# Patient Record
Sex: Female | Born: 1945 | Race: Black or African American | Hispanic: Refuse to answer | Smoking: Never smoker
Health system: Southern US, Community
[De-identification: ages and names within clinical notes are randomized; demographics above are authoritative.]

## PROBLEM LIST (undated history)

## (undated) DIAGNOSIS — R7303 Prediabetes: Secondary | ICD-10-CM

## (undated) DIAGNOSIS — I10 Essential (primary) hypertension: Secondary | ICD-10-CM

## (undated) DIAGNOSIS — M1990 Unspecified osteoarthritis, unspecified site: Secondary | ICD-10-CM

## (undated) DIAGNOSIS — E669 Obesity, unspecified: Secondary | ICD-10-CM

## (undated) DIAGNOSIS — E063 Autoimmune thyroiditis: Secondary | ICD-10-CM

## (undated) DIAGNOSIS — E785 Hyperlipidemia, unspecified: Secondary | ICD-10-CM

## (undated) DIAGNOSIS — E079 Disorder of thyroid, unspecified: Secondary | ICD-10-CM

## (undated) DIAGNOSIS — E039 Hypothyroidism, unspecified: Secondary | ICD-10-CM

## (undated) HISTORY — DX: Essential (primary) hypertension: I10

## (undated) HISTORY — DX: Unspecified osteoarthritis, unspecified site: M19.90

## (undated) HISTORY — DX: Hyperlipidemia, unspecified: E78.5

## (undated) HISTORY — DX: Obesity, unspecified: E66.9

## (undated) HISTORY — DX: Prediabetes: R73.03

## (undated) HISTORY — DX: Disorder of thyroid, unspecified: E07.9

## (undated) HISTORY — DX: Autoimmune thyroiditis: E06.3

---

## 1997-03-20 HISTORY — PX: OTHER SURGICAL HISTORY: SHX169

## 1998-03-20 HISTORY — PX: OTHER SURGICAL HISTORY: SHX169

## 2000-07-11 ENCOUNTER — Ambulatory Visit (HOSPITAL_COMMUNITY): Admission: RE | Admit: 2000-07-11 | Discharge: 2000-07-11 | Payer: Self-pay | Admitting: General Surgery

## 2001-02-19 ENCOUNTER — Other Ambulatory Visit: Admission: RE | Admit: 2001-02-19 | Discharge: 2001-02-19 | Payer: Self-pay | Admitting: Family Medicine

## 2001-02-26 ENCOUNTER — Encounter: Payer: Self-pay | Admitting: Family Medicine

## 2001-02-26 ENCOUNTER — Ambulatory Visit (HOSPITAL_COMMUNITY): Admission: RE | Admit: 2001-02-26 | Discharge: 2001-02-26 | Payer: Self-pay | Admitting: Family Medicine

## 2001-10-01 ENCOUNTER — Encounter: Payer: Self-pay | Admitting: Family Medicine

## 2001-10-01 ENCOUNTER — Ambulatory Visit (HOSPITAL_COMMUNITY): Admission: RE | Admit: 2001-10-01 | Discharge: 2001-10-01 | Payer: Self-pay | Admitting: Family Medicine

## 2002-03-04 ENCOUNTER — Encounter: Payer: Self-pay | Admitting: Family Medicine

## 2002-03-04 ENCOUNTER — Ambulatory Visit (HOSPITAL_COMMUNITY): Admission: RE | Admit: 2002-03-04 | Discharge: 2002-03-04 | Payer: Self-pay | Admitting: Family Medicine

## 2003-04-21 ENCOUNTER — Ambulatory Visit (HOSPITAL_COMMUNITY): Admission: RE | Admit: 2003-04-21 | Discharge: 2003-04-21 | Payer: Self-pay | Admitting: Family Medicine

## 2003-12-08 ENCOUNTER — Ambulatory Visit (HOSPITAL_COMMUNITY): Admission: RE | Admit: 2003-12-08 | Discharge: 2003-12-08 | Payer: Self-pay | Admitting: Family Medicine

## 2004-03-31 ENCOUNTER — Ambulatory Visit: Payer: Self-pay | Admitting: Family Medicine

## 2004-04-01 ENCOUNTER — Ambulatory Visit (HOSPITAL_COMMUNITY): Admission: RE | Admit: 2004-04-01 | Discharge: 2004-04-01 | Payer: Self-pay | Admitting: Family Medicine

## 2004-04-04 ENCOUNTER — Ambulatory Visit: Payer: Self-pay | Admitting: Family Medicine

## 2004-04-22 ENCOUNTER — Ambulatory Visit (HOSPITAL_COMMUNITY): Admission: RE | Admit: 2004-04-22 | Discharge: 2004-04-22 | Payer: Self-pay | Admitting: Family Medicine

## 2004-10-04 ENCOUNTER — Ambulatory Visit: Payer: Self-pay | Admitting: Family Medicine

## 2004-10-06 ENCOUNTER — Ambulatory Visit (HOSPITAL_COMMUNITY): Admission: RE | Admit: 2004-10-06 | Discharge: 2004-10-06 | Payer: Self-pay | Admitting: Family Medicine

## 2004-10-10 ENCOUNTER — Ambulatory Visit (HOSPITAL_COMMUNITY): Admission: RE | Admit: 2004-10-10 | Discharge: 2004-10-10 | Payer: Self-pay | Admitting: Family Medicine

## 2004-11-07 ENCOUNTER — Ambulatory Visit: Payer: Self-pay | Admitting: Family Medicine

## 2005-04-10 ENCOUNTER — Ambulatory Visit: Payer: Self-pay | Admitting: Family Medicine

## 2005-04-25 ENCOUNTER — Ambulatory Visit (HOSPITAL_COMMUNITY): Admission: RE | Admit: 2005-04-25 | Discharge: 2005-04-25 | Payer: Self-pay | Admitting: Family Medicine

## 2005-05-24 ENCOUNTER — Ambulatory Visit (HOSPITAL_COMMUNITY): Admission: RE | Admit: 2005-05-24 | Discharge: 2005-05-24 | Payer: Self-pay | Admitting: Family Medicine

## 2005-07-17 ENCOUNTER — Ambulatory Visit: Payer: Self-pay | Admitting: Family Medicine

## 2005-07-17 ENCOUNTER — Ambulatory Visit (HOSPITAL_COMMUNITY): Admission: RE | Admit: 2005-07-17 | Discharge: 2005-07-17 | Payer: Self-pay | Admitting: Family Medicine

## 2005-08-29 ENCOUNTER — Encounter (HOSPITAL_COMMUNITY): Admission: RE | Admit: 2005-08-29 | Discharge: 2005-09-28 | Payer: Self-pay | Admitting: Endocrinology

## 2005-10-06 ENCOUNTER — Ambulatory Visit: Payer: Self-pay | Admitting: Family Medicine

## 2005-11-10 ENCOUNTER — Ambulatory Visit: Payer: Self-pay | Admitting: Family Medicine

## 2006-01-24 ENCOUNTER — Ambulatory Visit: Payer: Self-pay | Admitting: Family Medicine

## 2006-05-23 ENCOUNTER — Ambulatory Visit (HOSPITAL_COMMUNITY): Admission: RE | Admit: 2006-05-23 | Discharge: 2006-05-23 | Payer: Self-pay | Admitting: Family Medicine

## 2006-05-30 ENCOUNTER — Encounter: Payer: Self-pay | Admitting: Family Medicine

## 2006-05-30 LAB — CONVERTED CEMR LAB
AST: 15 units/L (ref 0–37)
Alkaline Phosphatase: 185 units/L — ABNORMAL HIGH (ref 39–117)
BUN: 18 mg/dL (ref 6–23)
Bilirubin, Direct: 0.2 mg/dL (ref 0.0–0.3)
CO2: 25 meq/L (ref 19–32)
Calcium: 9.5 mg/dL (ref 8.4–10.5)
Chloride: 106 meq/L (ref 96–112)
Cholesterol: 183 mg/dL (ref 0–200)
Creatinine, Ser: 0.89 mg/dL (ref 0.40–1.50)
Eosinophils Absolute: 0.2 10*3/uL (ref 0.0–0.7)
Glucose, Bld: 87 mg/dL (ref 70–99)
Hemoglobin: 13.4 g/dL (ref 13.0–17.0)
Indirect Bilirubin: 0.7 mg/dL (ref 0.0–0.9)
Lymphocytes Relative: 48 % — ABNORMAL HIGH (ref 12–46)
Lymphs Abs: 2.5 10*3/uL (ref 0.7–3.3)
Monocytes Relative: 12 % — ABNORMAL HIGH (ref 3–11)
Neutro Abs: 2 10*3/uL (ref 1.7–7.7)
Platelets: 293 10*3/uL (ref 150–400)
RDW: 15.4 % — ABNORMAL HIGH (ref 11.5–14.0)
Total Bilirubin: 0.9 mg/dL (ref 0.3–1.2)
Total CHOL/HDL Ratio: 2.7
Total Protein: 6.7 g/dL (ref 6.0–8.3)
WBC: 5.3 10*3/uL (ref 4.0–10.5)

## 2006-06-06 ENCOUNTER — Other Ambulatory Visit: Admission: RE | Admit: 2006-06-06 | Discharge: 2006-06-06 | Payer: Self-pay | Admitting: Family Medicine

## 2006-06-06 ENCOUNTER — Ambulatory Visit: Payer: Self-pay | Admitting: Family Medicine

## 2006-06-13 ENCOUNTER — Ambulatory Visit (HOSPITAL_COMMUNITY): Admission: RE | Admit: 2006-06-13 | Discharge: 2006-06-13 | Payer: Self-pay | Admitting: Family Medicine

## 2006-11-21 ENCOUNTER — Encounter: Payer: Self-pay | Admitting: Family Medicine

## 2006-11-21 LAB — CONVERTED CEMR LAB
AST: 15 units/L (ref 0–37)
Albumin: 4 g/dL (ref 3.5–5.2)
Alkaline Phosphatase: 151 units/L — ABNORMAL HIGH (ref 39–117)
BUN: 17 mg/dL (ref 6–23)
CO2: 24 meq/L (ref 19–32)
Cholesterol: 183 mg/dL (ref 0–200)
Glucose, Bld: 78 mg/dL (ref 70–99)
HDL: 62 mg/dL (ref >39)
Indirect Bilirubin: 0.7 mg/dL (ref 0.0–0.9)
Total Protein: 6.9 g/dL (ref 6.0–8.3)
Triglycerides: 47 mg/dL (ref <150)
VLDL: 9 mg/dL (ref 0–40)

## 2006-11-28 ENCOUNTER — Ambulatory Visit: Payer: Self-pay | Admitting: Family Medicine

## 2007-01-23 ENCOUNTER — Ambulatory Visit: Payer: Self-pay | Admitting: Family Medicine

## 2007-03-21 ENCOUNTER — Encounter: Payer: Self-pay | Admitting: Family Medicine

## 2007-05-24 ENCOUNTER — Ambulatory Visit (HOSPITAL_COMMUNITY): Admission: RE | Admit: 2007-05-24 | Discharge: 2007-05-24 | Payer: Self-pay | Admitting: Family Medicine

## 2007-06-12 ENCOUNTER — Encounter: Payer: Self-pay | Admitting: Family Medicine

## 2007-06-12 LAB — CONVERTED CEMR LAB
Basophils Relative: 0 % (ref 0–1)
Bilirubin, Direct: 0.2 mg/dL (ref 0.0–0.3)
Calcium: 9.4 mg/dL (ref 8.4–10.5)
Creatinine, Ser: 1.01 mg/dL (ref 0.40–1.50)
Eosinophils Relative: 3 % (ref 0–5)
LDL Cholesterol: 107 mg/dL — ABNORMAL HIGH (ref 0–99)
Monocytes Absolute: 0.7 10*3/uL (ref 0.1–1.0)
Monocytes Relative: 14 % — ABNORMAL HIGH (ref 3–12)
Neutro Abs: 2.1 10*3/uL (ref 1.7–7.7)
Neutrophils Relative %: 40 % — ABNORMAL LOW (ref 43–77)
RBC: 4.95 M/uL (ref 4.22–5.81)
Total Bilirubin: 0.9 mg/dL (ref 0.3–1.2)
Total CHOL/HDL Ratio: 2.7
Triglycerides: 58 mg/dL (ref <150)
VLDL: 12 mg/dL (ref 0–40)
WBC: 5.2 10*3/uL (ref 4.0–10.5)

## 2007-06-19 ENCOUNTER — Ambulatory Visit: Payer: Self-pay | Admitting: Family Medicine

## 2007-08-26 ENCOUNTER — Ambulatory Visit: Payer: Self-pay | Admitting: Family Medicine

## 2007-08-30 ENCOUNTER — Encounter: Payer: Self-pay | Admitting: Family Medicine

## 2007-08-30 DIAGNOSIS — E079 Disorder of thyroid, unspecified: Secondary | ICD-10-CM | POA: Insufficient documentation

## 2007-08-30 DIAGNOSIS — I10 Essential (primary) hypertension: Secondary | ICD-10-CM | POA: Insufficient documentation

## 2007-08-30 DIAGNOSIS — E785 Hyperlipidemia, unspecified: Secondary | ICD-10-CM | POA: Insufficient documentation

## 2007-08-30 DIAGNOSIS — E663 Overweight: Secondary | ICD-10-CM | POA: Insufficient documentation

## 2007-10-29 ENCOUNTER — Ambulatory Visit: Payer: Self-pay | Admitting: Family Medicine

## 2007-10-29 LAB — CONVERTED CEMR LAB
ALT: 23 units/L (ref 0–53)
Albumin: 4.4 g/dL (ref 3.5–5.2)
BUN: 14 mg/dL (ref 6–23)
CO2: 26 meq/L (ref 19–32)
Chloride: 103 meq/L (ref 96–112)
Glucose, Bld: 89 mg/dL (ref 70–99)
Indirect Bilirubin: 0.6 mg/dL (ref 0.0–0.9)
Potassium: 4.5 meq/L (ref 3.5–5.3)
Total CHOL/HDL Ratio: 2.2
Total Protein: 7.3 g/dL (ref 6.0–8.3)
Triglycerides: 49 mg/dL (ref <150)
VLDL: 10 mg/dL (ref 0–40)

## 2007-11-05 ENCOUNTER — Encounter: Payer: Self-pay | Admitting: Family Medicine

## 2007-12-24 ENCOUNTER — Ambulatory Visit: Payer: Self-pay | Admitting: Family Medicine

## 2007-12-24 DIAGNOSIS — J209 Acute bronchitis, unspecified: Secondary | ICD-10-CM

## 2008-03-02 ENCOUNTER — Other Ambulatory Visit: Admission: RE | Admit: 2008-03-02 | Discharge: 2008-03-02 | Payer: Self-pay | Admitting: Family Medicine

## 2008-03-02 ENCOUNTER — Encounter: Payer: Self-pay | Admitting: Family Medicine

## 2008-03-02 ENCOUNTER — Ambulatory Visit: Payer: Self-pay | Admitting: Family Medicine

## 2008-03-02 LAB — CONVERTED CEMR LAB: OCCULT 1: NEGATIVE

## 2008-06-16 ENCOUNTER — Ambulatory Visit (HOSPITAL_COMMUNITY): Admission: RE | Admit: 2008-06-16 | Discharge: 2008-06-16 | Payer: Self-pay | Admitting: Family Medicine

## 2008-07-20 ENCOUNTER — Ambulatory Visit: Payer: Self-pay | Admitting: Family Medicine

## 2008-07-20 DIAGNOSIS — R609 Edema, unspecified: Secondary | ICD-10-CM | POA: Insufficient documentation

## 2008-07-22 ENCOUNTER — Encounter: Payer: Self-pay | Admitting: Family Medicine

## 2008-07-22 ENCOUNTER — Telehealth: Payer: Self-pay | Admitting: Family Medicine

## 2008-07-23 LAB — CONVERTED CEMR LAB
BUN: 21 mg/dL (ref 6–23)
Bilirubin, Direct: 0.2 mg/dL (ref 0.0–0.3)
Calcium: 9.4 mg/dL (ref 8.4–10.5)
Chloride: 108 meq/L (ref 96–112)
Cholesterol: 179 mg/dL (ref 0–200)
Eosinophils Absolute: 0.1 10*3/uL (ref 0.0–0.7)
MCV: 80.9 fL (ref 78.0–100.0)
Monocytes Relative: 14 % — ABNORMAL HIGH (ref 3–12)
Platelets: 322 10*3/uL (ref 150–400)
RBC: 4.93 M/uL (ref 3.87–5.11)
Total Bilirubin: 1 mg/dL (ref 0.3–1.2)
Total CHOL/HDL Ratio: 2.6
Triglycerides: 70 mg/dL (ref <150)
VLDL: 14 mg/dL (ref 0–40)
WBC: 5.2 10*3/uL (ref 4.0–10.5)

## 2008-12-21 ENCOUNTER — Ambulatory Visit: Payer: Self-pay | Admitting: Family Medicine

## 2008-12-21 DIAGNOSIS — G4700 Insomnia, unspecified: Secondary | ICD-10-CM | POA: Insufficient documentation

## 2008-12-29 LAB — CONVERTED CEMR LAB
ALT: 18 units/L (ref 0–35)
BUN: 20 mg/dL (ref 6–23)
Calcium: 9.5 mg/dL (ref 8.4–10.5)
Chloride: 106 meq/L (ref 96–112)
Cholesterol: 175 mg/dL (ref 0–200)
Glucose, Bld: 85 mg/dL (ref 70–99)
LDL Cholesterol: 90 mg/dL (ref 0–99)
Total Bilirubin: 1.1 mg/dL (ref 0.3–1.2)
Total Protein: 6.6 g/dL (ref 6.0–8.3)
Triglycerides: 55 mg/dL (ref <150)

## 2009-03-01 ENCOUNTER — Encounter: Payer: Self-pay | Admitting: Family Medicine

## 2009-04-14 ENCOUNTER — Telehealth: Payer: Self-pay | Admitting: Family Medicine

## 2009-04-16 LAB — CONVERTED CEMR LAB
Chloride: 104 meq/L (ref 96–112)
HDL: 76 mg/dL (ref >39)
LDL Cholesterol: 132 mg/dL — ABNORMAL HIGH (ref 0–99)
Potassium: 4.7 meq/L (ref 3.5–5.3)
Sodium: 140 meq/L (ref 135–145)
Triglycerides: 61 mg/dL (ref <150)
VLDL: 12 mg/dL (ref 0–40)

## 2009-04-19 ENCOUNTER — Ambulatory Visit: Payer: Self-pay | Admitting: Family Medicine

## 2009-04-19 ENCOUNTER — Other Ambulatory Visit: Admission: RE | Admit: 2009-04-19 | Discharge: 2009-04-19 | Payer: Self-pay | Admitting: Family Medicine

## 2009-04-19 LAB — CONVERTED CEMR LAB: Pap Smear: NEGATIVE

## 2009-04-20 ENCOUNTER — Telehealth: Payer: Self-pay | Admitting: Physician Assistant

## 2009-05-03 ENCOUNTER — Ambulatory Visit: Payer: Self-pay | Admitting: Family Medicine

## 2009-05-03 DIAGNOSIS — H6120 Impacted cerumen, unspecified ear: Secondary | ICD-10-CM | POA: Insufficient documentation

## 2009-06-22 ENCOUNTER — Ambulatory Visit (HOSPITAL_COMMUNITY): Admission: RE | Admit: 2009-06-22 | Discharge: 2009-06-22 | Payer: Self-pay | Admitting: Family Medicine

## 2009-09-23 ENCOUNTER — Encounter: Payer: Self-pay | Admitting: Family Medicine

## 2009-09-23 LAB — CONVERTED CEMR LAB
Albumin: 4.4 g/dL (ref 3.5–5.2)
Alkaline Phosphatase: 98 units/L (ref 39–117)
BUN: 18 mg/dL (ref 6–23)
CO2: 20 meq/L (ref 19–32)
Calcium: 9.6 mg/dL (ref 8.4–10.5)
Cholesterol: 182 mg/dL (ref 0–200)
Creatinine, Ser: 0.87 mg/dL (ref 0.40–1.20)
HDL: 74 mg/dL (ref >39)
Indirect Bilirubin: 0.8 mg/dL (ref 0.0–0.9)
Total Bilirubin: 1 mg/dL (ref 0.3–1.2)
VLDL: 11 mg/dL (ref 0–40)

## 2009-09-24 ENCOUNTER — Encounter: Payer: Self-pay | Admitting: Family Medicine

## 2009-09-28 ENCOUNTER — Ambulatory Visit: Payer: Self-pay | Admitting: Family Medicine

## 2009-09-28 DIAGNOSIS — R5381 Other malaise: Secondary | ICD-10-CM | POA: Insufficient documentation

## 2009-09-28 DIAGNOSIS — L0390 Cellulitis, unspecified: Secondary | ICD-10-CM

## 2009-09-28 DIAGNOSIS — R5383 Other fatigue: Secondary | ICD-10-CM

## 2009-09-28 DIAGNOSIS — L0291 Cutaneous abscess, unspecified: Secondary | ICD-10-CM | POA: Insufficient documentation

## 2010-02-02 ENCOUNTER — Ambulatory Visit: Payer: Self-pay | Admitting: Family Medicine

## 2010-02-02 ENCOUNTER — Ambulatory Visit (HOSPITAL_COMMUNITY): Admission: RE | Admit: 2010-02-02 | Discharge: 2010-02-02 | Payer: Self-pay | Admitting: Family Medicine

## 2010-02-02 DIAGNOSIS — M549 Dorsalgia, unspecified: Secondary | ICD-10-CM | POA: Insufficient documentation

## 2010-02-02 DIAGNOSIS — M779 Enthesopathy, unspecified: Secondary | ICD-10-CM | POA: Insufficient documentation

## 2010-02-02 HISTORY — DX: Enthesopathy, unspecified: M77.9

## 2010-02-02 HISTORY — DX: Dorsalgia, unspecified: M54.9

## 2010-02-04 ENCOUNTER — Telehealth: Payer: Self-pay | Admitting: Family Medicine

## 2010-03-23 ENCOUNTER — Encounter: Payer: Self-pay | Admitting: Family Medicine

## 2010-03-29 ENCOUNTER — Encounter: Payer: Self-pay | Admitting: Family Medicine

## 2010-04-10 ENCOUNTER — Encounter: Payer: Self-pay | Admitting: Family Medicine

## 2010-04-18 ENCOUNTER — Ambulatory Visit: Admit: 2010-04-18 | Payer: Self-pay | Admitting: Family Medicine

## 2010-04-19 NOTE — Letter (Signed)
Summary: demographic  demographic   Imported By: Curtis Sites 09/08/2009 11:52:11  _____________________________________________________________________  External Attachment:    Type:   Image     Comment:   External Document

## 2010-04-19 NOTE — Letter (Signed)
Summary: xray  xray   Imported By: Curtis Sites 09/08/2009 11:54:40  _____________________________________________________________________  External Attachment:    Type:   Image     Comment:   External Document

## 2010-04-19 NOTE — Assessment & Plan Note (Signed)
Summary: office visit   Vital Signs:  Patient profile:   65 year old female Menstrual status:  postmenopausal Height:      62.5 inches Weight:      187.75 pounds BMI:     33.91 O2 Sat:      98 % Pulse rate:   77 / minute Pulse rhythm:   regular Resp:     16 per minute BP sitting:   148 / 88  (left arm) Cuff size:   large  Vitals Entered By: Everitt Amber LPN (September 28, 2009 10:29 AM)  Nutrition Counseling: Patient's BMI is greater than 25 and therefore counseled on weight management options. CC: noticed a bump that was itching on her left arm Saturday, then the area turned dark and spread and she doesn't know what it is from   Primary Care Provider:  Syliva Overman MD  CC:  noticed a bump that was itching on her left arm Saturday and then the area turned dark and spread and she doesn't know what it is from.  History of Present Illness: Reports  that sh ahs been doing fairly well. Denies recent fever or chills. Denies sinus pressure, nasal congestion , ear pain or sore throat. Denies chest congestion, or cough productive of sputum. Denies chest pain, palpitations, PND, orthopnea or leg swelling. Denies abdominal pain, nausea, vomitting, diarrhea or constipation. Denies change in bowel movements or bloody stool. Denies dysuria , frequency, incontinence or hesitancy. Denies  joint pain, swelling, or reduced mobility. Denies headaches, vertigo, seizures. Denies depression, anxiety or insomnia. c/o dark lesion on left forearm inc in size over the last 3 to4 days. Uncertain if due to insect bite or trauma, no recollection of either. she is also here to review recent lab data which is excellent     Current Medications (verified): 1)  Aspirin 81 Mg  Tabs (Aspirin) .... Take 1 Tablet By Mouth Once A Day 2)  Oyster Shell Calcium + D 500-400 Mg-Unit  Tabs (Calcium-Vitamin D) .... Take 1 Tablet By Mouth Three Times A Day 3)  Nicardipine Hcl 30 Mg  Caps (Nicardipine Hcl) .... Take 1  Tablet By Mouth Two Times A Day 4)  Moexipril-Hydrochlorothiazide 15-25 Mg  Tabs (Moexipril-Hydrochlorothiazide) .... Take 1 Tablet By Mouth Once A Day 5)  Simvastatin 40 Mg  Tabs (Simvastatin) .... Take 1 Tab By Mouth At Bedtime 6)  Klor-Con M20 20 Meq Cr-Tabs (Potassium Chloride Crys Cr) .... Take 1 Tablet By Mouth Two Times A Day For 3 Days Then 1 Daily As Needed 7)  Restoril 15 Mg Caps (Temazepam) .... Take 1 Capsule By Mouth At Bedtime  Allergies (verified): No Known Drug Allergies  Review of Systems      See HPI General:  Complains of fatigue. Eyes:  Denies blurring, discharge, eye pain, and red eye. Derm:  Complains of lesion(s); tender dark swelling on left forearm x 4 days, thinks she had insect bite, no fever or chills.No trauma otherwise. Endo:  Denies cold intolerance, excessive thirst, excessive urination, heat intolerance, and weight change. Heme:  Denies abnormal bruising and bleeding. Allergy:  Complains of seasonal allergies; denies hives or rash and itching eyes.  Physical Exam  General:  Well-developed,well-nourished,in no acute distress; alert,appropriate and cooperative throughout examination HEENT: No facial asymmetry,  EOMI, No sinus tenderness, TM's Clear, oropharynx  pink and moist.   Chest: Clear to auscultation bilaterally.  CVS: S1, S2, No murmurs, No S3.   Abd: Soft, Nontender.  MS: Adequate ROM spine, hips,  shoulders and knees.  Ext: No edema.   CNS: CN 2-12 intact, power tone and sensation normal throughout.   Skin: Intact, bruise to left forearm, max dia approcx 4 cm , no punctum noted.no purulent drainage Psych: Good eye contact, normal affect.  Memory intact, not anxious or depressed appearing.    Impression & Recommendations:  Problem # 1:  CELLULITIS (ICD-682.9) Assessment Comment Only  The following medications were removed from the medication list:    Penicillin V Potassium 500 Mg Tabs (Penicillin v potassium) .Marland Kitchen... Take 1 tablet by mouth  three times a day Her updated medication list for this problem includes:    Doxycycline Hyclate 100 Mg Caps (Doxycycline hyclate) .Marland Kitchen... Take 1 capsule by mouth two times a day  Problem # 2:  FATIGUE (ICD-780.79) Assessment: Deteriorated  Orders: T-CBC w/Diff ([id number])  Problem # 3:  OBESITY (ICD-278.00) Assessment: Unchanged  Ht: 62.5 (09/28/2009)   Wt: 187.75 (09/28/2009)   BMI: 33.91 (09/28/2009)  Problem # 4:  HYPERTENSION (ICD-401.9) Assessment: Deteriorated  Her updated medication list for this problem includes:    Nicardipine Hcl 30 Mg Caps (Nicardipine hcl) .Marland Kitchen... Take 1 tablet by mouth two times a day    Moexipril-hydrochlorothiazide 15-25 Mg Tabs (Moexipril-hydrochlorothiazide) .Marland Kitchen... Take 1 tablet by mouth once a day  Orders: T-Basic Metabolic Panel [id number])  BP today: 148/88 Prior BP: 120/80 (04/19/2009)  Labs Reviewed: K+: 4.6 (09/23/2009) Creat: : 0.87 (09/23/2009)   Chol: 182 (09/22/2009)   HDL: 74 (09/22/2009)   LDL: 97 (09/22/2009)   TG: 57 (09/22/2009)  Problem # 5:  HYPERLIPIDEMIA (ICD-272.4) Assessment: Improved  Her updated medication list for this problem includes:    Simvastatin 40 Mg Tabs (Simvastatin) .Marland Kitchen... Take 1 tab by mouth at bedtime  Orders: T-Hepatic Function [id number]) T-Lipid Profile [id number])  Labs Reviewed: SGOT: 19 (09/23/2009)   SGPT: 13 (09/23/2009)   HDL:74 (09/22/2009), 76 (04/16/2009)  LDL:97 (09/22/2009), 132 (44/03/270)  Chol:182 (09/22/2009), 220 (04/16/2009)  Trig:57 (09/22/2009), 61 (04/16/2009)  Complete Medication List: 1)  Aspirin 81 Mg Tabs (Aspirin) .... Take 1 tablet by mouth once a day 2)  Oyster Shell Calcium + D 500-400 Mg-unit Tabs (Calcium-vitamin d) .... Take 1 tablet by mouth three times a day 3)  Nicardipine Hcl 30 Mg Caps (Nicardipine hcl) .... Take 1 tablet by mouth two times a day 4)  Moexipril-hydrochlorothiazide 15-25 Mg Tabs (Moexipril-hydrochlorothiazide) .... Take 1 tablet by mouth once  a day 5)  Simvastatin 40 Mg Tabs (Simvastatin) .... Take 1 tab by mouth at bedtime 6)  Klor-con M20 20 Meq Cr-tabs (Potassium chloride crys cr) .... Take 1 tablet by mouth two times a day for 3 days then 1 daily as needed 7)  Restoril 15 Mg Caps (Temazepam) .... Take 1 capsule by mouth at bedtime 8)  Doxycycline Hyclate 100 Mg Caps (Doxycycline hyclate) .... Take 1 capsule by mouth two times a day  Patient Instructions: 1)  CPE due Apr 20, 2010 or after. 2)  It is important that you exercise regularly at least 20 minutes 5 times a week. If you develop chest pain, have severe difficulty breathing, or feel very tired , stop exercising immediately and seek medical attention. 3)  You need to lose weight. Consider a lower calorie diet and regular exercise.  4)  BMP prior to visit, ICD-9: 5)  Hepatic Panel prior to visit, ICD-9: fasting end Jan  6)  Lipid Panel prior to visit, ICD-9: 7)  CBC w/ Diff prior to visit, ICD-9: 8)  Your  labs are great , no med change  9)  Med is sent in for the rash on your arm if no better or worsening in 2 days pls call for a referral Prescriptions: DOXYCYCLINE HYCLATE 100 MG CAPS (DOXYCYCLINE HYCLATE) Take 1 capsule by mouth two times a day  #14 x 0   Entered and Authorized by:   Syliva Overman MD   Signed by:   Syliva Overman MD on 09/28/2009   Method used:   Electronically to        [facility] (retail)       [address] [place], [place]  [address]       Ph: [phone]       Fax: [phone]   RxID:   [id number]

## 2010-04-19 NOTE — Letter (Signed)
Summary: misc  misc   Imported By: Curtis Sites 09/08/2009 11:53:17  _____________________________________________________________________  External Attachment:    Type:   Image     Comment:   External Document

## 2010-04-19 NOTE — Letter (Signed)
Summary: progress notes  progress notes   Imported By: Curtis Sites 09/08/2009 11:54:20  _____________________________________________________________________  External Attachment:    Type:   Image     Comment:   External Document

## 2010-04-19 NOTE — Progress Notes (Signed)
  Phone Note Call from Patient   Caller: Patient Summary of Call: states area where she had zostavax is itching and a lil knot. advised sounds like a minor local reaction. advised to call office if because warm to touch or severe itching.  patient agrees Initial call taken by: Worthy Keeler LPN,  April 20, 2009 4:17 PM  Follow-up for Phone Call        Noted. Follow-up by: Esperanza Sheets PA,  April 20, 2009 4:21 PM

## 2010-04-19 NOTE — Progress Notes (Signed)
Summary: explian medicine  Phone Note Call from Patient   Summary of Call: needs someone to call her and explain her medicine to her call at [phone] Initial call taken by: Lind Guest,  February 04, 2010 12:34 PM  Follow-up for Phone Call        Phone Call Completed, patient had question about prednisone Follow-up by: Adella Hare LPN,  February 04, 2010 1:09 PM

## 2010-04-19 NOTE — Letter (Signed)
Summary: history and physical  history and physical   Imported By: Curtis Sites 09/08/2009 11:52:38  _____________________________________________________________________  External Attachment:    Type:   Image     Comment:   External Document

## 2010-04-19 NOTE — Letter (Signed)
Summary: consult  consult   Imported By: Curtis Sites 09/08/2009 11:51:38  _____________________________________________________________________  External Attachment:    Type:   Image     Comment:   External Document

## 2010-04-19 NOTE — Letter (Signed)
Summary: phone  phone   Imported By: Curtis Sites 09/08/2009 11:53:33  _____________________________________________________________________  External Attachment:    Type:   Image     Comment:   External Document

## 2010-04-19 NOTE — Progress Notes (Signed)
Summary: LAB ORDER  Phone Note Call from Patient   Summary of Call: NEEDS HER LAB ORDER SENT  OVER TO THE LAB Initial call taken by: Lind Guest,  April 14, 2009 4:22 PM  Follow-up for Phone Call        lab order sent Follow-up by: Worthy Keeler LPN,  April 14, 2009 5:01 PM

## 2010-04-19 NOTE — Assessment & Plan Note (Signed)
Summary: office visit   Vital Signs:  Patient profile:   65 year old female Menstrual status:  postmenopausal Height:      62.5 inches Weight:      185.75 pounds BMI:     33.55 O2 Sat:      100 % on Room air Pulse rate:   74 / minute Pulse rhythm:   regular Resp:     16 per minute BP sitting:   120 / 80  (left arm) Cuff size:   large  Vitals Entered By: Everitt Amber (April 19, 2009 9:08 AM)  Nutrition Counseling: Patient's BMI is greater than 25 and therefore counseled on weight management options.  O2 Flow:  Room air CC: Follow up chronic problems Is Patient Diabetic? No  Vision Screening:      Vision Comments: Patient refused the vision testing because she forgot her glasses and couldn't see the board good  Vision Entered By: Everitt Amber (April 19, 2009 10:22 AM)   Primary Care Provider:  Syliva Overman MD  CC:  Follow up chronic problems.  History of Present Illness: Reports  that they are doing well. Denies recent fever or chills. Denies sinus pressure, nasal congestion , ear pain or sore throat. Denies chest congestion, or cough productive of sputum. Denies chest pain, palpitations, PND, orthopnea or leg swelling. Denies abdominal pain, nausea, vomitting, diarrhea or constipation. Denies change in bowel movements or bloody stool. Denies dysuria , frequency, incontinence or hesitancy. Denies  joint pain, swelling, or reduced mobility. Denies headaches, vertigo, seizures. Denies depression or  anxiety , but is suffering from insomnia and sleep deprivation. Denies  rash, lesions, or itch.     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 Take 1 Tablet By Mouth Once A Day 2)  Oyster Shell Calcium + D 500-400 Mg-Unit  Tabs (Calcium-Vitamin D) .... Take 1 Tablet By Mouth Three Times A Day 3)  Nicardipine Hcl 30 Mg  Caps (Nicardipine Hcl) .... Take 1 Tablet By Mouth Two Times A Day 4)  Moexipril-Hydrochlorothiazide 15-25 Mg  Tabs (Moexipril-Hydrochlorothiazide) .... Take 1 Tablet By Mouth Once A Day 5)  Simvastatin 40 Mg  Tabs (Simvastatin) .... Take 1 Tab By Mouth At Bedtime 6)  Klor-Con M20 20 Meq Cr-Tabs (Potassium Chloride Crys Cr) .... Take 1 Tablet By Mouth Two Times A Day For 3 Days Then 1 Daily As Needed 7)  Restoril 30 Mg Caps (Temazepam) .... Take 1 Capsule By Mouth At Bedtime  Allergies (verified): No Known Drug Allergies  Review of Systems      See HPI Eyes:  Denies blurring, discharge, double vision, and red eye. Neuro:  Denies headaches, seizures, and sensation of room spinning. Endo:  Denies cold intolerance, excessive hunger, excessive thirst, excessive urination, heat intolerance, polyuria, and weight change. Heme:  Denies abnormal bruising and bleeding. Allergy:  Denies hives or rash and sneezing.  Physical Exam  General:  Well-developed,obese,in no acute distress; alert,appropriate and cooperative throughout examination Head:  Normocephalic and atraumatic without obvious abnormalities. No apparent alopecia or balding. Eyes:  No corneal or conjunctival inflammation noted. EOMI. Perrla. Funduscopic  exam benign, without hemorrhages, exudates or papilledema. Vision grossly normal. Ears:  R ear normal, L ear normal, and no external deformities.  right TM OCCLUDED , LEFT PARTIALLY OCCLUDED Nose:  External nasal examination shows no deformity or inflammation. Nasal mucosa are pink and moist without lesions or exudates. Mouth:  Oral mucosa and oropharynx without lesions or exudates.  Teeth in good repair. Neck:  No deformities,  masses, or tenderness noted. Chest Wall:  No deformities, masses, or tenderness noted. Breasts:  No mass, nodules, thickening, tenderness, bulging, retraction, inflamation, nipple discharge or skin changes noted.   Lungs:  Normal respiratory effort, chest expands symmetrically. Lungs are clear to auscultation, no crackles or wheezes. Heart:  Normal rate and regular rhythm. S1 and S2 normal without gallop, murmur, click, rub or other extra sounds. Abdomen:  Bowel sounds positive,abdomen soft and non-tender without masses, organomegaly or hernias noted. Rectal:  No external abnormalities noted. Normal sphincter tone. No rectal masses or tenderness.guaic negative stool Genitalia:  Normal introitus for age, no external lesions, no vaginal discharge, mucosa pink and moist, no vaginal or cervical lesions, no vaginal atrophy, no friaility or hemorrhage, normal uterus size and position, no adnexal masses or tenderness Msk:  No deformity or scoliosis noted of thoracic or lumbar spine.   Pulses:  R and L carotid,radial,femoral,dorsalis pedis and posterior tibial pulses are full and equal bilaterally Extremities:  No clubbing, cyanosis, edema, or deformity noted with normal full range of motion of all joints.   Neurologic:  No cranial nerve deficits noted. Station and gait are normal. Plantar reflexes are down-going bilaterally. DTRs are symmetrical throughout. Sensory, motor and coordinative functions appear intact. Skin:  Intact without suspicious lesions or rashes Cervical Nodes:  No lymphadenopathy noted Axillary Nodes:  No palpable lymphadenopathy Inguinal Nodes:  No significant adenopathy Psych:  Cognition and judgment appear intact. Alert and cooperative with normal attention span and concentration. No apparent delusions, illusions, hallucinations   Impression & Recommendations:  Problem # 1:  INSOMNIA (ICD-780.52) Assessment Deteriorated  The following medications were removed from the  medication list:    Restoril 30 Mg Caps (Temazepam) .Marland Kitchen... Take 1 capsule by mouth at bedtime Her updated medication list for this problem includes:    Restoril 15 Mg Caps (Temazepam) .Marland Kitchen... Take 1 capsule by mouth at bedtime  Discussed sleep hygiene.   Problem # 2:  OBESITY (ICD-278.00) Assessment: Unchanged  Ht: 62.5 (04/19/2009)   Wt: 185.75 (04/19/2009)   BMI: 33.55 (04/19/2009)  Problem # 3:  HYPERTENSION (ICD-401.9) Assessment: Improved  Her updated medication list for this problem includes:    Nicardipine Hcl 30 Mg Caps (Nicardipine hcl) .Marland Kitchen... Take 1 tablet by mouth two times a day    Moexipril-hydrochlorothiazide 15-25 Mg Tabs (Moexipril-hydrochlorothiazide) .Marland Kitchen... Take 1 tablet by mouth once a day  BP today: 120/80 Prior BP: 140/90 (12/21/2008)  Labs Reviewed: K+: 4.7 (04/16/2009) Creat: : 1.01 (04/16/2009)   Chol: 220 (04/16/2009)   HDL: 76 (04/16/2009)   LDL: 132 (04/16/2009)   TG: 61 (04/16/2009)  Problem # 4:  HYPERLIPIDEMIA (ICD-272.4) Assessment: Deteriorated  Her updated medication list for this problem includes:    Simvastatin 40 Mg Tabs (Simvastatin) .Marland Kitchen... Take 1 tab by mouth at bedtime  Orders: T-Lipid Profile [id number])  Labs Reviewed: SGOT: 17 (04/16/2009)   SGPT: 17 (04/16/2009)   HDL:76 (04/16/2009), 74 (12/28/2008)  LDL:132 (04/16/2009), 90 (14/78/2956)  Chol:220 (04/16/2009), 175 (12/28/2008)  Trig:61 (04/16/2009), 55 (12/28/2008)  Complete Medication List: 1)  Aspirin 81 Mg Tabs (Aspirin) .Marland KitchenMarland KitchenMarland Kitchen  Take 1 tablet by mouth once a day 2)  Oyster Shell Calcium + D 500-400 Mg-unit Tabs (Calcium-vitamin d) .... Take 1 tablet by mouth three times a day 3)  Nicardipine Hcl 30 Mg Caps (Nicardipine hcl) .... Take 1 tablet by mouth two times a day 4)  Moexipril-hydrochlorothiazide 15-25 Mg Tabs (Moexipril-hydrochlorothiazide) .... Take 1 tablet by mouth once a day 5)  Simvastatin 40 Mg Tabs (Simvastatin) .... Take 1 tab by mouth at bedtime 6)  Klor-con M20 20  Meq Cr-tabs (Potassium chloride crys cr) .... Take 1 tablet by mouth two times a day for 3 days then 1 daily as needed 7)  Restoril 15 Mg Caps (Temazepam) .... Take 1 capsule by mouth at bedtime  Other Orders: Radiology Referral (Radiology) Pap Smear (44010) Hemoccult Guaiac-1 spec.(in office) (82270) Zoster (Shingles) Vaccine Live ([phone]) Admin 1st Vaccine ([phone]) Admin 1st Vaccine [facility]) ([phone])  Patient Instructions: 1)  F/u in 5.5 months 2)  It is important that you exercise regularly at least 20 minutes 5 times a week. If you develop chest pain, have severe difficulty breathing, or feel very tired , stop exercising immediately and seek medical attention. 3)  You need to lose weight. Consider a lower calorie diet and regular exercise.  4)  pLs ensure you get at least 6.5 hrs of continuous sleep daily. 5)  Pls take the med every day, and practice good sleep hygiene. 6)  Pls cut back on fatty foods and snack on fresh/fruit or veg. 7)  Zostavax today Prescriptions: SIMVASTATIN 40 MG  TABS (SIMVASTATIN) Take 1 tab by mouth at bedtime  #90 x 3   Entered by:   Everitt Amber   Authorized by:   Syliva Overman MD   Signed by:   Everitt Amber on 04/19/2009   Method used:   Printed then faxed to ...       [facility] (retail)       [address]       [place], [place]  [address]       Ph: [phone]       Fax: [phone]   RxID:   [id number] NICARDIPINE HCL 30 MG  CAPS (NICARDIPINE HCL) Take 1 tablet by mouth two times a day  #180 x 3   Entered by:   Everitt Amber   Authorized by:   Syliva Overman MD   Signed by:   Everitt Amber on 04/19/2009   Method used:   Printed then faxed to ...       [facility] (retail)       [address]       [place], [place]  [address]       Ph: [phone]       Fax: [phone]   RxID:   [id number] OYSTER SHELL CALCIUM + D 500-400 MG-UNIT  TABS (CALCIUM-VITAMIN D) Take  1 tablet by mouth three times a day  #90 x 5   Entered by:   Everitt Amber   Authorized by:   Syliva Overman MD   Signed by:   Everitt Amber on 04/19/2009   Method used:   Electronically to        [facility] (retail)       [address]       [place], [place]  [address]  Ph: [phone]       Fax: [phone]   RxID:   [id number] RESTORIL 15 MG CAPS (TEMAZEPAM) Take 1 capsule by mouth at bedtime  #30 x 5   Entered and Authorized by:   Syliva Overman MD   Signed by:   Syliva Overman MD on 04/19/2009   Method used:   Printed then faxed to ...       [facility] (retail)       [address]       [place], [place]  [address]       Ph: [phone]       Fax: [phone]   RxID:   [id number]      Laboratory Results  Date/Time Received: April 19, 2009  Date/Time Reported: April 19, 2009   Stool - Occult Blood Hemmoccult #1: negative Date: 04/19/2009 Comments: [id number] 9R 8/11 118 10/12     Zostavax # 1    Vaccine Type: Zostavax    Site: left deltoid    Mfr: Merck    Dose: 0.5 ml    Route: [place]    Given by: Everitt Amber    Exp. Date: 1/12    Lot #: [id number]

## 2010-04-19 NOTE — Assessment & Plan Note (Signed)
Summary: BACK   Vital Signs:  Patient profile:   65 year old female Menstrual status:  postmenopausal Height:      62.5 inches Weight:      187.75 pounds BMI:     33.91 O2 Sat:      99 % on Room air Pulse rate:   62 / minute Pulse rhythm:   irregular Resp:     16 per minute BP sitting:   160 / 90  (left arm)  Vitals Entered By: Mauricia Area CMA (February 02, 2010 3:04 PM)  Nutrition Counseling: Patient's BMI is greater than 25 and therefore counseled on weight management options.  O2 Flow:  Room air CC: Back pain, radiate down right leg. Arthritis pain knee and left hand   Primary Care Provider:  Syliva Overman MD  CC:  Back pain and radiate down right leg. Arthritis pain knee and left hand.  History of Present Illness: chronic low back pain x 1 year, std when she std sleeping in her recliner,2 weeks  ago she experienced severe acute lBP radiating down rLE to mid thigh, intermittent , aggravated by certain movements, she denies lower extremity weakness or numbness, and also denies urinary incontinence. she also c/o pain and reduced ROM of thumb. Reports  that she has otherwise been doing well. Denies recent fever or chills. Denies sinus pressure, nasal congestion , ear pain or sore throat. Denies chest congestion, or cough productive of sputum. Denies chest pain, palpitations, PND, orthopnea or leg swelling. Denies abdominal pain, nausea, vomitting, diarrhea or constipation. Denies change in bowel movements or bloody stool. Denies dysuria , frequency, incontinence or hesitancy.  Denies headaches, vertigo, seizures. Denies depression, anxiety or insomnia. Denies  rash, lesions, or itch.     Current Medications (verified): 1)  Aspirin 81 Mg  Tabs (Aspirin) .... Take 1 Tablet By Mouth Once A Day 2)  Oyster Shell Calcium + D 500-400 Mg-Unit  Tabs (Calcium-Vitamin D) .... Take 1 Tablet By Mouth Three Times A Day 3)  Nicardipine Hcl 30 Mg  Caps (Nicardipine Hcl) ....  Take 1 Tablet By Mouth Two Times A Day 4)  Simvastatin 40 Mg  Tabs (Simvastatin) .... Take 1 Tab By Mouth At Bedtime 5)  Bayer Arthritis Pain Reliever 650 Mg .... 2 Tab By Mouth As Needed For Pain  Allergies (verified): No Known Drug Allergies  Review of Systems      See HPI General:  Complains of sleep disorder; due to back pain. Eyes:  Denies eye pain and red eye. MS:  Complains of low back pain and mid back pain. Psych:  Denies anxiety and depression. Endo:  Denies cold intolerance, excessive thirst, excessive urination, and heat intolerance. Heme:  Denies abnormal bruising and bleeding. Allergy:  Denies hives or rash and itching eyes.  Physical Exam  General:  Well-developed,well-nourished,in no acute distress; alert,appropriate and cooperative throughout examination.In pain. HEENT: No facial asymmetry,  EOMI, No sinus tenderness, TM's Clear, oropharynx  pink and moist.   Chest: Clear to auscultation bilaterally.  CVS: S1, S2, No murmurs, No S3.   Abd: Soft, Nontender.  MS: decreased  ROM spine,adequate in  hips, shoulders and knees.pain , swelling and tenderness of the left thenar eminence with reduced ROM of the thumb Ext: No edema.   CNS: CN 2-12 intact, power tone and sensation normal throughout.   Skin: Intact, no visible lesions or rashes.  Psych: Good eye contact, normal affect.  Memory intact, not anxious or depressed appearing.  Impression & Recommendations:  Problem # 1:  TENDINITIS (ICD-726.90) Assessment Comment Only antin-inflammatory meds prescribed  Problem # 2:  BACK PAIN (ICD-724.5) Assessment: Comment Only  Her updated medication list for this problem includes:    Aspirin 81 Mg Tabs (Aspirin) .Marland Kitchen... Take 1 tablet by mouth once a day    Ibuprofen 800 Mg Tabs (Ibuprofen) .Marland Kitchen... Take 1 tablet by mouth three times a day for 10 days, then one daily as needed    Cyclobenzaprine Hcl 10 Mg Tabs (Cyclobenzaprine hcl) .Marland Kitchen... Take 1 tab by mouth at bedtime , for  10 days,then as needed  Orders: Depo- Medrol 80mg  (J1040) Ketorolac-Toradol 15mg  (M0102) Admin of Therapeutic Inj  intramuscular or subcutaneous (72536) Radiology Referral (Radiology)  Problem # 3:  OBESITY (ICD-278.00) Assessment: Comment Only  Ht: 62.5 (02/02/2010)   Wt: 187.75 (02/02/2010)   BMI: 33.91 (02/02/2010) therapeutic lifestyle change discussed and encouraged  Problem # 4:  HYPERLIPIDEMIA (ICD-272.4) Assessment: Comment Only  Her updated medication list for this problem includes:    Simvastatin 40 Mg Tabs (Simvastatin) .Marland Kitchen... Take 1 tab by mouth at bedtime Low fat dietdiscussed and encouraged  Labs Reviewed: SGOT: 19 (09/23/2009)   SGPT: 13 (09/23/2009)   HDL:74 (09/22/2009), 76 (04/16/2009)  LDL:97 (09/22/2009), 132 (64/40/3474)  Chol:182 (09/22/2009), 220 (04/16/2009)  Trig:57 (09/22/2009), 61 (04/16/2009)  Complete Medication List: 1)  Aspirin 81 Mg Tabs (Aspirin) .... Take 1 tablet by mouth once a day 2)  Oyster Shell Calcium + D 500-400 Mg-unit Tabs (Calcium-vitamin d) .... Take 1 tablet by mouth three times a day 3)  Nicardipine Hcl 30 Mg Caps (Nicardipine hcl) .... Take 1 tablet by mouth two times a day 4)  Simvastatin 40 Mg Tabs (Simvastatin) .... Take 1 tab by mouth at bedtime 5)  Bayer Arthritis Pain Reliever 650 Mg  .... 2 tab by mouth as needed for pain 6)  Ibuprofen 800 Mg Tabs (Ibuprofen) .... Take 1 tablet by mouth three times a day for 10 days, then one daily as needed 7)  Cyclobenzaprine Hcl 10 Mg Tabs (Cyclobenzaprine hcl) .... Take 1 tab by mouth at bedtime , for 10 days,then as needed 8)  Prevacid 24hr 15 Mg Cpdr (Lansoprazole) .... Take 1 capsule by mouth once a day  Other Orders: Influenza Vaccine NON MCR (25956)  Patient Instructions: 1)  f/u as before. 2)    you are being treated for acute on chronic back pain which i believe is primarily due to osteoarthritis, recently aggravated by muscle strain in your low back. You have inflammation of  the tendon of your left thumb. 3)  the same med should treat both. 4)  if your symtooms persist pls call for further mx. 5)  injections in the office today and meds are also sent to your pharmacy 6)  pls check your bP weekly and return if still high earlier Prescriptions: OYSTER SHELL CALCIUM + D 500-400 MG-UNIT  TABS (CALCIUM-VITAMIN D) Take 1 tablet by mouth three times a day  #90 x 5   Entered by:   Adella Hare LPN   Authorized by:   Syliva Overman MD   Signed by:   Adella Hare LPN on 38/75/6433   Method used:   Electronically to        [facility]* (retail)       [address] [place], [place]  [address]       Ph: [phone]  Fax: [phone]   RxID:   [id number] PREVACID 24HR 15 MG CPDR (LANSOPRAZOLE) Take 1 capsule by mouth once a day  #6 x 0   Entered and Authorized by:   Syliva Overman MD   Signed by:   Syliva Overman MD on 02/02/2010   Method used:   Samples Given   RxID:   [id number] CYCLOBENZAPRINE HCL 10 MG TABS (CYCLOBENZAPRINE HCL) Take 1 tab by mouth at bedtime , for 10 days,then as needed  #30 x 0   Entered and Authorized by:   Syliva Overman MD   Signed by:   Syliva Overman MD on 02/02/2010   Method used:   Electronically to        [facility]* (retail)       [address] [place], [place]  [address]       Ph: [phone]       Fax: [phone]   RxID:   [id number] IBUPROFEN 800 MG TABS (IBUPROFEN) Take 1 tablet by mouth three times a day for 10 days, then one daily as needed  #50 x 0   Entered and Authorized by:   Syliva Overman MD   Signed by:   Syliva Overman MD on 02/02/2010   Method used:   Electronically to        [facility]* (retail)       [address] [place], [place]  [address]       Ph: [phone]       Fax: [phone]   RxID:   [id number] PREDNISONE (PAK) 5 MG TABS (PREDNISONE)  Use as directed  #21 x 0   Entered and Authorized by:   Syliva Overman MD   Signed by:   Syliva Overman MD on 02/02/2010   Method used:   Electronically to        [facility]* (retail)       [address] [place], [place]  [address]       Ph: [phone]       Fax: [phone]   RxID:   [id number]    Medication Administration  Injection # 1:    Medication: Depo- Medrol 80mg     Diagnosis: BACK PAIN (ICD-724.5)    Route: IM    Site: RUOQ gluteus    Exp Date: 06/12    Lot #: [id number]    Mfr: Pharmacia    Patient tolerated injection without complications    Given by: Adella Hare LPN (February 02, 2010 4:05 PM)  Injection # 2:    Medication: Ketorolac-Toradol 15mg     Diagnosis: BACK PAIN (ICD-724.5)    Route: IM    Site: LUOQ gluteus    Exp Date: 01/19/2011    Lot #: [id number]    Mfr: [email or web address]    Comments: toradol 60mg  given    Patient tolerated injection without complications    Given by: Adella Hare LPN (February 02, 2010 4:05 PM)  Orders Added: 1)  Est. Patient Level IV [17510] 2)  Depo- Medrol 80mg  [J1040] 3)  Ketorolac-Toradol 15mg  [J1885] 4)  Admin of Therapeutic Inj  intramuscular or subcutaneous [96372] 5)  Influenza Vaccine NON MCR [00028] 6)  Radiology Referral [Radiology]   Immunizations Administered:  Influenza Vaccine # 1:  Vaccine Type: Fluvax Non-MCR    Site: left deltoid    Mfr: novartis    Dose: 0.5 ml    Route: IM    Given by: Adella Hare LPN    Exp. Date: 07/2010    Lot #: [id number]    VIS given: 10/12/09 version given February 02, 2010.   Immunizations Administered:  Influenza Vaccine # 1:    Vaccine Type: Fluvax Non-MCR    Site: left deltoid    Mfr: novartis    Dose: 0.5 ml    Route: IM    Given by: Adella Hare LPN    Exp. Date: 07/2010    Lot #: [id number]    VIS given: 10/12/09 version given February 02, 2010.   Medication Administration  Injection # 1:    Medication: Depo-  Medrol 80mg     Diagnosis: BACK PAIN (ICD-724.5)    Route: IM    Site: RUOQ gluteus    Exp Date: 06/12    Lot #: [id number]    Mfr: Pharmacia    Patient tolerated injection without complications    Given by: Adella Hare LPN (February 02, 2010 4:05 PM)  Injection # 2:    Medication: Ketorolac-Toradol 15mg     Diagnosis: BACK PAIN (ICD-724.5)    Route: IM    Site: LUOQ gluteus    Exp Date: 01/19/2011    Lot #: [id number]    Mfr: novaplus    Comments: toradol 60mg  given    Patient tolerated injection without complications    Given by: Adella Hare LPN (February 02, 2010 4:05 PM)  Orders Added: 1)  Est. Patient Level IV [11914] 2)  Depo- Medrol 80mg  [J1040] 3)  Ketorolac-Toradol 15mg  [J1885] 4)  Admin of Therapeutic Inj  intramuscular or subcutaneous [96372] 5)  Influenza Vaccine NON MCR [00028] 6)  Radiology Referral [Radiology]

## 2010-04-19 NOTE — Assessment & Plan Note (Signed)
Summary: office visit  Nurse Visit   Vitals Entered By: Everitt Amber (May 04, 2009 7:50 AM) Comments Patient in office today for a bilateral ear flushing. Patient tolerated procedure well and ears successfully irrigated with no complications. Was checked by Dr. Lodema Hong    Allergies: No Known Drug Allergies  Orders Added: 1)  Cerumen Impaction Removal [69210] Prescriptions: PENICILLIN V POTASSIUM 500 MG TABS (PENICILLIN V POTASSIUM) Take 1 tablet by mouth three times a day  #30 x 0   Entered and Authorized by:   Syliva Overman MD   Signed by:   Syliva Overman MD on 05/03/2009   Method used:   Electronically to        [facility]* (retail)       [address]       [place], [place]  [address]       Ph: [phone]       Fax: [phone]   RxID:   [id number]  right ear canal erythematous , wax removed without obvious trauma,penicillin 500mg  #30 prescribed

## 2010-04-19 NOTE — Letter (Signed)
Summary: lab  lab   Imported By: Curtis Sites 09/08/2009 11:52:56  _____________________________________________________________________  External Attachment:    Type:   Image     Comment:   External Document

## 2010-05-06 ENCOUNTER — Telehealth: Payer: Self-pay | Admitting: Family Medicine

## 2010-05-11 ENCOUNTER — Telehealth (INDEPENDENT_AMBULATORY_CARE_PROVIDER_SITE_OTHER): Payer: Self-pay | Admitting: *Deleted

## 2010-05-11 NOTE — Progress Notes (Signed)
Summary: sick  Phone Note Call from Patient   Summary of Call: been coughing for a couple of days and chest is hurting. no appts. what can she do [phone] Initial call taken by: Rudene Anda,  May 06, 2010 8:15 AM  Follow-up for Phone Call        has been using otc coricidin, constant coughing for a few days and now chest is sore. Able to bring up light yellowish phlegm. Checked with front staff to see if there were anywhere we could put her and there was not. Advised mucinex and robitussin and urgent care if she started feeling worse. There wasn't even an opening monday. Does she need a chest x ray or anything? Follow-up by: Everitt Amber LPN,  May 06, 2010 9:27 AM  Additional Follow-up for Phone Call Additional follow up Details #1::        dry cough since Sunday, in 2 days yellow, , no fever  or chills, no nasal drainage, tessalon perles sent in pt will call back on mon if feels she needs to be  eval in the office advised urgent care if she worsens Additional Follow-up by: Abiageal Blowe MD,  May 06, 2010 12:02 PM    New/Updated Medications: TESSALON PERLES 100 MG CAPS (BENZONATATE) Take 1 capsule by mouth three times a day Prescriptions: TESSALON PERLES 100 MG CAPS (BENZONATATE) Take 1 capsule by mouth three times a day  #42 x 1   Entered and Authorized by:   Janvi Ammar MD   Signed by:   Sherleen Pangborn MD on 05/06/2010   Method used:   Electronically to        [facility]* (retail)       [address]       [place], [place]  [address]       Ph: [phone]       Fax: [phone]   RxID:   [id number]

## 2010-05-11 NOTE — Letter (Signed)
Summary: DR. Patrecia Pace  DR. MORAYATI   Imported By: Lind Guest 05/04/2010 10:24:16  _____________________________________________________________________  External Attachment:    Type:   Image     Comment:   External Document

## 2010-05-12 ENCOUNTER — Ambulatory Visit (INDEPENDENT_AMBULATORY_CARE_PROVIDER_SITE_OTHER): Payer: 59 | Admitting: Family Medicine

## 2010-05-12 ENCOUNTER — Encounter: Payer: Self-pay | Admitting: Family Medicine

## 2010-05-12 DIAGNOSIS — I10 Essential (primary) hypertension: Secondary | ICD-10-CM

## 2010-05-12 DIAGNOSIS — J209 Acute bronchitis, unspecified: Secondary | ICD-10-CM

## 2010-05-12 DIAGNOSIS — D239 Other benign neoplasm of skin, unspecified: Secondary | ICD-10-CM | POA: Insufficient documentation

## 2010-05-12 DIAGNOSIS — E785 Hyperlipidemia, unspecified: Secondary | ICD-10-CM

## 2010-05-12 HISTORY — DX: Other benign neoplasm of skin, unspecified: D23.9

## 2010-05-17 NOTE — Progress Notes (Signed)
Summary: BRONCHITIS  Phone Note Call from Patient   Summary of Call: patient HAS BRONCHITIS AND WANTS TO COME THE DR. ASAP SHE WAS TOLD TO CALL BACK IF THIS WAS NO BETTER. THE MEDICINES SHE RECIEVED IS DOING NO GOOD AT ALL. Initial call taken by: Lind Guest,  May 11, 2010 2:56 PM  Follow-up for Phone Call        I know you have class tomorrow but can she be worked in? Follow-up by: Everitt Amber LPN,  May 11, 2010 4:45 PM  Additional Follow-up for Phone Call Additional follow up Details #1::        pls add in tomorrow Additional Follow-up by: Syliva Overman MD,  May 11, 2010 4:49 PM    Additional Follow-up for Phone Call Additional follow up Details #2::    Luann to schedule  Follow-up by: Everitt Amber LPN,  May 11, 2010 4:54 PM  Additional Follow-up for Phone Call Additional follow up Details #3:: Details for Additional Follow-up Action Taken: coming in tomorrow @ 8:00 Additional Follow-up by: Lind Guest,  May 11, 2010 4:56 PM

## 2010-05-26 NOTE — Assessment & Plan Note (Signed)
Summary: sick   Vital Signs:  Patient profile:   65 year old female Menstrual status:  postmenopausal Height:      62.5 inches Weight:      184.25 pounds BMI:     33.28 O2 Sat:      97 % Temp:     98.6 degrees F oral Pulse rate:   83 / minute Pulse rhythm:   regular Resp:     16 per minute BP sitting:   124 / 80  (left arm) Cuff size:   large  Vitals Entered By: Everitt Amber LPN (May 12, 2010 8:05 AM)  Nutrition Counseling: Patient's BMI is greater than 25 and therefore counseled on weight management options. CC: c/o chest and nasal congestion, yellow phlegm, hoarsness x 1 week    Primary Care Provider:  Syliva Overman MD  CC:  c/o chest and nasal congestion, yellow phlegm, and hoarsness x 1 week .  History of Present Illness: 1 week h/o head and chest  congestion wiith yellow nasal drainage, cough productive of yellow sputum , hoarseness , chills and intermittent fever. oTC meds habve provided little relief, but no cure. She has fatigueand poor sleep as well as malise and por apetitie . Prior to this she had been well.   Reports hest painwith deep breaths and cough, denies  palpitations, PND, orthopnea or leg swelling. Denies abdominal pain, nausea, vomitting, diarrhea or constipation. Denies change in bowel movements or bloody stool. Denies dysuria , frequency, incontinence or hesitancy. Denies  joint pain, swelling, or reduced mobility. Denies headaches, vertigo, seizures. Denies depression, anxiety or insomnia.    Current Medications (verified): 1)  Aspirin 81 Mg  Tabs (Aspirin) .... Take 1 Tablet By Mouth Once A Day 2)  Oyster Shell Calcium + D 500-400 Mg-Unit  Tabs (Calcium-Vitamin D) .... Take 1 Tablet By Mouth Three Times A Day 3)  Nicardipine Hcl 30 Mg  Caps (Nicardipine Hcl) .... Take 1 Tablet By Mouth Two Times A Day 4)  Simvastatin 40 Mg  Tabs (Simvastatin) .... Take 1 Tab By Mouth At Bedtime 5)  Bayer Arthritis Pain Reliever 650 Mg .... 2 Tab By  Mouth As Needed For Pain 6)  Ibuprofen 800 Mg Tabs (Ibuprofen) .... Take 1 Tablet By Mouth Three Times A Day For 10 Days, Then One Daily As Needed 7)  Cyclobenzaprine Hcl 10 Mg Tabs (Cyclobenzaprine Hcl) .... Take 1 Tab By Mouth At Bedtime , For 10 Days,then As Needed 8)  Tessalon Perles 100 Mg Caps (Benzonatate) .... Take 1 Capsule By Mouth Three Times A Day  Allergies (verified): No Known Drug Allergies  Review of Systems Eyes:  Denies discharge, eye pain, and red eye. ENT:  Complains of nasal congestion, postnasal drainage, sinus pressure, and sore throat; 1 week history. Resp:  Complains of cough, shortness of breath, and sputum productive; 1 week h/o cough productive brown sputum. Derm:  Complains of lesion(s); 1 month h/o lesion which is fast growing and changing color rapidly on left breast, also there is another on the left inner thigh she is cioncerned about which has been present for years. Psych:  Denies anxiety and depression. Endo:  Denies cold intolerance, excessive hunger, excessive thirst, excessive urination, and heat intolerance. Heme:  Denies abnormal bruising, bleeding, and enlarge lymph nodes. Allergy:  Denies hives or rash, itching eyes, and persistent infections.  Physical Exam  General:  Well-developed,well-nourished,in no acute distress; alert,appropriate and cooperative throughout examinationIll appearing. HEENT: No facial asymmetry,  EOMI, maxillary siinus tenderness,  TM's Clear, oropharynx  pink and moist.   Chest: decreased air entry , scattered crackles and wheezes   CVS: S1, S2, No murmurs, No S3.   Abd: Soft, Nontender.  MS: Adequate ROM spine, hips, shoulders and knees.  Ext: No edema.   CNS: CN 2-12 intact, power tone and sensation normal throughout.   Skin: Intact, pigmented nevi on left breast and left inner thigh Psych: Good eye contact, normal affect.  Memory intact, not anxious or depressed appearing.    Impression & Recommendations:  Problem  # 1:  ACUTE BRONCHITIS (ICD-466.0) Assessment Comment Only  Her updated medication list for this problem includes:    Tessalon Perles 100 Mg Caps (Benzonatate) .Marland Kitchen... Take 1 capsule by mouth three times a day    Septra Ds 800-160 Mg Tabs (Sulfamethoxazole-trimethoprim) .Marland Kitchen... Take 1 tablet by mouth two times a day  Orders: Rocephin  250mg  (Z6109) Depo- Medrol 80mg  (J1040) Admin of Therapeutic Inj  intramuscular or subcutaneous (60454)  Problem # 2:  NEVUS (ICD-216.9) Assessment: Comment Only  Future Orders: Dermatology Referral (Derma) ... 05/18/2010  Problem # 3:  INSOMNIA (ICD-780.52) Assessment: Deteriorated  Discussed sleep hygiene.   Problem # 4:  HYPERTENSION (ICD-401.9) Assessment: Improved  Her updated medication list for this problem includes:    Nicardipine Hcl 30 Mg Caps (Nicardipine hcl) .Marland Kitchen... Take 1 tablet by mouth two times a day    Moexipril-hydrochlorothiazide 15-25 Mg Tabs (Moexipril-hydrochlorothiazide) .Marland Kitchen... Take 1 tablet by mouth once a day  Orders: T-Basic Metabolic Panel [id number])  BP today: 124/80 Prior BP: 160/90 (02/02/2010)  Labs Reviewed: K+: 4.6 (09/23/2009) Creat: : 0.87 (09/23/2009)   Chol: 182 (09/22/2009)   HDL: 74 (09/22/2009)   LDL: 97 (09/22/2009)   TG: 57 (09/22/2009)  Problem # 5:  UNSPECIFIED DISORDER OF THYROID (ICD-246.9) Assessment: Comment Only trrated by endocrine, reports stability   Complete Medication List: 1)  Aspirin 81 Mg Tabs (Aspirin) .... Take 1 tablet by mouth once a day 2)  Oyster Shell Calcium + D 500-400 Mg-unit Tabs (Calcium-vitamin d) .... Take 1 tablet by mouth three times a day 3)  Nicardipine Hcl 30 Mg Caps (Nicardipine hcl) .... Take 1 tablet by mouth two times a day 4)  Simvastatin 40 Mg Tabs (Simvastatin) .... Take 1 tab by mouth at bedtime 5)  Bayer Arthritis Pain Reliever 650 Mg  .... 2 tab by mouth as needed for pain 6)  Cyclobenzaprine Hcl 10 Mg Tabs (Cyclobenzaprine hcl) .... Take 1 tab by mouth  at bedtime , for 10 days,then as needed 7)  Tessalon Perles 100 Mg Caps (Benzonatate) .... Take 1 capsule by mouth three times a day 8)  Prednisone (pak) 5 Mg Tabs (Prednisone) .... Use as directed 9)  Septra Ds 800-160 Mg Tabs (Sulfamethoxazole-trimethoprim) .... Take 1 tablet by mouth two times a day 10)  Ibuprofen 800 Mg Tabs (Ibuprofen) .... Take 1 tablet by mouth two times a day as needed for back pain, maximum is 5 per week 11)  Tylenol Arthritis Pain 650 Mg Cr-tabs (Acetaminophen) .... One top two tablets at bedtime as needed for back pain, maximum use 3 days per week 12)  Moexipril-hydrochlorothiazide 15-25 Mg Tabs (Moexipril-hydrochlorothiazide) .... Take 1 tablet by mouth once a day  Other Orders: T-Lipid Profile [id number]) T-Hepatic Function [phone]) T-CBC w/Diff [phone])  Patient Instructions: 1)  CPE in 3 months. 2)  You are being treated for acute bronchitis, you will get injections in the office today, Rocephin 500mg  and depomedrol 80mg  IM, also meds  are sent in 3)  BMP prior to visit, ICD-9: 4)  Hepatic Panel prior to visit, ICD-9: 5)  Lipid Panel prior to visit, ICD-9:  fasting end March 6)  CBC w/ Diff prior to visit, ICD-9: 7)  meds as discussed Prescriptions: SIMVASTATIN 40 MG  TABS (SIMVASTATIN) Take 1 tab by mouth at bedtime  #90 x 3   Entered by:   Adella Hare LPN   Authorized by:   Syliva Overman MD   Signed by:   Adella Hare LPN on 04/54/0981   Method used:   Faxed to ...       [facility] (mail-order)             , [place]         Ph: [phone]       Fax: [phone]   RxID:   [id number] NICARDIPINE HCL 30 MG  CAPS (NICARDIPINE HCL) Take 1 tablet by mouth two times a day  #180 x 3   Entered by:   Adella Hare LPN   Authorized by:   Syliva Overman MD   Signed by:   Adella Hare LPN on 02/72/5366   Method used:   Faxed to ...       [facility] (mail-order)             , [place]         Ph: [phone]       Fax: [phone]   RxID:    [id number] MOEXIPRIL-HYDROCHLOROTHIAZIDE 15-25 MG TABS (MOEXIPRIL-HYDROCHLOROTHIAZIDE) Take 1 tablet by mouth once a day  #90 x 3   Entered and Authorized by:   Syliva Overman MD   Signed by:   Syliva Overman MD on 05/12/2010   Method used:   Print then Give to Patient   RxID:   [id number] TYLENOL ARTHRITIS PAIN 650 MG CR-TABS (ACETAMINOPHEN) one top two tablets at bedtime as needed for back pain, maximum use 3 days per week  #60 x 1   Entered and Authorized by:   Syliva Overman MD   Signed by:   Syliva Overman MD on 05/12/2010   Method used:   Historical   RxID:   [id number] IBUPROFEN 800 MG TABS (IBUPROFEN) Take 1 tablet by mouth two times a day as needed for back pain, maximum is 5 per week  #60 x 1   Entered and Authorized by:   Syliva Overman MD   Signed by:   Syliva Overman MD on 05/12/2010   Method used:   Electronically to        [facility] (retail)       [address]       [place], [place]  [address]       Ph: [phone]       Fax: [phone]   RxID:   [id number] SEPTRA DS 800-160 MG TABS (SULFAMETHOXAZOLE-TRIMETHOPRIM) Take 1 tablet by mouth two times a day  #20 x 0   Entered and Authorized by:   Syliva Overman MD   Signed by:   Syliva Overman MD on 05/12/2010   Method used:   Electronically to        [facility] (retail)       [address]       [place], [place]  [address]       Ph: [phone]       Fax: [phone]   RxID:  [id number] PREDNISONE (PAK) 5 MG TABS (PREDNISONE) Use as directed  #21 x 0   Entered and Authorized by:   Syliva Overman MD   Signed by:   Syliva Overman MD on 05/12/2010   Method used:   Electronically to        [facility] (retail)       [address]       [place], [place]  [address]       Ph: [phone]       Fax: [phone]   RxID:   [id number]    Medication  Administration  Injection # 1:    Medication: Rocephin  250mg     Diagnosis: ACUTE BRONCHITIS (ICD-466.0)    Route: IM    Site: RUOQ gluteus    Exp Date: 01/14    Lot #: [id number]    Mfr: [email or web address]    Comments: rocephin 500mg  given from 1gram vial    Patient tolerated injection without complications    Given by: Adella Hare LPN (May 12, 2010 9:20 AM)  Injection # 2:    Medication: Depo- Medrol 80mg     Diagnosis: ACUTE BRONCHITIS (ICD-466.0)    Route: IM    Site: LUOQ gluteus    Exp Date: 11/12    Lot #: [id number]    Mfr: Pharmacia    Patient tolerated injection without complications    Given by: Adella Hare LPN (May 12, 2010 9:21 AM)  Orders Added: 1)  Est. Patient Level IV [99214] 2)  T-Basic Metabolic Panel [[id number]] 3)  T-Lipid Profile [80061-22930] 4)  T-Hepatic Function [80076-22960] 5)  T-CBC w/Diff [[id number]] 6)  Rocephin  250mg  [J0696] 7)  Depo- Medrol 80mg  [J1040] 8)  Admin of Therapeutic Inj  intramuscular or subcutaneous [96372] 9)  Dermatology Referral [Derma]     Medication Administration  Injection # 1:    Medication: Rocephin  250mg     Diagnosis: ACUTE BRONCHITIS (ICD-466.0)    Route: IM    Site: RUOQ gluteus    Exp Date: 01/14    Lot #: [id number]    Mfr: [email or web address]    Comments: rocephin 500mg  given from 1gram vial    Patient tolerated injection without complications    Given by: Adella Hare LPN (May 12, 2010 9:20 AM)  Injection # 2:    Medication: Depo- Medrol 80mg     Diagnosis: ACUTE BRONCHITIS (ICD-466.0)    Route: IM    Site: LUOQ gluteus    Exp Date: 11/12    Lot #: [id number]    Mfr: Pharmacia    Patient tolerated injection without complications    Given by: Adella Hare LPN (May 12, 2010 9:21 AM)  Orders Added: 1)  Est. Patient Level IV [63016] 2)  T-Basic Metabolic Panel [80048-22910] 3)  T-Lipid Profile [80061-22930] 4)  T-Hepatic Function [80076-22960] 5)  T-CBC w/Diff [01093-23557] 6)  Rocephin  250mg   [J0696] 7)  Depo- Medrol 80mg  [J1040] 8)  Admin of Therapeutic Inj  intramuscular or subcutaneous [96372] 9)  Dermatology Referral [Derma]

## 2010-07-20 ENCOUNTER — Telehealth: Payer: Self-pay | Admitting: Family Medicine

## 2010-07-20 MED ORDER — MOEXIPRIL-HYDROCHLOROTHIAZIDE 15-25 MG PO TABS: 1.0000 | ORAL_TABLET | Freq: Every day | ORAL | Status: DC

## 2010-07-20 NOTE — Telephone Encounter (Signed)
Med sent as requested 

## 2010-08-16 ENCOUNTER — Other Ambulatory Visit: Payer: Self-pay | Admitting: Family Medicine

## 2010-08-17 LAB — CBC WITH DIFFERENTIAL/PLATELET
Eosinophils Relative: 2 % (ref 0–5)
HCT: 38.1 % (ref 36.0–46.0)
Hemoglobin: 12.6 g/dL (ref 12.0–15.0)
Lymphocytes Relative: 42 % (ref 12–46)
Lymphs Abs: 2.2 10*3/uL (ref 0.7–4.0)
MCV: 81.2 fL (ref 78.0–100.0)
Monocytes Absolute: 0.5 10*3/uL (ref 0.1–1.0)
Monocytes Relative: 10 % (ref 3–12)
RBC: 4.69 MIL/uL (ref 3.87–5.11)
WBC: 5.3 10*3/uL (ref 4.0–10.5)

## 2010-08-17 LAB — HEPATIC FUNCTION PANEL
Albumin: 4.1 g/dL (ref 3.5–5.2)
Alkaline Phosphatase: 80 U/L (ref 39–117)
Indirect Bilirubin: 0.9 mg/dL (ref 0.0–0.9)
Total Bilirubin: 1.1 mg/dL (ref 0.3–1.2)

## 2010-08-17 LAB — LIPID PANEL
LDL Cholesterol: 103 mg/dL — ABNORMAL HIGH (ref 0–99)
VLDL: 11 mg/dL (ref 0–40)

## 2010-08-17 LAB — BASIC METABOLIC PANEL
BUN: 19 mg/dL (ref 6–23)
CO2: 34 mEq/L — ABNORMAL HIGH (ref 19–32)
Chloride: 106 mEq/L (ref 96–112)
Creat: 0.95 mg/dL (ref 0.40–1.20)
Potassium: 4.7 mEq/L (ref 3.5–5.3)

## 2010-08-19 ENCOUNTER — Encounter: Payer: Self-pay | Admitting: Family Medicine

## 2010-08-22 ENCOUNTER — Encounter: Payer: Self-pay | Admitting: Family Medicine

## 2010-08-22 ENCOUNTER — Other Ambulatory Visit: Payer: Self-pay | Admitting: Family Medicine

## 2010-08-22 ENCOUNTER — Ambulatory Visit (INDEPENDENT_AMBULATORY_CARE_PROVIDER_SITE_OTHER): Payer: 59 | Admitting: Family Medicine

## 2010-08-22 ENCOUNTER — Other Ambulatory Visit (HOSPITAL_COMMUNITY)
Admission: RE | Admit: 2010-08-22 | Discharge: 2010-08-22 | Disposition: A | Discharge status: Home / Self Care | Payer: 59 | Source: Ambulatory Visit | Attending: Family Medicine | Admitting: Family Medicine

## 2010-08-22 VITALS — BP 130/80 | HR 83 | Resp 16 | Ht 64.0 in | Wt 190.0 lb

## 2010-08-22 DIAGNOSIS — E669 Obesity, unspecified: Secondary | ICD-10-CM

## 2010-08-22 DIAGNOSIS — I10 Essential (primary) hypertension: Secondary | ICD-10-CM

## 2010-08-22 DIAGNOSIS — M549 Dorsalgia, unspecified: Secondary | ICD-10-CM

## 2010-08-22 DIAGNOSIS — E785 Hyperlipidemia, unspecified: Secondary | ICD-10-CM

## 2010-08-22 DIAGNOSIS — Z1211 Encounter for screening for malignant neoplasm of colon: Secondary | ICD-10-CM

## 2010-08-22 DIAGNOSIS — Z139 Encounter for screening, unspecified: Secondary | ICD-10-CM

## 2010-08-22 DIAGNOSIS — E079 Disorder of thyroid, unspecified: Secondary | ICD-10-CM

## 2010-08-22 DIAGNOSIS — Z01419 Encounter for gynecological examination (general) (routine) without abnormal findings: Secondary | ICD-10-CM | POA: Insufficient documentation

## 2010-08-22 DIAGNOSIS — Z124 Encounter for screening for malignant neoplasm of cervix: Secondary | ICD-10-CM

## 2010-08-22 DIAGNOSIS — Z0000 Encounter for general adult medical examination without abnormal findings: Secondary | ICD-10-CM

## 2010-08-22 LAB — POC HEMOCCULT BLD/STL (OFFICE/1-CARD/DIAGNOSTIC): Fecal Occult Blood, POC: NEGATIVE

## 2010-08-22 NOTE — Progress Notes (Signed)
  Subjective:    Patient ID: Christina Carroll, female    DOB: 1945/06/19, 65 y.o.   MRN: [id number]  HPI The PT is here for annual exam and re-evaluation of chronic medical conditions, medication management and review of recent lab and radiology data.  Preventive health is updated, specifically  Cancer screening, Osteoporosis screening and Immunization.   Questions or concerns regarding consultations or procedures which the PT has had in the interim are  addressed. The PT denies any adverse reactions to current medications since the last visit.  There are no new concerns.  She states that her body is telling her that she needs to cut back or stop her current job. She suffers from backache , her job is physical at times as a Paramedic, she has always had serious probs with sleep deprivation, and states that on more than one occasion she has found herself dozing on the road home after working the night shift.    Review of Systems Denies recent fever or chills. Denies sinus pressure, nasal congestion, ear pain or sore throat. Denies chest congestion, productive cough or wheezing. Denies chest pains, palpitations, paroxysmal nocturnal dyspnea, orthopnea and leg swelling Denies abdominal pain, nausea, vomiting,diarrhea or constipation.  Denies rectal bleeding or change in bowel movement. Denies dysuria, frequency, hesitancy or incontinence. Denies headaches, seizure, numbness, or tingling. Denies depression,or  anxiety . Denies skin break down or rash.        Objective:   Physical Exam Pleasant well nourished female, alert and oriented x 3, in no cardio-pulmonary distress. Afebrile. HEENT No facial trauma or asymetry.   EOMI, PERTL, fundoscopic exam is normal, no hemorhage or exudate. No papiledema External ears normal, tympanic membranes clear. Oropharynx moist, no exudate, good dentition. Neck: supple, no adenopathy,JVD or thyromegaly.No bruits.  Chest: Clear to  ascultation bilaterally.No crackles or wheezes. Non tender to palpation  Breast: No asymetry,no masses. No nipple discharge or inversion. No axillary or supraclavicular adenopathy  Cardiovascular system; Heart sounds normal,  S1 and  S2 ,no S3.  No murmur, or thrill. Apical beat not displaced Peripheral pulses normal.  Abdomen: Soft, non tender, no organomegaly or masses. No bruits. Bowel sounds normal. No guarding, tenderness or rebound.  Rectal:  No mass. guaic negative stool.  GU: External genitalia normal. No lesions. Vaginal canal normal.No discharge. Uterus normal size, no adnexal masses, no cervical motion or adnexal tenderness.  Musculoskeletal exam: Decreased  ROM of spine,adequate in  hips , shoulders and knees. No deformity ,swelling or crepitus noted. No muscle wasting or atrophy.   Neurologic: Cranial nerves 2 to 12 intact. Power, tone ,sensation and reflexes normal throughout. No disturbance in gait. No tremor.  Skin: Intact, no ulceration, erythema , scaling or rash noted. Pigmentation normal throughout  Psych; Normal mood and affect. Judgement and concentration normal        Assessment & Plan:

## 2010-08-22 NOTE — Patient Instructions (Addendum)
F/u in 6  Months  It is important that you exercise regularly at least 30 minutes 5 times a week. If you develop chest pain, have severe difficulty breathing, or feel very tired, stop exercising immediately and seek medical attention    A healthy diet is rich in fruit, vegetables and whole grains. Poultry fish, nuts and beans are a healthy choice for protein rather then red meat. A low sodium diet and drinking 64 ounces of water daily is generally recommended. Oils and sweet should be limited. Carbohydrates especially for those who are diabetic or overweight, should be limited to 34-45 gram per meal. It is important to eat on a regular schedule, at least 3 times daily. Snacks should be primarily fruits, vegetables or nuts. Weight loss goal is 5 to 10 pounds  Labs are excellent, except that your bad cholesterol is slightly high We will sched  your mammogram around 9am , pls sched an eye exam, also a podiatry eval  Fasting chem 7, lipid and hepatic ion 6 months

## 2010-08-29 ENCOUNTER — Telehealth: Payer: Self-pay | Admitting: Family Medicine

## 2010-08-29 ENCOUNTER — Ambulatory Visit (HOSPITAL_COMMUNITY)
Admission: RE | Admit: 2010-08-29 | Discharge: 2010-08-29 | Disposition: A | Discharge status: Home / Self Care | Payer: 59 | Source: Ambulatory Visit | Attending: Family Medicine | Admitting: Family Medicine

## 2010-08-29 DIAGNOSIS — Z1231 Encounter for screening mammogram for malignant neoplasm of breast: Secondary | ICD-10-CM | POA: Insufficient documentation

## 2010-08-29 DIAGNOSIS — Z139 Encounter for screening, unspecified: Secondary | ICD-10-CM

## 2010-08-29 NOTE — Assessment & Plan Note (Signed)
Deteriorated. Patient re-educated about  the importance of commitment to a  minimum of 150 minutes of exercise per week. The importance of healthy food choices with portion control discussed. Encouraged to start a food diary, count calories and to consider  joining a support group. Sample diet sheets offered. Goals set by the patient for the next several months.    

## 2010-08-29 NOTE — Assessment & Plan Note (Signed)
Followed by endo, controlled 

## 2010-08-29 NOTE — Assessment & Plan Note (Addendum)
Improved,no med change, low fat diet discussed

## 2010-08-29 NOTE — Assessment & Plan Note (Signed)
Controlled, no change in medication  

## 2010-08-29 NOTE — Assessment & Plan Note (Signed)
Deteriorated, pt encouraged to ensure she uses a correct technique when lifting on the job, and to do back strengthening exercises

## 2010-08-31 NOTE — Telephone Encounter (Signed)
Copy faxed to Dr. Pricilla Holm at her request

## 2010-12-27 ENCOUNTER — Other Ambulatory Visit: Payer: Self-pay | Admitting: Family Medicine

## 2010-12-27 ENCOUNTER — Telehealth: Payer: Self-pay | Admitting: Family Medicine

## 2010-12-27 DIAGNOSIS — M25512 Pain in left shoulder: Secondary | ICD-10-CM

## 2010-12-27 NOTE — Telephone Encounter (Signed)
Referral entered, thanks.

## 2010-12-27 NOTE — Telephone Encounter (Signed)
She will go to Dr. Diamantina Providence please but in the referell

## 2010-12-27 NOTE — Telephone Encounter (Signed)
Advise x ray alone will not be useful , I recimmend eval by orthio, if she agrees let me know I will refer her to rotho of her choice ask if Dr Romeo Apple is fine with her pls

## 2011-02-07 ENCOUNTER — Other Ambulatory Visit: Payer: Self-pay | Admitting: Family Medicine

## 2011-02-15 ENCOUNTER — Encounter: Payer: Self-pay | Admitting: Family Medicine

## 2011-02-20 ENCOUNTER — Encounter: Payer: Self-pay | Admitting: Family Medicine

## 2011-02-21 ENCOUNTER — Encounter: Payer: Self-pay | Admitting: Family Medicine

## 2011-02-21 ENCOUNTER — Ambulatory Visit (INDEPENDENT_AMBULATORY_CARE_PROVIDER_SITE_OTHER): Payer: 59 | Admitting: Family Medicine

## 2011-02-21 VITALS — BP 122/84 | HR 75 | Resp 16 | Ht 64.0 in | Wt 184.1 lb

## 2011-02-21 DIAGNOSIS — D239 Other benign neoplasm of skin, unspecified: Secondary | ICD-10-CM

## 2011-02-21 DIAGNOSIS — E079 Disorder of thyroid, unspecified: Secondary | ICD-10-CM

## 2011-02-21 DIAGNOSIS — I10 Essential (primary) hypertension: Secondary | ICD-10-CM

## 2011-02-21 DIAGNOSIS — Z23 Encounter for immunization: Secondary | ICD-10-CM

## 2011-02-21 DIAGNOSIS — E785 Hyperlipidemia, unspecified: Secondary | ICD-10-CM

## 2011-02-21 DIAGNOSIS — E669 Obesity, unspecified: Secondary | ICD-10-CM

## 2011-02-21 NOTE — Assessment & Plan Note (Signed)
Nevus on left breast, wants this removed, will refer to local derm

## 2011-02-21 NOTE — Assessment & Plan Note (Signed)
Hyperlipidemia:Low fat diet discussed and encouraged.  Labs due  

## 2011-02-21 NOTE — Assessment & Plan Note (Signed)
Improved. Pt applauded on succesful weight loss through lifestyle change, and encouraged to continue same. Weight loss goal set for the next several months.  

## 2011-02-21 NOTE — Assessment & Plan Note (Signed)
Controlled, no change in medication  

## 2011-02-21 NOTE — Patient Instructions (Signed)
CPE June 5 or after.  Congrats on blood pressure and weight loss.  A healthy diet is rich in fruit, vegetables and whole grains. Poultry fish, nuts and beans are a healthy choice for protein rather then red meat. A low sodium diet and drinking 64 ounces of water daily is generally recommended. Oils and sweet should be limited. Carbohydrates especially for those who are diabetic or overweight, should be limited to 30-45 gram per meal. It is important to eat on a regular schedule, at least 3 times daily. Snacks should be primarily fruits, vegetables or nuts.   It is important that you exercise regularly at least 30 minutes 5 times a week. If you develop chest pain, have severe difficulty breathing, or feel very tired, stop exercising immediately and seek medical attention   Flu vaccine today.  You are referred to local dermatologist.  Fasting lipid , hepatic and chem 7 this week please

## 2011-02-21 NOTE — Assessment & Plan Note (Signed)
Followed by endo, currently on no med

## 2011-02-21 NOTE — Progress Notes (Signed)
  Subjective:    Patient ID: Christina Carroll, female    DOB: [birth year], 65 y.o.   MRN: [id number]  HPI The PT is here for follow up and re-evaluation of chronic medical conditions, medication management and review of any available recent lab and radiology data.  Preventive health is updated, specifically  Cancer screening and Immunization.   Questions or concerns regarding consultations or procedures which the PT has had in the interim are  addressed. The PT denies any adverse reactions to current medications since the last visit.  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 Denies sinus pressure, nasal congestion, ear pain or sore throat. Denies chest congestion, productive cough or wheezing. Denies chest pains, palpitations and leg swelling Denies abdominal pain, nausea, vomiting,diarrhea or constipation.   Denies dysuria, frequency, hesitancy or incontinence.  Denies headaches, seizures, numbness, or tingling. Denies depression, anxiety or insomnia. Denies skin break down or rash.     Objective:   Physical Exam Patient alert and oriented and in no cardiopulmonary distress.  HEENT: No facial asymmetry, EOMI, no sinus tenderness,  oropharynx pink and moist.  Neck supple no adenopathy.  Chest: Clear to auscultation bilaterally.  CVS: S1, S2 no murmurs, no S3.  ABD: Soft non tender. Bowel sounds normal.  Ext: No edema  MS: Adequate ROM spine, shoulders, hips and knees.  Skin: Intact, no ulcerations or rash noted.  Psych: Good eye contact, normal affect. Memory intact not anxious or depressed appearing.  CNS: CN 2-12 intact, power, tone and sensation normal throughout.        Assessment & Plan:

## 2011-02-27 ENCOUNTER — Other Ambulatory Visit: Payer: Self-pay | Admitting: Family Medicine

## 2011-02-27 LAB — HEPATIC FUNCTION PANEL
Albumin: 4.1 g/dL (ref 3.5–5.2)
Bilirubin, Direct: 0.2 mg/dL (ref 0.0–0.3)
Total Bilirubin: 1 mg/dL (ref 0.3–1.2)

## 2011-02-27 LAB — LIPID PANEL
HDL: 66 mg/dL (ref >39)
LDL Cholesterol: 120 mg/dL — ABNORMAL HIGH (ref 0–99)
Total CHOL/HDL Ratio: 3 Ratio
VLDL: 10 mg/dL (ref 0–40)

## 2011-02-27 LAB — BASIC METABOLIC PANEL
CO2: 29 mEq/L (ref 19–32)
Calcium: 9.7 mg/dL (ref 8.4–10.5)
Chloride: 105 mEq/L (ref 96–112)
Glucose, Bld: 78 mg/dL (ref 70–99)
Potassium: 5.1 mEq/L (ref 3.5–5.3)
Sodium: 141 mEq/L (ref 135–145)

## 2011-03-01 NOTE — Progress Notes (Signed)
Addended by: Abner Greenspan on: 03/01/2011 02:24 PM   Modules accepted: Orders

## 2011-03-03 LAB — HEPATITIS PANEL, ACUTE: Hep B C IgM: NEGATIVE

## 2011-03-24 ENCOUNTER — Other Ambulatory Visit: Payer: Self-pay

## 2011-03-24 ENCOUNTER — Telehealth: Payer: Self-pay | Admitting: Family Medicine

## 2011-03-24 MED ORDER — OYSTER SHELL CALCIUM/D 500-200 MG-UNIT PO TABS: 1.0000 | ORAL_TABLET | Freq: Three times a day (TID) | ORAL | Status: DC

## 2011-03-24 NOTE — Telephone Encounter (Signed)
Med sent in.

## 2011-03-28 ENCOUNTER — Ambulatory Visit (INDEPENDENT_AMBULATORY_CARE_PROVIDER_SITE_OTHER): Payer: 59 | Admitting: Orthopedic Surgery

## 2011-03-28 ENCOUNTER — Encounter: Payer: Self-pay | Admitting: Orthopedic Surgery

## 2011-03-28 VITALS — BP 136/74 | Ht 64.0 in | Wt 174.0 lb

## 2011-03-28 DIAGNOSIS — M7580 Other shoulder lesions, unspecified shoulder: Secondary | ICD-10-CM

## 2011-03-28 DIAGNOSIS — M7540 Impingement syndrome of unspecified shoulder: Secondary | ICD-10-CM

## 2011-03-28 DIAGNOSIS — M67919 Unspecified disorder of synovium and tendon, unspecified shoulder: Secondary | ICD-10-CM

## 2011-03-28 NOTE — Patient Instructions (Addendum)
Start PT / OT  For the next 2 weeks take ibuprofen 800mg  twice daily

## 2011-03-29 ENCOUNTER — Encounter: Payer: Self-pay | Admitting: Orthopedic Surgery

## 2011-03-29 ENCOUNTER — Telehealth: Payer: Self-pay | Admitting: Orthopedic Surgery

## 2011-03-29 NOTE — Telephone Encounter (Signed)
WE CANT TAKE HER OOW JUST TO HAVE THERAPY   SHE CAN HAVE AN EXCUSE TO GO TO THERAPY FOR 1 HR

## 2011-03-29 NOTE — Progress Notes (Signed)
Patient ID: Christina Carroll, female   DOB: 01-08-1946, 66 y.o.   MRN: [id number]   Separate identifiable x-ray reports  Bilateral shoulder films: reason for x-ray bilateral shoulder pain  Images AP and lateral Y. Views with outlet technique  The glenohumeral joint is normal.  The acromion is slightly curved.  The remaining bony anatomy is normal.  Impression normal bilateral shoulder x-rays Subjective:    Christina Carroll is a 66 y.o. female who presents with bilateral shoulder pain. The symptoms began several months ago. Aggravating factors: repetitive activity, at work overhead activity lifting packages. Pain is located diffusely throughout the shoulder and but primarily over the deltoid. Discomfort is described as aching. Symptoms are exacerbated by repetitive movements, overhead movements and lying on the shoulder. Evaluation to date: none. Therapy to date includes: nothing specific.  The following portions of the patient's history were reviewed and updated as appropriate: allergies, current medications, past family history, past medical history, past social history, past surgical history and problem list.  Review of Systems A comprehensive review of systems was negative except for: see scanned docs   Objective:    BP 136/74  Ht 5\' 4"  (1.626 m)  Wt 174 lb (78.926 kg)  BMI 29.87 kg/m2 Right shoulder: non-specific diffuse tenderness about the shoulder, distal neuromuscular exam negative, full ROM, positive for impingement sign, crepitus with ROM, sensory exam normal, motor exam normal and radial pulse intact  Left shoulder: non-specific diffuse tenderness about the shoulder, distal neuromuscular exam negative, full ROM, positive for impingement sign, crepitus with ROM, sensory exam normal, motor exam normal and radial pulse intact     Assessment:    Bilateral rotator cuff tendinitis    Plan:    Educational material distributed. Reduction in offending activity. Gentle  ROM exercises. Plain film x-rays. Shoulder injection. See procedure note.

## 2011-03-29 NOTE — Telephone Encounter (Signed)
Christina Carroll has decided to start her therapy  [facility] and wants to know if you will let her have an out of work note to start 04/03/11 thru 04/20/11.  She will be retired after  04/20/11.

## 2011-03-29 NOTE — Telephone Encounter (Signed)
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 Her shoulder was hurting worse and she told her supervisor and she suggested she get a note to be OOW until 04/20/11 since she will be retiring on that date.

## 2011-03-30 ENCOUNTER — Telehealth: Payer: Self-pay | Admitting: Family Medicine

## 2011-03-30 NOTE — Telephone Encounter (Signed)
Unfortunately that is not a medical reason

## 2011-03-30 NOTE — Telephone Encounter (Signed)
Advised the patient of doctor's reply °

## 2011-03-31 ENCOUNTER — Other Ambulatory Visit: Payer: Self-pay | Admitting: Family Medicine

## 2011-03-31 DIAGNOSIS — R748 Abnormal levels of other serum enzymes: Secondary | ICD-10-CM

## 2011-03-31 LAB — HEPATIC FUNCTION PANEL
ALT: 56 U/L — ABNORMAL HIGH (ref 0–35)
AST: 46 U/L — ABNORMAL HIGH (ref 0–37)
Alkaline Phosphatase: 90 U/L (ref 39–117)
Bilirubin, Direct: 0.1 mg/dL (ref 0.0–0.3)
Total Bilirubin: 0.6 mg/dL (ref 0.3–1.2)

## 2011-04-03 ENCOUNTER — Telehealth: Payer: Self-pay

## 2011-04-03 ENCOUNTER — Other Ambulatory Visit: Payer: Self-pay | Admitting: Family Medicine

## 2011-04-03 DIAGNOSIS — M25512 Pain in left shoulder: Secondary | ICD-10-CM

## 2011-04-03 NOTE — Telephone Encounter (Signed)
noted 

## 2011-04-03 NOTE — Telephone Encounter (Signed)
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 States her current ortho will not take her out of work, there is no reduced work schedule , and she will not go in to work tonight , has worked up to 03/31/2011, states it will worsen her condition. She is due to retire end January. Will attempt to identify a 2nd ortho to help her maneuver through this problem per her request  pls refer to ortho today in [facility] . Dx Bilateral shoulder pain/impingement, requesting to be out of work , currently in [place] therapy. This is a 2nd opinion request Hopefully she can be seen today, not going to work today, contact her on her cell/home with the info pls

## 2011-04-03 NOTE — Telephone Encounter (Signed)
Left message for her to leave a message as to a good time to call me back, unable to speak with her

## 2011-04-03 NOTE — Telephone Encounter (Signed)
Spoke with pt and she stated she wasn't going to see dr/ byrum today. She said tell doc thank you anyway. But she was just going to stay out of work.

## 2011-04-03 NOTE — Progress Notes (Signed)
Pt has appt at [facility] for 04/06/2011 10:30. Pt is aware °

## 2011-04-03 NOTE — Progress Notes (Signed)
Pt has appt at [facility] for 04/06/2011 10:30. Pt is aware

## 2011-04-03 NOTE — Telephone Encounter (Signed)
Pt came in office and was told she had appt  At [facility] for 04/06/2011 10:30. Pt aware

## 2011-04-04 ENCOUNTER — Ambulatory Visit (HOSPITAL_COMMUNITY)
Admission: RE | Admit: 2011-04-04 | Discharge: 2011-04-04 | Disposition: A | Discharge status: Home / Self Care | Payer: 59 | Source: Ambulatory Visit | Attending: Orthopedic Surgery | Admitting: Orthopedic Surgery

## 2011-04-04 DIAGNOSIS — E785 Hyperlipidemia, unspecified: Secondary | ICD-10-CM | POA: Insufficient documentation

## 2011-04-04 DIAGNOSIS — IMO0001 Reserved for inherently not codable concepts without codable children: Secondary | ICD-10-CM | POA: Insufficient documentation

## 2011-04-04 DIAGNOSIS — M25519 Pain in unspecified shoulder: Secondary | ICD-10-CM | POA: Insufficient documentation

## 2011-04-04 DIAGNOSIS — M6281 Muscle weakness (generalized): Secondary | ICD-10-CM | POA: Insufficient documentation

## 2011-04-04 DIAGNOSIS — I10 Essential (primary) hypertension: Secondary | ICD-10-CM | POA: Insufficient documentation

## 2011-04-04 DIAGNOSIS — M25619 Stiffness of unspecified shoulder, not elsewhere classified: Secondary | ICD-10-CM | POA: Insufficient documentation

## 2011-04-04 NOTE — Progress Notes (Signed)
Occupational Therapy Evaluation  Patient Details  Name: Christina Carroll MRN: [id number] Date of Birth: [birth year]  Today's Date: 04/04/2011 Time: 1355-1440 OT Evaluation 1355-1430 35 ' Patient Education 1430-1440 10' Time Calculation (min): 45 min  Visit#: 1  of 12   Re-eval: 05/05/11  Assessment Diagnosis:  (Bilateral Rotator Cuff Syndrome) Surgical Date:  (N/A) Next MD Visit:  (No Known Date) Prior Therapy:  (None)  Past Medical History:  Past Medical History  Diagnosis Date  . Obesity   . Thyroid disorder   . Hyperlipidemia   . Hypertension   . Arthritis    Past Surgical History:  Past Surgical History  Procedure Date  . Lumpectomy removed from thoracic spine area 1999  . Bilateral foot surgery bone removed 2000    Subjective Symptoms/Limitations Symptoms: S: "The shoulders hurt a little and I don't want them to get any worse so I went to see my doctor, for preventative care" Limitations: Christina Carroll has been working at the [employer] for 26 yrs. 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 She has been experienceing pain in both shoulders intermittently over the past  few months. She arrives today for an occupational therapy evaluation to decrease pain and increase functional use of both shoulders. Pain Assessment Currently in Pain?: Yes Pain Score:   3 Pain Location: Shoulder Pain Orientation: Right;Left;Upper;Proximal Pain Type: Acute pain Pain Onset:  (2 months) Pain Frequency: Intermittent Effect of Pain on Daily Activities: Most of the deletrious affects happened with work. 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  Multiple Pain Sites: No  Precautions/Restrictions None    Prior Functioning  Home Living Lives With: Alone Receives Help From:  (Does not need help) Type of Home: House Home Layout: One level;Full bath on main level;Able to live on main level with  bedroom/bathroom Home Access: Stairs to enter Entrance Stairs-Rails: None Entrance Stairs-Number of Steps:  (2) Bathroom Shower/Tub: Engineer, manufacturing systems: Standard Bathroom Accessibility: Yes Home Adaptive Equipment: Bedside commode/3-in-1;Hand-held shower hose;Grab bars in shower Prior Function Level of Independence: Independent with basic ADLs;Independent with homemaking with ambulation Able to Take Stairs?: Yes Driving: Yes 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[employer] ; lift and carry)  Assessment ADL/Vision/Perception ADL Eating/Feeding: Independent Grooming: Independent Upper Body Bathing: Modified independent;Use of adaptive equipment Lower Body Bathing: Independent Upper Body Dressing: Independent Lower Body Dressing: Independent Toilet Transfer: Independent Toileting - Clothing Manipulation: Independent Toileting - Hygiene: Independent Tub/Shower Transfer: Independent Vision - History Baseline Vision: Wears glasses for distance only Vision - Assessment Eye Alignment: Within Functional Limits Perception Perception: Within Functional Limits Praxis Praxis: Intact  Cognition/Observation Cognition Overall Cognitive Status: Appears within functional limits for tasks assessed Arousal/Alertness: Awake/alert Orientation Level: Oriented X4  Sensation/Coordination/Edema Sensation Light Touch: Appears Intact Stereognosis: Appears Intact Hot/Cold: Appears Intact Proprioception: Appears Intact Coordination Gross Motor Movements are Fluid and Coordinated: Yes Fine Motor Movements are Fluid and Coordinated: Yes Edema Edema:  (None noted)  Additional Assessments RUE Assessment RUE Assessment: Exceptions to [facility] RUE AROM (degrees) Right Shoulder Flexion  0-170: 140 Degrees Right Shoulder ABduction 0-140: 140 Degrees Right Shoulder Internal Rotation  0-70: 60 Degrees Right Shoulder External Rotation  0-90: 20 Degrees LUE  Assessment LUE Assessment: Exceptions to [facility] LUE AROM (degrees) Left Shoulder Flexion  0-170: 165 Degrees Left Shoulder ABduction 0-40: 130 Degrees Left Shoulder Internal Rotation  0-70: 60 Degrees Left Shoulder External Rotation  0-90: 55 Degrees  Palpation Palpation:  (Tightness around the traps, and rotator cuff muscles R>L  )             Occupational Therapy Assessment and Plan OT Assessment and Plan Clinical Impression Statement: A: Christina Carroll demonstrates Decreased mobility, strength and functional use of BUE with increased pain on elevation above 120 degrees.   Rehab Potential: Excellent Clinical Impairments Affecting Rehab Potential: Pain OT Frequency: Min 3X/week OT Duration: 4 weeks OT Treatment/Interventions: Self-care/ADL training;Therapeutic exercise;Manual therapy;Modalities;Therapeutic activities;Patient/family education OT Plan: P: Skilled OT required to increase AROM, and strengthand decrease pain and dysfunction. OT will begin with Manual techniques MFR and AROM in Supine, move to  strengthening in  pain free range, increase scapular stability  and control pain for return to full functional use of BUE.   Goals Home Exercise Program Pt will Perform Home Exercise Program: Independently Short Term Goals Time to Complete Short Term Goals: 2 weeks Short Term Goal 1: Pain free AROM Of BUE down from 5/10 with movement. Short Term Goal 2: Increase Flexion and Abduction by 20 degrees Short Term Goal 3: Independence with HEP for light strengthening of rotator cuff and scapular stability. Short Term Goal 4: Patient will be able to use arms at chest height for ADL's without pain. Short Term Goal 5: Patient will be able to wash her hair without pain. Long Term Goals Time to Complete Long Term Goals: 4 weeks Long Term Goal 1: AROM to WNL for ability to reach for items above her head without pain. Long Term Goal 2: Pateint will state 1/10  to no pain on movement and use  down from 5/10 with use. Long Term Goal 3: 5/5 BUE strength up from4+/5 for ability to lift boxes of cards without pain. 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 Long Term Goal 5: Patient will reduce facial restrictions to min from Moderate.  Problem List Patient Active Problem List  Diagnoses  . UNSPECIFIED DISORDER OF THYROID  . HYPERLIPIDEMIA  . OBESITY  . CERUMEN IMPACTION, RIGHT  . HYPERTENSION  . ACUTE BRONCHITIS  . CELLULITIS  . BACK PAIN  . TENDINITIS  . INSOMNIA  . FATIGUE  . LEG EDEMA, BILATERAL  . NEVUS    End of Session Activity Tolerance: Patient tolerated treatment well General Behavior During Session: [facility] for tasks performed OT Plan of Care OT Home Exercise Plan: Patient educated on role of OT and course of treatment and is agreeable. Consulted and Agree with Plan of Care: Patient   Lisa Roca OTR/L 04/04/2011, 5:06 PM  Physician Documentation Your signature is required to indicate approval of the treatment plan as stated above.  Please sign and either send electronically or make a copy of this report for your files and return this physician signed original.  Please mark one 1.__approve of plan  2. ___approve of plan with the following conditions.   ______________________________                                                          _____________________ Physician Signature  Date  

## 2011-04-05 ENCOUNTER — Ambulatory Visit (HOSPITAL_COMMUNITY)
Admission: RE | Admit: 2011-04-05 | Payer: 59 | Source: Ambulatory Visit | Attending: Orthopedic Surgery | Admitting: Orthopedic Surgery

## 2011-04-06 ENCOUNTER — Ambulatory Visit (HOSPITAL_COMMUNITY)
Admission: RE | Admit: 2011-04-06 | Discharge: 2011-04-06 | Disposition: A | Discharge status: Home / Self Care | Payer: 59 | Source: Ambulatory Visit | Attending: Orthopedic Surgery | Admitting: Orthopedic Surgery

## 2011-04-06 ENCOUNTER — Ambulatory Visit (HOSPITAL_COMMUNITY)
Admission: RE | Admit: 2011-04-06 | Discharge: 2011-04-06 | Disposition: A | Discharge status: Home / Self Care | Payer: 59 | Source: Ambulatory Visit | Attending: Family Medicine | Admitting: Family Medicine

## 2011-04-06 DIAGNOSIS — K7689 Other specified diseases of liver: Secondary | ICD-10-CM | POA: Insufficient documentation

## 2011-04-06 DIAGNOSIS — R748 Abnormal levels of other serum enzymes: Secondary | ICD-10-CM | POA: Insufficient documentation

## 2011-04-06 NOTE — Progress Notes (Signed)
Occupational Therapy Treatment  Patient Details  Name: Christina Carroll MRN: [id number] Date of Birth: 1945/09/20  Today's Date: 04/06/2011 Time: 9147-8295 Manual Therapy 940-950 10' Therapeutic Exercise [phone] 25' Time Calculation (min): 35 min  Visit#: 2  of 12   Re-eval: 05/05/11    Subjective Symptoms/Limitations Symptoms: S: "It is not hurting as much because I stapped doing so much with my arms." Repetition: Decreases Symptoms Pain Assessment Currently in Pain?: Yes Pain Score:   1 Pain Orientation: Right;Left Pain Type: Acute pain Pain Radiating Towards: Right > Left Multiple Pain Sites: No  Precautions/Restrictions     Exercise/Treatments Supine Protraction: PROM Horizontal ABduction: PROM External Rotation: PROM Internal Rotation: PROM Flexion: PROM ABduction: PROM Seated Elevation: AROM;15 reps Extension: AROM;15 reps Retraction: AROM;15 reps Row: AROM;15 reps Therapy Ball Flexion: 25 reps ABduction: 25 reps Right/Left: 5 reps ROM / Strengthening / Isometric Strengthening UBE (Upper Arm Bike): 2' and 2' and 1 level   Manual Therapy Manual Therapy: Joint mobilization Joint Mobilization: Joint mobilization of shoulders gently  Occupational Therapy Assessment and Plan OT Assessment and Plan Clinical Impression Statement: A: Patient has decreased pain but has difficulty relaxing during the PROM and during the exercises for her HEP. Given table slides and scapular retraction and sacpular stability exercises. Rehab Potential: Excellent Clinical Impairments Affecting Rehab Potential: Patient has difficult time relaxing and has difficult time learning exercises OT Frequency: Min 3X/week OT Duration: 4 weeks OT Treatment/Interventions: Self-care/ADL training;Therapeutic exercise;DME and/or AE instruction;Manual therapy;Therapeutic activities;Patient/family education OT Plan: OT began MFR and PROM in supine and movement in pain free range. Increased  scapular stabiliity. Control pain for full functional return. Slowly increase strength.   Goals    Problem List Patient Active Problem List  Diagnoses  . UNSPECIFIED DISORDER OF THYROID  . HYPERLIPIDEMIA  . OBESITY  . CERUMEN IMPACTION, RIGHT  . HYPERTENSION  . ACUTE BRONCHITIS  . CELLULITIS  . BACK PAIN  . TENDINITIS  . INSOMNIA  . FATIGUE  . LEG EDEMA, BILATERAL  . NEVUS    End of Session Equipment Utilized During Treatment: Therapy ball Activity Tolerance: Patient tolerated treatment well General Behavior During Session: [facility] for tasks performed Cognition: [facility] for tasks performed OT Plan of Care OT Home Exercise Plan: Pateint educated on relaxation during PROM and AAROM and HEP and given ball exercises for PROM and scapular strengthening. Consulted and Agree with Plan of Care: Patient   Lisa Roca OTR/L 04/06/2011, 12:10 PM

## 2011-04-10 ENCOUNTER — Telehealth: Payer: Self-pay | Admitting: Family Medicine

## 2011-04-10 DIAGNOSIS — E785 Hyperlipidemia, unspecified: Secondary | ICD-10-CM

## 2011-04-10 NOTE — Telephone Encounter (Signed)
Addended by: Abner Greenspan on: 04/10/2011 02:15 PM   Modules accepted: Orders

## 2011-04-10 NOTE — Telephone Encounter (Signed)
Didn't see where i called pt. But i did order her ultra sound. But it has been completed

## 2011-04-10 NOTE — Progress Notes (Signed)
Pt aware.

## 2011-04-10 NOTE — Telephone Encounter (Signed)
Printed labs and she is aware of results

## 2011-04-10 NOTE — Telephone Encounter (Signed)
Called pt regarding labs and left message again to call me back

## 2011-04-11 ENCOUNTER — Ambulatory Visit (HOSPITAL_COMMUNITY)
Admission: RE | Admit: 2011-04-11 | Discharge: 2011-04-11 | Disposition: A | Discharge status: Home / Self Care | Payer: 59 | Source: Ambulatory Visit | Attending: Orthopedic Surgery | Admitting: Orthopedic Surgery

## 2011-04-11 NOTE — Progress Notes (Signed)
Occupational Therapy Treatment  Patient Details  Name: Christina Carroll MRN: [id number] Date of Birth: 09-27-1945  Today's Date: 04/11/2011 Time: 4098-1191 Manual Therapy 4782-9562 10' Therapeutic Exercise [phone] 30' Time Calculation (min): 40 min  Visit#: 3  of 12   Re-eval: 05/05/11    Subjective Symptoms/Limitations Symptoms: S: " Pain Assessment Currently in Pain?: Yes Pain Score:   2 Pain Location: Shoulder Pain Orientation: Right;Left Pain Type: Acute pain Pain Radiating Towards: Right greater than left right 2/10 and left 1/10 Pain Onset: More than a month ago Pain Frequency: Intermittent Multiple Pain Sites: No  Precautions/Restrictions  Respect pain and back off with c/o pain    Exercise/Treatments Supine Protraction: PROM;10 reps Horizontal ABduction: PROM;10 reps External Rotation: PROM;10 reps Internal Rotation: PROM;10 reps Flexion: PROM;10 reps ABduction: PROM;10 reps Seated Elevation: AROM;15 reps Extension: AROM;15 reps Retraction: AROM;15 reps Row: AROM;15 reps Protraction: Strengthening;15 reps;Theraband Theraband Level (Shoulder Protraction): Level 2 (Red) Flexion: Strengthening;Theraband;15 reps Theraband Level (Shoulder Flexion): Level 2 (Red) Respect pain and back off if patient has iRTherapy Ball Flexion: 20 reps ABduction: 20 reps ROM / Strengthening / Isometric Strengthening UBE (Upper Arm Bike): 3' and 3' 1.5    Occupational Therapy Assessment and Plan OT Assessment and Plan Clinical Impression Statement: A: Pateint doing better relaxing with shoulder mobilization and during HEP. Given t-band exercises and beginning wall exercises. Rehab Potential: Excellent Clinical Impairments Affecting Rehab Potential: Patient has difficult time relaxing and has difficult time learning exercises OT Frequency: Min 3X/week OT Treatment/Interventions: Therapeutic exercise;Self-care/ADL training;DME and/or AE instruction;Manual  therapy;Therapeutic activities;Patient/family education OT Plan: OT began MFR and AAROM in supine and movement in pain free range. Increased scapular stabiliity. Control pain for full functional return. Slowly increase strength.   Goals Short Term Goals Time to Complete Short Term Goals: 2 weeks Short Term Goal 1 Progress: Met Short Term Goal 2 Progress: Progressing toward goal Short Term Goal 3 Progress: Progressing toward goal Short Term Goal 4 Progress: Progressing toward goal Short Term Goal 5 Progress: Progressing toward goal  Problem List Patient Active Problem List  Diagnoses  . UNSPECIFIED DISORDER OF THYROID  . HYPERLIPIDEMIA  . OBESITY  . CERUMEN IMPACTION, RIGHT  . HYPERTENSION  . ACUTE BRONCHITIS  . CELLULITIS  . BACK PAIN  . TENDINITIS  . INSOMNIA  . FATIGUE  . LEG EDEMA, BILATERAL  . NEVUS    End of Session Equipment Utilized During Treatment: Therapy ball, red tband Activity Tolerance: Patient tolerated treatment well General Behavior During Session: [facility] for tasks performed Cognition: [facility] for tasks performed OT Plan of Care OT Home Exercise Plan: Pateint educated on relaxation during PROM and AAROM and HEP and given ball exercises for PROM and scapular strengthening. Consulted and Agree with Plan of Care: Patient   Lisa Roca OTR/L 04/11/2011, 2:49 PM

## 2011-04-12 ENCOUNTER — Ambulatory Visit (HOSPITAL_COMMUNITY)
Admission: RE | Admit: 2011-04-12 | Discharge: 2011-04-12 | Disposition: A | Discharge status: Home / Self Care | Payer: 59 | Source: Ambulatory Visit | Attending: Orthopedic Surgery | Admitting: Orthopedic Surgery

## 2011-04-12 NOTE — Progress Notes (Signed)
Occupational Therapy Treatment  Patient Details  Name: Christina Carroll MRN: [id number] Date of Birth: April 14, 1945  Today's Date: 04/12/2011 Time: 1520-1600 Time Calculation (min): 40 min  Visit#: 4  of 12   Re-eval: 05/05/11    Subjective Symptoms/Limitations Symptoms: S: It feels better since I've been taking my ibuprophin. Repetition: Decreases Symptoms Pain Assessment Currently in Pain?: Yes Pain Score:   2 Pain Location: Shoulder Pain Orientation: Right;Left Pain Type: Acute pain Pain Onset: More than a month ago Pain Frequency: Intermittent  Precautions/Restrictions     Exercise/Treatments Supine Protraction: PROM;10 reps;AAROM External Rotation: PROM;10 reps Internal Rotation: PROM;10 reps Flexion: PROM;10 reps ABduction: PROM;10 reps Seated Elevation: AROM;Strengthening;10 reps;Theraband Theraband Level (Shoulder Elevation): Level 2 (Red) Extension: Strengthening;10 reps;Theraband Theraband Level (Shoulder Extension): Level 2 (Red) Retraction: Strengthening;10 reps;Theraband Theraband Level (Shoulder Retraction): Level 2 (Red) Row: Strengthening;10 reps;Theraband Theraband Level (Shoulder Row): Level 2 (Red) Pulleys Flexion: 1 minute Therapy Ball Flexion: 20 reps ABduction: 20 reps ROM / Strengthening / Isometric Strengthening UBE (Upper Arm Bike): 3 and 3 on1 Wall Wash: 1'         Occupational Therapy Assessment and Plan OT Assessment and Plan Clinical Impression Statement: A; Given t-band exercises for home. Not as much pain with movement. Needs to learn supine can exercises next. Rehab Potential: Excellent OT Frequency: Min 3X/week OT Duration: 4 weeks OT Treatment/Interventions: Self-care/ADL training;Therapeutic exercise;Therapeutic activities;Patient/family education;Modalities;Manual therapy OT Plan: Cont POC      Problem List Patient Active Problem List  Diagnoses  . UNSPECIFIED DISORDER OF THYROID  . HYPERLIPIDEMIA  .  OBESITY  . CERUMEN IMPACTION, RIGHT  . HYPERTENSION  . ACUTE BRONCHITIS  . CELLULITIS  . BACK PAIN  . TENDINITIS  . INSOMNIA  . FATIGUE  . LEG EDEMA, BILATERAL  . NEVUS    End of Session Activity Tolerance: Patient tolerated treatment well General Behavior During Session: [facility] for tasks performed Cognition: [facility] for tasks performed   Lisa Roca OTR/L 04/12/2011, 7:03 PM

## 2011-04-13 ENCOUNTER — Ambulatory Visit (HOSPITAL_COMMUNITY)
Admission: RE | Admit: 2011-04-13 | Discharge: 2011-04-13 | Disposition: A | Discharge status: Home / Self Care | Payer: 59 | Source: Ambulatory Visit | Attending: Family Medicine | Admitting: Family Medicine

## 2011-04-13 NOTE — Progress Notes (Signed)
Occupational Therapy Treatment  Patient Details  Name: Christina Carroll MRN: [id number] Date of Birth: 1945/08/30  Today's Date: 04/13/2011 Time: 1103-1203 Time Calculation (min): 60 min Manual Therapy [id number] 30 ' Therapeutic Exercise 1133-1203 30' Visit#: 5  of 12   Re-eval: 05/05/11   Subjective Symptoms/Limitations Symptoms: S: "I feel OK no pain, I took my pill this morning." Pain Assessment Currently in Pain?: No/denies  Precautions/Restrictions     Exercise/Treatments Supine Protraction: PROM;5 reps;AROM;10 reps Horizontal ABduction: PROM;5 reps;AROM;10 reps External Rotation: PROM;5 reps;AROM;10 reps Internal Rotation: PROM;5 reps;AROM;10 reps Flexion: PROM;5 reps;AAROM;10 reps ABduction: PROM;5 reps Seated Elevation: AROM;Strengthening;10 reps;Theraband Theraband Level (Shoulder Elevation): Level 2 (Red) Extension: Strengthening;10 reps;Theraband Theraband Level (Shoulder Extension): Level 2 (Red) Retraction: Strengthening;10 reps;Theraband Theraband Level (Shoulder Retraction): Level 2 (Red) Row: Strengthening;10 reps;Theraband Theraband Level (Shoulder Row): Level 2 (Red) Protraction: Strengthening;15 reps;Theraband Theraband Level (Shoulder Protraction): Level 2 (Red) Therapy Ball Flexion: 10 reps ABduction: 10 reps Right/Left: 10 reps ROM / Strengthening / Isometric Strengthening UBE (Upper Arm Bike): 3 and 3   Manual Therapy Manual Therapy: Joint mobilization Joint Mobilization: Joint mobs gentle to shoulders. both  Occupational Therapy Assessment and Plan OT Assessment and Plan Clinical Impression Statement: A: went over tband exercises again in seated position. pateint unable to progress to cane exercises due to illiciting pain.  trained in self AAROM from supine. Rehab Potential: Excellent OT Frequency: Min 3X/week OT Duration: 4 weeks OT Treatment/Interventions: Therapeutic exercise;Self-care/ADL training;DME and/or AE  instruction;Manual therapy;Therapeutic activities;Patient/family education OT Plan: Cont. POC and attempt use of cane in supine and try to progress to light 1# wt. in supine.   Goals    Problem List Patient Active Problem List  Diagnoses  . UNSPECIFIED DISORDER OF THYROID  . HYPERLIPIDEMIA  . OBESITY  . CERUMEN IMPACTION, RIGHT  . HYPERTENSION  . ACUTE BRONCHITIS  . CELLULITIS  . BACK PAIN  . TENDINITIS  . INSOMNIA  . FATIGUE  . LEG EDEMA, BILATERAL  . NEVUS    End of Session Activity Tolerance: Patient tolerated treatment well General Behavior During Session: [facility] for tasks performed Cognition: [facility] for tasks performed   Lisa Roca OTR/L 04/13/2011, 12:06 PM

## 2011-04-18 ENCOUNTER — Ambulatory Visit (HOSPITAL_COMMUNITY)
Admission: RE | Admit: 2011-04-18 | Discharge: 2011-04-18 | Disposition: A | Discharge status: Home / Self Care | Payer: 59 | Source: Ambulatory Visit | Attending: Family Medicine | Admitting: Family Medicine

## 2011-04-18 NOTE — Progress Notes (Signed)
Occupational Therapy Treatment  Patient Details  Name: Christina Carroll MRN: [id number] Date of Birth: 05/06/1945  Today's Date: 04/18/2011 Time: 4098-1191 Time Calculation (min): 57 min  Visit#: 6  of 12   Re-eval: 05/05/11 Manual Therapy 4782-9562 32' Therapeutic Exercise [id number] 24'    Subjective Symptoms/Limitations Symptoms: S:  I was backing in a parking space and it hit me that it did not hurt and I did not hurt. Pain Assessment Currently in Pain?: No/denies  Precautions/Restrictions     Exercise/Treatments   04/18/11 1136 Shoulder Exercises: Supine Protraction PROM;10 reps;AAROM;12 reps Horizontal ABduction PROM;10 reps;AAROM;12 reps External Rotation PROM;10 reps;AAROM;12 reps Internal Rotation PROM;10 reps;AAROM;12 reps Flexion PROM;10 reps;AAROM;12 reps ABduction PROM;10 reps;AAROM;12 reps Shoulder Exercises: Seated Extension Theraband;12 reps Theraband Level (Shoulder Extension) Level 3 (Green) Retraction Theraband;12 reps Theraband Level (Shoulder Retraction) Level 3 (Green) Row [employer] reps Theraband Level (Shoulder Row) Level 3 (Green) External Rotation Theraband;12 reps Theraband Level (Shoulder External Rotation) Level 3 (Green) Internal Rotation Theraband;12 reps Theraband Level (Shoulder Internal Rotation) Level 3 (Green) Shoulder Exercises: Therapy Ball Flexion 15 reps ABduction 15 reps Right/Left 5 reps Shoulder Exercises: ROM/Strengthening UBE (Upper Arm Bike) 3 and 3 2.0 Wall Wash 2'     Manual Therapy Manual Therapy: Myofascial release Myofascial Release: MFR and manual stretching to bilateral shoulders to decrease restrictions and pain to increase functional AROM.  Occupational Therapy Assessment and Plan OT Assessment and Plan Clinical Impression Statement: A: Increased wall wash time. Rehab Potential: Excellent OT Plan: P: Add thumbtacks.   Goals Home Exercise Program Pt will Perform Home Exercise Program:  Independently Short Term Goals Time to Complete Short Term Goals: 2 weeks Short Term Goal 1: Pain free AROM Of BUE down from 5/10 with movement. Short Term Goal 2: Increase Flexion and Abduction by 20 degrees Short Term Goal 3: Independence with HEP for light strengthening of rotator cuff and scapular stability. Short Term Goal 4: Patient will be able to use arms at chest height for ADL's without pain. Short Term Goal 5: Patient will be able to wash her hair without pain. Long Term Goals Time to Complete Long Term Goals: 4 weeks Long Term Goal 1: AROM to WNL for ability to reach for items above her head without pain. Long Term Goal 2: Pateint will state 1/10  to no pain on movement and use down from 5/10 with use. Long Term Goal 3: 5/5 BUE strength up from4+/5 for ability to lift boxes of cards without pain. 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 Long Term Goal 5: Patient will reduce facial restrictions to min from Moderate.  Problem List Patient Active Problem List  Diagnoses  . UNSPECIFIED DISORDER OF THYROID  . HYPERLIPIDEMIA  . OBESITY  . CERUMEN IMPACTION, RIGHT  . HYPERTENSION  . ACUTE BRONCHITIS  . CELLULITIS  . BACK PAIN  . TENDINITIS  . INSOMNIA  . FATIGUE  . LEG EDEMA, BILATERAL  . NEVUS    End of Session Activity Tolerance: Patient tolerated treatment well General Behavior During Session: [facility] for tasks performed Cognition: [facility] for tasks performed    Raylene Carmickle L. Noralee Stain, COTA/L  04/18/2011, 4:25 PM

## 2011-04-19 ENCOUNTER — Ambulatory Visit (HOSPITAL_COMMUNITY)
Admission: RE | Admit: 2011-04-19 | Discharge: 2011-04-19 | Disposition: A | Discharge status: Home / Self Care | Payer: 59 | Source: Ambulatory Visit | Attending: Family Medicine | Admitting: Family Medicine

## 2011-04-19 NOTE — Progress Notes (Signed)
Occupational Therapy Treatment  Patient Details  Name: Christina Carroll MRN: [id number] Date of Birth: 1945/08/12  Today's Date: 04/19/2011 Time: 0865-7846 Manual Therapy 1345-1355 10' Therapeutic Exercises [phone] 78' Time Calculation (min): 50 min  Visit#: 7  of 12   Re-eval: 05/05/11   Subjective Symptoms/Limitations Symptoms: S: I took a pain pill because I was really sore yesterday. I was able to put my arm on the back of my car seat for the first time in a long while. Repetition: Decreases Symptoms Pain Assessment Currently in Pain?: Yes Pain Score:   1 Pain Location: Shoulder Pain Orientation: Right;Left Pain Type: Acute pain Pain Onset: More than a month ago Pain Frequency: Intermittent  Precautions/Restrictions     Exercise/Treatments Supine Protraction: PROM;10 reps;AAROM;12 reps Horizontal ABduction: PROM;10 reps;AAROM;12 reps External Rotation: PROM;10 reps;AAROM;12 reps Internal Rotation: PROM;10 reps;AAROM;12 reps Flexion: PROM;10 reps;AAROM;12 reps ABduction: PROM;10 reps;AAROM;12 reps Seated Elevation: AROM;Strengthening;10 reps;Theraband Theraband Level (Shoulder Elevation): Level 3 (Green) Extension: Theraband;12 reps Theraband Level (Shoulder Extension): Level 3 (Green) Retraction: Theraband;12 reps Theraband Level (Shoulder Retraction): Level 3 (Green) Row: Theraband;10 reps Theraband Level (Shoulder Row): Level 3 (Green) Protraction: Strengthening;15 reps;Theraband Theraband Level (Shoulder Protraction): Level 3 (Green) Horizontal ABduction: Strengthening;12 reps;Theraband Theraband Level (Shoulder Horizontal ABduction): Level 3 (Green) External Rotation: Theraband;12 reps Theraband Level (Shoulder External Rotation): Level 3 (Green) Internal Rotation: Theraband;12 reps Theraband Level (Shoulder Internal Rotation): Level 3 (Green)   Therapy Ball Flexion: 25 reps ABduction: 25 reps Right/Left: 5 reps ROM / Strengthening / Isometric  Strengthening UBE (Upper Arm Bike): 3 and 3 2.0  Manual Therapy Manual Therapy: Joint mobilization Joint Mobilization: Gentle joint mobilization to shoulder to end ranges  Occupational Therapy Assessment and Plan OT Assessment and Plan Clinical Impression Statement: A: Pateint able to do much more movement without pain and increased PROM of shoulders bilaterally. Rehab Potential: Excellent Clinical Impairments Affecting Rehab Potential: Much better ability to relax and learning exercises nicely now. OT Frequency: Min 3X/week OT Duration: 4 weeks OT Treatment/Interventions: Therapeutic exercise;Self-care/ADL training;Therapeutic activities;Manual therapy;Patient/family education;Modalities OT Plan: P: Did not have time for thumb tacks today. Continue with addition of cane exercises and addition of more tband exercises.   Goals Home Exercise Program Pt will Perform Home Exercise Program: Independently Short Term Goals Time to Complete Short Term Goals: 2 weeks Short Term Goal 1 Progress: Met Short Term Goal 2 Progress: Met Short Term Goal 3 Progress: Progressing toward goal Short Term Goal 4 Progress: Progressing toward goal Short Term Goal 5 Progress: Progressing toward goal  Problem List Patient Active Problem List  Diagnoses  . UNSPECIFIED DISORDER OF THYROID  . HYPERLIPIDEMIA  . OBESITY  . CERUMEN IMPACTION, RIGHT  . HYPERTENSION  . ACUTE BRONCHITIS  . CELLULITIS  . BACK PAIN  . TENDINITIS  . INSOMNIA  . FATIGUE  . LEG EDEMA, BILATERAL  . NEVUS    End of Session Equipment Utilized During Treatment: Therapy ball, green tband, UBE Activity Tolerance: Patient tolerated treatment well General Behavior During Session: [facility] for tasks performed Cognition: [facility] for tasks performed   Lisa Roca OTR/L 04/19/2011, 2:59 PM

## 2011-04-20 ENCOUNTER — Ambulatory Visit (HOSPITAL_COMMUNITY)
Admission: RE | Admit: 2011-04-20 | Discharge: 2011-04-20 | Disposition: A | Discharge status: Home / Self Care | Payer: 59 | Source: Ambulatory Visit | Attending: Family Medicine | Admitting: Family Medicine

## 2011-04-20 NOTE — Progress Notes (Signed)
Occupational Therapy Treatment  Patient Details  Name: ALELI NAVEDO MRN: [id number] Date of Birth: 07-09-1945  Today's Date: 04/20/2011 Time: 1015-1100 Manual therapy 4098-1191 20 ' Therapeutic Exercise 1035-1100 25' Time Calculation (min): 45 min  Visit#: 8  of 12   Re-eval: 05/05/11    Subjective Symptoms/Limitations Symptoms: S: I feel a little sore today. (Patient had therapy just yesterday with no break between sessions) Pain Assessment Currently in Pain?: Yes Pain Location: Shoulder Pain Orientation: Left;Right Pain Frequency: Intermittent  Precautions/Restrictions     Exercise/Treatments Supine Protraction: PROM;10 reps;AAROM;12 reps Horizontal ABduction: PROM;10 reps;AAROM;12 reps External Rotation: PROM;10 reps;AAROM;12 reps Internal Rotation: PROM;10 reps;AAROM;12 reps Flexion: PROM;10 reps;AAROM;12 reps ABduction: PROM;10 reps;AAROM;12 reps Seated Elevation: AROM;15 reps Extension: Theraband;12 reps Theraband Level (Shoulder Extension): Level 4 (Blue) Retraction: Theraband;12 reps Theraband Level (Shoulder Retraction): Level 4 (Blue) Row: Theraband;10 reps Theraband Level (Shoulder Row): Level 4 (Blue) Therapy Ball Flexion: 25 reps ABduction: 25 reps ROM / Strengthening / Isometric Strengthening UBE (Upper Arm Bike): 3 and 3 1.0 Wall Wash: 2'   Manual Therapy Manual Therapy: Joint mobilization Joint Mobilization: gentle joint mob to end range in supine positions.  Occupational Therapy Assessment and Plan OT Assessment and Plan Clinical Impression Statement: A: Patient doing much better with increased mobility free of pain by end of session. Rehab Potential: Excellent OT Frequency: Min 3X/week OT Duration: 4 weeks OT Treatment/Interventions: Therapeutic exercise;Manual therapy;Modalities;Therapeutic activities;Patient/family education OT Plan: P: Continue to work on scapular strengthening and Tband for strengthening of RC muscles.    Goals Short Term Goals Time to Complete Short Term Goals: 2 weeks Short Term Goal 1 Progress: Met Short Term Goal 2 Progress: Met Short Term Goal 3 Progress: Met Short Term Goal 4 Progress: Progressing toward goal Short Term Goal 5 Progress: Met  Problem List Patient Active Problem List  Diagnoses  . UNSPECIFIED DISORDER OF THYROID  . HYPERLIPIDEMIA  . OBESITY  . CERUMEN IMPACTION, RIGHT  . HYPERTENSION  . ACUTE BRONCHITIS  . CELLULITIS  . BACK PAIN  . TENDINITIS  . INSOMNIA  . FATIGUE  . LEG EDEMA, BILATERAL  . NEVUS    End of Session Equipment Utilized During Treatment: Therapy ball, blue tband, UBE Activity Tolerance: Patient tolerated treatment well General Behavior During Session: [facility] for tasks performed Cognition: [facility] for tasks performed   Lisa Roca OTR/L 04/20/2011, 11:02 AM

## 2011-04-21 ENCOUNTER — Ambulatory Visit (HOSPITAL_COMMUNITY): Payer: 59 | Admitting: Occupational Therapy

## 2011-04-25 ENCOUNTER — Ambulatory Visit (HOSPITAL_COMMUNITY): Payer: 59 | Admitting: Occupational Therapy

## 2011-04-26 ENCOUNTER — Ambulatory Visit (HOSPITAL_COMMUNITY)
Admission: RE | Admit: 2011-04-26 | Discharge: 2011-04-26 | Disposition: A | Discharge status: Home / Self Care | Payer: 59 | Source: Ambulatory Visit | Attending: Family Medicine | Admitting: Family Medicine

## 2011-04-26 DIAGNOSIS — IMO0001 Reserved for inherently not codable concepts without codable children: Secondary | ICD-10-CM | POA: Insufficient documentation

## 2011-04-26 DIAGNOSIS — E785 Hyperlipidemia, unspecified: Secondary | ICD-10-CM | POA: Insufficient documentation

## 2011-04-26 DIAGNOSIS — M25619 Stiffness of unspecified shoulder, not elsewhere classified: Secondary | ICD-10-CM | POA: Insufficient documentation

## 2011-04-26 DIAGNOSIS — M6281 Muscle weakness (generalized): Secondary | ICD-10-CM | POA: Insufficient documentation

## 2011-04-26 DIAGNOSIS — I10 Essential (primary) hypertension: Secondary | ICD-10-CM | POA: Insufficient documentation

## 2011-04-26 DIAGNOSIS — M25519 Pain in unspecified shoulder: Secondary | ICD-10-CM | POA: Insufficient documentation

## 2011-04-26 NOTE — Progress Notes (Signed)
Occupational Therapy Treatment  Patient Details  Name: VAIDEHI BRADDY MRN: [id number] Date of Birth: 24-Dec-1945  Today's Date: 04/26/2011 Time: 4098-1191 Time Calculation (min): 48 min  Visit#: 9  of 12   Re-eval: 05/05/11 Manual Therapy [phone] Therapeutic Exercise [id number]    Subjective Symptoms/Limitations Symptoms: S: I feel 65 today, sat I felt 55 and now my arm hurts.  I cooked a lot for super bowl. Pain Assessment Currently in Pain?: Yes Pain Score:   3 Pain Location: Shoulder Pain Orientation: Right Pain Type: Acute pain  Precautions/Restrictions     Exercise/Treatments   04/26/11 0913  Shoulder Exercises: Supine  Protraction PROM;10 reps;AAROM;12 reps  Horizontal ABduction PROM;10 reps;AAROM;12 reps  External Rotation PROM;10 reps;AAROM;12 reps  Internal Rotation PROM;10 reps;AAROM;12 reps  Flexion PROM;10 reps;AAROM;12 reps  ABduction PROM;10 reps;AAROM;12 reps  Shoulder Exercises: Seated  Extension Theraband;12 reps  Theraband Level (Shoulder Extension) Level 4 (Blue)  Retraction Theraband;12 reps  Theraband Level (Shoulder Retraction) Level 4 (Blue)  Row Theraband;10 reps  Theraband Level (Shoulder Row) Level 4 (Blue)  Shoulder Exercises: Therapy Ball  Flexion 25 reps  ABduction 25 reps  Right/Left 5 reps  Shoulder Exercises: ROM/Strengthening  UBE (Upper Arm Bike) (unavailable)  Wall Wash 3'      Manual Therapy Manual Therapy: Myofascial release Myofascial Release: MFR and manual stretching to bilateral shoulders to decrese restrictions and pain to increase functional AROM.  Occupational Therapy Assessment and Plan OT Assessment and Plan Clinical Impression Statement: A:  Increased wall wash time. Rehab Potential: Excellent OT Plan: P:  Attempt AROM supine.   Goals Home Exercise Program Pt will Perform Home Exercise Program: Independently Short Term Goals Time to Complete Short Term Goals: 2 weeks Short Term Goal 1: Pain free AROM  Of BUE down from 5/10 with movement. Short Term Goal 2: Increase Flexion and Abduction by 20 degrees Short Term Goal 3: Independence with HEP for light strengthening of rotator cuff and scapular stability. Short Term Goal 4: Patient will be able to use arms at chest height for ADL's without pain. Short Term Goal 5: Patient will be able to wash her hair without pain. Long Term Goals Time to Complete Long Term Goals: 4 weeks Long Term Goal 1: AROM to WNL for ability to reach for items above her head without pain. Long Term Goal 2: Pateint will state 1/10  to no pain on movement and use down from 5/10 with use. Long Term Goal 3: 5/5 BUE strength up from4+/5 for ability to lift boxes of cards without pain. 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 Long Term Goal 5: Patient will reduce facial restrictions to min from Moderate.  Problem List Patient Active Problem List  Diagnoses  . UNSPECIFIED DISORDER OF THYROID  . HYPERLIPIDEMIA  . OBESITY  . CERUMEN IMPACTION, RIGHT  . HYPERTENSION  . ACUTE BRONCHITIS  . CELLULITIS  . BACK PAIN  . TENDINITIS  . INSOMNIA  . FATIGUE  . LEG EDEMA, BILATERAL  . NEVUS    End of Session Activity Tolerance: Patient tolerated treatment well General Behavior During Session: [facility] for tasks performed Cognition: [facility] for tasks performed   Detric Scalisi L. Raechell Singleton, COTA/L  04/26/2011, 4:08 PM

## 2011-04-27 ENCOUNTER — Ambulatory Visit (HOSPITAL_COMMUNITY)
Admission: RE | Admit: 2011-04-27 | Discharge: 2011-04-27 | Disposition: A | Discharge status: Home / Self Care | Payer: 59 | Source: Ambulatory Visit | Attending: Family Medicine | Admitting: Family Medicine

## 2011-04-27 NOTE — Progress Notes (Signed)
Occupational Therapy Treatment  Patient Details  Name: Christina Carroll MRN: [id number] Date of Birth: Nov 16, 1945  Today's Date: 04/27/2011 Time: 1105-1150 Time Calculation (min): 45 min Moist heat 10 min R shoulder Manual Therapy  5621-3086  10' Therapeutic Exercise 1115- 1150  35' Visit#: 10  of 12   Re-eval: 05/05/11    Subjective Symptoms/Limitations Symptoms: S: I woke up with it at a 7/10 and now it is a 4/10. I don't know if I slept on it wrong or something. Repetition: Increases Symptoms Pain Assessment Currently in Pain?: Yes Pain Score:   4 Pain Location: Shoulder Pain Orientation: Right Pain Type: Acute pain Pain Onset: More than a month ago Pain Frequency: Intermittent Pain Relieving Factors: Rest Effect of Pain on Daily Activities: Reaching and use of RUE shoulder Multiple Pain Sites: No  Precautions/Restrictions     Exercise/Treatments Supine Protraction: PROM;10 reps;AAROM;12 reps Horizontal ABduction: PROM;10 reps;AAROM;12 reps External Rotation: PROM;10 reps;AAROM;12 reps Internal Rotation: PROM;10 reps;AAROM;12 reps Flexion: PROM;10 reps;AAROM;12 reps ABduction: PROM;10 reps;AAROM;12 reps Therapy Ball Flexion: 25 reps ABduction: 25 reps ROM / Strengthening / Isometric Strengthening UBE (Upper Arm Bike): 3 and 3 1.0 Wall Wash: 2  Modalities Modalities: Moist Heat Manual Therapy Joint Mobilization: Gentle joint mobilizations for light stretch. Moist Heat Therapy Number Minutes Moist Heat: 10 Minutes Moist Heat Location: Shoulder;Other (comment) (right only)  Occupational Therapy Assessment and Plan OT Assessment and Plan Clinical Impression Statement: A: Went easy today due to patient having pain in Right shoulder. Patient started with 5/10 pain and ended with no pain. Rehab Potential: Excellent OT Frequency: Min 3X/week OT Duration: 4 weeks OT Treatment/Interventions: Self-care/ADL training;Therapeutic exercise;Therapeutic  activities;Modalities;Manual therapy;Patient/family education OT Plan: P: Left shoulder able to do 1 lb strengthening in supine. Did well with wall wash using nerf ball. Adjusted session to allow RIhgt UE shoulder to relax a bit more.   Goals Home Exercise Program Pt will Perform Home Exercise Program: Independently Short Term Goals Time to Complete Short Term Goals: 2 weeks Short Term Goal 1: Pain free AROM Of BUE down from 5/10 with movement. Short Term Goal 2: Increase Flexion and Abduction by 20 degrees Short Term Goal 2 Progress: Met Short Term Goal 3: Independence with HEP for light strengthening of rotator cuff and scapular stability. Short Term Goal 3 Progress: Met Short Term Goal 4: Patient will be able to use arms at chest height for ADL's without pain. Short Term Goal 4 Progress: Progressing toward goal Short Term Goal 5: Patient will be able to wash her hair without pain. Long Term Goals Time to Complete Long Term Goals: 4 weeks Long Term Goal 1: AROM to WNL for ability to reach for items above her head without pain. Long Term Goal 2: Pateint will state 1/10  to no pain on movement and use down from 5/10 with use. Long Term Goal 3: 5/5 BUE strength up from4+/5 for ability to lift boxes of cards without pain. 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 Long Term Goal 5: Patient will reduce facial restrictions to min from Moderate.  Problem List Patient Active Problem List  Diagnoses  . UNSPECIFIED DISORDER OF THYROID  . HYPERLIPIDEMIA  . OBESITY  . CERUMEN IMPACTION, RIGHT  . HYPERTENSION  . ACUTE BRONCHITIS  . CELLULITIS  . BACK PAIN  . TENDINITIS  . INSOMNIA  . FATIGUE  . LEG EDEMA, BILATERAL  . NEVUS    End of Session  Activity Tolerance: Patient tolerated treatment well General Behavior During Session: [facility] for tasks performed Cognition: [facility] for tasks performed   Lisa Roca  OTR/L 04/27/2011, 11:50 AM

## 2011-04-28 ENCOUNTER — Ambulatory Visit (HOSPITAL_COMMUNITY)
Admission: RE | Admit: 2011-04-28 | Discharge: 2011-04-28 | Disposition: A | Discharge status: Home / Self Care | Payer: 59 | Source: Ambulatory Visit | Attending: Family Medicine | Admitting: Family Medicine

## 2011-04-28 NOTE — Progress Notes (Signed)
Occupational Therapy Treatment  Patient Details  Name: Christina Carroll MRN: [id number] Date of Birth: [birth year]  Today's Date: 04/28/2011 Time: 0865-7846 Time Calculation (min): 55 min  Visit#: 11  of 12   Re-eval: 05/05/11 Manual Therapy 244-301 17' Therapeutic Exercise 302-339 37'    Subjective Symptoms/Limitations Symptoms: S:  I am better than I was yesterday.  Precautions/Restrictions     Exercise/Treatments Supine Protraction: PROM;AROM;10 reps Horizontal ABduction: PROM;AROM;10 reps External Rotation: PROM;AROM;10 reps Internal Rotation: PROM;AROM;10 reps Flexion: PROM;AROM;10 reps ABduction: PROM;AROM;10 reps Seated Extension: Theraband;12 reps Theraband Level (Shoulder Extension): Level 2 (Red) Retraction: Theraband;12 reps Theraband Level (Shoulder Retraction): Level 2 (Red) Row: Theraband;10 reps Theraband Level (Shoulder Row): Level 2 (Red) External Rotation: Theraband;12 reps Theraband Level (Shoulder External Rotation): Level 2 (Red) Internal Rotation: Theraband;12 reps Theraband Level (Shoulder Internal Rotation): Level 2 (Red) Flexion: Theraband;12 reps Theraband Level (Shoulder Flexion): Level 2 (Red) Therapy Ball Flexion: 20 reps ABduction: 20 reps Right/Left: 5 reps ROM / Strengthening / Isometric Strengthening UBE (Upper Arm Bike): 3 and 3 2.0 Wall Wash: 2       Manual Therapy Manual Therapy: Myofascial release Myofascial Release: MFR and manual stretching to bilateral shoulders to decrease restrictions and pain to increase functional AROM.  Occupational Therapy Assessment and Plan OT Assessment and Plan Clinical Impression Statement: A:  Resumed all ex today with no increase in pain. Rehab Potential: Excellent OT Plan: P:  Add seated AROM   Goals Home Exercise Program Pt will Perform Home Exercise Program: Independently Short Term Goals Time to Complete Short Term Goals: 2 weeks Short Term Goal 1: Pain free AROM Of BUE  down from 5/10 with movement. Short Term Goal 2: Increase Flexion and Abduction by 20 degrees Short Term Goal 3: Independence with HEP for light strengthening of rotator cuff and scapular stability. Short Term Goal 4: Patient will be able to use arms at chest height for ADL's without pain. Short Term Goal 5: Patient will be able to wash her hair without pain. Long Term Goals Time to Complete Long Term Goals: 4 weeks Long Term Goal 1: AROM to WNL for ability to reach for items above her head without pain. Long Term Goal 2: Pateint will state 1/10  to no pain on movement and use down from 5/10 with use. Long Term Goal 3: 5/5 BUE strength up from4+/5 for ability to lift boxes of cards without pain. 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 Long Term Goal 5: Patient will reduce facial restrictions to min from Moderate.  Problem List Patient Active Problem List  Diagnoses  . UNSPECIFIED DISORDER OF THYROID  . HYPERLIPIDEMIA  . OBESITY  . CERUMEN IMPACTION, RIGHT  . HYPERTENSION  . ACUTE BRONCHITIS  . CELLULITIS  . BACK PAIN  . TENDINITIS  . INSOMNIA  . FATIGUE  . LEG EDEMA, BILATERAL  . NEVUS    End of Session Activity Tolerance: Patient tolerated treatment well General Behavior During Session: [facility] for tasks performed Cognition: [facility] for tasks performed   Vanassa Penniman L. Noralee Stain, COTA/L  04/28/2011, 4:29 PM

## 2011-05-01 ENCOUNTER — Ambulatory Visit (HOSPITAL_COMMUNITY): Payer: 59 | Admitting: Occupational Therapy

## 2011-05-02 ENCOUNTER — Ambulatory Visit (HOSPITAL_COMMUNITY)
Admission: RE | Admit: 2011-05-02 | Discharge: 2011-05-02 | Disposition: A | Discharge status: Home / Self Care | Payer: 59 | Source: Ambulatory Visit | Attending: Family Medicine | Admitting: Family Medicine

## 2011-05-02 NOTE — Progress Notes (Signed)
Occupational Therapy Treatment  Patient Details  Name: Christina Carroll MRN: [id number] Date of Birth: Oct 31, 1945  Today's Date: 05/02/2011 Time: 6578-4696 Time Calculation (min): 54 min  Visit#: 12  of 12   Re-eval: 05/05/11 Manual Therapy 102-125 23' Therapeutic Exercise 126-156 30'    Subjective Symptoms/Limitations Symptoms: S:  My arms have been hurting but I took a pain pill.  Hurts just a little. Pain Assessment Currently in Pain?: Yes Pain Score:   2 Pain Location: Shoulder Pain Orientation: Right  Precautions/Restrictions     Exercise/Treatments Supine Protraction: PROM;10 reps;AROM;15 reps Horizontal ABduction: PROM;AROM;10 reps;15 reps External Rotation: PROM;10 reps;AROM;15 reps Internal Rotation: PROM;10 reps;AROM;15 reps Flexion: PROM;10 reps;AROM;15 reps ABduction: PROM;10 reps;AROM;15 reps Seated Protraction: AROM;10 reps Horizontal ABduction: AROM;10 reps External Rotation: AROM;10 reps Internal Rotation: AROM;10 reps Flexion: AROM;10 reps Abduction: AROM;10 reps Therapy Ball Flexion: 20 reps ABduction: 20 reps Right/Left: 5 reps ROM / Strengthening / Isometric Strengthening UBE (Upper Arm Bike): 3 and 3 2.0 Wall Wash: 4' Thumb Tacks: 1'    Manual Therapy Manual Therapy: Myofascial release Myofascial Release: MFR and manual stretching to bilateral shoulders to decrease restrictions and pain to increase functional AROM.  Occupational Therapy Assessment and Plan OT Assessment and Plan Clinical Impression Statement: A: Added seated AROM  Rehab Potential: Excellent OT Plan: P: REASSESS   Goals Home Exercise Program Pt will Perform Home Exercise Program: Independently Short Term Goals Time to Complete Short Term Goals: 2 weeks Short Term Goal 1: Pain free AROM Of BUE down from 5/10 with movement. Short Term Goal 2: Increase Flexion and Abduction by 20 degrees Short Term Goal 3: Independence with HEP for light strengthening of rotator  cuff and scapular stability. Short Term Goal 4: Patient will be able to use arms at chest height for ADL's without pain. Short Term Goal 5: Patient will be able to wash her hair without pain. Long Term Goals Time to Complete Long Term Goals: 4 weeks Long Term Goal 1: AROM to WNL for ability to reach for items above her head without pain. Long Term Goal 2: Pateint will state 1/10  to no pain on movement and use down from 5/10 with use. Long Term Goal 3: 5/5 BUE strength up from4+/5 for ability to lift boxes of cards without pain. 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 Long Term Goal 5: Patient will reduce facial restrictions to min from Moderate.  Problem List Patient Active Problem List  Diagnoses  . UNSPECIFIED DISORDER OF THYROID  . HYPERLIPIDEMIA  . OBESITY  . CERUMEN IMPACTION, RIGHT  . HYPERTENSION  . ACUTE BRONCHITIS  . CELLULITIS  . BACK PAIN  . TENDINITIS  . INSOMNIA  . FATIGUE  . LEG EDEMA, BILATERAL  . NEVUS    End of Session Activity Tolerance: Patient tolerated treatment well General Behavior During Session: [facility] for tasks performed Cognition: [facility] for tasks performed    Aida Lemaire L. Malyssa Maris, COTA/L  05/02/2011, 1:54 PM

## 2011-05-03 ENCOUNTER — Ambulatory Visit (HOSPITAL_COMMUNITY)
Admission: RE | Admit: 2011-05-03 | Discharge: 2011-05-03 | Disposition: A | Discharge status: Home / Self Care | Payer: 59 | Source: Ambulatory Visit | Attending: Family Medicine | Admitting: Family Medicine

## 2011-05-03 NOTE — Progress Notes (Signed)
Occupational Therapy Treatment  Patient Details  Name: Christina Carroll MRN: [id number] Date of Birth: 10-17-1945  Today's Date: 05/03/2011 Time: 5621-3086 Manual Therapy [id number]  10' Therapeutic Exercise 1125-1200 35' Time Calculation (min): 45 min  Visit#: 13  of 24   Re-eval: 05/05/11    Subjective Symptoms/Limitations Symptoms: S: It doesn't hurt too bad today. Repetition: Decreases Symptoms Pain Assessment Currently in Pain?: No/denies  Precautions/Restrictions     Exercise/Treatments Supine Protraction: PROM;10 reps;AROM;15 reps Horizontal ABduction: PROM;AROM;10 reps;15 reps External Rotation: PROM;10 reps;AROM;15 reps Internal Rotation: PROM;10 reps;AROM;15 reps Flexion: PROM;10 reps;AROM;15 reps ABduction: PROM;10 reps;AROM;15 reps Seated Extension: Theraband;12 reps Theraband Level (Shoulder Extension): Level 2 (Red) Retraction: Theraband;12 reps Theraband Level (Shoulder Retraction): Level 2 (Red) Row: Theraband;10 reps Theraband Level (Shoulder Row): Level 2 (Red) Protraction: AROM;10 reps Theraband Level (Shoulder Protraction): Level 3 (Green) Horizontal ABduction: AROM;10 reps Theraband Level (Shoulder Horizontal ABduction): Level 3 (Green) External Rotation: AROM;10 reps Theraband Level (Shoulder External Rotation): Level 2 (Red) Internal Rotation: AROM;10 reps Theraband Level (Shoulder Internal Rotation): Level 2 (Red) Flexion: AROM;10 reps Theraband Level (Shoulder Flexion): Level 2 (Red) Abduction: AROM;10 reps Therapy Ball Flexion: 20 reps ABduction: 20 reps Right/Left: 5 reps ROM / Strengthening / Isometric Strengthening UBE (Upper Arm Bike): 3 and 3 2.0 Wall Wash: 4'   Occupational Therapy Assessment and Plan OT Assessment and Plan Clinical Impression Statement: A: Began to work stengthening more. 1 lb supine and seated. Cont strengthening OT Frequency: Min 3X/week OT Duration: 8 weeks OT Treatment/Interventions: Self-care/ADL  training;Therapeutic exercise;Therapeutic activities;Manual therapy;Patient/family education OT Plan: P: Reasses.   Goals Home Exercise Program Pt will Perform Home Exercise Program: Independently Short Term Goals Time to Complete Short Term Goals: 2 weeks Short Term Goal 1: Pain free AROM Of BUE down from 5/10 with movement. Short Term Goal 1 Progress: Met Short Term Goal 2: Increase Flexion and Abduction by 20 degrees Short Term Goal 2 Progress: Met Short Term Goal 3: Independence with HEP for light strengthening of rotator cuff and scapular stability. Short Term Goal 3 Progress: Met Short Term Goal 4: Patient will be able to use arms at chest height for ADL's without pain. Short Term Goal 4 Progress: Met Short Term Goal 5: Patient will be able to wash her hair without pain. Short Term Goal 5 Progress: Met Long Term Goals Time to Complete Long Term Goals: 4 weeks Long Term Goal 1: AROM to WNL for ability to reach for items above her head without pain. Long Term Goal 1 Progress: Progressing toward goal Long Term Goal 2: Pateint will state 1/10  to no pain on movement and use down from 5/10 with use. Long Term Goal 2 Progress: Met Long Term Goal 3: 5/5 BUE strength up from4+/5 for ability to lift boxes of cards without pain. 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 Long Term Goal 4 Progress: Progressing toward goal Long Term Goal 5: Patient will reduce facial restrictions to min from Moderate. Long Term Goal 5 Progress: Progressing toward goal  Problem List Patient Active Problem List  Diagnoses  . UNSPECIFIED DISORDER OF THYROID  . HYPERLIPIDEMIA  . OBESITY  . CERUMEN IMPACTION, RIGHT  . HYPERTENSION  . ACUTE BRONCHITIS  . CELLULITIS  . BACK PAIN  . TENDINITIS  . INSOMNIA  . FATIGUE  . LEG EDEMA, BILATERAL  . NEVUS    End of Session Activity Tolerance:  Patient tolerated treatment well General Behavior During Session: [facility] for tasks performed Cognition: [facility] for tasks performed   Lisa Roca OTR/L 05/03/2011, 3:01 PM

## 2011-05-05 ENCOUNTER — Ambulatory Visit (HOSPITAL_COMMUNITY)
Admission: RE | Admit: 2011-05-05 | Discharge: 2011-05-05 | Disposition: A | Discharge status: Home / Self Care | Payer: 59 | Source: Ambulatory Visit | Attending: Family Medicine | Admitting: Family Medicine

## 2011-05-08 ENCOUNTER — Ambulatory Visit (HOSPITAL_COMMUNITY)
Admission: RE | Admit: 2011-05-08 | Discharge: 2011-05-08 | Disposition: A | Discharge status: Home / Self Care | Payer: 59 | Source: Ambulatory Visit | Attending: Family Medicine | Admitting: Family Medicine

## 2011-05-08 NOTE — Progress Notes (Signed)
Occupational Therapy Treatment  Patient Details  Name: Christina Carroll MRN: [id number] Date of Birth: [birth year]  Today's Date: 05/08/2011 Time: 4098-1191 Time Calculation (min): 59 min  Visit#: 14  of 24   Re-eval: 05/05/11 Manual Therapy [id number] 16' Therapeutic Exercise [id number] 42'    Subjective Symptoms/Limitations Symptoms: S: I have been sore all weekend. Pain Assessment Currently in Pain?: No/denies  Precautions/Restrictions     Exercise/Treatments Supine Protraction: PROM;Strengthening;10 reps Protraction Limitations: 1# Horizontal ABduction: PROM;Strengthening;10 reps Horizontal ABduction Weight (lbs): 1# External Rotation: PROM;Strengthening;10 reps External Rotation Weight (lbs): 1# Internal Rotation: PROM;Strengthening;10 reps Internal Rotation Weight (lbs): 1# Flexion: PROM;Strengthening;10 reps Shoulder Flexion Weight (lbs): 1# ABduction: PROM;Strengthening;10 reps Shoulder ABduction Weight (lbs): 1# Seated Protraction: AROM;12 reps Horizontal ABduction: AROM;12 reps External Rotation: AROM;12 reps Internal Rotation: AROM;12 reps Flexion: AROM;12 reps Abduction: AROM;12 reps Therapy Ball Flexion: 20 reps ABduction: 20 reps Right/Left: 5 reps ROM / Strengthening / Isometric Strengthening UBE (Upper Arm Bike): 3 and 3 2.0 Wall Wash: 4' Thumb Tacks: 1'       Manual Therapy Manual Therapy: Myofascial release Myofascial Release: MFR and manual stretching to bilateral shoulders to decrease restrictions and pain to increase functional AROM.  Occupational Therapy Assessment and Plan OT Assessment and Plan Clinical Impression Statement: A:  Increased reps in supine. Rehab Potential: Excellent OT Plan: P: Reassess.   Goals Home Exercise Program Pt will Perform Home Exercise Program: Independently Short Term Goals Time to Complete Short Term Goals: 2 weeks Short Term Goal 1: Pain free AROM Of BUE down from 5/10 with movement. Short Term  Goal 2: Increase Flexion and Abduction by 20 degrees Short Term Goal 3: Independence with HEP for light strengthening of rotator cuff and scapular stability. Short Term Goal 4: Patient will be able to use arms at chest height for ADL's without pain. Short Term Goal 5: Patient will be able to wash her hair without pain. Long Term Goals Time to Complete Long Term Goals: 4 weeks Long Term Goal 1: AROM to WNL for ability to reach for items above her head without pain. Long Term Goal 2: Pateint will state 1/10  to no pain on movement and use down from 5/10 with use. Long Term Goal 3: 5/5 BUE strength up from4+/5 for ability to lift boxes of cards without pain. 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 Long Term Goal 5: Patient will reduce facial restrictions to min from Moderate.  Problem List Patient Active Problem List  Diagnoses  . UNSPECIFIED DISORDER OF THYROID  . HYPERLIPIDEMIA  . OBESITY  . CERUMEN IMPACTION, RIGHT  . HYPERTENSION  . ACUTE BRONCHITIS  . CELLULITIS  . BACK PAIN  . TENDINITIS  . INSOMNIA  . FATIGUE  . LEG EDEMA, BILATERAL  . NEVUS    End of Session Activity Tolerance: Patient tolerated treatment well General Behavior During Session: [facility] for tasks performed Cognition: [facility] for tasks performed   Myrle Dues L. Noralee Stain, COTA/L  05/08/2011, 12:24 PM

## 2011-05-10 ENCOUNTER — Ambulatory Visit (HOSPITAL_COMMUNITY)
Admission: RE | Admit: 2011-05-10 | Discharge: 2011-05-10 | Disposition: A | Discharge status: Home / Self Care | Payer: 59 | Source: Ambulatory Visit | Attending: Family Medicine | Admitting: Family Medicine

## 2011-05-10 NOTE — Progress Notes (Signed)
Occupational Therapy Treatment  Patient Details  Name: Christina Carroll MRN: [id number] Date of Birth: [birth year]  Today's Date: 05/10/2011 Time: 4098-1191 Time Calculation (min): 24 min  Visit#: 15  of 24   Re-eval: 05/10/11    Subjective Symptoms/Limitations Symptoms: S:  I feel ok, I have 800mg  ibuoprofen Pain Assessment Currently in Pain?: No/denies  Precautions/Restrictions     Exercise/Treatments Supine Protraction: PROM;10 reps;Strengthening;15 reps Protraction Limitations: 1# Horizontal ABduction: PROM;10 reps;Strengthening;15 reps Horizontal ABduction Weight (lbs): 1# External Rotation: PROM;10 reps;Strengthening;15 reps External Rotation Weight (lbs): 1# Internal Rotation: PROM;10 reps;Strengthening;15 reps Internal Rotation Weight (lbs): 1# Flexion: PROM;10 reps;Strengthening;15 reps Shoulder Flexion Weight (lbs): 1# ABduction: PROM;10 reps;Strengthening;15 reps Shoulder ABduction Weight (lbs): 1# ROM / Strengthening / Isometric Strengthening UBE (Upper Arm Bike): 3 and 3 2.0      Manual Therapy Manual Therapy: Myofascial release Myofascial Release: MFR and manual stretching to bilateral shoulders to decrease restrictions and pain to increase functional AROM.  Occupational Therapy Assessment and Plan OT Assessment and Plan Clinical Impression Statement: A:  See d/c note Rehab Potential: Excellent OT Plan: P:  D/C to HEP   Goals Home Exercise Program Pt will Perform Home Exercise Program: Independently Short Term Goals Time to Complete Short Term Goals: 2 weeks Short Term Goal 1: Pain free AROM Of BUE down from 5/10 with movement. Short Term Goal 2: Increase Flexion and Abduction by 20 degrees Short Term Goal 3: Independence with HEP for light strengthening of rotator cuff and scapular stability. Short Term Goal 4: Patient will be able to use arms at chest height for ADL's without pain. Short Term Goal 5: Patient will be able to wash her hair  without pain. Long Term Goals Time to Complete Long Term Goals: 4 weeks Long Term Goal 1: AROM to WNL for ability to reach for items above her head without pain. Long Term Goal 2: Pateint will state 1/10  to no pain on movement and use down from 5/10 with use. Long Term Goal 3: 5/5 BUE strength up from4+/5 for ability to lift boxes of cards without pain. 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 Long Term Goal 5: Patient will reduce facial restrictions to min from Moderate.  Problem List Patient Active Problem List  Diagnoses  . UNSPECIFIED DISORDER OF THYROID  . HYPERLIPIDEMIA  . OBESITY  . CERUMEN IMPACTION, RIGHT  . HYPERTENSION  . ACUTE BRONCHITIS  . CELLULITIS  . BACK PAIN  . TENDINITIS  . INSOMNIA  . FATIGUE  . LEG EDEMA, BILATERAL  . NEVUS    End of Session Activity Tolerance: Patient tolerated treatment well General Behavior During Session: [facility] for tasks performed Cognition: [facility] for tasks performed  GO No functional reporting required  Colandra Ohanian L. Noralee Stain, COTA/L  05/10/2011, 5:38 PM

## 2011-05-10 NOTE — Evaluation (Signed)
Occupational Therapy Evaluation  Patient Details  Name: Christina Carroll MRN: [id number] Date of Birth: [birth year]  Today's Date: 05/10/2011 Time: 4098-1191 Re-asses 10 min Time Calculation (min): 24 min  Visit#: 15  of 24   Re-eval: 05/10/11  Assessment Diagnosis:  (Bilateral Rotator Cuff Syndrome) Surgical Date:  (N/A) Next MD Visit:  (No Known Date) Prior Therapy:  (None)  Past Medical History:  Past Medical History  Diagnosis Date  . Obesity   . Thyroid disorder   . Hyperlipidemia   . Hypertension   . Arthritis    Past Surgical History:  Past Surgical History  Procedure Date  . Lumpectomy removed from thoracic spine area 1999  . Bilateral foot surgery bone removed 2000    Subjective Symptoms/Limitations Symptoms: S:  I feel ok, I have 800mg  ibuoprofen Pain Assessment Currently in Pain?: No/denies  Precautions/Restrictions None    Prior Functioning  Home Living Lives With: Alone Receives Help From:  (Does not need help) Type of Home: House Home Layout: One level;Full bath on main level;Able to live on main level with bedroom/bathroom Home Access: Stairs to enter Entrance Stairs-Rails: None Entrance Stairs-Number of Steps:  (2) Bathroom Shower/Tub: Engineer, manufacturing systems: Standard Bathroom Accessibility: Yes Home Adaptive Equipment: Bedside commode/3-in-1;Hand-held shower hose;Grab bars in shower Prior Function Level of Independence: Independent with basic ADLs;Independent with homemaking with ambulation Able to Take Stairs?: Yes Driving: Yes 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[employer] ; lift and carry)  Assessment ADL/Vision/Perception ADL Eating/Feeding: Independent Grooming: Independent Upper Body Bathing: Simulated;Independent Lower Body Bathing: Independent Upper Body Dressing: Independent Lower Body Dressing: Independent Toilet Transfer: Independent Toileting - Clothing Manipulation:  Independent Toileting - Hygiene: Independent Tub/Shower Transfer: Independent Vision - History Baseline Vision: Wears glasses for distance only Vision - Assessment Eye Alignment: Within Functional Limits Perception Perception: Within Functional Limits Praxis Praxis: Intact  Cognition/Observation Cognition Overall Cognitive Status: Appears within functional limits for tasks assessed Arousal/Alertness: Awake/alert Orientation Level: Oriented X4  Sensation/Coordination/Edema Sensation Light Touch: Appears Intact Stereognosis: Appears Intact Hot/Cold: Appears Intact Proprioception: Appears Intact Coordination Gross Motor Movements are Fluid and Coordinated: Yes Fine Motor Movements are Fluid and Coordinated: Yes Edema Edema:  (None noted)  Additional Assessments RUE Assessment RUE Assessment: Exceptions to [facility] RUE AROM (degrees) Right Shoulder Flexion  0-170: 150 Degrees Right Shoulder ABduction 0-140: 145 Degrees Right Shoulder Internal Rotation  0-70: 68 Degrees Right Shoulder External Rotation  0-90: 52 Degrees LUE Assessment LUE Assessment: Exceptions to WFL LUE AROM (degrees) Left Shoulder Flexion  0-170: 162 Degrees Left Shoulder ABduction 0-40: 160 Degrees Left Shoulder Internal Rotation  0-70: 60 Degrees Left Shoulder External Rotation  0-90: 65 Degrees Palpation Palpation:  (no restrictions noted. Patient has decreased tightness traps)     Manual Therapy Manual Therapy: Myofascial release Myofascial Release: MFR and manual stretching to bilateral shoulders to decrease restrictions and pain to increase functional AROM.  Occupational Therapy Assessment and Plan OT Assessment and Plan Clinical Impression Statement: A: Patient arrives today stating that she feels she is ready for discharge today. AROM now [facility], strength 5/5 inshoulders bilaterally, No pain todya and back to doing things for herself. Rehab Potential: Excellent OT Plan: P: Discharge today  patient met her goals.   Goals Home Exercise Program Pt will Perform Home Exercise Program: Independently Short Term Goals Time to Complete Short Term Goals: 2 weeks Short Term Goal 1: Pain free AROM Of BUE down from 5/10 with movement. Short Term Goal 1 Progress: Met Short  Term Goal 2: Increase Flexion and Abduction by 20 degrees Short Term Goal 2 Progress: Met Short Term Goal 3: Independence with HEP for light strengthening of rotator cuff and scapular stability. Short Term Goal 3 Progress: Met Short Term Goal 4: Patient will be able to use arms at chest height for ADL's without pain. Short Term Goal 4 Progress: Met Short Term Goal 5: Patient will be able to wash her hair without pain. Short Term Goal 5 Progress: Met Long Term Goals Time to Complete Long Term Goals: 4 weeks Long Term Goal 1: AROM to WNL for ability to reach for items above her head without pain. Long Term Goal 1 Progress: Met Long Term Goal 2: Pateint will state 1/10  to no pain on movement and use down from 5/10 with use. Long Term Goal 2 Progress: Met Long Term Goal 3: 5/5 BUE strength up from4+/5 for ability to lift boxes of cards without pain. 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 Long Term Goal 4 Progress: Met Long Term Goal 5: Patient will reduce facial restrictions to min from Moderate. Long Term Goal 5 Progress: Met  Problem List Patient Active Problem List  Diagnoses  . UNSPECIFIED DISORDER OF THYROID  . HYPERLIPIDEMIA  . OBESITY  . CERUMEN IMPACTION, RIGHT  . HYPERTENSION  . ACUTE BRONCHITIS  . CELLULITIS  . BACK PAIN  . TENDINITIS  . INSOMNIA  . FATIGUE  . LEG EDEMA, BILATERAL  . NEVUS    End of Session Activity Tolerance: Patient tolerated treatment well General Behavior During Session: [facility] for tasks performed Cognition: [facility] for tasks performed     Lisa Roca OTR/L 05/10/2011,  10:55 AM  Physician Documentation Your signature is required to indicate approval of the treatment plan as stated above.  Please sign and either send electronically or make a copy of this report for your files and return this physician signed original.  Please mark one 1.__approve of plan  2. ___approve of plan with the following conditions.   ______________________________                                                          _____________________ Physician Signature                                                                                                             Date

## 2011-05-10 NOTE — Evaluation (Signed)
Occupational Therapy Evaluation  Patient Details  Name: Christina Carroll MRN: [id number] Date of Birth: 1945/06/03  Today's Date: 05/10/2011 Time: 6440-3474 Re-asses 10 min Time Calculation (min): 24 min  Visit#: 15  of 24   Re-eval: 05/10/11  Assessment Diagnosis:  (Bilateral Rotator Cuff Syndrome) Surgical Date:  (N/A) Next MD Visit:  (No Known Date) Prior Therapy:  (None)  Past Medical History:  Past Medical History  Diagnosis Date  . Obesity   . Thyroid disorder   . Hyperlipidemia   . Hypertension   . Arthritis    Past Surgical History:  Past Surgical History  Procedure Date  . Lumpectomy removed from thoracic spine area 1999  . Bilateral foot surgery bone removed 2000    Subjective Symptoms/Limitations Symptoms: S:  I feel ok, I have 800mg  ibuoprofen Pain Assessment Currently in Pain?: No/denies  Precautions/Restrictions None    Prior Functioning  Home Living Lives With: Alone Receives Help From:  (Does not need help) Type of Home: House Home Layout: One level;Full bath on main level;Able to live on main level with bedroom/bathroom Home Access: Stairs to enter Entrance Stairs-Rails: None Entrance Stairs-Number of Steps:  (2) Bathroom Shower/Tub: Engineer, manufacturing systems: Standard Bathroom Accessibility: Yes Home Adaptive Equipment: Bedside commode/3-in-1;Hand-held shower hose;Grab bars in shower Prior Function Level of Independence: Independent with basic ADLs;Independent with homemaking with ambulation Able to Take Stairs?: Yes Driving: Yes 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[employer] ; lift and carry)  Assessment ADL/Vision/Perception ADL Eating/Feeding: Independent Grooming: Independent Upper Body Bathing: Simulated;Independent Lower Body Bathing: Independent Upper Body Dressing: Independent Lower Body Dressing: Independent Toilet Transfer: Independent Toileting - Clothing Manipulation:  Independent Toileting - Hygiene: Independent Tub/Shower Transfer: Independent Vision - History Baseline Vision: Wears glasses for distance only Vision - Assessment Eye Alignment: Within Functional Limits Perception Perception: Within Functional Limits Praxis Praxis: Intact  Cognition/Observation Cognition Overall Cognitive Status: Appears within functional limits for tasks assessed Arousal/Alertness: Awake/alert Orientation Level: Oriented X4  Sensation/Coordination/Edema Sensation Light Touch: Appears Intact Stereognosis: Appears Intact Hot/Cold: Appears Intact Proprioception: Appears Intact Coordination Gross Motor Movements are Fluid and Coordinated: Yes Fine Motor Movements are Fluid and Coordinated: Yes Edema Edema:  (None noted)  Additional Assessments RUE Assessment RUE Assessment: Exceptions to [facility] RUE AROM (degrees) Right Shoulder Flexion  0-170: 150 Degrees Right Shoulder ABduction 0-140: 145 Degrees Right Shoulder Internal Rotation  0-70: 68 Degrees Right Shoulder External Rotation  0-90: 52 Degrees LUE Assessment LUE Assessment: Exceptions to WFL LUE AROM (degrees) Left Shoulder Flexion  0-170: 162 Degrees Left Shoulder ABduction 0-40: 160 Degrees Left Shoulder Internal Rotation  0-70: 60 Degrees Left Shoulder External Rotation  0-90: 65 Degrees Palpation Palpation:  (no restrictions noted. Patient has decreased tightness traps)     Manual Therapy Manual Therapy: Myofascial release Myofascial Release: MFR and manual stretching to bilateral shoulders to decrease restrictions and pain to increase functional AROM.  Occupational Therapy Assessment and Plan OT Assessment and Plan Clinical Impression Statement: A: Patient arrives today stating that she feels she is ready for discharge today. AROM now [facility], strength 5/5 inshoulders bilaterally, No pain todya and back to doing things for herself. Rehab Potential: Excellent OT Plan: P: Discharge today  patient met her goals.   Goals Home Exercise Program Pt will Perform Home Exercise Program: Independently Short Term Goals Time to Complete Short Term Goals: 2 weeks Short Term Goal 1: Pain free AROM Of BUE down from 5/10 with movement. Short Term Goal 1 Progress: Met Short  Term Goal 2: Increase Flexion and Abduction by 20 degrees Short Term Goal 2 Progress: Met Short Term Goal 3: Independence with HEP for light strengthening of rotator cuff and scapular stability. Short Term Goal 3 Progress: Met Short Term Goal 4: Patient will be able to use arms at chest height for ADL's without pain. Short Term Goal 4 Progress: Met Short Term Goal 5: Patient will be able to wash her hair without pain. Short Term Goal 5 Progress: Met Long Term Goals Time to Complete Long Term Goals: 4 weeks Long Term Goal 1: AROM to WNL for ability to reach for items above her head without pain. Long Term Goal 1 Progress: Met Long Term Goal 2: Pateint will state 1/10  to no pain on movement and use down from 5/10 with use. Long Term Goal 2 Progress: Met Long Term Goal 3: 5/5 BUE strength up from4+/5 for ability to lift boxes of cards without pain. 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 Long Term Goal 4 Progress: Met Long Term Goal 5: Patient will reduce facial restrictions to min from Moderate. Long Term Goal 5 Progress: Met  Problem List Patient Active Problem List  Diagnoses  . UNSPECIFIED DISORDER OF THYROID  . HYPERLIPIDEMIA  . OBESITY  . CERUMEN IMPACTION, RIGHT  . HYPERTENSION  . ACUTE BRONCHITIS  . CELLULITIS  . BACK PAIN  . TENDINITIS  . INSOMNIA  . FATIGUE  . LEG EDEMA, BILATERAL  . NEVUS    End of Session Activity Tolerance: Patient tolerated treatment well General Behavior During Session: [facility] for tasks performed Cognition: [facility] for tasks performed     Lisa Roca OTR/L 05/10/2011,  10:30 AM  Physician Documentation Your signature is required to indicate approval of the treatment plan as stated above.  Please sign and either send electronically or make a copy of this report for your files and return this physician signed original.  Please mark one 1.__approve of plan  2. ___approve of plan with the following conditions.   ______________________________                                                          _____________________ Physician Signature                                                                                                             Date

## 2011-05-12 ENCOUNTER — Ambulatory Visit (HOSPITAL_COMMUNITY): Payer: 59 | Admitting: Occupational Therapy

## 2011-06-13 LAB — HEPATIC FUNCTION PANEL
ALT: 30 U/L (ref 0–35)
AST: 30 U/L (ref 0–37)
Albumin: 4.3 g/dL (ref 3.5–5.2)
Alkaline Phosphatase: 99 U/L (ref 39–117)
Total Bilirubin: 0.9 mg/dL (ref 0.3–1.2)
Total Protein: 6.5 g/dL (ref 6.0–8.3)

## 2011-06-14 NOTE — Telephone Encounter (Signed)
Addended by: Abner Greenspan on: 06/14/2011 01:06 PM   Modules accepted: Orders

## 2011-06-14 NOTE — Telephone Encounter (Signed)
Patient aware. Will mail lab order

## 2011-07-25 ENCOUNTER — Other Ambulatory Visit: Payer: Self-pay | Admitting: Family Medicine

## 2011-07-25 DIAGNOSIS — Z139 Encounter for screening, unspecified: Secondary | ICD-10-CM

## 2011-07-31 ENCOUNTER — Other Ambulatory Visit: Payer: Self-pay | Admitting: Family Medicine

## 2011-08-03 ENCOUNTER — Other Ambulatory Visit: Payer: Self-pay | Admitting: Family Medicine

## 2011-08-24 ENCOUNTER — Encounter: Payer: 59 | Admitting: Family Medicine

## 2011-08-31 ENCOUNTER — Ambulatory Visit (HOSPITAL_COMMUNITY)
Admission: RE | Admit: 2011-08-31 | Discharge: 2011-08-31 | Disposition: A | Discharge status: Home / Self Care | Payer: 59 | Source: Ambulatory Visit | Attending: Family Medicine | Admitting: Family Medicine

## 2011-08-31 DIAGNOSIS — Z1231 Encounter for screening mammogram for malignant neoplasm of breast: Secondary | ICD-10-CM | POA: Insufficient documentation

## 2011-08-31 DIAGNOSIS — Z139 Encounter for screening, unspecified: Secondary | ICD-10-CM

## 2011-09-13 LAB — HEPATIC FUNCTION PANEL
Albumin: 4.3 g/dL (ref 3.5–5.2)
Total Bilirubin: 1 mg/dL (ref 0.3–1.2)
Total Protein: 6.8 g/dL (ref 6.0–8.3)

## 2011-09-13 LAB — LIPID PANEL
LDL Cholesterol: 129 mg/dL — ABNORMAL HIGH (ref 0–99)
Triglycerides: 66 mg/dL (ref <150)
VLDL: 13 mg/dL (ref 0–40)

## 2011-09-19 ENCOUNTER — Other Ambulatory Visit (HOSPITAL_COMMUNITY)
Admission: RE | Admit: 2011-09-19 | Discharge: 2011-09-19 | Disposition: A | Discharge status: Home / Self Care | Payer: 59 | Source: Ambulatory Visit | Attending: Family Medicine | Admitting: Family Medicine

## 2011-09-19 ENCOUNTER — Telehealth: Payer: Self-pay | Admitting: Family Medicine

## 2011-09-19 ENCOUNTER — Ambulatory Visit (INDEPENDENT_AMBULATORY_CARE_PROVIDER_SITE_OTHER): Payer: 59 | Admitting: Family Medicine

## 2011-09-19 ENCOUNTER — Other Ambulatory Visit: Payer: Self-pay

## 2011-09-19 ENCOUNTER — Encounter: Payer: Self-pay | Admitting: Family Medicine

## 2011-09-19 VITALS — BP 130/82 | HR 83 | Resp 16 | Ht 64.0 in | Wt 190.0 lb

## 2011-09-19 DIAGNOSIS — Z0000 Encounter for general adult medical examination without abnormal findings: Secondary | ICD-10-CM

## 2011-09-19 DIAGNOSIS — Z124 Encounter for screening for malignant neoplasm of cervix: Secondary | ICD-10-CM

## 2011-09-19 DIAGNOSIS — Z1211 Encounter for screening for malignant neoplasm of colon: Secondary | ICD-10-CM

## 2011-09-19 DIAGNOSIS — R5381 Other malaise: Secondary | ICD-10-CM

## 2011-09-19 DIAGNOSIS — E785 Hyperlipidemia, unspecified: Secondary | ICD-10-CM

## 2011-09-19 DIAGNOSIS — I10 Essential (primary) hypertension: Secondary | ICD-10-CM

## 2011-09-19 DIAGNOSIS — G2581 Restless legs syndrome: Secondary | ICD-10-CM

## 2011-09-19 DIAGNOSIS — Z01419 Encounter for gynecological examination (general) (routine) without abnormal findings: Secondary | ICD-10-CM | POA: Insufficient documentation

## 2011-09-19 DIAGNOSIS — Z13820 Encounter for screening for osteoporosis: Secondary | ICD-10-CM

## 2011-09-19 DIAGNOSIS — E669 Obesity, unspecified: Secondary | ICD-10-CM

## 2011-09-19 LAB — POC HEMOCCULT BLD/STL (OFFICE/1-CARD/DIAGNOSTIC): Fecal Occult Blood, POC: NEGATIVE

## 2011-09-19 MED ORDER — PRAMIPEXOLE DIHYDROCHLORIDE 0.125 MG PO TABS: 0.1250 mg | ORAL_TABLET | Freq: Every day | ORAL | Status: DC

## 2011-09-19 NOTE — Assessment & Plan Note (Signed)
Pt counseled re need for lifestyle changes to improve health. The need to lower fat intake, increase cholesterol med and lose weight is also addressed. She also needs to commit to regular exercise which she is currently not doing

## 2011-09-19 NOTE — Progress Notes (Signed)
  Subjective:    Patient ID: Christina Carroll, female    DOB: July 12, 1945, 66 y.o.   MRN: [id number]  HPI The PT is here for annual exam and re-evaluation of chronic medical conditions, medication management and review of any available recent lab and radiology data.  Preventive health is updated, specifically  Cancer screening and Immunization.   Questions or concerns regarding consultations or procedures which the PT has had in the interim are  addressed. The PT denies any adverse reactions to current medications since the last visit.  C/o restless legs keeping her awake at night, and also a problem when she is sitting still eg at a play.  Concerned about excessive weight gain and intends to work on this to address the problem    Review of Systems See HPI Denies recent fever or chills. Denies sinus pressure, nasal congestion, ear pain or sore throat. Denies chest congestion, productive cough or wheezing. Denies chest pains, palpitations and leg swelling Denies abdominal pain, nausea, vomiting,diarrhea or constipation.   Denies dysuria, frequency, hesitancy or incontinence. Denies joint pain, swelling and limitation in mobility. Denies headaches, seizures, numbness, or tingling. Denies depression, anxiety or insomnia. Denies skin break down or rash.        Objective:   Physical Exam    Pleasant obese female, alert and oriented x 3, in no cardio-pulmonary distress. Afebrile. HEENT No facial trauma or asymetry. Sinuses non tender.  EOMI, PERTL, fundoscopic exam is normal, no hemorhage or exudate.  External ears normal, tympanic membranes clear. Oropharynx moist, no exudate, fair  dentition. Neck: supple, no adenopathy,JVD or thyromegaly.No bruits.  Chest: Clear to ascultation bilaterally.No crackles or wheezes. Non tender to palpation  Breast: No asymetry,no masses. No nipple discharge or inversion. No axillary or supraclavicular adenopathy  Cardiovascular  system; Heart sounds normal,  S1 and  S2 ,no S3.  No murmur, or thrill. Apical beat not displaced Peripheral pulses normal.  Abdomen: Soft, non tender, no organomegaly or masses. No bruits. Bowel sounds normal. No guarding, tenderness or rebound.  Rectal:  No mass. Guaiac negative stool.  GU: External genitalia normal. No lesions. Vaginal canal normal.No discharge. Uterus normal size, no adnexal masses, no cervical motion or adnexal tenderness.  Musculoskeletal exam: Full ROM of spine, hips , shoulders and knees. No deformity ,swelling or crepitus noted. No muscle wasting or atrophy.   Neurologic: Cranial nerves 2 to 12 intact. Power, tone ,sensation and reflexes normal throughout. No disturbance in gait. No tremor.  Skin: Intact, no ulceration, erythema , scaling or rash noted. Pigmentation normal throughout  Psych; Normal mood and affect. Judgement and concentration normal     Assessment & Plan:

## 2011-09-19 NOTE — Patient Instructions (Addendum)
F/u in 5.5 month  You are referred for screening colonoscopy and bone density test  Please start 1 multivitamin once daily  It is important that you exercise regularly at least 30 minutes 5 times a week. If you develop chest pain, have severe difficulty breathing, or feel very tired, stop exercising immediately and seek medical attention   A healthy diet is rich in fruit, vegetables and whole grains. Poultry fish, nuts and beans are a healthy choice for protein rather then red meat. A low sodium diet and drinking 64 ounces of water daily is generally recommended. Oils and sweet should be limited. Carbohydrates especially for those who are diabetic or overweight, should be limited to 30-45 gram per meal. It is important to eat on a regular schedule, at least 3 times daily. Snacks should be primarily fruits, vegetables or nuts.  Weight loss goal of 15 pounds in the next 5 month   New medication to be started for restless legs, call if you have problems.You are being started on the lowest dose  Blood pressure sl;ightly high today, need to change lifestyle.  Cholesterol increased increase med to 5 days weekly  Fasting lipid cmp and hBA1C  And CBc in 5.5 month, before next visit

## 2011-09-20 NOTE — Telephone Encounter (Signed)
Med sent to appropriate pharmacy.

## 2011-09-26 ENCOUNTER — Encounter (INDEPENDENT_AMBULATORY_CARE_PROVIDER_SITE_OTHER): Payer: Self-pay | Admitting: *Deleted

## 2011-09-27 ENCOUNTER — Ambulatory Visit (HOSPITAL_COMMUNITY)
Admission: RE | Admit: 2011-09-27 | Discharge: 2011-09-27 | Disposition: A | Discharge status: Home / Self Care | Payer: 59 | Source: Ambulatory Visit | Attending: Family Medicine | Admitting: Family Medicine

## 2011-09-27 DIAGNOSIS — Z13820 Encounter for screening for osteoporosis: Secondary | ICD-10-CM

## 2011-10-02 ENCOUNTER — Other Ambulatory Visit (HOSPITAL_COMMUNITY): Payer: 59

## 2011-10-04 ENCOUNTER — Other Ambulatory Visit (HOSPITAL_COMMUNITY): Payer: 59

## 2011-10-05 ENCOUNTER — Other Ambulatory Visit (INDEPENDENT_AMBULATORY_CARE_PROVIDER_SITE_OTHER): Payer: Self-pay | Admitting: *Deleted

## 2011-10-05 ENCOUNTER — Telehealth (INDEPENDENT_AMBULATORY_CARE_PROVIDER_SITE_OTHER): Payer: Self-pay | Admitting: *Deleted

## 2011-10-05 ENCOUNTER — Encounter (INDEPENDENT_AMBULATORY_CARE_PROVIDER_SITE_OTHER): Payer: Self-pay | Admitting: *Deleted

## 2011-10-05 ENCOUNTER — Inpatient Hospital Stay (HOSPITAL_COMMUNITY): Admission: RE | Admit: 2011-10-05 | Payer: 59 | Source: Ambulatory Visit

## 2011-10-05 DIAGNOSIS — Z1211 Encounter for screening for malignant neoplasm of colon: Secondary | ICD-10-CM

## 2011-10-05 NOTE — Telephone Encounter (Signed)
Patient needs movi prep 

## 2011-10-06 ENCOUNTER — Ambulatory Visit (HOSPITAL_COMMUNITY)
Admission: RE | Admit: 2011-10-06 | Discharge: 2011-10-06 | Disposition: A | Discharge status: Home / Self Care | Payer: 59 | Source: Ambulatory Visit | Attending: Family Medicine | Admitting: Family Medicine

## 2011-10-06 DIAGNOSIS — Z780 Asymptomatic menopausal state: Secondary | ICD-10-CM | POA: Insufficient documentation

## 2011-10-06 DIAGNOSIS — Z13820 Encounter for screening for osteoporosis: Secondary | ICD-10-CM | POA: Insufficient documentation

## 2011-10-06 MED ORDER — PEG-KCL-NACL-NASULF-NA ASC-C 100 G PO SOLR: 1.0000 | Freq: Once | ORAL | Status: DC

## 2011-10-16 ENCOUNTER — Other Ambulatory Visit (HOSPITAL_COMMUNITY): Payer: 59

## 2011-10-19 ENCOUNTER — Encounter: Payer: Self-pay | Admitting: Family Medicine

## 2011-11-14 ENCOUNTER — Telehealth: Payer: Self-pay | Admitting: Family Medicine

## 2011-11-14 ENCOUNTER — Encounter (HOSPITAL_COMMUNITY): Payer: Self-pay | Admitting: Pharmacy Technician

## 2011-11-15 ENCOUNTER — Telehealth: Payer: Self-pay | Admitting: Family Medicine

## 2011-11-15 ENCOUNTER — Telehealth (INDEPENDENT_AMBULATORY_CARE_PROVIDER_SITE_OTHER): Payer: Self-pay | Admitting: *Deleted

## 2011-11-15 NOTE — Telephone Encounter (Signed)
PCP/Requesting MD: simpson  Name & DOB: Christina Carroll 12-24-2045     Procedure: tcs  Reason/Indication:  screening  Has patient had this procedure before?  yes  If so, when, by whom and where?  2002  Is there a family history of colon cancer?  no  Who?  What age when diagnosed?    Is patient diabetic?   no      Does patient have prosthetic heart valve?  no  Do you have a pacemaker?  no  Has patient had joint replacement within last 12 months?  no  Is patient on Coumadin, Plavix and/or Aspirin? yes  Medications: asa 81 mg daily, nicardipine hcl 30 mg bid, simvastatin 40 mg daily, oyster shell w/ vit d tid, moexipril/hctz 15/25 mg daily  Allergies: knda  Medication Adjustment: asa 2 days  Procedure date & time: 11/30/11 at 830

## 2011-11-15 NOTE — Telephone Encounter (Signed)
Have tried several times to reach pt. If she calls back please ask what she needs

## 2011-11-15 NOTE — Telephone Encounter (Signed)
Called and left message for pt to return call.  

## 2011-11-16 ENCOUNTER — Telehealth: Payer: Self-pay

## 2011-11-16 ENCOUNTER — Other Ambulatory Visit: Payer: Self-pay

## 2011-11-16 MED ORDER — NICARDIPINE HCL 30 MG PO CAPS: 30.0000 mg | ORAL_CAPSULE | Freq: Two times a day (BID) | ORAL | Status: DC

## 2011-11-16 NOTE — Telephone Encounter (Signed)
Spoke with pt

## 2011-11-16 NOTE — Telephone Encounter (Signed)
Spoke with pt and requested medicine sent to [facility]

## 2011-11-16 NOTE — Telephone Encounter (Signed)
States her Cardene was refilled to [facility] and they told her they were unable to get it and she wants to know if it can be changed to something else. Something similar.

## 2011-11-17 NOTE — Telephone Encounter (Signed)
agree

## 2011-11-21 ENCOUNTER — Encounter: Payer: Self-pay | Admitting: Family Medicine

## 2011-11-21 ENCOUNTER — Other Ambulatory Visit: Payer: Self-pay | Admitting: Family Medicine

## 2011-11-21 ENCOUNTER — Ambulatory Visit (INDEPENDENT_AMBULATORY_CARE_PROVIDER_SITE_OTHER): Payer: 59 | Admitting: Family Medicine

## 2011-11-21 VITALS — BP 142/88 | HR 76 | Resp 18 | Ht 64.0 in | Wt 191.1 lb

## 2011-11-21 DIAGNOSIS — L0291 Cutaneous abscess, unspecified: Secondary | ICD-10-CM

## 2011-11-21 DIAGNOSIS — E669 Obesity, unspecified: Secondary | ICD-10-CM

## 2011-11-21 DIAGNOSIS — L0390 Cellulitis, unspecified: Secondary | ICD-10-CM

## 2011-11-21 DIAGNOSIS — I10 Essential (primary) hypertension: Secondary | ICD-10-CM

## 2011-11-21 DIAGNOSIS — E785 Hyperlipidemia, unspecified: Secondary | ICD-10-CM

## 2011-11-21 MED ORDER — SULFAMETHOXAZOLE-TRIMETHOPRIM 800-160 MG PO TABS: 1.0000 | ORAL_TABLET | Freq: Two times a day (BID) | ORAL | Status: AC

## 2011-11-21 MED ORDER — NIFEDIPINE ER OSMOTIC RELEASE 30 MG PO TB24: 30.0000 mg | ORAL_TABLET | Freq: Every day | ORAL | Status: DC

## 2011-11-21 NOTE — Patient Instructions (Addendum)
F/u in early to mid November  New medication for your blood pressure as discussed is already at the pharmacy.  Se[tra is prescribed twice daily for 1 week for skin infection on back of right leg.Call in 2 weeks if no better  It is important that you exercise regularly at least 30 minutes 5 times a week. If you develop chest pain, have severe difficulty breathing, or feel very tired, stop exercising immediately and seek medical attention  A healthy diet is rich in fruit, vegetables and whole grains. Poultry fish, nuts and beans are a healthy choice for protein rather then red meat. A low sodium diet and drinking 64 ounces of water daily is generally recommended. Oils and sweet should be limited. Carbohydrates especially for those who are diabetic or overweight, should be limited to 30-45 gram per meal. It is important to eat on a regular schedule, at least 3 times daily. Snacks should be primarily fruits, vegetables or nuts.

## 2011-11-21 NOTE — Progress Notes (Signed)
  Subjective:    Patient ID: Christina Carroll, female    DOB: January 19, 1946, 66 y.o.   MRN: [id number]  HPI Pt bitten on back of right leg 2 weeks ago, in the past 3 days developed pain, redness and itching near the area, no drainage, fever or chills. The PT denies any adverse reactions to current medications since the last visit.  There are no  Other concerns.      Review of Systems See HPI Denies recent fever or chills. Denies sinus pressure, nasal congestion, ear pain or sore throat. Denies chest congestion, productive cough or wheezing. Denies chest pains, palpitations and leg swelling Denies abdominal pain, nausea, vomiting,diarrhea or constipation.   Denies dysuria, frequency, hesitancy or incontinence. Denies joint pain, swelling and limitation in mobility. Denies headaches, seizures, numbness, or tingling. Denies depression, anxiety or insomnia.       Objective:   Physical Exam Patient alert and oriented and in no cardiopulmonary distress.  HEENT: No facial asymmetry, EOMI, no sinus tenderness,  oropharynx pink and moist.  Neck supple no adenopathy.  Chest: Clear to auscultation bilaterally.  CVS: S1, S2 no murmurs, no S3.  ABD: Soft non tender. Bowel sounds normal.  Ext: No edema  MS: Adequate ROM spine, shoulders, hips and knees.  Skin: erythematous rash on right leg, mildly warm, no purulent drainage  Psych: Good eye contact, normal affect. Memory intact not anxious or depressed appearing.  CNS: CN 2-12 intact, power, tone and sensation normal throughout.        Assessment & Plan:

## 2011-11-21 NOTE — Telephone Encounter (Signed)
Pt to start procardia in place of cardene, nOT amlodipine , conflict with simvastatn, she is aware and actually has an appt today

## 2011-11-29 MED ORDER — SODIUM CHLORIDE 0.45 % IV SOLN
Freq: Once | INTRAVENOUS | Status: AC
  Administered 2011-11-30: 08:00:00 via INTRAVENOUS

## 2011-11-30 ENCOUNTER — Ambulatory Visit (HOSPITAL_COMMUNITY)
Admission: RE | Admit: 2011-11-30 | Discharge: 2011-11-30 | Disposition: A | Discharge status: Home / Self Care | Payer: 59 | Source: Ambulatory Visit | Attending: Internal Medicine | Admitting: Internal Medicine

## 2011-11-30 ENCOUNTER — Encounter (HOSPITAL_COMMUNITY)
Admission: RE | Disposition: A | Discharge status: Home / Self Care | Payer: Self-pay | Source: Ambulatory Visit | Attending: Internal Medicine

## 2011-11-30 ENCOUNTER — Encounter (HOSPITAL_COMMUNITY): Payer: Self-pay | Admitting: *Deleted

## 2011-11-30 DIAGNOSIS — E785 Hyperlipidemia, unspecified: Secondary | ICD-10-CM | POA: Insufficient documentation

## 2011-11-30 DIAGNOSIS — Z1211 Encounter for screening for malignant neoplasm of colon: Secondary | ICD-10-CM

## 2011-11-30 DIAGNOSIS — K6389 Other specified diseases of intestine: Secondary | ICD-10-CM

## 2011-11-30 DIAGNOSIS — I10 Essential (primary) hypertension: Secondary | ICD-10-CM | POA: Insufficient documentation

## 2011-11-30 HISTORY — PX: COLONOSCOPY: SHX5424

## 2011-11-30 SURGERY — COLONOSCOPY
Anesthesia: Moderate Sedation

## 2011-11-30 MED ORDER — MEPERIDINE HCL 50 MG/ML IJ SOLN
INTRAMUSCULAR | Status: DC | PRN
  Administered 2011-11-30 (×2): 25 mg via INTRAVENOUS

## 2011-11-30 MED ORDER — MIDAZOLAM HCL 5 MG/5ML IJ SOLN
INTRAMUSCULAR | Status: DC | PRN
  Administered 2011-11-30 (×2): 2 mg via INTRAVENOUS

## 2011-11-30 MED ORDER — STERILE WATER FOR IRRIGATION IR SOLN
Status: DC | PRN
  Administered 2011-11-30: 08:00:00

## 2011-11-30 MED ORDER — MIDAZOLAM HCL 5 MG/5ML IJ SOLN
INTRAMUSCULAR | Status: AC
  Filled 2011-11-30: qty 10

## 2011-11-30 MED ORDER — MEPERIDINE HCL 50 MG/ML IJ SOLN
INTRAMUSCULAR | Status: AC
  Filled 2011-11-30: qty 1

## 2011-11-30 NOTE — Op Note (Signed)
COLONOSCOPY PROCEDURE REPORT  PATIENT:  Christina Carroll  MR#:  [id number] Birthdate:  [birth year], 66 y.o., female Endoscopist:  Dr. Malissa Hippo, MD Referred By:  Dr. Syliva Overman, MD Procedure Date: 11/30/2011  Procedure:   Colonoscopy  Indications:  Patient is 66 year old African female was undergoing average risk screening colonoscopy. Patient's last exam was 11 years ago.  Informed Consent:  The procedure and risks were reviewed with the patient and informed consent was obtained.  Medications:  Demerol 50 mg IV Versed 4 mg IV  Description of procedure:  After a digital rectal exam was performed, that colonoscope was advanced from the anus through the rectum and colon to the area of the cecum, ileocecal valve and appendiceal orifice. The cecum was deeply intubated. These structures were well-seen and photographed for the record. From the level of the cecum and ileocecal valve, the scope was slowly and cautiously withdrawn. The mucosal surfaces were carefully surveyed utilizing scope tip to flexion to facilitate fold flattening as needed. The scope was pulled down into the rectum where a thorough exam including retroflexion was performed.  Findings:   Prep fair to satisfactory. Normal mucosa throughout. Normal rectal mucosa. Thickened anoderm.  Therapeutic/Diagnostic Maneuvers Performed:  None  Complications:  None  Cecal Withdrawal Time:  9 minutes  Impression:  Examination performed to cecum. Thickened anoderm otherwise normal examination. Examination somewhat limited because of quality of prep.  Recommendations:  Standard instructions given. Next screening examination in 10 years.  Arsal Tappan U  11/30/2011 8:56 AM  CC: Dr. Syliva Overman, MD & Dr. Bonnetta Barry ref. provider found

## 2011-11-30 NOTE — H&P (Signed)
Christina Carroll is an 66 y.o. female.   Chief Complaint: Patient is here for colonoscopy. HPI: Patient is 66 year old African female who is in for screening colonoscopy. Patient's last exam was 15 years ago. She denies abdominal pain change in her bowel habits or rectal bleeding. She has very good appetite. Family history is negative for colorectal carcinoma.  Past Medical History  Diagnosis Date  . Obesity   . Thyroid disorder   . Hyperlipidemia   . Hypertension   . Arthritis     Past Surgical History  Procedure Date  . Lumpectomy removed from thoracic spine area 1999  . Bilateral foot surgery bone removed 2000    Family History  Problem Relation Age of Onset  . Stroke Mother   . Diabetes Mother   . Hypertension Mother   . Coronary artery disease Father   . Arthritis    . Cancer     Social History:  reports that she has never smoked. She does not have any smokeless tobacco history on file. She reports that she does not drink alcohol or use illicit drugs.  Allergies: No Known Allergies  Medications Prior to Admission  Medication Sig Dispense Refill  . calcium-vitamin D (OSCAL WITH D) 500-200 MG-UNIT per tablet Take 1 tablet by mouth 3 (three) times daily.      Marland Kitchen NIFEdipine (PROCARDIA-XL/ADALAT-CC/NIFEDICAL-XL) 30 MG 24 hr tablet Take 1 tablet (30 mg total) by mouth daily.  30 tablet  2  . peg 3350 powder (MOVIPREP) 100 G SOLR Take 1 kit (100 g total) by mouth once.  1 kit  0  . simvastatin (ZOCOR) 40 MG tablet Take 40 mg by mouth every evening.      Marland Kitchen aspirin (ASPIRIN LOW DOSE) 81 MG EC tablet Take 81 mg by mouth daily.       . moexipril-hydrochlorothiazide (UNIRETIC) 15-25 MG per tablet Take 1 tablet by mouth daily.      Marland Kitchen sulfamethoxazole-trimethoprim (SEPTRA DS) 800-160 MG per tablet Take 1 tablet by mouth 2 (two) times daily.  14 tablet  0    No results found for this or any previous visit (from the past 48 hour(s)). No results found.  ROS  Blood pressure  144/90, pulse 78, temperature 97.8 F (36.6 C), temperature source Oral, resp. rate 16, height 5\' 4"  (1.626 m), weight 191 lb (86.637 kg), SpO2 95.00%. Physical Exam  Constitutional: She appears well-developed and well-nourished.  HENT:  Mouth/Throat: Oropharynx is clear and moist.  Eyes: Conjunctivae normal are normal. No scleral icterus.  Neck: No thyromegaly present.  Cardiovascular: Normal rate, regular rhythm and normal heart sounds.   No murmur heard. Respiratory: Effort normal and breath sounds normal.  GI: Soft. She exhibits no distension and no mass. There is no tenderness.  Musculoskeletal: She exhibits no edema.  Lymphadenopathy:    She has no cervical adenopathy.  Neurological: She is alert.  Skin: Skin is warm and dry.     Assessment/Plan Average risk screening colonoscopy.  Rein Popov U 11/30/2011, 8:28 AM

## 2011-12-02 NOTE — Assessment & Plan Note (Signed)
Acute infection on leg following insect bite 3 weeks ago, antibiotic course prescribed

## 2011-12-02 NOTE — Assessment & Plan Note (Addendum)
UnControlled, no change in medication DASH diet and commitment to daily physical activity for a minimum of 30 minutes discussed and encouraged, as a part of hypertension management. The importance of attaining a healthy weight is also discussed.  

## 2011-12-02 NOTE — Assessment & Plan Note (Signed)
Uncontrolled, pt neeeds to be more diligent with both diet and medication. No change at this time

## 2011-12-02 NOTE — Assessment & Plan Note (Signed)
Deteriorated. Patient re-educated about  the importance of commitment to a  minimum of 150 minutes of exercise per week. The importance of healthy food choices with portion control discussed. Encouraged to start a food diary, count calories and to consider  joining a support group. Sample diet sheets offered. Goals set by the patient for the next several months.    

## 2011-12-05 ENCOUNTER — Encounter (HOSPITAL_COMMUNITY): Payer: Self-pay | Admitting: Internal Medicine

## 2011-12-05 ENCOUNTER — Telehealth: Payer: Self-pay

## 2011-12-06 NOTE — Telephone Encounter (Signed)
Need to check her blood pressure on current med before i can make a decision

## 2011-12-06 NOTE — Telephone Encounter (Signed)
Revised note please refill her medications as currently recorded, one daily of uniretic and nifedipine, x 2 . Pls allow for early fill on the nifedipine.  Let pt know she is to go back to one daily of each as prescribed, based on the review of her record.  She can have oV in 2 weeks with me  DISREGAD earlier message allowing her to double the dose please

## 2011-12-06 NOTE — Telephone Encounter (Signed)
OV then, I am assuming since she is ok with the two nifedipine daily, needs to continue this till she comes in, I will enter a 1 week supply of the equivalent higher dose of two nifedipine tabs daily, pls send after you spk with her

## 2011-12-06 NOTE — Telephone Encounter (Signed)
Called patient and left message for them to return call at the office   

## 2011-12-06 NOTE — Telephone Encounter (Signed)
She is on her way to [place] right now on a bus. She can come in Monday for BP to be checked but she is about to run out of her meds this weekend. Do you want her to come in for OV or NV then?

## 2011-12-11 ENCOUNTER — Other Ambulatory Visit: Payer: Self-pay | Admitting: Family Medicine

## 2011-12-11 ENCOUNTER — Telehealth: Payer: Self-pay

## 2011-12-11 MED ORDER — NIFEDIPINE ER OSMOTIC RELEASE 60 MG PO TB24: 60.0000 mg | ORAL_TABLET | Freq: Every day | ORAL | Status: DC

## 2011-12-11 NOTE — Telephone Encounter (Signed)
Script priinted for 60mg  tab one daily, she needs to start today and f/u in November

## 2011-12-11 NOTE — Telephone Encounter (Signed)
Pt has been accidentally taking 2 nifedipine 30mg  and she ran out early and was unable to get an early refill. Per Dr Lodema Hong, will increase the Nifedipine to 60 mg. Pt aware and will come back in 6 weeks for OV

## 2011-12-11 NOTE — Telephone Encounter (Signed)
Faxed to [facility] 

## 2011-12-13 NOTE — Telephone Encounter (Signed)
Dr Lodema Hong increased the nifedipine to 60mg  to equal what the pt had been taking and she is to come back for follow up in 6 weeks

## 2012-01-02 ENCOUNTER — Other Ambulatory Visit: Payer: Self-pay | Admitting: Family Medicine

## 2012-01-02 ENCOUNTER — Other Ambulatory Visit: Payer: Self-pay

## 2012-01-02 MED ORDER — OYSTER SHELL CALCIUM/D 500-200 MG-UNIT PO TABS: 1.0000 | ORAL_TABLET | Freq: Three times a day (TID) | ORAL | Status: DC

## 2012-01-30 ENCOUNTER — Ambulatory Visit: Payer: 59 | Admitting: Family Medicine

## 2012-01-31 ENCOUNTER — Ambulatory Visit (INDEPENDENT_AMBULATORY_CARE_PROVIDER_SITE_OTHER): Payer: 59

## 2012-01-31 DIAGNOSIS — Z23 Encounter for immunization: Secondary | ICD-10-CM

## 2012-02-14 ENCOUNTER — Telehealth: Payer: Self-pay

## 2012-02-14 DIAGNOSIS — E785 Hyperlipidemia, unspecified: Secondary | ICD-10-CM

## 2012-02-14 DIAGNOSIS — R5383 Other fatigue: Secondary | ICD-10-CM

## 2012-02-14 DIAGNOSIS — I10 Essential (primary) hypertension: Secondary | ICD-10-CM

## 2012-02-14 DIAGNOSIS — E669 Obesity, unspecified: Secondary | ICD-10-CM

## 2012-02-14 LAB — CBC WITH DIFFERENTIAL/PLATELET
Basophils Relative: 0 % (ref 0–1)
Eosinophils Absolute: 0.1 10*3/uL (ref 0.0–0.7)
Hemoglobin: 13.7 g/dL (ref 12.0–15.0)
MCH: 27.2 pg (ref 26.0–34.0)
MCHC: 33.8 g/dL (ref 30.0–36.0)
Monocytes Relative: 10 % (ref 3–12)
Neutro Abs: 2.7 10*3/uL (ref 1.7–7.7)
Neutrophils Relative %: 52 % (ref 43–77)
Platelets: 366 10*3/uL (ref 150–400)
RBC: 5.04 MIL/uL (ref 3.87–5.11)

## 2012-02-14 LAB — LIPID PANEL
Cholesterol: 190 mg/dL (ref 0–200)
HDL: 69 mg/dL (ref >39)
Total CHOL/HDL Ratio: 2.8 Ratio
VLDL: 12 mg/dL (ref 0–40)

## 2012-02-14 LAB — COMPREHENSIVE METABOLIC PANEL
ALT: 27 U/L (ref 0–35)
AST: 26 U/L (ref 0–37)
Alkaline Phosphatase: 92 U/L (ref 39–117)
BUN: 15 mg/dL (ref 6–23)
Calcium: 9.5 mg/dL (ref 8.4–10.5)
Creat: 0.91 mg/dL (ref 0.50–1.10)
Total Bilirubin: 0.8 mg/dL (ref 0.3–1.2)

## 2012-02-14 LAB — HEMOGLOBIN A1C
Hgb A1c MFr Bld: 6 % — ABNORMAL HIGH (ref <5.7)
Mean Plasma Glucose: 126 mg/dL — ABNORMAL HIGH (ref <117)

## 2012-02-14 NOTE — Telephone Encounter (Signed)
Had to reorder labs that had been cancelled in [facility]

## 2012-02-22 ENCOUNTER — Ambulatory Visit (INDEPENDENT_AMBULATORY_CARE_PROVIDER_SITE_OTHER): Payer: 59 | Admitting: Family Medicine

## 2012-02-22 ENCOUNTER — Encounter: Payer: Self-pay | Admitting: Family Medicine

## 2012-02-22 VITALS — BP 110/70 | HR 70 | Resp 15 | Ht 64.0 in | Wt 196.0 lb

## 2012-02-22 DIAGNOSIS — I10 Essential (primary) hypertension: Secondary | ICD-10-CM

## 2012-02-22 DIAGNOSIS — R7303 Prediabetes: Secondary | ICD-10-CM

## 2012-02-22 DIAGNOSIS — E669 Obesity, unspecified: Secondary | ICD-10-CM

## 2012-02-22 DIAGNOSIS — E785 Hyperlipidemia, unspecified: Secondary | ICD-10-CM

## 2012-02-22 DIAGNOSIS — R7309 Other abnormal glucose: Secondary | ICD-10-CM

## 2012-02-22 MED ORDER — PRAVASTATIN SODIUM 40 MG PO TABS: 40.0000 mg | ORAL_TABLET | Freq: Every day | ORAL | Status: DC

## 2012-02-22 MED ORDER — OYSTER SHELL CALCIUM/D 500-200 MG-UNIT PO TABS: ORAL_TABLET | ORAL | Status: DC

## 2012-02-22 NOTE — Patient Instructions (Addendum)
F/u in 4 month  Blood pressure is excellent.  You are prediabetic, we will provide and explain info on carb counting and give you info to call and attend a class on how to eat to improve blood sugar.  You need to commit to 30 minutes exercise every day and to weight loss.  Cholesterol med is changed from simvastatin to pravasatin when you next fill you will get the script  Cholesterol has improved but is still too high , please reduce fried and fatty foods and take med every evening  You will get info that tells ou how to log in on the computer and follow labs and radiology reports, please use this  HBA1C, fasting lipid, cmp and EGFR in 4 month

## 2012-02-22 NOTE — Progress Notes (Signed)
  Subjective:    Patient ID: Christina Carroll, female    DOB: Aug 01, 1945, 66 y.o.   MRN: [id number]  HPI The PT is here for follow up and re-evaluation of chronic medical conditions, medication management and review of any available recent lab and radiology data.  Preventive health is updated, specifically  Cancer screening and Immunization.   Questions or concerns regarding consultations or procedures which the PT has had in the interim are  addressed. The PT denies any adverse reactions to current medications since the last visit.  There are no new concerns.  There are no specific complaints       Review of Systems See HPI Denies recent fever or chills. Denies sinus pressure, nasal congestion, ear pain or sore throat. Denies chest congestion, productive cough or wheezing. Denies chest pains, palpitations and leg swelling Denies abdominal pain, nausea, vomiting,diarrhea or constipation.   Denies dysuria, frequency, hesitancy or incontinence. Denies joint pain, swelling and limitation in mobility. Denies headaches, seizures, numbness, or tingling. Denies depression, anxiety or insomnia. Denies skin break down or rash.        Objective:   Physical Exam Patient alert and oriented and in no cardiopulmonary distress.  HEENT: No facial asymmetry, EOMI, no sinus tenderness,  oropharynx pink and moist.  Neck supple no adenopathy.  Chest: Clear to auscultation bilaterally.  CVS: S1, S2 no murmurs, no S3.  ABD: Soft non tender. Bowel sounds normal.  Ext: No edema  MS: Adequate ROM spine, shoulders, hips and knees.  Skin: Intact, no ulcerations or rash noted.  Psych: Good eye contact, normal affect. Memory intact not anxious or depressed appearing.  CNS: CN 2-12 intact, power, tone and sensation normal throughout.        Assessment & Plan:

## 2012-02-25 NOTE — Assessment & Plan Note (Signed)
Controlled, no change in medication DASH diet and commitment to daily physical activity for a minimum of 30 minutes discussed and encouraged, as a part of hypertension management. The importance of attaining a healthy weight is also discussed.  

## 2012-02-25 NOTE — Assessment & Plan Note (Signed)
New diagnosis. Please advise the patient that their blood sugars are higher than normal.   A normal HbA1C is less than 5.7, please give them their value.  They need to cut back on carb's and sweets, start regular exercise, target at least 30 minutes 5 days a weekly and aim for weight loss.  This will reduce the risk of becoming diabetic or delayed development.

## 2012-02-25 NOTE — Assessment & Plan Note (Signed)
Deteriorated. Patient re-educated about  the importance of commitment to a  minimum of 150 minutes of exercise per week. The importance of healthy food choices with portion control discussed. Encouraged to start a food diary, count calories and to consider  joining a support group. Sample diet sheets offered. Goals set by the patient for the next several months.    

## 2012-02-25 NOTE — Assessment & Plan Note (Signed)
Hyperlipidemia:Low fat diet discussed and encouraged.  LDL elevated, need to work on diet. No med change at this time

## 2012-02-27 ENCOUNTER — Encounter (HOSPITAL_COMMUNITY): Payer: Self-pay | Admitting: Dietician

## 2012-02-27 NOTE — Progress Notes (Signed)
[facility] Diabetes Class Completion  Date:February 27, 2012  Time: 10:00 AM  Pt attended [facility]'s Diabetes [facility] Education Class on February 27, 2012.   Patient was educated on the following topics: survival skills (signs and symptoms of hyperglycemia and hypoglycemia, treatment for hypoglycemia, ideal levels for fasting and postprandial blood sugars, goal Hgb A1c level, foot care basics), recommendations for physical activity, carbohydrate metabolism in relation to diabetes, and meal planning (sources of carbohydrate, carbohydrate counting, meal planning strategies, food label reading, and portion control).   Abrar Bilton A. Kayan, RD, LDN  

## 2012-04-04 ENCOUNTER — Ambulatory Visit: Payer: 59 | Admitting: Family Medicine

## 2012-04-22 ENCOUNTER — Other Ambulatory Visit: Payer: Self-pay | Admitting: Family Medicine

## 2012-04-29 ENCOUNTER — Ambulatory Visit: Payer: 59 | Admitting: Family Medicine

## 2012-05-13 ENCOUNTER — Telehealth: Payer: Self-pay | Admitting: Family Medicine

## 2012-05-30 NOTE — Telephone Encounter (Signed)
Patient in and asked for a statement of why she had prednisone in 2009-2012 and how many times in order to qualify for insurance?

## 2012-05-30 NOTE — Telephone Encounter (Signed)
Will call pharmacy to see when she has received prednisone in that time period.

## 2012-06-03 NOTE — Telephone Encounter (Signed)
List of when patient received prednisone available in physician's box.

## 2012-06-04 NOTE — Telephone Encounter (Signed)
Letter wtritten, pls type, I will sign, have copy for chart and put to be mailed to pt per her request

## 2012-06-04 NOTE — Telephone Encounter (Signed)
Letter typed and patient to retrieve letter on 3/19

## 2012-06-21 LAB — COMPLETE METABOLIC PANEL WITH GFR
ALT: 23 U/L (ref 0–35)
AST: 23 U/L (ref 0–37)
BUN: 15 mg/dL (ref 6–23)
Calcium: 9.9 mg/dL (ref 8.4–10.5)
Chloride: 103 mEq/L (ref 96–112)
Creat: 0.88 mg/dL (ref 0.50–1.10)
Total Bilirubin: 1 mg/dL (ref 0.3–1.2)

## 2012-06-21 LAB — LIPID PANEL
HDL: 62 mg/dL (ref >39)
LDL Cholesterol: 129 mg/dL — ABNORMAL HIGH (ref 0–99)
Total CHOL/HDL Ratio: 3.4 Ratio
VLDL: 18 mg/dL (ref 0–40)

## 2012-06-21 LAB — HEMOGLOBIN A1C
Hgb A1c MFr Bld: 5.9 % — ABNORMAL HIGH (ref <5.7)
Mean Plasma Glucose: 123 mg/dL — ABNORMAL HIGH (ref <117)

## 2012-06-27 ENCOUNTER — Encounter: Payer: Self-pay | Admitting: Family Medicine

## 2012-06-27 ENCOUNTER — Ambulatory Visit (INDEPENDENT_AMBULATORY_CARE_PROVIDER_SITE_OTHER): Payer: 59 | Admitting: Family Medicine

## 2012-06-27 VITALS — BP 128/90 | HR 83 | Resp 16 | Ht 63.0 in | Wt 185.0 lb

## 2012-06-27 DIAGNOSIS — R5381 Other malaise: Secondary | ICD-10-CM

## 2012-06-27 DIAGNOSIS — E669 Obesity, unspecified: Secondary | ICD-10-CM

## 2012-06-27 DIAGNOSIS — R7303 Prediabetes: Secondary | ICD-10-CM

## 2012-06-27 DIAGNOSIS — I10 Essential (primary) hypertension: Secondary | ICD-10-CM

## 2012-06-27 DIAGNOSIS — E079 Disorder of thyroid, unspecified: Secondary | ICD-10-CM

## 2012-06-27 DIAGNOSIS — R7309 Other abnormal glucose: Secondary | ICD-10-CM

## 2012-06-27 DIAGNOSIS — E785 Hyperlipidemia, unspecified: Secondary | ICD-10-CM

## 2012-06-27 NOTE — Assessment & Plan Note (Signed)
Uncontrolled, no med change DASH diet and commitment to daily physical activity for a minimum of 30 minutes discussed and encouraged, as a part of hypertension management. The importance of attaining a healthy weight is also discussed.  

## 2012-06-27 NOTE — Assessment & Plan Note (Signed)
Followed by endo.  

## 2012-06-27 NOTE — Assessment & Plan Note (Signed)
Improved, pt encouraged to work on exercise and weight loss to further improve.

## 2012-06-27 NOTE — Assessment & Plan Note (Signed)
Improved. Pt applauded on succesful weight loss through lifestyle change, and encouraged to continue same. Weight loss goal set for the next several months.  

## 2012-06-27 NOTE — Progress Notes (Signed)
  Subjective:    Patient ID: Christina Carroll, female    DOB: 10/29/1945, 67 y.o.   MRN: [id number]  HPI The PT is here for follow up and re-evaluation of chronic medical conditions, medication management and review of any available recent lab and radiology data.  Preventive health is updated, specifically  Cancer screening and Immunization.   Questions or concerns regarding consultations or procedures which the PT has had in the interim are  addressed. The PT denies any adverse reactions to current medications since the last visit.  There are no new concerns.  There are no specific complaints       Review of Systems See HPI Denies recent fever or chills. Denies sinus pressure, nasal congestion, ear pain or sore throat. Denies chest congestion, productive cough or wheezing. Denies chest pains, palpitations and leg swelling Denies abdominal pain, nausea, vomiting,diarrhea or constipation.   Denies dysuria, frequency, hesitancy or incontinence. Denies joint pain, swelling and limitation in mobility. Denies headaches, seizures, numbness, or tingling. Denies depression, anxiety or insomnia. Denies skin break down or rash.        Objective:   Physical Exam Patient alert and oriented and in no cardiopulmonary distress.  HEENT: No facial asymmetry, EOMI, no sinus tenderness,  oropharynx pink and moist.  Neck supple no adenopathy.  Chest: Clear to auscultation bilaterally.  CVS: S1, S2 no murmurs, no S3.  ABD: Soft non tender. Bowel sounds normal.  Ext: No edema  MS: Adequate ROM spine, shoulders, hips and knees.  Skin: Intact, no ulcerations or rash noted.  Psych: Good eye contact, normal affect. Memory intact not anxious or depressed appearing.  CNS: CN 2-12 intact, power, tone and sensation normal throughout.        Assessment & Plan:

## 2012-06-27 NOTE — Patient Instructions (Addendum)
Pelvic and breast end September  It is important that you exercise regularly at least 30 minutes 5 times a week. If you develop chest pain, have severe difficulty breathing, or feel very tired, stop exercising immediately and seek medical attention   A healthy diet is rich in fruit, vegetables and whole grains. Poultry fish, nuts and beans are a healthy choice for protein rather then red meat. A low sodium diet and drinking 64 ounces of water daily is generally recommended. Oils and sweet should be limited. Carbohydrates especially for those who are diabetic or overweight, should be limited to 30-45 gram per meal. It is important to eat on a regular schedule, at least 3 times daily. Snacks should be primarily fruits, vegetables or nuts.   Congrats on weight loss, please keep it up  Cholesterol and blood pressure are too high, cut  Back on sodium and lose weight    Fasting lipid, cmp, and HBa1C end September

## 2012-07-08 ENCOUNTER — Ambulatory Visit (INDEPENDENT_AMBULATORY_CARE_PROVIDER_SITE_OTHER): Payer: 59 | Admitting: Family Medicine

## 2012-07-08 ENCOUNTER — Encounter: Payer: Self-pay | Admitting: Family Medicine

## 2012-07-08 VITALS — BP 128/76 | HR 70 | Temp 98.6°F | Resp 18 | Ht 63.0 in | Wt 186.1 lb

## 2012-07-08 DIAGNOSIS — J069 Acute upper respiratory infection, unspecified: Secondary | ICD-10-CM

## 2012-07-08 DIAGNOSIS — J302 Other seasonal allergic rhinitis: Secondary | ICD-10-CM

## 2012-07-08 DIAGNOSIS — J309 Allergic rhinitis, unspecified: Secondary | ICD-10-CM

## 2012-07-08 MED ORDER — FLUTICASONE PROPIONATE 50 MCG/ACT NA SUSP: 2.0000 | Freq: Every day | NASAL | Status: DC | PRN

## 2012-07-08 MED ORDER — AZITHROMYCIN 250 MG PO TABS: ORAL_TABLET | ORAL | Status: AC

## 2012-07-08 NOTE — Patient Instructions (Addendum)
Take the antibiotics as prescribed Use the flonase Use the zyrtec as prescribed Robitussin for cough F/U Dr.Simpson as previous

## 2012-07-10 DIAGNOSIS — J302 Other seasonal allergic rhinitis: Secondary | ICD-10-CM | POA: Insufficient documentation

## 2012-07-10 DIAGNOSIS — J069 Acute upper respiratory infection, unspecified: Secondary | ICD-10-CM | POA: Insufficient documentation

## 2012-07-10 NOTE — Assessment & Plan Note (Signed)
flonase and antihistamine 

## 2012-07-10 NOTE — Assessment & Plan Note (Signed)
Will cover with Zpak due to worsening symptoms, cough medication

## 2012-07-10 NOTE — Progress Notes (Signed)
  Subjective:    Patient ID: Christina Carroll, female    DOB: 19-Feb-1946, 67 y.o.   MRN: [id number]  HPI  Sinus pressure, cough with clear congestion, achy feeling for past week. Tried zyrtec did not help as much.  No recent fever, but is not improving, cough worsening Started with more allergy symptoms, sneezing, runny nose, watery eyes  Review of Systems   GEN- + fatigue, fever, weight loss,weakness, recent illness HEENT- denies eye drainage, change in vision,+ nasal discharge, CVS- denies chest pain, palpitations RESP- denies SOB, +cough, wheeze ABD- denies N/V, change in stools, abd pain Neuro- denies headache, dizziness, syncope, seizure activity      Objective:   Physical Exam   GEN- NAD, alert and oriented x3, hoarse voice HEENT- PERRL, EOMI, non injected sclera, pink conjunctiva, MMM, oropharynx mild injection, TM clear bilat no effusion, no maxillary sinus tenderness, inflammed turbinates, + Nasal drainage  Neck- Supple, shotty  LAD CVS- RRR, no murmur RESP-upper airway congestion EXT- No edema Pulses- Radial 2+                Assessment & Plan:

## 2012-08-06 ENCOUNTER — Other Ambulatory Visit: Payer: Self-pay

## 2012-08-06 ENCOUNTER — Telehealth: Payer: Self-pay | Admitting: Family Medicine

## 2012-08-06 MED ORDER — NIFEDIPINE ER OSMOTIC RELEASE 60 MG PO TB24: 60.0000 mg | ORAL_TABLET | Freq: Every day | ORAL | Status: DC

## 2012-08-06 MED ORDER — MOEXIPRIL-HYDROCHLOROTHIAZIDE 15-25 MG PO TABS: 1.0000 | ORAL_TABLET | Freq: Every day | ORAL | Status: DC

## 2012-08-06 NOTE — Telephone Encounter (Signed)
Sent to [facility]  

## 2012-08-09 ENCOUNTER — Other Ambulatory Visit: Payer: Self-pay

## 2012-08-09 ENCOUNTER — Other Ambulatory Visit: Payer: Self-pay | Admitting: Family Medicine

## 2012-08-09 DIAGNOSIS — E785 Hyperlipidemia, unspecified: Secondary | ICD-10-CM

## 2012-08-09 DIAGNOSIS — Z139 Encounter for screening, unspecified: Secondary | ICD-10-CM

## 2012-08-09 MED ORDER — PRAVASTATIN SODIUM 40 MG PO TABS: 40.0000 mg | ORAL_TABLET | Freq: Every day | ORAL | Status: DC

## 2012-08-09 MED ORDER — MOEXIPRIL-HYDROCHLOROTHIAZIDE 15-25 MG PO TABS: 1.0000 | ORAL_TABLET | Freq: Every day | ORAL | Status: DC

## 2012-08-09 MED ORDER — NIFEDIPINE ER OSMOTIC RELEASE 60 MG PO TB24: 60.0000 mg | ORAL_TABLET | Freq: Every day | ORAL | Status: DC

## 2012-09-02 ENCOUNTER — Ambulatory Visit (HOSPITAL_COMMUNITY)
Admission: RE | Admit: 2012-09-02 | Discharge: 2012-09-02 | Disposition: A | Discharge status: Home / Self Care | Payer: 59 | Source: Ambulatory Visit | Attending: Family Medicine | Admitting: Family Medicine

## 2012-09-02 ENCOUNTER — Encounter: Payer: Self-pay | Admitting: Family Medicine

## 2012-09-02 ENCOUNTER — Ambulatory Visit (INDEPENDENT_AMBULATORY_CARE_PROVIDER_SITE_OTHER): Payer: 59 | Admitting: Family Medicine

## 2012-09-02 VITALS — BP 130/80 | HR 72 | Resp 16 | Ht 63.0 in | Wt 186.0 lb

## 2012-09-02 DIAGNOSIS — Z1231 Encounter for screening mammogram for malignant neoplasm of breast: Secondary | ICD-10-CM | POA: Insufficient documentation

## 2012-09-02 DIAGNOSIS — Z139 Encounter for screening, unspecified: Secondary | ICD-10-CM

## 2012-09-02 DIAGNOSIS — I10 Essential (primary) hypertension: Secondary | ICD-10-CM

## 2012-09-02 DIAGNOSIS — R519 Headache, unspecified: Secondary | ICD-10-CM | POA: Insufficient documentation

## 2012-09-02 DIAGNOSIS — E669 Obesity, unspecified: Secondary | ICD-10-CM

## 2012-09-02 DIAGNOSIS — R51 Headache: Secondary | ICD-10-CM

## 2012-09-02 MED ORDER — PREDNISONE 5 MG PO TABS: 5.0000 mg | ORAL_TABLET | Freq: Two times a day (BID) | ORAL | Status: AC

## 2012-09-02 NOTE — Progress Notes (Signed)
  Subjective:    Patient ID: Christina Carroll, female    DOB: 04-06-1945, 67 y.o.   MRN: [id number]  HPI  1 week ago pt started experiencing right temporal discomfort, rated at a 5 lasted all 5 days, discomfort/pressure to right eye. Felt as though it would run, it did not.No vision disturbance , weakness or numbness. No medication taken. Relief over the   weekend but has started again Duration a couple minutes at  A time Denies motor weakness, difficulty with speech or numbness. Has an aunt who had abdominal aneurysm, she is concerned, and "has may people who are concerned about her" Denies sinus pressure, stiff neck, no recent fever , chills or viral type illness Still has been unable to contact her endo office re thyroid management , will try some moe, no interest in changing to local mD  Review of Systems See HPI Denies recent fever or chills. Denies sinus pressure, nasal congestion, ear pain or sore throat. Denies chest congestion, productive cough or wheezing. Denies chest pains, palpitations and leg swelling Denies abdominal pain, nausea, vomiting,diarrhea or constipation.    Denies joint pain, swelling and limitation in mobility. Denies , seizures, numbness, or tingling.        Objective:   Physical Exam  Patient alert and oriented and in no cardiopulmonary distress.Currently in no pain  HEENT: No facial asymmetry, EOMI, no sinus tenderness,  oropharynx pink and moist.  Neck supple no adenopathy.Fundoscopy: no hemorhage or exudate, disc margins clear No tenderness over temporal arteries  Chest: Clear to auscultation bilaterally.  CVS: S1, S2 no murmurs, no S3.  ABD: Soft non tender. Bowel sounds normal.  Ext: No edema  MS: Adequate ROM spine, shoulders, hips and knees.  Skin: Intact, no ulcerations or rash noted.  Psych: Good eye contact, normal affect. Memory intact not anxious or depressed appearing.  CNS: CN 2-12 intact, power, tone and sensation  normal throughout.       Assessment & Plan:

## 2012-09-02 NOTE — Assessment & Plan Note (Signed)
Controlled, no change in medication  

## 2012-09-02 NOTE — Assessment & Plan Note (Signed)
Unchanged. Patient re-educated about  the importance of commitment to a  minimum of 150 minutes of exercise per week. The importance of healthy food choices with portion control discussed. Encouraged to start a food diary, count calories and to consider  joining a support group. Sample diet sheets offered. Goals set by the patient for the next several months.    

## 2012-09-02 NOTE — Assessment & Plan Note (Signed)
New persistent unilateral headache with  Right eye pressure. Brain scan , pt advised to take tylenol and prednisone for a short course. Working dx is cluster headache

## 2012-09-02 NOTE — Patient Instructions (Addendum)
F/U as before  Please take tylenol 325 mg one twice daily for 2 days, hopefully this will abort the headache  Exam and history suggest cluster headache, prednisone for 5 days will help with the tylenol and you will get the script for this also  You are referred for a brain scan  We will check to see if Dr Kerrie Pleasure is still in [place] and let you know

## 2012-09-09 ENCOUNTER — Ambulatory Visit (HOSPITAL_COMMUNITY)
Admission: RE | Admit: 2012-09-09 | Discharge: 2012-09-09 | Disposition: A | Discharge status: Home / Self Care | Payer: 59 | Source: Ambulatory Visit | Attending: Family Medicine | Admitting: Family Medicine

## 2012-09-09 ENCOUNTER — Encounter (HOSPITAL_COMMUNITY): Payer: Self-pay

## 2012-09-09 DIAGNOSIS — R51 Headache: Secondary | ICD-10-CM

## 2012-09-09 DIAGNOSIS — Z8489 Family history of other specified conditions: Secondary | ICD-10-CM | POA: Insufficient documentation

## 2012-09-13 ENCOUNTER — Telehealth: Payer: Self-pay

## 2012-09-13 NOTE — Telephone Encounter (Signed)
Wants her brain scan results asap

## 2012-09-16 ENCOUNTER — Telehealth: Payer: Self-pay | Admitting: Family Medicine

## 2012-09-16 ENCOUNTER — Other Ambulatory Visit: Payer: Self-pay | Admitting: Family Medicine

## 2012-09-16 DIAGNOSIS — J338 Other polyp of sinus: Secondary | ICD-10-CM

## 2012-09-16 DIAGNOSIS — R51 Headache: Secondary | ICD-10-CM

## 2012-09-16 NOTE — Telephone Encounter (Signed)
Pt states she decided to see ENT first

## 2012-09-16 NOTE — Telephone Encounter (Signed)
Spoke with pt directly re brain scan and she is referred to neurology and ENT

## 2012-09-16 NOTE — Telephone Encounter (Signed)
Noted, pls cancel neurology appt per pt stated request

## 2012-09-16 NOTE — Telephone Encounter (Signed)
Noted  

## 2012-09-16 NOTE — Telephone Encounter (Signed)
Discussed results with pt no obvious abnormality to ex-plain new headaches which have recurred since OV, also describing eye complaints travels from right side of face to left eye so that it feels as though eye was running, States she has had 4 headaches desrcibed as 1 to 2 mins of sharp ain so has not taken steroids were presrcibed. 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  She will be contacted by referral staff in approx 20 mins when she has checked her schedule so that appts can be set up based on her availability.  i have spoken directly with referral staff also

## 2012-09-19 ENCOUNTER — Ambulatory Visit (INDEPENDENT_AMBULATORY_CARE_PROVIDER_SITE_OTHER): Payer: Medicare Other | Admitting: Otolaryngology

## 2012-09-19 DIAGNOSIS — J330 Polyp of nasal cavity: Secondary | ICD-10-CM

## 2012-09-19 DIAGNOSIS — J310 Chronic rhinitis: Secondary | ICD-10-CM | POA: Diagnosis not present

## 2012-09-19 DIAGNOSIS — R51 Headache: Secondary | ICD-10-CM

## 2012-10-07 ENCOUNTER — Other Ambulatory Visit: Payer: Self-pay | Admitting: Family Medicine

## 2012-12-16 ENCOUNTER — Ambulatory Visit: Payer: 59 | Admitting: Family Medicine

## 2012-12-19 ENCOUNTER — Other Ambulatory Visit: Payer: Self-pay

## 2012-12-19 DIAGNOSIS — E785 Hyperlipidemia, unspecified: Secondary | ICD-10-CM

## 2012-12-19 MED ORDER — PRAVASTATIN SODIUM 40 MG PO TABS: 40.0000 mg | ORAL_TABLET | Freq: Every day | ORAL | Status: DC

## 2013-01-07 DIAGNOSIS — Z23 Encounter for immunization: Secondary | ICD-10-CM | POA: Diagnosis not present

## 2013-01-23 ENCOUNTER — Other Ambulatory Visit: Payer: Self-pay

## 2013-02-17 ENCOUNTER — Other Ambulatory Visit: Payer: Self-pay

## 2013-02-17 MED ORDER — NIFEDIPINE ER OSMOTIC RELEASE 60 MG PO TB24: 60.0000 mg | ORAL_TABLET | Freq: Every day | ORAL | Status: DC

## 2013-03-10 ENCOUNTER — Other Ambulatory Visit: Payer: Self-pay | Admitting: Family Medicine

## 2013-03-12 ENCOUNTER — Other Ambulatory Visit: Payer: Self-pay

## 2013-03-12 MED ORDER — OYSTER SHELL CALCIUM/D 500-200 MG-UNIT PO TABS: ORAL_TABLET | ORAL | Status: DC

## 2013-03-26 ENCOUNTER — Telehealth: Payer: Self-pay | Admitting: Family Medicine

## 2013-03-26 DIAGNOSIS — R5383 Other fatigue: Secondary | ICD-10-CM

## 2013-03-26 DIAGNOSIS — R5381 Other malaise: Secondary | ICD-10-CM

## 2013-03-26 DIAGNOSIS — E785 Hyperlipidemia, unspecified: Secondary | ICD-10-CM

## 2013-03-26 DIAGNOSIS — I10 Essential (primary) hypertension: Secondary | ICD-10-CM

## 2013-03-26 NOTE — Telephone Encounter (Signed)
What labs does patient need ordered?

## 2013-03-26 NOTE — Telephone Encounter (Signed)
Labs ordered and faxed.

## 2013-03-26 NOTE — Telephone Encounter (Signed)
Pls order CBC, fasting lipid and cmp

## 2013-03-26 NOTE — Addendum Note (Signed)
Addended by: Eual Fines on: 03/26/2013 10:01 AM   Modules accepted: Orders

## 2013-03-28 DIAGNOSIS — R5381 Other malaise: Secondary | ICD-10-CM | POA: Diagnosis not present

## 2013-03-28 DIAGNOSIS — I10 Essential (primary) hypertension: Secondary | ICD-10-CM | POA: Diagnosis not present

## 2013-03-28 DIAGNOSIS — E785 Hyperlipidemia, unspecified: Secondary | ICD-10-CM | POA: Diagnosis not present

## 2013-03-28 LAB — LIPID PANEL
CHOL/HDL RATIO: 3 ratio
CHOLESTEROL: 204 mg/dL — AB (ref 0–200)
HDL: 67 mg/dL (ref >39)
LDL Cholesterol: 124 mg/dL — ABNORMAL HIGH (ref 0–99)
Triglycerides: 64 mg/dL (ref <150)
VLDL: 13 mg/dL (ref 0–40)

## 2013-03-28 LAB — CBC WITH DIFFERENTIAL/PLATELET
BASOS PCT: 0 % (ref 0–1)
Basophils Absolute: 0 10*3/uL (ref 0.0–0.1)
EOS ABS: 0.1 10*3/uL (ref 0.0–0.7)
Eosinophils Relative: 3 % (ref 0–5)
HCT: 38.7 % (ref 36.0–46.0)
Hemoglobin: 12.9 g/dL (ref 12.0–15.0)
LYMPHS ABS: 1.9 10*3/uL (ref 0.7–4.0)
Lymphocytes Relative: 41 % (ref 12–46)
MCH: 27.2 pg (ref 26.0–34.0)
MCHC: 33.3 g/dL (ref 30.0–36.0)
MCV: 81.5 fL (ref 78.0–100.0)
Monocytes Absolute: 0.6 10*3/uL (ref 0.1–1.0)
Monocytes Relative: 13 % — ABNORMAL HIGH (ref 3–12)
NEUTROS PCT: 43 % (ref 43–77)
Neutro Abs: 2 10*3/uL (ref 1.7–7.7)
PLATELETS: 336 10*3/uL (ref 150–400)
RBC: 4.75 MIL/uL (ref 3.87–5.11)
RDW: 15 % (ref 11.5–15.5)
WBC: 4.7 10*3/uL (ref 4.0–10.5)

## 2013-03-28 LAB — COMPREHENSIVE METABOLIC PANEL
ALT: 22 U/L (ref 0–35)
AST: 22 U/L (ref 0–37)
Albumin: 4.2 g/dL (ref 3.5–5.2)
Alkaline Phosphatase: 89 U/L (ref 39–117)
BUN: 17 mg/dL (ref 6–23)
CALCIUM: 10 mg/dL (ref 8.4–10.5)
CO2: 30 meq/L (ref 19–32)
Chloride: 101 mEq/L (ref 96–112)
Creat: 0.83 mg/dL (ref 0.50–1.10)
GLUCOSE: 85 mg/dL (ref 70–99)
POTASSIUM: 4.6 meq/L (ref 3.5–5.3)
Sodium: 136 mEq/L (ref 135–145)
Total Bilirubin: 0.9 mg/dL (ref 0.3–1.2)
Total Protein: 6.9 g/dL (ref 6.0–8.3)

## 2013-04-03 ENCOUNTER — Encounter: Payer: Self-pay | Admitting: Family Medicine

## 2013-04-03 ENCOUNTER — Other Ambulatory Visit (HOSPITAL_COMMUNITY)
Admission: RE | Admit: 2013-04-03 | Discharge: 2013-04-03 | Disposition: A | Discharge status: Home / Self Care | Payer: Medicare Other | Source: Ambulatory Visit | Attending: Family Medicine | Admitting: Family Medicine

## 2013-04-03 ENCOUNTER — Ambulatory Visit (INDEPENDENT_AMBULATORY_CARE_PROVIDER_SITE_OTHER): Payer: Medicare Other | Admitting: Family Medicine

## 2013-04-03 VITALS — BP 140/84 | HR 80 | Resp 16 | Ht 63.0 in | Wt 187.0 lb

## 2013-04-03 DIAGNOSIS — Z01419 Encounter for gynecological examination (general) (routine) without abnormal findings: Secondary | ICD-10-CM

## 2013-04-03 DIAGNOSIS — Z23 Encounter for immunization: Secondary | ICD-10-CM

## 2013-04-03 DIAGNOSIS — E785 Hyperlipidemia, unspecified: Secondary | ICD-10-CM | POA: Diagnosis not present

## 2013-04-03 DIAGNOSIS — Z124 Encounter for screening for malignant neoplasm of cervix: Secondary | ICD-10-CM

## 2013-04-03 DIAGNOSIS — E079 Disorder of thyroid, unspecified: Secondary | ICD-10-CM

## 2013-04-03 DIAGNOSIS — M25572 Pain in left ankle and joints of left foot: Secondary | ICD-10-CM

## 2013-04-03 DIAGNOSIS — I10 Essential (primary) hypertension: Secondary | ICD-10-CM

## 2013-04-03 DIAGNOSIS — Z1211 Encounter for screening for malignant neoplasm of colon: Secondary | ICD-10-CM | POA: Diagnosis not present

## 2013-04-03 DIAGNOSIS — Z0000 Encounter for general adult medical examination without abnormal findings: Secondary | ICD-10-CM

## 2013-04-03 DIAGNOSIS — M25579 Pain in unspecified ankle and joints of unspecified foot: Secondary | ICD-10-CM

## 2013-04-03 DIAGNOSIS — R7303 Prediabetes: Secondary | ICD-10-CM

## 2013-04-03 DIAGNOSIS — R7309 Other abnormal glucose: Secondary | ICD-10-CM

## 2013-04-03 LAB — HEMOCCULT GUIAC POC 1CARD (OFFICE): Fecal Occult Blood, POC: NEGATIVE

## 2013-04-03 MED ORDER — PRAVASTATIN SODIUM 80 MG PO TABS: 80.0000 mg | ORAL_TABLET | Freq: Every day | ORAL | Status: DC

## 2013-04-03 NOTE — Patient Instructions (Addendum)
F/u in 5 month and 3 weeks  Pneumonia vaccine today  Fasting lipid, cmp and HBA1C before next visit  INCREASE dose of pravachol to 80mg  daily, take TWO 40mg  tablets together till done  Cholesterol is still too high, so please reduce fried and fatty foods and cheese also  It is important that you exercise regularly at least 30 minutes 5 times a week. If you develop chest pain, have severe difficulty breathing, or feel very tired, stop exercising immediately and seek medical attention    A healthy diet is rich in fruit, vegetables and whole grains. Poultry fish, nuts and beans are a healthy choice for protein rather then red meat. A low sodium diet and drinking 64 ounces of water daily is generally recommended. Oils and sweet should be limited. Carbohydrates especially for those who are diabetic or overweight, should be limited to 60-45 gram per meal. It is important to eat on a regular schedule, at least 3 times daily. Snacks should be primarily fruits, vegetables or nuts.  Weight loss goal of 1 to 1.5 pounds per month  You are referred to Dr Carmela Rima, and also to Dr Mardelle Matte in [facility] on new daughter and I wish the couple all  The best!

## 2013-04-06 NOTE — Assessment & Plan Note (Signed)
Exam as documented. Pt will be more consistent in healthy lifestyle and has definite weight loss goals set

## 2013-04-06 NOTE — Progress Notes (Signed)
   Subjective:    Patient ID: Christina Carroll, female    DOB: 1946/02/12, 68 y.o.   MRN: [id number]  HPI Pt in for annual pelvic and breast exam Generally well, she is aware of the need to commit to daily healthy habits as far as diet and exercise are concerned. She has weight loss goal set which is manageable with consistency. Needs to re establish with her endocrinologist and a referral is being made. C/o left ankle pain and deformity , which is not new, but wants this checked, will refer for ortho eval , no recent trauma    Review of Systems See HPI Denies recent fever or chills. Denies sinus pressure, nasal congestion, ear pain or sore throat. Denies chest congestion, productive cough or wheezing. Denies chest pains, palpitations and leg swelling Denies abdominal pain, nausea, vomiting,diarrhea or constipation.   Denies dysuria, frequency, hesitancy or incontinence.  Denies headaches, seizures, numbness, or tingling. Denies depression, anxiety or insomnia. Denies skin break down or rash.        Objective:   Physical Exam  Pleasant well nourished female, alert and oriented x 3, in no cardio-pulmonary distress. Afebrile. HEENT No facial trauma or asymetry. Sinuses non tender.  EOMI, PERTL, fundoscopic exam is normal, no hemorhage or exudate.  External ears normal, tympanic membranes clear. Oropharynx moist, no exudate,fairly  good dentition. Neck: supple, no adenopathy,JVD or thyromegaly.No bruits.  Chest: Clear to ascultation bilaterally.No crackles or wheezes. Non tender to palpation  Breast: No asymetry,no masses. No nipple discharge or inversion. No axillary or supraclavicular adenopathy  Cardiovascular system; Heart sounds normal,  S1 and  S2 ,no S3.  No murmur, or thrill. Apical beat not displaced Peripheral pulses normal.  Abdomen: Soft, non tender, no organomegaly or masses. No bruits. Bowel sounds normal. No guarding, tenderness or  rebound.  Rectal:  No mass. Guaiac negative stool.  GU: External genitalia normal. No lesions. Vaginal canal normal.Physiologic  discharge. Uterus normal size, no adnexal masses, no cervical motion or adnexal tenderness.  Musculoskeletal exam: Full ROM of spine, hips , shoulders and knees. No deformity ,swelling or crepitus noted. No muscle wasting or atrophy.   Neurologic: Cranial nerves 2 to 12 intact. Power, tone ,sensation and reflexes normal throughout. No disturbance in gait. No tremor.  Skin: Intact, no ulceration, erythema , scaling or rash noted. Pigmentation normal throughout  Psych; Normal mood and affect. Judgement and concentration normal      Assessment & Plan:

## 2013-04-08 DIAGNOSIS — M216X9 Other acquired deformities of unspecified foot: Secondary | ICD-10-CM | POA: Diagnosis not present

## 2013-04-08 DIAGNOSIS — M19079 Primary osteoarthritis, unspecified ankle and foot: Secondary | ICD-10-CM | POA: Diagnosis not present

## 2013-04-21 DIAGNOSIS — E049 Nontoxic goiter, unspecified: Secondary | ICD-10-CM | POA: Diagnosis not present

## 2013-04-21 DIAGNOSIS — E0590 Thyrotoxicosis, unspecified without thyrotoxic crisis or storm: Secondary | ICD-10-CM | POA: Diagnosis not present

## 2013-04-21 DIAGNOSIS — E0591 Thyrotoxicosis, unspecified with thyrotoxic crisis or storm: Secondary | ICD-10-CM | POA: Diagnosis not present

## 2013-04-21 DIAGNOSIS — H0520 Unspecified exophthalmos: Secondary | ICD-10-CM | POA: Diagnosis not present

## 2013-04-30 DIAGNOSIS — E049 Nontoxic goiter, unspecified: Secondary | ICD-10-CM | POA: Diagnosis not present

## 2013-04-30 DIAGNOSIS — E785 Hyperlipidemia, unspecified: Secondary | ICD-10-CM | POA: Diagnosis not present

## 2013-04-30 DIAGNOSIS — H0520 Unspecified exophthalmos: Secondary | ICD-10-CM | POA: Diagnosis not present

## 2013-04-30 DIAGNOSIS — E0591 Thyrotoxicosis, unspecified with thyrotoxic crisis or storm: Secondary | ICD-10-CM | POA: Diagnosis not present

## 2013-04-30 DIAGNOSIS — E0590 Thyrotoxicosis, unspecified without thyrotoxic crisis or storm: Secondary | ICD-10-CM | POA: Diagnosis not present

## 2013-04-30 DIAGNOSIS — I10 Essential (primary) hypertension: Secondary | ICD-10-CM | POA: Diagnosis not present

## 2013-05-14 DIAGNOSIS — E785 Hyperlipidemia, unspecified: Secondary | ICD-10-CM | POA: Diagnosis not present

## 2013-05-14 DIAGNOSIS — E063 Autoimmune thyroiditis: Secondary | ICD-10-CM | POA: Diagnosis not present

## 2013-05-14 DIAGNOSIS — E0590 Thyrotoxicosis, unspecified without thyrotoxic crisis or storm: Secondary | ICD-10-CM | POA: Diagnosis not present

## 2013-05-14 DIAGNOSIS — E049 Nontoxic goiter, unspecified: Secondary | ICD-10-CM | POA: Diagnosis not present

## 2013-05-14 DIAGNOSIS — E0591 Thyrotoxicosis, unspecified with thyrotoxic crisis or storm: Secondary | ICD-10-CM | POA: Diagnosis not present

## 2013-05-14 DIAGNOSIS — I10 Essential (primary) hypertension: Secondary | ICD-10-CM | POA: Diagnosis not present

## 2013-05-14 DIAGNOSIS — H0520 Unspecified exophthalmos: Secondary | ICD-10-CM | POA: Diagnosis not present

## 2013-05-16 ENCOUNTER — Other Ambulatory Visit: Payer: Self-pay

## 2013-05-16 MED ORDER — NIFEDIPINE ER OSMOTIC RELEASE 60 MG PO TB24: 60.0000 mg | ORAL_TABLET | Freq: Every day | ORAL | Status: DC

## 2013-05-22 DIAGNOSIS — I10 Essential (primary) hypertension: Secondary | ICD-10-CM | POA: Diagnosis not present

## 2013-05-22 DIAGNOSIS — E0590 Thyrotoxicosis, unspecified without thyrotoxic crisis or storm: Secondary | ICD-10-CM | POA: Diagnosis not present

## 2013-05-22 DIAGNOSIS — H0520 Unspecified exophthalmos: Secondary | ICD-10-CM | POA: Diagnosis not present

## 2013-05-22 DIAGNOSIS — E0591 Thyrotoxicosis, unspecified with thyrotoxic crisis or storm: Secondary | ICD-10-CM | POA: Diagnosis not present

## 2013-05-22 DIAGNOSIS — E785 Hyperlipidemia, unspecified: Secondary | ICD-10-CM | POA: Diagnosis not present

## 2013-05-22 DIAGNOSIS — E049 Nontoxic goiter, unspecified: Secondary | ICD-10-CM | POA: Diagnosis not present

## 2013-08-19 ENCOUNTER — Other Ambulatory Visit: Payer: Self-pay | Admitting: Family Medicine

## 2013-08-19 DIAGNOSIS — Z1231 Encounter for screening mammogram for malignant neoplasm of breast: Secondary | ICD-10-CM

## 2013-09-04 ENCOUNTER — Ambulatory Visit (HOSPITAL_COMMUNITY)
Admission: RE | Admit: 2013-09-04 | Discharge: 2013-09-04 | Disposition: A | Discharge status: Home / Self Care | Payer: Medicare Other | Source: Ambulatory Visit | Attending: Family Medicine | Admitting: Family Medicine

## 2013-09-04 DIAGNOSIS — Z1231 Encounter for screening mammogram for malignant neoplasm of breast: Secondary | ICD-10-CM | POA: Insufficient documentation

## 2013-09-15 DIAGNOSIS — I10 Essential (primary) hypertension: Secondary | ICD-10-CM | POA: Diagnosis not present

## 2013-09-15 DIAGNOSIS — R7309 Other abnormal glucose: Secondary | ICD-10-CM | POA: Diagnosis not present

## 2013-09-15 DIAGNOSIS — E785 Hyperlipidemia, unspecified: Secondary | ICD-10-CM | POA: Diagnosis not present

## 2013-09-15 LAB — HEMOGLOBIN A1C
Hgb A1c MFr Bld: 5.9 % — ABNORMAL HIGH (ref <5.7)
MEAN PLASMA GLUCOSE: 123 mg/dL — AB (ref <117)

## 2013-09-16 LAB — COMPREHENSIVE METABOLIC PANEL
ALT: 19 U/L (ref 0–35)
AST: 19 U/L (ref 0–37)
Albumin: 4.4 g/dL (ref 3.5–5.2)
Alkaline Phosphatase: 89 U/L (ref 39–117)
BILIRUBIN TOTAL: 1 mg/dL (ref 0.2–1.2)
BUN: 17 mg/dL (ref 6–23)
CALCIUM: 9.8 mg/dL (ref 8.4–10.5)
CO2: 28 meq/L (ref 19–32)
Chloride: 103 mEq/L (ref 96–112)
Creat: 1.04 mg/dL (ref 0.50–1.10)
GLUCOSE: 93 mg/dL (ref 70–99)
Potassium: 4.6 mEq/L (ref 3.5–5.3)
Sodium: 139 mEq/L (ref 135–145)
Total Protein: 6.8 g/dL (ref 6.0–8.3)

## 2013-09-16 LAB — LIPID PANEL
CHOLESTEROL: 197 mg/dL (ref 0–200)
HDL: 69 mg/dL (ref >39)
LDL Cholesterol: 113 mg/dL — ABNORMAL HIGH (ref 0–99)
Total CHOL/HDL Ratio: 2.9 Ratio
Triglycerides: 74 mg/dL (ref <150)
VLDL: 15 mg/dL (ref 0–40)

## 2013-09-22 ENCOUNTER — Ambulatory Visit (INDEPENDENT_AMBULATORY_CARE_PROVIDER_SITE_OTHER): Payer: 59 | Admitting: Family Medicine

## 2013-09-22 ENCOUNTER — Encounter: Payer: Self-pay | Admitting: Family Medicine

## 2013-09-22 VITALS — BP 130/82 | HR 76 | Resp 18 | Ht 63.0 in | Wt 192.0 lb

## 2013-09-22 DIAGNOSIS — D239 Other benign neoplasm of skin, unspecified: Secondary | ICD-10-CM | POA: Diagnosis not present

## 2013-09-22 DIAGNOSIS — I10 Essential (primary) hypertension: Secondary | ICD-10-CM

## 2013-09-22 DIAGNOSIS — N649 Disorder of breast, unspecified: Secondary | ICD-10-CM | POA: Diagnosis not present

## 2013-09-22 DIAGNOSIS — E785 Hyperlipidemia, unspecified: Secondary | ICD-10-CM

## 2013-09-22 DIAGNOSIS — E8881 Metabolic syndrome: Secondary | ICD-10-CM

## 2013-09-22 DIAGNOSIS — L988 Other specified disorders of the skin and subcutaneous tissue: Secondary | ICD-10-CM

## 2013-09-22 DIAGNOSIS — M899 Disorder of bone, unspecified: Secondary | ICD-10-CM

## 2013-09-22 DIAGNOSIS — R7303 Prediabetes: Secondary | ICD-10-CM

## 2013-09-22 DIAGNOSIS — M949 Disorder of cartilage, unspecified: Secondary | ICD-10-CM

## 2013-09-22 DIAGNOSIS — J309 Allergic rhinitis, unspecified: Secondary | ICD-10-CM

## 2013-09-22 DIAGNOSIS — J302 Other seasonal allergic rhinitis: Secondary | ICD-10-CM

## 2013-09-22 DIAGNOSIS — Z23 Encounter for immunization: Secondary | ICD-10-CM

## 2013-09-22 DIAGNOSIS — R7309 Other abnormal glucose: Secondary | ICD-10-CM | POA: Diagnosis not present

## 2013-09-22 DIAGNOSIS — E669 Obesity, unspecified: Secondary | ICD-10-CM

## 2013-09-22 DIAGNOSIS — D229 Melanocytic nevi, unspecified: Secondary | ICD-10-CM | POA: Insufficient documentation

## 2013-09-22 DIAGNOSIS — H43819 Vitreous degeneration, unspecified eye: Secondary | ICD-10-CM | POA: Diagnosis not present

## 2013-09-22 DIAGNOSIS — E079 Disorder of thyroid, unspecified: Secondary | ICD-10-CM

## 2013-09-22 MED ORDER — PRAVASTATIN SODIUM 80 MG PO TABS: 80.0000 mg | ORAL_TABLET | Freq: Every day | ORAL | Status: DC

## 2013-09-22 NOTE — Assessment & Plan Note (Signed)
Deteriorated. Patient re-educated about  the importance of commitment to a  minimum of 150 minutes of exercise per week. The importance of healthy food choices with portion control discussed. Encouraged to start a food diary, count calories and to consider  joining a support group. Sample diet sheets offered. Goals set by the patient for the next several months.    

## 2013-09-22 NOTE — Assessment & Plan Note (Signed)
Unchanged Patient educated about the importance of limiting  Carbohydrate intake , the need to commit to daily physical activity for a minimum of 30 minutes , and to commit weight loss. The fact that changes in all these areas will reduce or eliminate all together the development of diabetes is stressed.    

## 2013-09-22 NOTE — Assessment & Plan Note (Signed)
Uncontrolled, inc dose as before to pravachol 80

## 2013-09-22 NOTE — Patient Instructions (Addendum)
Welcome to medicare visit in November, call if you need me before  Please INCREASE pravachol to CORRECT dose of 80mg  daily, and reduce fat in diet  pls start walkin regularly, or doing some form of physical exercuise  Pls reduce flour and carb intake to improve your blood sugar  BP is good  Please sched appt for your eye exam  You are referred to breast surgeon re recurrent left breast mole  Fasting labs prior to f/u   Call in Sept for flu vaccine

## 2013-09-22 NOTE — Assessment & Plan Note (Signed)
Recurrent left breast mole, following recent cautery, will refer to breast surgeon

## 2013-09-22 NOTE — Assessment & Plan Note (Signed)
Followed by endo, had recent biopsy, which was benign, f/u in 1 year

## 2013-09-22 NOTE — Assessment & Plan Note (Signed)
Controlled, no change in medication DASH diet and commitment to daily physical activity for a minimum of 30 minutes discussed and encouraged, as a part of hypertension management. The importance of attaining a healthy weight is also discussed.  

## 2013-09-22 NOTE — Assessment & Plan Note (Signed)
Controlled, no change in medication  

## 2013-09-23 ENCOUNTER — Other Ambulatory Visit: Payer: Self-pay

## 2013-09-23 MED ORDER — NIFEDIPINE ER OSMOTIC RELEASE 60 MG PO TB24: 60.0000 mg | ORAL_TABLET | Freq: Every day | ORAL | Status: DC

## 2013-09-23 MED ORDER — MOEXIPRIL-HYDROCHLOROTHIAZIDE 15-25 MG PO TABS: 1.0000 | ORAL_TABLET | Freq: Every day | ORAL | Status: DC

## 2013-10-06 ENCOUNTER — Ambulatory Visit (INDEPENDENT_AMBULATORY_CARE_PROVIDER_SITE_OTHER): Payer: Medicare Other | Admitting: Surgery

## 2013-10-06 ENCOUNTER — Encounter (INDEPENDENT_AMBULATORY_CARE_PROVIDER_SITE_OTHER): Payer: Self-pay | Admitting: Surgery

## 2013-10-06 VITALS — BP 138/86 | HR 64 | Temp 97.0°F | Resp 16 | Ht 64.0 in | Wt 192.0 lb

## 2013-10-06 DIAGNOSIS — B079 Viral wart, unspecified: Secondary | ICD-10-CM

## 2013-10-06 DIAGNOSIS — L988 Other specified disorders of the skin and subcutaneous tissue: Secondary | ICD-10-CM | POA: Insufficient documentation

## 2013-10-06 DIAGNOSIS — L820 Inflamed seborrheic keratosis: Secondary | ICD-10-CM | POA: Diagnosis not present

## 2013-10-06 DIAGNOSIS — N649 Disorder of breast, unspecified: Secondary | ICD-10-CM

## 2013-10-06 DIAGNOSIS — L821 Other seborrheic keratosis: Secondary | ICD-10-CM | POA: Diagnosis not present

## 2013-10-06 NOTE — Progress Notes (Signed)
Patient ID: Christina Carroll, female   DOB: 1946/01/12, 68 y.o.   MRN: [id number]  Chief Complaint  Patient presents with  . Other    new pt- eval skin lesion on breast    HPI Christina Carroll is a 68 y.o. female.  Patient sent at the request of Dr. Moshe Cipro were raised skin lesion left medial breast. The area has been there  for a number of months. It is raised. She had the area seen by a dermatologist who cauterize it but it recurred. She would like the area removed because causing discomfort, bleeding and has recurred quickly. HPI  Past Medical History  Diagnosis Date  . Obesity   . Thyroid disorder   . Hyperlipidemia   . Hypertension   . Arthritis     Past Surgical History  Procedure Laterality Date  . Lumpectomy removed from thoracic spine area  1999  . Bilateral foot surgery bone removed  2000  . Colonoscopy  11/30/2011    Procedure: COLONOSCOPY;  Surgeon: Rogene Houston, MD;  Location: [facility];  Service: Endoscopy;  Laterality: N/A;  2    Family History  Problem Relation Age of Onset  . Stroke Mother   . Diabetes Mother   . Hypertension Mother   . Coronary artery disease Father   . Arthritis    . Cancer    . Cancer Maternal Aunt     breast    Social History History  Substance Use Topics  . Smoking status: Never Smoker   . Smokeless tobacco: Not on file  . Alcohol Use: No    No Known Allergies  Current Outpatient Prescriptions  Medication Sig Dispense Refill  . aspirin (ASPIRIN LOW DOSE) 81 MG EC tablet Take 81 mg by mouth daily.       . calcium-vitamin D (OSCAL WITH D) 500-200 MG-UNIT TABS TAKE (1) TABLET BY MOUTH (3) TIMES DAILY.  90 tablet  3  . fluticasone (FLONASE) 50 MCG/ACT nasal spray Place 2 sprays into the nose daily as needed for rhinitis. For allergies  16 g  2  . moexipril-hydrochlorothiazide (UNIRETIC) 15-25 MG per tablet Take 1 tablet by mouth daily.  90 tablet  1  . NIFEdipine (PROCARDIA XL) 60 MG 24 hr tablet Take 1 tablet  (60 mg total) by mouth daily.  90 tablet  1  . pravastatin (PRAVACHOL) 80 MG tablet Take 1 tablet (80 mg total) by mouth daily.  90 tablet  3   No current facility-administered medications for this visit.    Review of Systems Review of Systems  Constitutional: Negative for fever, chills and unexpected weight change.  HENT: Negative for congestion, hearing loss, sore throat, trouble swallowing and voice change.   Eyes: Negative for visual disturbance.  Respiratory: Negative for cough and wheezing.   Cardiovascular: Negative for chest pain, palpitations and leg swelling.  Gastrointestinal: Negative for nausea, vomiting, abdominal pain, diarrhea, constipation, blood in stool, abdominal distention and anal bleeding.  Genitourinary: Negative for hematuria, vaginal bleeding and difficulty urinating.  Musculoskeletal: Negative for arthralgias.  Skin: Negative for rash and wound.  Neurological: Negative for seizures, syncope and headaches.  Hematological: Negative for adenopathy. Does not bruise/bleed easily.  Psychiatric/Behavioral: Negative for confusion.    Blood pressure 138/86, pulse 64, temperature 97 F (36.1 C), temperature source Temporal, resp. rate 16, height 5\' 4"  (1.626 m), weight 192 lb (87.091 kg).  Physical Exam Physical Exam  Constitutional: She appears well-developed and well-nourished.  HENT:  Head: Normocephalic.  Mouth/Throat: No oropharyngeal exudate.  Eyes: EOM are normal. Pupils are equal, round, and reactive to light.  Neck: Normal range of motion.  Pulmonary/Chest:    Psychiatric: She has a normal mood and affect. Her behavior is normal. Judgment and thought content normal.    Data Reviewed Dr Griffin Dakin notes  Assessment    left breast skin lesion 0.8 cm suspicious pigmented raised and ulcerated    Plan    Recommend excisional biopsy in the office today. Risk discussed. Patient would like this done today. Informed consent obtained. The skin over  medial left breast prepped with alcohol. 1 %  percent lidocaine with epinephrine  used for local anesthesia injected at the base of the skin lesion which was raised. Scalpel used to excise the lesion and this passed off the field. Sent to pathology for evaluation. Dry dressing applied. Patient tolerated procedure well. Will return to clinic as needed.       Christina Carroll A. 10/06/2013, 3:10 PM

## 2013-10-06 NOTE — Patient Instructions (Signed)
Remove drssing Tuesday and shower.  Can recover with band aid and change daily.

## 2013-10-09 ENCOUNTER — Telehealth (INDEPENDENT_AMBULATORY_CARE_PROVIDER_SITE_OTHER): Payer: Self-pay

## 2013-10-09 NOTE — Telephone Encounter (Signed)
Informed pt of message below. Pt verbalized understanding  

## 2013-10-09 NOTE — Telephone Encounter (Signed)
Pt's pathology is benign.

## 2013-11-06 ENCOUNTER — Telehealth: Payer: Self-pay

## 2013-11-06 MED ORDER — LISINOPRIL-HYDROCHLOROTHIAZIDE 20-25 MG PO TABS
1.0000 | ORAL_TABLET | Freq: Every day | ORAL | Status: DC
Start: 2013-11-06 — End: 2013-12-02

## 2013-11-06 NOTE — Telephone Encounter (Signed)
Lisinopril HCTZ is the closest alternative or if you wanted to go with an ARB- valsartan HCTZ, losartan HCTZ

## 2013-11-06 NOTE — Telephone Encounter (Signed)
Need the options pls

## 2013-11-06 NOTE — Addendum Note (Signed)
Addended by: Fayrene Helper on: 11/06/2013 04:28 PM   Modules accepted: Orders

## 2013-11-06 NOTE — Telephone Encounter (Signed)
Moexipril/HCTZ is being discontinued. Her mail order states they are not able to get it anymore and even if she was able to get it through another pharmacy it was just a matter of time before they ran out too. She has 2 weeks supply left and wants it changed to something else and sent to her mail order. Please advise

## 2013-11-06 NOTE — Telephone Encounter (Signed)
Alternative printed, pls proceed as discussed

## 2013-11-07 NOTE — Telephone Encounter (Signed)
Pt aware.

## 2013-12-02 DIAGNOSIS — E8881 Metabolic syndrome: Secondary | ICD-10-CM | POA: Insufficient documentation

## 2013-12-02 DIAGNOSIS — E669 Obesity, unspecified: Secondary | ICD-10-CM | POA: Insufficient documentation

## 2013-12-02 HISTORY — DX: Metabolic syndrome: E88.81

## 2013-12-02 HISTORY — DX: Metabolic syndrome: E88.810

## 2013-12-02 NOTE — Assessment & Plan Note (Signed)
Deteriorated. Patient re-educated about  the importance of commitment to a  minimum of 150 minutes of exercise per week. The importance of healthy food choices with portion control discussed. Encouraged to start a food diary, count calories and to consider  joining a support group. Sample diet sheets offered. Goals set by the patient for the next several months.    

## 2013-12-02 NOTE — Assessment & Plan Note (Signed)
The increased risk of cardiovascular disease associated with this diagnosis, and the need to consistently work on lifestyle to change this is discussed. Following  a  heart healthy diet ,commitment to 30 minutes of exercise at least 5 days per week, as well as control of blood sugar and cholesterol , and achieving a healthy weight are all the areas to be addressed .  

## 2013-12-02 NOTE — Progress Notes (Signed)
Subjective:    Patient ID: Rozelle Logan, female    DOB: 1945/09/19, 68 y.o.   MRN: [id number]  HPI The PT is here for follow up and re-evaluation of chronic medical conditions, medication management and review of any available recent lab and radiology data.  Preventive health is updated, specifically  Cancer screening and Immunization.   Questions or concerns regarding consultations or procedures which the PT has had in the interim are  addressed. The PT denies any adverse reactions to current medications since the last visit.  Concerned about recurrent breast mole wants this removed  No real commitment to regular physical activity and dietary change and has been taking incorrect dose of statin, inadvertently     Review of Systems See HPI Denies recent fever or chills. Denies sinus pressure, nasal congestion, ear pain or sore throat. Denies chest congestion, productive cough or wheezing. Denies chest pains, palpitations and leg swelling Denies abdominal pain, nausea, vomiting,diarrhea or constipation.   Denies dysuria, frequency, hesitancy or incontinence. Denies joint pain, swelling and limitation in mobility. Denies headaches, seizures, numbness, or tingling. Denies depression, anxiety or insomnia. Denies skin break down or rash.        Objective:   Physical Exam BP 130/82  Pulse 76  Resp 18  Ht 5\' 3"  (1.6 m)  Wt 192 lb 0.6 oz (87.109 kg)  BMI 34.03 kg/m2  SpO2 98% Patient alert and oriented and in no cardiopulmonary distress.  HEENT: No facial asymmetry, EOMI,   oropharynx pink and moist.  Neck supple no JVD, no mass.  Chest: Clear to auscultation bilaterally.  CVS: S1, S2 no murmurs, no S3.Regular rate.  ABD: Soft non tender.   Ext: No edema  MS: Adequate ROM spine, shoulders, hips and knees.  Skin: Intact, no ulcerations or rash noted.  Psych: Good eye contact, normal affect. Memory intact not anxious or depressed appearing.  CNS: CN 2-12  intact, power,  normal throughout.no focal deficits noted.        Assessment & Plan:  Benign pigmented mole Recurrent left breast mole, following recent cautery, will refer to breast surgeon  HYPERTENSION Controlled, no change in medication DASH diet and commitment to daily physical activity for a minimum of 30 minutes discussed and encouraged, as a part of hypertension management. The importance of attaining a healthy weight is also discussed.   Seasonal allergies Controlled, no change in medication   Prediabetes Unchanged Patient educated about the importance of limiting  Carbohydrate intake , the need to commit to daily physical activity for a minimum of 30 minutes , and to commit weight loss. The fact that changes in all these areas will reduce or eliminate all together the development of diabetes is stressed.     OBESITY Deteriorated. Patient re-educated about  the importance of commitment to a  minimum of 150 minutes of exercise per week. The importance of healthy food choices with portion control discussed. Encouraged to start a food diary, count calories and to consider  joining a support group. Sample diet sheets offered. Goals set by the patient for the next several months.     UNSPECIFIED DISORDER OF THYROID Followed by endo, had recent biopsy, which was benign, f/u in 1 year  HYPERLIPIDEMIA Uncontrolled, inc dose as before to pravachol 80  Metabolic syndrome X The increased risk of cardiovascular disease associated with this diagnosis, and the need to consistently work on lifestyle to change this is discussed. Following  a  heart healthy diet ,commitment to  30 minutes of exercise at least 5 days per week, as well as control of blood sugar and cholesterol , and achieving a healthy weight are all the areas to be addressed .   Obesity (BMI 30.0-34.9) Deteriorated. Patient re-educated about  the importance of commitment to a  minimum of 150 minutes of exercise  per week. The importance of healthy food choices with portion control discussed. Encouraged to start a food diary, count calories and to consider  joining a support group. Sample diet sheets offered. Goals set by the patient for the next several months.

## 2013-12-24 ENCOUNTER — Ambulatory Visit (INDEPENDENT_AMBULATORY_CARE_PROVIDER_SITE_OTHER): Payer: Medicare Other

## 2013-12-24 DIAGNOSIS — Z23 Encounter for immunization: Secondary | ICD-10-CM | POA: Diagnosis not present

## 2014-01-28 DIAGNOSIS — E669 Obesity, unspecified: Secondary | ICD-10-CM | POA: Diagnosis not present

## 2014-01-28 DIAGNOSIS — E785 Hyperlipidemia, unspecified: Secondary | ICD-10-CM | POA: Diagnosis not present

## 2014-01-28 DIAGNOSIS — R7309 Other abnormal glucose: Secondary | ICD-10-CM | POA: Diagnosis not present

## 2014-01-29 LAB — COMPREHENSIVE METABOLIC PANEL
ALT: 24 U/L (ref 0–35)
AST: 22 U/L (ref 0–37)
Albumin: 4.1 g/dL (ref 3.5–5.2)
Alkaline Phosphatase: 82 U/L (ref 39–117)
BILIRUBIN TOTAL: 0.9 mg/dL (ref 0.2–1.2)
BUN: 18 mg/dL (ref 6–23)
CO2: 28 mEq/L (ref 19–32)
Calcium: 9.8 mg/dL (ref 8.4–10.5)
Chloride: 103 mEq/L (ref 96–112)
Creat: 0.92 mg/dL (ref 0.50–1.10)
Glucose, Bld: 76 mg/dL (ref 70–99)
Potassium: 4.5 mEq/L (ref 3.5–5.3)
Sodium: 139 mEq/L (ref 135–145)
Total Protein: 6.9 g/dL (ref 6.0–8.3)

## 2014-01-29 LAB — LIPID PANEL
Cholesterol: 178 mg/dL (ref 0–200)
HDL: 67 mg/dL (ref >39)
LDL Cholesterol: 94 mg/dL (ref 0–99)
Total CHOL/HDL Ratio: 2.7 Ratio
Triglycerides: 85 mg/dL (ref <150)
VLDL: 17 mg/dL (ref 0–40)

## 2014-01-29 LAB — HEMOGLOBIN A1C
HEMOGLOBIN A1C: 5.9 % — AB (ref <5.7)
MEAN PLASMA GLUCOSE: 123 mg/dL — AB (ref <117)

## 2014-01-29 LAB — VITAMIN D 25 HYDROXY (VIT D DEFICIENCY, FRACTURES): Vit D, 25-Hydroxy: 36 ng/mL (ref 30–89)

## 2014-02-02 ENCOUNTER — Encounter: Payer: Self-pay | Admitting: Family Medicine

## 2014-02-02 ENCOUNTER — Ambulatory Visit (INDEPENDENT_AMBULATORY_CARE_PROVIDER_SITE_OTHER): Payer: Medicare Other | Admitting: Family Medicine

## 2014-02-02 VITALS — BP 122/80 | HR 82 | Resp 16 | Ht 64.0 in | Wt 190.8 lb

## 2014-02-02 DIAGNOSIS — Z0000 Encounter for general adult medical examination without abnormal findings: Secondary | ICD-10-CM | POA: Insufficient documentation

## 2014-02-02 DIAGNOSIS — E785 Hyperlipidemia, unspecified: Secondary | ICD-10-CM | POA: Diagnosis not present

## 2014-02-02 DIAGNOSIS — R9431 Abnormal electrocardiogram [ECG] [EKG]: Secondary | ICD-10-CM | POA: Diagnosis not present

## 2014-02-02 DIAGNOSIS — I10 Essential (primary) hypertension: Secondary | ICD-10-CM

## 2014-02-02 DIAGNOSIS — R7303 Prediabetes: Secondary | ICD-10-CM

## 2014-02-02 NOTE — Assessment & Plan Note (Signed)
Pt with several risk factors for CAD, HTN, hyperlipidemia, IGT, f/h CAD, questionable inferior ischemia  On today's EKG, refer to cardiology for further eval

## 2014-02-02 NOTE — Assessment & Plan Note (Signed)
Annual exam as documented. Counseling done  re healthy lifestyle involving commitment to 150 minutes exercise per week, heart healthy diet, and attaining healthy weight.The importance of adequate sleep also discussed. Regular seat belt use and home safety, is also discussed. Changes in health habits are decided on by the patient with goals and time frames  set for achieving them. Immunization and cancer screening needs are specifically addressed at this visit. EKG done at visit

## 2014-02-02 NOTE — Patient Instructions (Addendum)
F/u in 4.5 month, call if you need me before  Please check on TdAP coverage since you have a grand baby on the way!!  Please start exercise and reduce sugar and carbs  EKG today suggests possible heart damage , you are referred to cardiology for furhter evaluation  CBC. Fasting lipid, cmp and EGFr and hBA1C in 4.5 month

## 2014-02-02 NOTE — Progress Notes (Signed)
Subjective:    Patient ID: Christina Carroll, female    DOB: May 08, 1945, 68 y.o.   MRN: [id number]  HPI Preventive Screening-Counseling & Management   Patient present here today for an initial    Medicare  wellness visit. No other health concerns, labs are reviewed Pt has f/h CAD and several risk factors for heart disease, no EKG on file so one done at visit which suggests possible old inferior infarct, she is referred top cardiology for further evaluation   Current Problems (verified)   Medications Prior to Visit Allergies (verified)   PAST HISTORY  Family History (verified)   Social History Divorced, 1 son, retired from [place] [employer]    Risk Factors  Current exercise habits:  No current exercise routine established. Intends to start soon , grand baby on the way!  Dietary issues discussed: Heart healthy diet, fruits and vegetables, tries to limit red meat and carbs, eat more chicken fish and [place]    Cardiac risk factors: Father had coronary artery disease, died in 83 and he is 54   Depression Screen  (Note: if answer to either of the following is "Yes", a more complete depression screening is indicated)   Over the past two weeks, have you felt down, depressed or hopeless? No  Over the past two weeks, have you felt little interest or pleasure in doing things? No  Have you lost interest or pleasure in daily life? No  Do you often feel hopeless? No  Do you cry easily over simple problems? No   Activities of Daily Living  In your present state of health, do you have any difficulty performing the following activities?  Driving?: No Managing money?: No Feeding yourself?:No Getting from bed to chair?:No Climbing a flight of stairs?:No Preparing food and eating?:No Bathing or showering?:No Getting dressed?:No Getting to the toilet?:No Using the toilet?:No Moving around from place to place?: No  Fall Risk Assessment In the past year have you fallen or had  a near fall?:No Are you currently taking any medications that make you dizzy?:No   Hearing Difficulties: No Do you often ask people to speak up or repeat themselves?: sometimes  Do you experience ringing or noises in your ears?:No Do you have difficulty understanding soft or whispered voices?:sometimes, but holding off on formal eval  Cognitive Testing  Alert? Yes Normal Appearance?Yes  Oriented to person? Yes Place? Yes  Time? Yes  Displays appropriate judgment?Yes  Can read the correct time from a watch face? yes Are you having problems remembering things? No  Advanced Directives have been discussed with the patient?Yes, no living will currently, will give brochure  , full code ,   List the Names of Other Physician/Practitioners you currently use:  Dr Bary Leriche ([facility])   Indicate any recent Medical Services you may have received from other than [facility] providers in the past year (date may be approximate).   Assessment:    Initial  Wellness Exam   Plan:     I have personally reviewed:  The patient's medical and social history  Their use of alcohol, tobacco or illicit drugs  Their current medications and supplements  The patient's functional ability including ADLs,fall risks, home safety risks, cognitive, and hearing and visual impairment  Diet and physical activities  Evidence for depression or mood disorders  The patient's weight, height, BMI, and visual acuity have been recorded in the chart. I have made referrals, counseling, and provided education to the patient based on review of the  above and I have provided the patient with a written personalized care plan for preventive services.      Review of Systems     Objective:   Physical Exam  BP 122/80 mmHg  Pulse 82  Resp 16  Ht 5\' 4"  (1.626 m)  Wt 190 lb 12.8 oz (86.546 kg)  BMI 32.73 kg/m2  SpO2 99%   EKG: sinus rhythm , rate of 59, questionable inferior wall ischemia, will refer to cardiology for furhter  evaluation Father had CAD, pt has metabolic syndrome, she has IGT, obesity and dylipidemia, also no regular exercise, which she needs to start      Assessment & Plan:

## 2014-02-06 ENCOUNTER — Encounter: Payer: Self-pay | Admitting: Family Medicine

## 2014-02-18 ENCOUNTER — Encounter: Payer: Self-pay | Admitting: *Deleted

## 2014-02-18 ENCOUNTER — Ambulatory Visit (INDEPENDENT_AMBULATORY_CARE_PROVIDER_SITE_OTHER): Payer: Medicare Other | Admitting: Cardiology

## 2014-02-18 ENCOUNTER — Encounter: Payer: Self-pay | Admitting: Cardiology

## 2014-02-18 VITALS — BP 158/102 | HR 76 | Ht 64.0 in | Wt 189.0 lb

## 2014-02-18 DIAGNOSIS — I10 Essential (primary) hypertension: Secondary | ICD-10-CM | POA: Diagnosis not present

## 2014-02-18 DIAGNOSIS — R9431 Abnormal electrocardiogram [ECG] [EKG]: Secondary | ICD-10-CM

## 2014-02-18 DIAGNOSIS — E785 Hyperlipidemia, unspecified: Secondary | ICD-10-CM | POA: Diagnosis not present

## 2014-02-18 NOTE — Assessment & Plan Note (Signed)
Blood pressure elevated today, patient somewhat anxious about visit. Typically has been better controlled. No changes made.

## 2014-02-18 NOTE — Patient Instructions (Signed)
Your physician recommends that you schedule a follow-up appointment after your Lexi Scan  Your physician recommends that you continue on your current medications as directed. Please refer to the Current Medication list given to you today.  Your physician has requested that you have a lexiscan myoview. For further information please visit [email or web address]. Please follow instruction sheet, as given.  Thank you for choosing [facility]!!

## 2014-02-18 NOTE — Progress Notes (Signed)
25  Reason for visit: Abnormal ECG  Clinical Summary Christina Carroll is a 68 y.o.female referred for cardiology consultation by Dr. Moshe Cipro. She had a recent wellness examination, part of which included an ECG which I reviewed below. As far as symptoms are concerned, she does endorse dyspnea on exertion that has been present for several months to years, perhaps somewhat more noticeable over time. She describes NYHA class II symptoms largely. No exertional chest pain or palpitations. She remains functional with basic ADLs, does feel limited when she walks up inclines.  Recent ECG reviewed finding sinus rhythm with nonspecific ST-T changes and lead motion artifact particularly in the inferior leads. I discussed this with her today. No old tracing for comparison. She states that she had a stress test many years ago at [facility] - does not specifically recall the results, although remembers that things looked "okay."  Recent lab work in November showed cholesterol 178, triglycerides 85, HDL 67, LDL 94, hemoglobin A1c 5.9, potassium 4.5, BUN 18, creatinine 0.9, normal AST and ALT. She continues on Pravachol.  Based on Framingham risk score, ten-year cardiovascular event risk calculated at 17%, although with optimal treatment down to approximately 5%. We discussed this today, overall she is in an intermediate risk range.  She does not exercise, has been considering a walking regimen. She is retired, worked at the [employer] office for 26 years.  No Known Allergies  Current Outpatient Prescriptions  Medication Sig Dispense Refill  . aspirin (ASPIRIN LOW DOSE) 81 MG EC tablet Take 81 mg by mouth daily.     . calcium-vitamin D (OSCAL WITH D) 500-200 MG-UNIT TABS TAKE (1) TABLET BY MOUTH (3) TIMES DAILY. 90 tablet 3  . fluticasone (FLONASE) 50 MCG/ACT nasal spray Place 2 sprays into the nose daily as needed for rhinitis. For allergies 16 g 2  . moexipril-hydrochlorothiazide (UNIRETIC) 15-25 MG per tablet Take 1  tablet by mouth daily. 90 tablet 1  . NIFEdipine (PROCARDIA XL) 60 MG 24 hr tablet Take 1 tablet (60 mg total) by mouth daily. 90 tablet 1  . pravastatin (PRAVACHOL) 80 MG tablet Take 1 tablet (80 mg total) by mouth daily. 90 tablet 3   No current facility-administered medications for this visit.    Past Medical History  Diagnosis Date  . Obesity   . Hyperlipidemia   . Essential hypertension   . Arthritis   . Lymphocytic thyroiditis   . Prediabetes     Past Surgical History  Procedure Laterality Date  . Lumpectomy removed from thoracic spine area  1999  . Bilateral foot surgery bone removed  2000  . Colonoscopy  11/30/2011    Procedure: COLONOSCOPY;  Surgeon: Rogene Houston, MD;  Location: [facility];  Service: Endoscopy;  Laterality: N/A;  47    Family History  Problem Relation Age of Onset  . Stroke Mother   . Diabetes Mother   . Hypertension Mother   . Coronary artery disease Father   . Arthritis    . Cancer    . Breast cancer Maternal Aunt     Social History Ms. Limon reports that she has never smoked. She has never used smokeless tobacco. Ms. Kirsch reports that she does not drink alcohol.  Review of Systems Complete review of systems negative except as otherwise outlined in the clinical summary and also the following. No palpitations or syncope. No orthopnea or PND. Stable appetite. Claudication.  Physical Examination Filed Vitals:   02/18/14 0827  BP: 158/102  Pulse: 76  Filed Weights   02/18/14 0827  Weight: 189 lb (85.73 kg)   Overweight woman, appears comfortable at rest although somewhat anxious about today's visit. HEENT: Conjunctiva and lids normal, oropharynx clear. Neck: Supple, no elevated JVP or carotid bruits, no thyromegaly. Lungs: Clear to auscultation, nonlabored breathing at rest. Cardiac: Regular rate and rhythm, no S3 or significant systolic murmur, no pericardial rub. Abdomen: Soft, nontender, bowel sounds present, no  guarding or rebound. Extremities: No pitting edema, distal pulses 2+. Skin: Warm and dry. Musculoskeletal: No kyphosis. Neuropsychiatric: Alert and oriented x3, affect grossly appropriate.   Problem List and Plan   Abnormal ECG Nonspecific ST-T wave changes, not clearly diagnostic for prior infarct. Cardiac risk factors include prediabetes, hypertension, hyperlipidemia however. She is in intermediate range for adverse cardiac events based on review of Framingham risk score. Also reporting dyspnea on exertion over the last several years, no chest pain. In light of this, we will proceed with a Lexiscan Cardiolite on medical therapy for additional risk stratification. Unless we uncover high risk features, would likely continue medical therapy with aggressive risk factor modification and a walking regimen. We will call her with the results.  Hyperlipidemia Fairly well controlled on Pravachol. Recent HDL 67, LDL 94.  Essential hypertension Blood pressure elevated today, patient somewhat anxious about visit. Typically has been better controlled. No changes made.    Satira Sark, M.D., F.A.C.C.

## 2014-02-18 NOTE — Assessment & Plan Note (Signed)
Nonspecific ST-T wave changes, not clearly diagnostic for prior infarct. Cardiac risk factors include prediabetes, hypertension, hyperlipidemia however. She is in intermediate range for adverse cardiac events based on review of Framingham risk score. Also reporting dyspnea on exertion over the last several years, no chest pain. In light of this, we will proceed with a Lexiscan Cardiolite on medical therapy for additional risk stratification. Unless we uncover high risk features, would likely continue medical therapy with aggressive risk factor modification and a walking regimen. We will call her with the results.

## 2014-02-18 NOTE — Assessment & Plan Note (Signed)
Fairly well controlled on Pravachol. Recent HDL 67, LDL 94.

## 2014-02-23 ENCOUNTER — Encounter (HOSPITAL_COMMUNITY)
Admission: RE | Admit: 2014-02-23 | Discharge: 2014-02-23 | Disposition: A | Discharge status: Home / Self Care | Payer: Medicare Other | Source: Ambulatory Visit | Attending: Cardiology | Admitting: Cardiology

## 2014-02-23 ENCOUNTER — Encounter (HOSPITAL_COMMUNITY): Payer: Self-pay

## 2014-02-23 ENCOUNTER — Ambulatory Visit (HOSPITAL_COMMUNITY)
Admission: RE | Admit: 2014-02-23 | Discharge: 2014-02-23 | Disposition: A | Discharge status: Home / Self Care | Payer: Medicare Other | Source: Ambulatory Visit | Attending: Cardiology | Admitting: Cardiology

## 2014-02-23 DIAGNOSIS — R0600 Dyspnea, unspecified: Secondary | ICD-10-CM | POA: Diagnosis not present

## 2014-02-23 DIAGNOSIS — R9431 Abnormal electrocardiogram [ECG] [EKG]: Secondary | ICD-10-CM | POA: Insufficient documentation

## 2014-02-23 DIAGNOSIS — R0609 Other forms of dyspnea: Secondary | ICD-10-CM | POA: Diagnosis not present

## 2014-02-23 MED ORDER — SODIUM CHLORIDE 0.9 % IJ SOLN
10.0000 mL | INTRAMUSCULAR | Status: DC | PRN
  Administered 2014-02-23: 10 mL via INTRAVENOUS
  Filled 2014-02-23: qty 10

## 2014-02-23 MED ORDER — REGADENOSON 0.4 MG/5ML IV SOLN
INTRAVENOUS | Status: AC
  Administered 2014-02-23: 0.4 mg via INTRAVENOUS
  Filled 2014-02-23: qty 5

## 2014-02-23 MED ORDER — TECHNETIUM TC 99M SESTAMIBI GENERIC - CARDIOLITE
10.0000 | Freq: Once | INTRAVENOUS | Status: AC | PRN
  Administered 2014-02-23: 10 via INTRAVENOUS

## 2014-02-23 MED ORDER — TECHNETIUM TC 99M SESTAMIBI - CARDIOLITE
30.0000 | Freq: Once | INTRAVENOUS | Status: AC | PRN
  Administered 2014-02-23: 10:00:00 30 via INTRAVENOUS

## 2014-02-23 MED ORDER — SODIUM CHLORIDE 0.9 % IJ SOLN
INTRAMUSCULAR | Status: AC
  Administered 2014-02-23: 10 mL via INTRAVENOUS
  Filled 2014-02-23: qty 10

## 2014-02-23 MED ORDER — REGADENOSON 0.4 MG/5ML IV SOLN
0.4000 mg | Freq: Once | INTRAVENOUS | Status: AC
  Administered 2014-02-23: 0.4 mg via INTRAVENOUS

## 2014-02-23 NOTE — Progress Notes (Signed)
Stress Lab Nurses Notes - Christina Carroll  Christina Carroll 02/23/2014 Reason for doing test: abnormal EKG Type of test: Wille Glaser Nurse performing test: Christina Coyer, RN Nuclear Medicine Tech: Christina Carroll Echo Tech: Not Applicable MD performing test: Christina Carroll. Christina Nails, Christina Carroll Family MD:  Christina Carroll Test explained and consent signed: Yes.   IV started: Saline lock flushed Symptoms: Flushing, mild head pressure Treatment/Intervention: None Reason test stopped: protocol completed After recovery IV was: Discontinued via X-ray tech Patient to return to [facility]. Med at : 1050 Patient discharged: Home Patient's Condition upon discharge was: stable Comments: During test BP  134/82 & HR 120.  During recovery BP 144/84 & HR 78.  Symptoms resolved during recovery.  Christina Carroll

## 2014-03-31 ENCOUNTER — Other Ambulatory Visit: Payer: Self-pay | Admitting: Family Medicine

## 2014-04-14 ENCOUNTER — Other Ambulatory Visit: Payer: Self-pay | Admitting: Family Medicine

## 2014-04-16 ENCOUNTER — Other Ambulatory Visit: Payer: Self-pay | Admitting: Family Medicine

## 2014-04-17 ENCOUNTER — Other Ambulatory Visit: Payer: Self-pay

## 2014-04-17 MED ORDER — OYSTER SHELL CALCIUM/D 500-200 MG-UNIT PO TABS: ORAL_TABLET | ORAL | Status: DC

## 2014-04-18 ENCOUNTER — Other Ambulatory Visit: Payer: Self-pay | Admitting: Family Medicine

## 2014-04-20 ENCOUNTER — Other Ambulatory Visit: Payer: Self-pay

## 2014-04-20 MED ORDER — OYSTER SHELL CALCIUM/D 500-200 MG-UNIT PO TABS: ORAL_TABLET | ORAL | Status: DC

## 2014-05-14 ENCOUNTER — Telehealth: Payer: Self-pay | Admitting: *Deleted

## 2014-05-14 MED ORDER — NIFEDIPINE ER OSMOTIC RELEASE 60 MG PO TB24: 60.0000 mg | ORAL_TABLET | Freq: Every day | ORAL | Status: DC

## 2014-05-14 NOTE — Telephone Encounter (Signed)
Med refilled.

## 2014-05-14 NOTE — Telephone Encounter (Signed)
Pt called requesting nifedipine to be refilled to opium [facility]

## 2014-05-21 DIAGNOSIS — E0580 Other thyrotoxicosis without thyrotoxic crisis or storm: Secondary | ICD-10-CM | POA: Diagnosis not present

## 2014-05-21 DIAGNOSIS — E049 Nontoxic goiter, unspecified: Secondary | ICD-10-CM | POA: Diagnosis not present

## 2014-05-28 DIAGNOSIS — E785 Hyperlipidemia, unspecified: Secondary | ICD-10-CM | POA: Diagnosis not present

## 2014-05-28 DIAGNOSIS — I10 Essential (primary) hypertension: Secondary | ICD-10-CM | POA: Diagnosis not present

## 2014-05-28 DIAGNOSIS — E0500 Thyrotoxicosis with diffuse goiter without thyrotoxic crisis or storm: Secondary | ICD-10-CM | POA: Diagnosis not present

## 2014-05-28 DIAGNOSIS — E049 Nontoxic goiter, unspecified: Secondary | ICD-10-CM | POA: Diagnosis not present

## 2014-05-28 DIAGNOSIS — E063 Autoimmune thyroiditis: Secondary | ICD-10-CM | POA: Diagnosis not present

## 2014-05-28 DIAGNOSIS — H0520 Unspecified exophthalmos: Secondary | ICD-10-CM | POA: Diagnosis not present

## 2014-06-23 DIAGNOSIS — E785 Hyperlipidemia, unspecified: Secondary | ICD-10-CM | POA: Diagnosis not present

## 2014-06-23 DIAGNOSIS — R7309 Other abnormal glucose: Secondary | ICD-10-CM | POA: Diagnosis not present

## 2014-06-23 DIAGNOSIS — I10 Essential (primary) hypertension: Secondary | ICD-10-CM | POA: Diagnosis not present

## 2014-06-24 LAB — LIPID PANEL
CHOLESTEROL: 192 mg/dL (ref 0–200)
HDL: 72 mg/dL (ref >=46)
LDL CALC: 105 mg/dL — AB (ref 0–99)
Total CHOL/HDL Ratio: 2.7 Ratio
Triglycerides: 77 mg/dL (ref <150)
VLDL: 15 mg/dL (ref 0–40)

## 2014-06-24 LAB — COMPLETE METABOLIC PANEL WITH GFR
ALK PHOS: 76 U/L (ref 39–117)
ALT: 22 U/L (ref 0–35)
AST: 22 U/L (ref 0–37)
Albumin: 4.3 g/dL (ref 3.5–5.2)
BUN: 20 mg/dL (ref 6–23)
CO2: 32 mEq/L (ref 19–32)
Calcium: 9.8 mg/dL (ref 8.4–10.5)
Chloride: 101 mEq/L (ref 96–112)
Creat: 0.94 mg/dL (ref 0.50–1.10)
GFR, EST AFRICAN AMERICAN: 73 mL/min
GFR, Est Non African American: 63 mL/min
GLUCOSE: 88 mg/dL (ref 70–99)
Potassium: 4.8 mEq/L (ref 3.5–5.3)
SODIUM: 141 meq/L (ref 135–145)
TOTAL PROTEIN: 6.8 g/dL (ref 6.0–8.3)
Total Bilirubin: 1.1 mg/dL (ref 0.2–1.2)

## 2014-06-24 LAB — CBC WITH DIFFERENTIAL/PLATELET
BASOS ABS: 0 10*3/uL (ref 0.0–0.1)
BASOS PCT: 0 % (ref 0–1)
EOS PCT: 1 % (ref 0–5)
Eosinophils Absolute: 0.1 10*3/uL (ref 0.0–0.7)
HEMATOCRIT: 40.3 % (ref 36.0–46.0)
Hemoglobin: 13.3 g/dL (ref 12.0–15.0)
Lymphocytes Relative: 39 % (ref 12–46)
Lymphs Abs: 2.2 10*3/uL (ref 0.7–4.0)
MCH: 26.7 pg (ref 26.0–34.0)
MCHC: 33 g/dL (ref 30.0–36.0)
MCV: 80.9 fL (ref 78.0–100.0)
MONO ABS: 0.7 10*3/uL (ref 0.1–1.0)
MONOS PCT: 12 % (ref 3–12)
Neutro Abs: 2.7 10*3/uL (ref 1.7–7.7)
Neutrophils Relative %: 48 % (ref 43–77)
PLATELETS: 360 10*3/uL (ref 150–400)
RBC: 4.98 MIL/uL (ref 3.87–5.11)
RDW: 15.4 % (ref 11.5–15.5)
WBC: 5.7 10*3/uL (ref 4.0–10.5)

## 2014-06-24 LAB — HEMOGLOBIN A1C
HEMOGLOBIN A1C: 6.2 % — AB (ref <5.7)
Mean Plasma Glucose: 131 mg/dL — ABNORMAL HIGH (ref <117)

## 2014-06-29 ENCOUNTER — Ambulatory Visit (INDEPENDENT_AMBULATORY_CARE_PROVIDER_SITE_OTHER): Payer: Medicare Other | Admitting: Family Medicine

## 2014-06-29 ENCOUNTER — Encounter: Payer: Self-pay | Admitting: Family Medicine

## 2014-06-29 VITALS — BP 120/82 | HR 83 | Resp 16 | Ht 64.0 in | Wt 188.0 lb

## 2014-06-29 DIAGNOSIS — E785 Hyperlipidemia, unspecified: Secondary | ICD-10-CM | POA: Diagnosis not present

## 2014-06-29 DIAGNOSIS — I10 Essential (primary) hypertension: Secondary | ICD-10-CM

## 2014-06-29 DIAGNOSIS — E8881 Metabolic syndrome: Secondary | ICD-10-CM

## 2014-06-29 DIAGNOSIS — E669 Obesity, unspecified: Secondary | ICD-10-CM

## 2014-06-29 DIAGNOSIS — Z23 Encounter for immunization: Secondary | ICD-10-CM | POA: Diagnosis not present

## 2014-06-29 DIAGNOSIS — R7309 Other abnormal glucose: Secondary | ICD-10-CM

## 2014-06-29 DIAGNOSIS — R7303 Prediabetes: Secondary | ICD-10-CM

## 2014-06-29 NOTE — Assessment & Plan Note (Signed)
Deteriorated Patient educated about the importance of limiting  Carbohydrate intake , the need to commit to daily physical activity for a minimum of 30 minutes , and to commit weight loss. The fact that changes in all these areas will reduce or eliminate all together the development of diabetes is stressed.   Diabetic Labs Latest Ref Rng 06/23/2014 01/28/2014 09/15/2013 03/26/2013 06/21/2012  HbA1c <5.7 % 6.2(H) 5.9(H) 5.9(H) - 5.9(H)  Chol 0 - 200 mg/dL 192 178 197 204(H) 209(H)  HDL >=46 mg/dL 72 67 69 67 62  Calc LDL 0 - 99 mg/dL 105(H) 94 113(H) 124(H) 129(H)  Triglycerides <150 mg/dL 77 85 74 64 89  Creatinine 0.50 - 1.10 mg/dL 0.94 0.92 1.04 0.83 0.88   BP/Weight 06/29/2014 02/18/2014 02/02/2014 10/06/2013 09/22/2013 04/03/2013 7/82/9562  Systolic BP 130 865 784 696 295 284 132  Diastolic BP 82 440 80 86 82 84 80  Wt. (Lbs) 188 189 190.8 192 192.04 187 186  BMI 32.25 32.43 32.73 32.94 34.03 33.13 32.96   No flowsheet data found.

## 2014-06-29 NOTE — Patient Instructions (Signed)
Annual physical exam in early September, please call if you need me before  Please work on reducing carbs, sweets and fried foods, increase fruit and vegetable intake  Commit to 64 ounces water daily  It is important that you exercise regularly at least 30 minutes 6 times a week. If you develop chest pain, have severe difficulty breathing, or feel very tired, stop exercising immediately and seek medical attention   TdAP today  Fasting lipid, cmp and EGFr, HBA1C in September  Excellent Bp  Thanks for choosing [facility], we consider it a privelige to serve you.

## 2014-06-29 NOTE — Assessment & Plan Note (Signed)
Deteriorated. Patient re-educated about  the importance of commitment to a  minimum of 150 minutes of exercise per week.  The importance of healthy food choices with portion control discussed. Encouraged to start a food diary, count calories and to consider  joining a support group. Sample diet sheets offered. Goals set by the patient for the next several months.   Weight /BMI 06/29/2014 02/18/2014 02/02/2014  WEIGHT 188 lb 189 lb 190 lb 12.8 oz  HEIGHT 5\' 4"  5\' 4"  5\' 4"   BMI 32.25 kg/m2 32.43 kg/m2 32.73 kg/m2    Current exercise per week 60 minutes.

## 2014-06-29 NOTE — Assessment & Plan Note (Signed)
Controlled, no change in medication DASH diet and commitment to daily physical activity for a minimum of 30 minutes discussed and encouraged, as a part of hypertension management. The importance of attaining a healthy weight is also discussed.  BP/Weight 06/29/2014 02/18/2014 02/02/2014 10/06/2013 09/22/2013 04/03/2013 1/77/1165  Systolic BP 790 383 338 329 191 660 600  Diastolic BP 82 459 80 86 82 84 80  Wt. (Lbs) 188 189 190.8 192 192.04 187 186  BMI 32.25 32.43 32.73 32.94 34.03 33.13 32.96

## 2014-07-03 ENCOUNTER — Telehealth: Payer: Self-pay

## 2014-07-03 ENCOUNTER — Other Ambulatory Visit: Payer: Self-pay

## 2014-07-03 MED ORDER — BENZONATATE 100 MG PO CAPS: 100.0000 mg | ORAL_CAPSULE | Freq: Three times a day (TID) | ORAL | Status: DC | PRN

## 2014-07-03 MED ORDER — PROMETHAZINE-DM 6.25-15 MG/5ML PO SYRP: 5.0000 mL | ORAL_SOLUTION | Freq: Every evening | ORAL | Status: DC | PRN

## 2014-07-03 NOTE — Telephone Encounter (Signed)
pls send tessalon perles 100 mg 3 times daily as needed #20 only, and also offer phenergan dm  5 cc at bedtime as needed x 118 ml   Advise her to call back for recurrent fever , chills or regression/ worsening

## 2014-07-03 NOTE — Telephone Encounter (Signed)
Patient aware.

## 2014-07-03 NOTE — Telephone Encounter (Signed)
States after she got the tdap she started feeling sickly and woke up with a bad cough Tuesday. Producing light yellow mucus and sinuses are congested. Ran a low grade temp Tuesday but not now, just still suffering with the cough. Tried coricidan HBP with no relief. Please advise

## 2014-07-16 ENCOUNTER — Other Ambulatory Visit: Payer: Self-pay

## 2014-07-16 MED ORDER — FLUTICASONE PROPIONATE 50 MCG/ACT NA SUSP: 2.0000 | Freq: Every day | NASAL | Status: DC

## 2014-08-30 DIAGNOSIS — Z23 Encounter for immunization: Secondary | ICD-10-CM | POA: Insufficient documentation

## 2014-08-30 NOTE — Assessment & Plan Note (Signed)
The increased risk of cardiovascular disease associated with this diagnosis, and the need to consistently work on lifestyle to change this is discussed. Following  a  heart healthy diet ,commitment to 30 minutes of exercise at least 5 days per week, as well as control of blood sugar and cholesterol , and achieving a healthy weight are all the areas to be addressed .  

## 2014-08-30 NOTE — Assessment & Plan Note (Addendum)
Controlled, though LDL still above goal, no change in medication Hyperlipidemia:Low fat diet discussed and encouraged.   Lipid Panel  Lab Results  Component Value Date   CHOL 192 06/23/2014   HDL 72 06/23/2014   LDLCALC 105* 06/23/2014   TRIG 77 06/23/2014   CHOLHDL 2.7 06/23/2014

## 2014-08-30 NOTE — Assessment & Plan Note (Signed)
After obtaining informed consent, the vaccine is  administered by LPN.  

## 2014-08-30 NOTE — Progress Notes (Signed)
Subjective:    Patient ID: Christina Carroll, female    DOB: 28-Oct-1945, 69 y.o.   MRN: [id number]  HPI The PT is here for follow up and re-evaluation of chronic medical conditions, medication management and review of any available recent lab and radiology data.  Preventive health is updated, specifically  Cancer screening and Immunization.   Questions or concerns regarding consultations or procedures which the PT has had in the interim are  addressed. The PT denies any adverse reactions to current medications since the last visit.  There are no new concerns. Concerned re changes in her son's life , but is pressing forth in faith Still no regular exercise commitment    Review of Systems See HPI Denies recent fever or chills. Denies sinus pressure, nasal congestion, ear pain or sore throat. Denies chest congestion, productive cough or wheezing. Denies chest pains, palpitations and leg swelling Denies abdominal pain, nausea, vomiting,diarrhea or constipation.   Denies dysuria, frequency, hesitancy or incontinence. Denies joint pain, swelling and limitation in mobility. Denies headaches, seizures, numbness, or tingling. Denies skin break down or rash.        Objective:   Physical Exam BP 120/82 mmHg  Pulse 83  Resp 16  Ht 5\' 4"  (1.626 m)  Wt 188 lb (85.276 kg)  BMI 32.25 kg/m2  SpO2 98% Patient alert and oriented and in no cardiopulmonary distress.  HEENT: No facial asymmetry, EOMI,   oropharynx pink and moist.  Neck supple no JVD, no mass.  Chest: Clear to auscultation bilaterally.  CVS: S1, S2 no murmurs, no S3.Regular rate.  ABD: Soft non tender.   Ext: No edema  MS: Adequate ROM spine, shoulders, hips and knees.  Skin: Intact, no ulcerations or rash noted.  Psych: Good eye contact, normal affect. Memory intact mildly  anxious not  depressed appearing.  CNS: CN 2-12 intact, power,  normal throughout.no focal deficits noted.        Assessment &  Plan:  Essential hypertension Controlled, no change in medication DASH diet and commitment to daily physical activity for a minimum of 30 minutes discussed and encouraged, as a part of hypertension management. The importance of attaining a healthy weight is also discussed.  BP/Weight 06/29/2014 02/18/2014 02/02/2014 10/06/2013 09/22/2013 04/03/2013 6/70/1410  Systolic BP 301 314 388 875 797 282 060  Diastolic BP 82 156 80 86 82 84 80  Wt. (Lbs) 188 189 190.8 192 192.04 187 186  BMI 32.25 32.43 32.73 32.94 34.03 33.13 32.96        Prediabetes Deteriorated Patient educated about the importance of limiting  Carbohydrate intake , the need to commit to daily physical activity for a minimum of 30 minutes , and to commit weight loss. The fact that changes in all these areas will reduce or eliminate all together the development of diabetes is stressed.   Diabetic Labs Latest Ref Rng 06/23/2014 01/28/2014 09/15/2013 03/26/2013 06/21/2012  HbA1c <5.7 % 6.2(H) 5.9(H) 5.9(H) - 5.9(H)  Chol 0 - 200 mg/dL 192 178 197 204(H) 209(H)  HDL >=46 mg/dL 72 67 69 67 62  Calc LDL 0 - 99 mg/dL 105(H) 94 113(H) 124(H) 129(H)  Triglycerides <150 mg/dL 77 85 74 64 89  Creatinine 0.50 - 1.10 mg/dL 0.94 0.92 1.04 0.83 0.88   BP/Weight 06/29/2014 02/18/2014 02/02/2014 10/06/2013 09/22/2013 04/03/2013 1/53/7943  Systolic BP 276 147 092 957 473 403 709  Diastolic BP 82 643 80 86 82 84 80  Wt. (Lbs) 188 189 190.8 192 192.04 187  186  BMI 32.25 32.43 32.73 32.94 34.03 33.13 32.96   No flowsheet data found.     Obesity Deteriorated. Patient re-educated about  the importance of commitment to a  minimum of 150 minutes of exercise per week.  The importance of healthy food choices with portion control discussed. Encouraged to start a food diary, count calories and to consider  joining a support group. Sample diet sheets offered. Goals set by the patient for the next several months.   Weight /BMI 06/29/2014 02/18/2014 02/02/2014   WEIGHT 188 lb 189 lb 190 lb 12.8 oz  HEIGHT 5\' 4"  5\' 4"  5\' 4"   BMI 32.25 kg/m2 32.43 kg/m2 32.73 kg/m2    Current exercise per week 60 minutes.   Hyperlipidemia Controlled, though LDL still above goal, no change in medication Hyperlipidemia:Low fat diet discussed and encouraged.   Lipid Panel  Lab Results  Component Value Date   CHOL 192 06/23/2014   HDL 72 06/23/2014   LDLCALC 105* 06/23/2014   TRIG 77 06/23/2014   CHOLHDL 2.7 06/23/2014        Obesity (BMI 30.0-34.9) Unchanged Patient re-educated about  the importance of commitment to a  minimum of 150 minutes of exercise per week.  The importance of healthy food choices with portion control discussed. Encouraged to start a food diary, count calories and to consider  joining a support group. Sample diet sheets offered. Goals set by the patient for the next several months.   Weight /BMI 06/29/2014 02/18/2014 02/02/2014  WEIGHT 188 lb 189 lb 190 lb 12.8 oz  HEIGHT 5\' 4"  5\' 4"  5\' 4"   BMI 32.25 kg/m2 32.43 kg/m2 32.73 kg/m2    Current exercise per week 90 minutes.   Metabolic syndrome X The increased risk of cardiovascular disease associated with this diagnosis, and the need to consistently work on lifestyle to change this is discussed. Following  a  heart healthy diet ,commitment to 30 minutes of exercise at least 5 days per week, as well as control of blood sugar and cholesterol , and achieving a healthy weight are all the areas to be addressed .   Need for diphtheria-tetanus-pertussis (Tdap) vaccine, adult/adolescent After obtaining informed consent, the vaccine is  administered by LPN.

## 2014-08-30 NOTE — Assessment & Plan Note (Signed)
Unchanged Patient re-educated about  the importance of commitment to a  minimum of 150 minutes of exercise per week.  The importance of healthy food choices with portion control discussed. Encouraged to start a food diary, count calories and to consider  joining a support group. Sample diet sheets offered. Goals set by the patient for the next several months.   Weight /BMI 06/29/2014 02/18/2014 02/02/2014  WEIGHT 188 lb 189 lb 190 lb 12.8 oz  HEIGHT 5\' 4"  5\' 4"  5\' 4"   BMI 32.25 kg/m2 32.43 kg/m2 32.73 kg/m2    Current exercise per week 90 minutes.

## 2014-09-17 ENCOUNTER — Other Ambulatory Visit: Payer: Self-pay | Admitting: Family Medicine

## 2014-10-05 ENCOUNTER — Other Ambulatory Visit: Payer: Self-pay | Admitting: Family Medicine

## 2014-10-14 ENCOUNTER — Other Ambulatory Visit: Payer: Self-pay | Admitting: Family Medicine

## 2014-10-14 DIAGNOSIS — Z1231 Encounter for screening mammogram for malignant neoplasm of breast: Secondary | ICD-10-CM

## 2014-10-15 ENCOUNTER — Ambulatory Visit (HOSPITAL_COMMUNITY)
Admission: RE | Admit: 2014-10-15 | Discharge: 2014-10-15 | Disposition: A | Discharge status: Home / Self Care | Payer: Medicare Other | Source: Ambulatory Visit | Attending: Family Medicine | Admitting: Family Medicine

## 2014-10-15 DIAGNOSIS — Z1231 Encounter for screening mammogram for malignant neoplasm of breast: Secondary | ICD-10-CM | POA: Diagnosis not present

## 2014-11-12 ENCOUNTER — Other Ambulatory Visit: Payer: Self-pay

## 2014-11-12 ENCOUNTER — Other Ambulatory Visit: Payer: Self-pay | Admitting: Family Medicine

## 2014-11-12 MED ORDER — NIFEDIPINE ER OSMOTIC RELEASE 60 MG PO TB24: ORAL_TABLET | ORAL | Status: DC

## 2014-11-30 DIAGNOSIS — E785 Hyperlipidemia, unspecified: Secondary | ICD-10-CM | POA: Diagnosis not present

## 2014-11-30 DIAGNOSIS — R7309 Other abnormal glucose: Secondary | ICD-10-CM | POA: Diagnosis not present

## 2014-11-30 DIAGNOSIS — I10 Essential (primary) hypertension: Secondary | ICD-10-CM | POA: Diagnosis not present

## 2014-12-01 LAB — COMPLETE METABOLIC PANEL WITH GFR
ALT: 20 U/L (ref 6–29)
AST: 20 U/L (ref 10–35)
Albumin: 4.2 g/dL (ref 3.6–5.1)
Alkaline Phosphatase: 79 U/L (ref 33–130)
BUN: 18 mg/dL (ref 7–25)
CALCIUM: 9.9 mg/dL (ref 8.6–10.4)
CHLORIDE: 102 mmol/L (ref 98–110)
CO2: 28 mmol/L (ref 20–31)
Creat: 0.83 mg/dL (ref 0.50–0.99)
GFR, EST AFRICAN AMERICAN: 84 mL/min (ref >=60)
GFR, EST NON AFRICAN AMERICAN: 73 mL/min (ref >=60)
Glucose, Bld: 79 mg/dL (ref 65–99)
POTASSIUM: 4.4 mmol/L (ref 3.5–5.3)
Sodium: 140 mmol/L (ref 135–146)
Total Bilirubin: 1 mg/dL (ref 0.2–1.2)
Total Protein: 6.6 g/dL (ref 6.1–8.1)

## 2014-12-01 LAB — LIPID PANEL
Cholesterol: 186 mg/dL (ref 125–200)
HDL: 65 mg/dL (ref >=46)
LDL Cholesterol: 109 mg/dL (ref <130)
Total CHOL/HDL Ratio: 2.9 Ratio (ref <=5.0)
Triglycerides: 60 mg/dL (ref <150)
VLDL: 12 mg/dL (ref <30)

## 2014-12-01 LAB — HEMOGLOBIN A1C
HEMOGLOBIN A1C: 6 % — AB (ref <5.7)
MEAN PLASMA GLUCOSE: 126 mg/dL — AB (ref <117)

## 2014-12-07 ENCOUNTER — Encounter: Payer: Medicare Other | Admitting: Family Medicine

## 2015-01-05 ENCOUNTER — Other Ambulatory Visit: Payer: Self-pay | Admitting: Family Medicine

## 2015-01-07 ENCOUNTER — Other Ambulatory Visit: Payer: Self-pay | Admitting: Family Medicine

## 2015-01-15 ENCOUNTER — Telehealth: Payer: Self-pay | Admitting: Family Medicine

## 2015-01-15 DIAGNOSIS — S50861A Insect bite (nonvenomous) of right forearm, initial encounter: Secondary | ICD-10-CM | POA: Diagnosis not present

## 2015-01-15 DIAGNOSIS — R21 Rash and other nonspecific skin eruption: Secondary | ICD-10-CM | POA: Diagnosis not present

## 2015-01-15 NOTE — Telephone Encounter (Signed)
nPt called around 10 pm on 10/27. C/o "spider bite" approx 24 hrs prior, with pain and redness and raised areas " in location where she thinks she was bitten. I advised urgent care eval, she was in [place] visiting her son

## 2015-01-18 ENCOUNTER — Ambulatory Visit (INDEPENDENT_AMBULATORY_CARE_PROVIDER_SITE_OTHER): Payer: Medicare Other

## 2015-01-18 DIAGNOSIS — Z23 Encounter for immunization: Secondary | ICD-10-CM | POA: Diagnosis not present

## 2015-02-03 ENCOUNTER — Telehealth: Payer: Self-pay | Admitting: Family Medicine

## 2015-02-03 MED ORDER — PSEUDOEPHEDRINE HCL 30 MG PO TABS: ORAL_TABLET | ORAL | Status: DC

## 2015-02-03 MED ORDER — PREDNISONE 5 MG PO TABS: 5.0000 mg | ORAL_TABLET | Freq: Two times a day (BID) | ORAL | Status: DC

## 2015-02-03 MED ORDER — LORATADINE 10 MG PO TABS: 10.0000 mg | ORAL_TABLET | Freq: Every day | ORAL | Status: DC

## 2015-02-03 NOTE — Telephone Encounter (Signed)
Patient is requesting an antibiotic sinus drainage, thick colored mucos, productive cough, possibly pink eye, please advise?

## 2015-02-03 NOTE — Telephone Encounter (Signed)
Patient states that she has been keeping her grand daughter who was diagnosed with croup and pink eye.  Her symptoms have been going on x 1 week.   Right eye redness started today.  Small amount of drainage noted.  She is complaining mostly of productive cough.  Please advise.    Did recommend that she start daily allergy med.

## 2015-02-03 NOTE — Telephone Encounter (Addendum)
Spoke  with patient she has no fever, symptoms consistent with uncontrolled allergies, one red splotch in her eye, sounds like due to excess cough, not pinkl eye Clariritin sudafed and pred for 5 days sent

## 2015-02-05 ENCOUNTER — Telehealth: Payer: Self-pay | Admitting: Family Medicine

## 2015-02-05 ENCOUNTER — Other Ambulatory Visit: Payer: Self-pay

## 2015-02-05 MED ORDER — CIPROFLOXACIN HCL 0.3 % OP SOLN: 1.0000 [drp] | Freq: Two times a day (BID) | OPHTHALMIC | Status: DC

## 2015-02-05 MED ORDER — AZITHROMYCIN 1 % OP SOLN: OPHTHALMIC | Status: DC

## 2015-02-05 NOTE — Telephone Encounter (Signed)
Woke up this am, right eye puffy red and crusty. Is scared to go around her family like that and wants some drops called in. Please advise

## 2015-02-05 NOTE — Telephone Encounter (Signed)
Pt aware.

## 2015-02-05 NOTE — Telephone Encounter (Signed)
Patient states that when she woke up this morning her eye was swollen shut, she is saying she believes she has pink eye and she has a new grandbaby and she does not want to go around her family like this, please advise?

## 2015-02-05 NOTE — Telephone Encounter (Signed)
I have sent in azithromycin eye drop, pls check and ensure pharmacy has this , if not , pls send me a msg as to antibiotic eye drop they have , i will send in  For pt, pls let her know if she develops pain , redness or vision loss in  Eye needs  To go to ED

## 2015-02-25 ENCOUNTER — Telehealth: Payer: Self-pay | Admitting: Family Medicine

## 2015-02-25 DIAGNOSIS — I10 Essential (primary) hypertension: Secondary | ICD-10-CM

## 2015-02-25 DIAGNOSIS — R7301 Impaired fasting glucose: Secondary | ICD-10-CM

## 2015-02-25 DIAGNOSIS — R7303 Prediabetes: Secondary | ICD-10-CM

## 2015-02-25 NOTE — Telephone Encounter (Signed)
Does she need Labs drawn before office visit on 12/20 at 9:30 please let her know

## 2015-02-25 NOTE — Telephone Encounter (Signed)
Patient aware orders will be faxed to the lab

## 2015-03-02 DIAGNOSIS — R7301 Impaired fasting glucose: Secondary | ICD-10-CM | POA: Diagnosis not present

## 2015-03-02 DIAGNOSIS — I10 Essential (primary) hypertension: Secondary | ICD-10-CM | POA: Diagnosis not present

## 2015-03-03 LAB — COMPLETE METABOLIC PANEL WITH GFR
ALBUMIN: 4.1 g/dL (ref 3.6–5.1)
ALK PHOS: 78 U/L (ref 33–130)
ALT: 26 U/L (ref 6–29)
AST: 23 U/L (ref 10–35)
BILIRUBIN TOTAL: 0.8 mg/dL (ref 0.2–1.2)
BUN: 19 mg/dL (ref 7–25)
CALCIUM: 9.8 mg/dL (ref 8.6–10.4)
CO2: 23 mmol/L (ref 20–31)
Chloride: 102 mmol/L (ref 98–110)
Creat: 0.92 mg/dL (ref 0.50–0.99)
GFR, EST NON AFRICAN AMERICAN: 65 mL/min (ref >=60)
GFR, Est African American: 75 mL/min (ref >=60)
GLUCOSE: 72 mg/dL (ref 65–99)
Potassium: 4.1 mmol/L (ref 3.5–5.3)
SODIUM: 138 mmol/L (ref 135–146)
TOTAL PROTEIN: 7 g/dL (ref 6.1–8.1)

## 2015-03-03 LAB — HEMOGLOBIN A1C
HEMOGLOBIN A1C: 6 % — AB (ref <5.7)
MEAN PLASMA GLUCOSE: 126 mg/dL — AB (ref <117)

## 2015-03-09 ENCOUNTER — Ambulatory Visit (INDEPENDENT_AMBULATORY_CARE_PROVIDER_SITE_OTHER): Payer: Medicare Other | Admitting: Family Medicine

## 2015-03-09 ENCOUNTER — Encounter: Payer: Self-pay | Admitting: Family Medicine

## 2015-03-09 VITALS — BP 124/82 | HR 89 | Resp 16 | Ht 64.0 in | Wt 188.4 lb

## 2015-03-09 DIAGNOSIS — R7301 Impaired fasting glucose: Secondary | ICD-10-CM

## 2015-03-09 DIAGNOSIS — E785 Hyperlipidemia, unspecified: Secondary | ICD-10-CM

## 2015-03-09 DIAGNOSIS — R7303 Prediabetes: Secondary | ICD-10-CM

## 2015-03-09 DIAGNOSIS — Z0000 Encounter for general adult medical examination without abnormal findings: Secondary | ICD-10-CM

## 2015-03-09 DIAGNOSIS — I10 Essential (primary) hypertension: Secondary | ICD-10-CM

## 2015-03-09 NOTE — Progress Notes (Signed)
Subjective:    Patient ID: Christina Carroll, female    DOB: 01-Jun-1945, 69 y.o.   MRN: [id number]  HPI Preventive Screening-Counseling & Management   Patient present here today for a Medicare annual wellness visit.   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 Never been a smoker or used drugs    Risk Factors  Current exercise habits:  Very little at this time. Wants to start walking in January   Dietary issues discussed: Heart healthy diet discussed, eating more fruits and vegetables and limiting fried fatty foods   Cardiac risk factors: HTN   Depression Screen  (Note: if answer to either of the following is "Yes", a more complete depression screening is indicated)   Over the past two weeks, have you felt down, depressed or hopeless? No  Over the past two weeks, have you felt little interest or pleasure in doing things? No  Have you lost interest or pleasure in daily life? No  Do you often feel hopeless? No  Do you cry easily over simple problems? No   Activities of Daily Living  In your present state of health, do you have any difficulty performing the following activities?  Driving?: No Managing money?: No Feeding yourself?:No Getting from bed to chair?:No Climbing a flight of stairs?:No Preparing food and eating?:No Bathing or showering?:No Getting dressed?:No Getting to the toilet?:No Using the toilet?:No Moving around from place to place?: No  Fall Risk Assessment In the past year have you fallen or had a near fall?:No Are you currently taking any medications that make you dizzy?:No   Hearing Difficulties: No Do you often ask people to speak up or repeat themselves?:No Do you experience ringing or noises in your ears?:No Do you have difficulty understanding soft or whispered voices?: sometimes    Cognitive Testing  Alert? Yes Normal Appearance?Yes  Oriented to person? Yes Place? Yes  Time? Yes  Displays appropriate judgment?Yes  Can read the correct time from a watch face? yes Are you having problems remembering things?No  Advanced Directives have been discussed with the patient?Yes, full code, brochure given  , no living will   List the Names of Other Physician/Practitioners you currently use:  Dr Bary Leriche (ENT)    Indicate any recent Medical Services you may have received from other than [facility] providers in the past year (date may be approximate).   Assessment:    Annual Wellness Exam   Plan:    During the course of the visit the patient was educated and counseled about appropriate screening and preventive services including:  A healthy diet is rich in fruit, vegetables and whole grains. Poultry fish, nuts and beans are a healthy choice for protein rather then red meat. A low sodium diet and drinking 64 ounces of water daily is generally recommended. Oils and sweet should be limited. Carbohydrates especially for those who are diabetic or overweight, should be limited to 30-45 gram per meal. It is important to eat on a regular schedule, at least 3 times daily. Snacks should be primarily fruits, vegetables or nuts. It is important that you exercise regularly at least 30 minutes 5 times a week. If you develop chest pain, have severe difficulty breathing, or feel very tired, stop exercising immediately and seek medical attention  Immunization reviewed and updated. Cancer screening reviewed and updated    Patient  Instructions (the written plan) was given to the patient.  Medicare Attestation  I have personally reviewed:  The patient's medical and social history  Their use of alcohol, tobacco or illicit drugs  Their current medications and supplements  The patient's functional ability including ADLs,fall risks, home safety risks, cognitive, and hearing and visual impairment   Diet and physical activities  Evidence for depression or mood disorders  The patient's weight, height, BMI, and visual acuity have been recorded in the chart. I have made referrals, counseling, and provided education to the patient based on review of the above and I have provided the patient with a written personalized care plan for preventive services.      Review of Systems     Objective:   Physical Exam BP 124/82 mmHg  Pulse 89  Resp 16  Ht 5\' 4"  (1.626 m)  Wt 188 lb 6.4 oz (85.458 kg)  BMI 32.32 kg/m2  SpO2 97%        Assessment & Plan:  Medicare annual wellness visit, subsequent Annual exam as documented. Counseling done  re healthy lifestyle involving commitment to 150 minutes exercise per week, heart healthy diet, and attaining healthy weight.The importance of adequate sleep also discussed. Regular seat belt use and home safety, is also discussed. Changes in health habits are decided on by the patient with goals and time frames  set for achieving them. Immunization and cancer screening needs are specifically addressed at this visit.

## 2015-03-09 NOTE — Patient Instructions (Addendum)
Annual physical in 5.5 month, call if you need me before  Commit to healthier eating and regular exercise  No changes in medication  Please work on living will   Thanks for choosing [facility], we consider it a privelige to serve you.  All the best for 2017!  HBA1C, fasting lipid, cmp and EGFR, and CBC in 5 month      

## 2015-03-15 ENCOUNTER — Encounter: Payer: Self-pay | Admitting: Family Medicine

## 2015-03-15 DIAGNOSIS — Z0000 Encounter for general adult medical examination without abnormal findings: Secondary | ICD-10-CM | POA: Insufficient documentation

## 2015-03-15 NOTE — Assessment & Plan Note (Signed)

## 2015-03-23 ENCOUNTER — Ambulatory Visit (INDEPENDENT_AMBULATORY_CARE_PROVIDER_SITE_OTHER): Payer: Medicare Other | Admitting: Family Medicine

## 2015-03-23 ENCOUNTER — Encounter: Payer: Self-pay | Admitting: Family Medicine

## 2015-03-23 VITALS — BP 128/74 | HR 80 | Temp 98.6°F | Resp 16 | Ht 64.0 in | Wt 185.0 lb

## 2015-03-23 DIAGNOSIS — J209 Acute bronchitis, unspecified: Secondary | ICD-10-CM | POA: Diagnosis not present

## 2015-03-23 DIAGNOSIS — J0100 Acute maxillary sinusitis, unspecified: Secondary | ICD-10-CM

## 2015-03-23 DIAGNOSIS — J019 Acute sinusitis, unspecified: Secondary | ICD-10-CM | POA: Insufficient documentation

## 2015-03-23 DIAGNOSIS — J302 Other seasonal allergic rhinitis: Secondary | ICD-10-CM

## 2015-03-23 DIAGNOSIS — I10 Essential (primary) hypertension: Secondary | ICD-10-CM | POA: Diagnosis not present

## 2015-03-23 MED ORDER — BENZONATATE 100 MG PO CAPS: 100.0000 mg | ORAL_CAPSULE | Freq: Two times a day (BID) | ORAL | Status: DC | PRN

## 2015-03-23 MED ORDER — PROMETHAZINE-DM 6.25-15 MG/5ML PO SYRP: ORAL_SOLUTION | ORAL | Status: DC

## 2015-03-23 MED ORDER — AZITHROMYCIN 250 MG PO TABS: ORAL_TABLET | ORAL | Status: DC

## 2015-03-23 MED ORDER — CEFTRIAXONE SODIUM 500 MG IJ SOLR
500.0000 mg | Freq: Once | INTRAMUSCULAR | Status: AC
  Administered 2015-03-23: 500 mg via INTRAMUSCULAR

## 2015-03-23 MED ORDER — METHYLPREDNISOLONE ACETATE 80 MG/ML IJ SUSP
80.0000 mg | Freq: Once | INTRAMUSCULAR | Status: AC
  Administered 2015-03-23: 80 mg via INTRAMUSCULAR

## 2015-03-23 NOTE — Patient Instructions (Signed)
F/u as before, call if you need me sooner.  Rocephin and depo medrol in office today  You are treated for acute sinusitis and bronchitis  Three meds are prescribed as discussed.  You NEED to commit to daily claritin which is recommended, and available as loratidine OTC for $4  per 30 day supply, continue daily flonase and add netty pot saline flushes to help sinuses

## 2015-03-23 NOTE — Assessment & Plan Note (Signed)
Rocephin in office and z pack

## 2015-03-23 NOTE — Assessment & Plan Note (Signed)
Worsening symptoms over a 10 day period, decongestant and cough suppressant

## 2015-03-23 NOTE — Progress Notes (Signed)
   Subjective:    Patient ID: Christina Carroll, female    DOB: [birth year], 70 y.o.   MRN: [id number]  HPI 1 week h/o worsening head and chest congestion, associated with fever and chills intermittently. Nasal drainage has thickened , and is yellowish green, and at times bloody. Sputum is thick and yellow. C/o bilateral ear pressure, denies hearing loss and sore throat. Increasing fatigue , poor appetitie and sleep disturbed by cough. No improvement with OTC medication.    Review of Systems See HPI  Denies chest pains, palpitations and leg swelling Denies abdominal pain, nausea, vomiting,diarrhea or constipation.   Denies dysuria, frequency, hesitancy or incontinence. Denies joint pain, swelling and limitation in mobility. Denies headaches, seizures, numbness, or tingling. Denies depression, anxiety or insomnia. Denies skin break down or rash.        Objective:   Physical Exam  BP 128/74 mmHg  Pulse 80  Temp(Src) 98.6 F (37 C) (Oral)  Resp 16  Ht 5\' 4"  (1.626 m)  Wt 185 lb (83.915 kg)  BMI 31.74 kg/m2  SpO2 99% Patient alert and oriented and in no cardiopulmonary distress.Ill appearing  HEENT: No facial asymmetry, EOMI,   oropharynx pink and moist.  Neck bilateral anterior cervical adenitis., bilateral maxillary sinus tenderness, TM dull bilaterally  Chest: decreased thoughadequate air entry,  Scattered crackles, no wheezes  CVS: S1, S2 no murmurs, no S3.Regular rate.  ABD: Soft non tender.   Ext: No edema  MS: Adequate ROM spine, shoulders, hips and knees.  Skin: Intact, no ulcerations or rash noted.  Psych: Good eye contact, normal affect. Memory intact not anxious or depressed appearing.  CNS: CN 2-12 intact, power,  normal throughout.no focal deficits noted.       Assessment & Plan:  Acute maxillary sinusitis Rocephin in office and z pack  Acute bronchitis Worsening symptoms over a 10 day period, decongestant and cough  suppressant  Seasonal allergies Uncontrolled, needs to commit to daily allergy meds, and add netty pot  also  Essential hypertension Controlled, no change in medication

## 2015-03-24 NOTE — Assessment & Plan Note (Signed)
Uncontrolled, needs to commit to daily allergy meds, and add netty pot  also

## 2015-03-24 NOTE — Assessment & Plan Note (Signed)
Controlled, no change in medication  

## 2015-04-12 ENCOUNTER — Other Ambulatory Visit: Payer: Self-pay | Admitting: Family Medicine

## 2015-04-20 ENCOUNTER — Ambulatory Visit (HOSPITAL_COMMUNITY)
Admission: RE | Admit: 2015-04-20 | Discharge: 2015-04-20 | Disposition: A | Discharge status: Home / Self Care | Payer: Medicare Other | Source: Ambulatory Visit | Attending: Family Medicine | Admitting: Family Medicine

## 2015-04-20 ENCOUNTER — Encounter: Payer: Self-pay | Admitting: Family Medicine

## 2015-04-20 ENCOUNTER — Ambulatory Visit (INDEPENDENT_AMBULATORY_CARE_PROVIDER_SITE_OTHER): Payer: Medicare Other | Admitting: Family Medicine

## 2015-04-20 VITALS — BP 130/80 | HR 90 | Temp 98.4°F | Resp 16 | Ht 64.0 in | Wt 188.0 lb

## 2015-04-20 DIAGNOSIS — R058 Other specified cough: Secondary | ICD-10-CM

## 2015-04-20 DIAGNOSIS — R05 Cough: Secondary | ICD-10-CM | POA: Diagnosis not present

## 2015-04-20 DIAGNOSIS — I10 Essential (primary) hypertension: Secondary | ICD-10-CM

## 2015-04-20 MED ORDER — PREDNISONE 5 MG (21) PO TBPK: 5.0000 mg | ORAL_TABLET | ORAL | Status: DC

## 2015-04-20 MED ORDER — MONTELUKAST SODIUM 10 MG PO TABS: 10.0000 mg | ORAL_TABLET | Freq: Every day | ORAL | Status: DC

## 2015-04-20 NOTE — Progress Notes (Signed)
   Subjective:    Patient ID: Christina Carroll, female    DOB: [birth year], 69 y.o.   MRN: [id number]  HPI 1 week h/o cough, producing yellowish sputum at times,no fever or chills. Positive head  Congestion and hoarseness, has been unable to sing since November. Has been treated with antibiotic   Review of Systems See HPI Denies chest pains, palpitations and leg swelling Denies abdominal pain, nausea, vomiting,diarrhea or constipation.   Denies dysuria, frequency, hesitancy or incontinence. Denies joint pain, swelling and limitation in mobility. Denies headaches, seizures, numbness, or tingling. Denies depression, anxiety or insomnia. Denies skin break down or rash.        Objective:   Physical Exam  BP 130/80 mmHg  Pulse 90  Temp(Src) 98.4 F (36.9 C) (Oral)  Resp 16  Ht 5\' 4"  (1.626 m)  Wt 188 lb (85.276 kg)  BMI 32.25 kg/m2  SpO2 98% Patient alert and oriented and in no cardiopulmonary distress.  HEENT: No facial asymmetry, EOMI,   oropharynx pink and moist.  Neck supple no JVD, no mass. Nasal mucosa erythematous and edematous, no sinus tenderness, TM clear bilaterally Chest: Clear to auscultation bilaterally.  CVS: S1, S2 no murmurs, no S3.Regular rate.  ABD: Soft non tender.   Ext: No edema  MS: Adequate ROM spine, shoulders, hips and knees.  Skin: Intact, no ulcerations or rash noted.       Assessment & Plan:  Allergic cough Ongoing cough since past 6 to 8 weeks, no fevr or chills but sinus congestion. Data to be obtained include CXR and sputum culture, she received antibiotic course earlier this month. Will treat more aggressively as allergic based cough. Pt to call back if no resolution  Essential hypertension Controlled, no change in medication DASH diet and commitment to daily physical activity for a minimum of 30 minutes discussed and encouraged, as a part of hypertension management. The importance of attaining a healthy weight is also  discussed.  BP/Weight 04/20/2015 03/23/2015 03/09/2015 06/29/2014 02/18/2014 02/02/2014 [id number]  Systolic BP [id number] 123456 0000000 [id number] 0000000  Diastolic BP 80 74 82 82 [id number] 80 86  Wt. (Lbs) 188 185 188.4 188 189 190.8 192  BMI 32.25 31.74 32.32 32.25 32.43 32.73 32.94

## 2015-04-20 NOTE — Patient Instructions (Signed)
F/u  in June , call if me need me before  CXR today, also sputum to be sent to lab for culture as soon as be possible  Prednisone and singulair sent for cough which I believe to be allergy based now  Thanks for choosing [facility], we consider it a privelige to serve you.

## 2015-04-21 DIAGNOSIS — R05 Cough: Secondary | ICD-10-CM | POA: Diagnosis not present

## 2015-04-24 LAB — RESPIRATORY CULTURE OR RESPIRATORY AND SPUTUM CULTURE: ORGANISM ID, BACTERIA: NORMAL

## 2015-04-30 ENCOUNTER — Other Ambulatory Visit: Payer: Self-pay

## 2015-04-30 ENCOUNTER — Other Ambulatory Visit: Payer: Self-pay | Admitting: Family Medicine

## 2015-04-30 ENCOUNTER — Telehealth: Payer: Self-pay | Admitting: Family Medicine

## 2015-04-30 DIAGNOSIS — J328 Other chronic sinusitis: Secondary | ICD-10-CM

## 2015-04-30 MED ORDER — LEVOFLOXACIN 500 MG PO TABS: 500.0000 mg | ORAL_TABLET | Freq: Every day | ORAL | Status: DC

## 2015-04-30 MED ORDER — TRIAMTERENE-HCTZ 37.5-25 MG PO TABS: 1.0000 | ORAL_TABLET | Freq: Every day | ORAL | Status: DC

## 2015-04-30 NOTE — Assessment & Plan Note (Signed)
Controlled, no change in medication DASH diet and commitment to daily physical activity for a minimum of 30 minutes discussed and encouraged, as a part of hypertension management. The importance of attaining a healthy weight is also discussed.  BP/Weight 04/20/2015 03/23/2015 03/09/2015 06/29/2014 02/18/2014 02/02/2014 [id number]  Systolic BP [id number] 123456 0000000 [id number] 0000000  Diastolic BP 80 74 82 82 [id number] 80 86  Wt. (Lbs) 188 185 188.4 188 189 190.8 192  BMI 32.25 31.74 32.32 32.25 32.43 32.73 32.94

## 2015-04-30 NOTE — Telephone Encounter (Signed)
Direct call, made to pt with regard to her ongoing symptoms.  States she has been hoarse for 10 to 12 weeks, with discolored post nasal drainage. No definite fever or chills reported  Unable to sing and  Has a cough. Recent CXR unrevealing  I have discontinued lisinopril as this may be contributing to cough, new is maxzide in its place which she requests be sent to her mail order pharmacy, pls verify and send  Needs to resubmit discolored mucus.Pls provide her with order and cup  I am ordering a scan of her sinuses  I am starting her on levaquin which is sent to local pharmacy  Needs 4 to 5  week follow up appt with me also  Pls ensure she understands the entire plan

## 2015-04-30 NOTE — Assessment & Plan Note (Signed)
Ongoing cough since past 6 to 8 weeks, no fevr or chills but sinus congestion. Data to be obtained include CXR and sputum culture, she received antibiotic course earlier this month. Will treat more aggressively as allergic based cough. Pt to call back if no resolution

## 2015-04-30 NOTE — Telephone Encounter (Signed)
Pls let pt know that I will have to wait on the CT scan of her sinus, since she has only had one antibiotic course since symptoms started If she still has symptoms on completion of this new course then the scan would be indicated. Still needs to try to submit drainage IF ABLE!

## 2015-04-30 NOTE — Telephone Encounter (Signed)
Pt aware.

## 2015-05-03 NOTE — Telephone Encounter (Signed)
Patient will call Monday if she is still producing colored phlegm. Scan of sinuses was denied. Will take entire course of levaquin and if still no better, scan will then be indicated. Patient aware

## 2015-05-12 ENCOUNTER — Other Ambulatory Visit: Payer: Self-pay | Admitting: Family Medicine

## 2015-05-14 ENCOUNTER — Telehealth: Payer: Self-pay | Admitting: Family Medicine

## 2015-05-14 NOTE — Telephone Encounter (Signed)
Patient is asking for a returned call from the nurse in regards to a letter that she has received from her insurance company

## 2015-05-17 ENCOUNTER — Other Ambulatory Visit: Payer: Self-pay

## 2015-05-17 MED ORDER — NIFEDIPINE ER OSMOTIC RELEASE 60 MG PO TB24: ORAL_TABLET | ORAL | Status: DC

## 2015-05-17 NOTE — Telephone Encounter (Signed)
Spoke with patient and she was concerned that insurance did not cover the CT of sinuses that was ordered.  Aware that this will not be pursued unless she starts to have sinus problems again.

## 2015-05-21 ENCOUNTER — Other Ambulatory Visit: Payer: Self-pay | Admitting: Family Medicine

## 2015-05-24 DIAGNOSIS — E063 Autoimmune thyroiditis: Secondary | ICD-10-CM | POA: Diagnosis not present

## 2015-05-24 DIAGNOSIS — E049 Nontoxic goiter, unspecified: Secondary | ICD-10-CM | POA: Diagnosis not present

## 2015-05-31 DIAGNOSIS — H0520 Unspecified exophthalmos: Secondary | ICD-10-CM | POA: Diagnosis not present

## 2015-05-31 DIAGNOSIS — I10 Essential (primary) hypertension: Secondary | ICD-10-CM | POA: Diagnosis not present

## 2015-05-31 DIAGNOSIS — E0500 Thyrotoxicosis with diffuse goiter without thyrotoxic crisis or storm: Secondary | ICD-10-CM | POA: Diagnosis not present

## 2015-05-31 DIAGNOSIS — E785 Hyperlipidemia, unspecified: Secondary | ICD-10-CM | POA: Diagnosis not present

## 2015-05-31 DIAGNOSIS — E049 Nontoxic goiter, unspecified: Secondary | ICD-10-CM | POA: Diagnosis not present

## 2015-05-31 DIAGNOSIS — E063 Autoimmune thyroiditis: Secondary | ICD-10-CM | POA: Diagnosis not present

## 2015-07-21 ENCOUNTER — Other Ambulatory Visit: Payer: Self-pay | Admitting: Family Medicine

## 2015-08-09 ENCOUNTER — Encounter: Payer: Self-pay | Admitting: Family Medicine

## 2015-08-10 ENCOUNTER — Emergency Department (HOSPITAL_COMMUNITY)
Admission: EM | Admit: 2015-08-10 | Discharge: 2015-08-10 | Disposition: A | Discharge status: Home / Self Care | Payer: Medicare Other | Attending: Emergency Medicine | Admitting: Emergency Medicine

## 2015-08-10 ENCOUNTER — Encounter (HOSPITAL_COMMUNITY): Payer: Self-pay

## 2015-08-10 DIAGNOSIS — Z79899 Other long term (current) drug therapy: Secondary | ICD-10-CM | POA: Insufficient documentation

## 2015-08-10 DIAGNOSIS — R42 Dizziness and giddiness: Secondary | ICD-10-CM | POA: Diagnosis not present

## 2015-08-10 DIAGNOSIS — S0083XA Contusion of other part of head, initial encounter: Secondary | ICD-10-CM | POA: Diagnosis not present

## 2015-08-10 DIAGNOSIS — Y929 Unspecified place or not applicable: Secondary | ICD-10-CM | POA: Diagnosis not present

## 2015-08-10 DIAGNOSIS — Y939 Activity, unspecified: Secondary | ICD-10-CM | POA: Insufficient documentation

## 2015-08-10 DIAGNOSIS — W1839XA Other fall on same level, initial encounter: Secondary | ICD-10-CM | POA: Diagnosis not present

## 2015-08-10 DIAGNOSIS — S01511A Laceration without foreign body of lip, initial encounter: Secondary | ICD-10-CM | POA: Diagnosis not present

## 2015-08-10 DIAGNOSIS — E785 Hyperlipidemia, unspecified: Secondary | ICD-10-CM | POA: Diagnosis not present

## 2015-08-10 DIAGNOSIS — I10 Essential (primary) hypertension: Secondary | ICD-10-CM | POA: Insufficient documentation

## 2015-08-10 DIAGNOSIS — Z791 Long term (current) use of non-steroidal anti-inflammatories (NSAID): Secondary | ICD-10-CM | POA: Diagnosis not present

## 2015-08-10 DIAGNOSIS — Y999 Unspecified external cause status: Secondary | ICD-10-CM | POA: Diagnosis not present

## 2015-08-10 MED ORDER — MAGNESIUM SULFATE 2 GM/50ML IV SOLN: 2.0000 g | Freq: Once | INTRAVENOUS | Status: DC

## 2015-08-10 NOTE — ED Notes (Signed)
Pt's left eye red.  Pt says it was like that before she fell.

## 2015-08-10 NOTE — Discharge Instructions (Signed)

## 2015-08-10 NOTE — ED Notes (Signed)
Pt reports was taking her trash to the road this morning and stepped on an uneven place in her driveway and fell.  Reports head on pavement.  Pt has abrasion to left side of forehead and has laceration to upper lip.  Denies any LOC but says was dizzy immediately after falling so she laid in the street for a few min until the dizziness passed.

## 2015-08-10 NOTE — ED Provider Notes (Signed)
CSN: [id number]     Arrival date & time 08/10/15  [id number] History   First MD Initiated Contact with Patient 08/10/15 1059     Chief Complaint  Patient presents with  . Fall     (Consider location/radiation/quality/duration/timing/severity/associated sxs/prior Treatment) HPI   Patient is a 70 year old female with a history of HTN who presents to the ED after a fall that occurred at roughly 10 AM today. Patient states she was getting her trashcan and stepped unevenly on her driveway and fell onto the left side of her face. She denies LOC. She denies dizziness, syncope. She states it took her a few minutes to gather herself, she had mild dizziness but in a few moments it resolved. She is complaining of a laceration to her upper left lip and abrasion to her left forehead. She denies headache, nausea, vomiting, numbness/weakness, visual disturbance, chest pain, shortness of breath.  Past Medical History  Diagnosis Date  . Obesity   . Hyperlipidemia   . Essential hypertension   . Arthritis   . Lymphocytic thyroiditis   . Prediabetes    Past Surgical History  Procedure Laterality Date  . Lumpectomy removed from thoracic spine area  1999  . Bilateral foot surgery bone removed  2000  . Colonoscopy  11/30/2011    Procedure: COLONOSCOPY;  Surgeon: Rogene Houston, MD;  Location: [facility];  Service: Endoscopy;  Laterality: N/A;  64   Family History  Problem Relation Age of Onset  . Stroke Mother   . Diabetes Mother   . Hypertension Mother   . Coronary artery disease Father   . Arthritis    . Cancer    . Breast cancer Maternal Aunt    Social History  Substance Use Topics  . Smoking status: Never Smoker   . Smokeless tobacco: Never Used  . Alcohol Use: No   OB History    No data available     Review of Systems  Eyes: Negative for visual disturbance.  Respiratory: Negative for chest tightness and shortness of breath.   Cardiovascular: Negative for chest pain.   Gastrointestinal: Negative for nausea, vomiting and abdominal pain.  Musculoskeletal: Negative for back pain and neck pain.  Neurological: Negative for syncope, weakness, light-headedness and numbness.  Psychiatric/Behavioral: Negative for confusion.      Allergies  Review of patient's allergies indicates no known allergies.  Home Medications   Prior to Admission medications   Medication Sig Start Date End Date Taking? Authorizing Provider  aspirin (ASPIRIN LOW DOSE) 81 MG EC tablet Take 81 mg by mouth daily.    Yes Historical Provider, MD  calcium-vitamin D (OSCAL WITH D) 500-200 MG-UNIT TABS tablet TAKE (1) TABLET BY MOUTH (3) TIMES DAILY. 05/14/15  Yes Fayrene Helper, MD  fluticasone (FLONASE) 50 MCG/ACT nasal spray Place 2 sprays into both nostrils daily. For allergies 07/16/14 09/30/19 Yes Fayrene Helper, MD  NIFEdipine (PROCARDIA XL/ADALAT-CC) 60 MG 24 hr tablet Take 1 tablet by mouth  daily 05/24/15  Yes Fayrene Helper, MD  pravastatin (PRAVACHOL) 80 MG tablet Take 1 tablet by mouth  daily 07/22/15  Yes Fayrene Helper, MD  triamterene-hydrochlorothiazide (MAXZIDE-25) 37.5-25 MG tablet Take 1 tablet by mouth daily. 04/30/15  Yes Fayrene Helper, MD  levofloxacin (LEVAQUIN) 500 MG tablet Take 1 tablet (500 mg total) by mouth daily. Patient not taking: Reported on 08/10/2015 04/30/15   Fayrene Helper, MD  montelukast (SINGULAIR) 10 MG tablet Take 1 tablet (10 mg total) by  mouth at bedtime. Patient not taking: Reported on 08/10/2015 04/20/15   Fayrene Helper, MD  predniSONE (STERAPRED UNI-PAK 21 TAB) 5 MG (21) TBPK tablet Take 1 tablet (5 mg total) by mouth as directed. Use as directed Patient not taking: Reported on 08/10/2015 04/20/15   Fayrene Helper, MD   BP 138/72 mmHg  Pulse 78  Temp(Src) 98 F (36.7 C) (Oral)  Resp 18  Ht 5\' 4"  (1.626 m)  Wt 86.183 kg  BMI 32.60 kg/m2  SpO2 100% Physical Exam  Physical Exam  Constitutional: Pt is oriented to  person, place, and time. Pt appears well-developed and well-nourished. No distress.  HENT:  Head: Abrasion noted to left forehead, no edema or hematoma, no TTP of the face or head except around the abrasion. Mouth/Throat: Oropharynx is clear and moist, 1cm laceration noted to inner upper left lip, no dental abnormalities.  Eyes: Conjunctivae and EOM are normal. Pupils are equal, round, and reactive to light. No scleral icterus.  No horizontal, vertical or rotational nystagmus  Neck: Normal range of motion. Neck supple.  Full active and passive ROM without pain No midline or paraspinal tenderness Cardiovascular: Normal rate, regular rhythm and intact distal pulses.   Pulmonary/Chest: Effort normal and breath sounds normal. No respiratory distress. Pt has no wheezes. No rales.  Abdominal: Soft.  There is no tenderness. There is no rebound and no guarding.  Musculoskeletal: Normal range of motion, no edema noted.  Neurological: Pt. is alert and oriented to person, place, and time.  No cranial nerve deficit.  Exhibits normal muscle tone. Coordination normal.  Mental Status:  Alert, oriented, thought content appropriate. Speech fluent without evidence of aphasia. Able to follow 2 step commands without difficulty.  Cranial Nerves:  II:  Peripheral visual fields grossly normal, pupils equal, round, reactive to light III,IV, VI: ptosis not present, extra-ocular motions intact bilaterally  V,VII: smile symmetric, facial light touch sensation equal VIII: hearing grossly normal bilaterally  IX,X: midline uvula rise  XI: bilateral shoulder shrug equal and strong XII: midline tongue extension  Motor:  5/5 in upper and lower extremities bilaterally including strong and equal grip strength and dorsiflexion/plantar flexion Sensory: light touch normal in all extremities.  Cerebellar: normal finger-to-nose with bilateral upper extremities, pronator drift negative Gait: normal gait and balance CV: distal  pulses palpable throughout   Skin: Skin is warm and dry. No rash noted. Pt is not diaphoretic.  Psychiatric: Pt has a normal mood and affect. Behavior is normal. Judgment and thought content normal.  Nursing note and vitals reviewed.    ED Course  Procedures (including critical care time) Labs Review Labs Reviewed - No data to display  Imaging Review No results found. I have personally reviewed and evaluated these images and lab results as part of my medical decision-making.   EKG Interpretation None      MDM   Final diagnoses:  Facial contusion, initial encounter  Lip laceration, initial encounter   Patient presents after fall. No evidence or signs of skull fracture or facial bone fracture. No contusions or hematomas noted. Canadian head CT rules reviewed by me. I discussed the option of the CT with patient and she does not want one. No focal neurological deficits on exam, no nausea, loss of consciousness, vomiting, less likely there is an intracranial pathology. I discussed strict return precautions to include visual changes, nausea, vomiting, weakness, numbness.. I instructed her to use ice and over-the-counter pain medications for pain relief. I instructed her to  follow-up with her primary care physician. She expressed understanding to the discharge instructions.    Kalman Drape, PA 08/10/15 1535  Dorie Rank, MD 08/11/15 1055

## 2015-08-10 NOTE — ED Provider Notes (Signed)
Patient was seen in the emergency room after a fall. She was taking out the trash and stepped on an even Place in her driveway. She stumbled and fell hitting her head and face against the ground. Patient denied any loss of consciousness. She did feel little bit dizzy when she first fell but after few minutes of lying still the symptoms resolved. She currently is not having any trouble with dizziness. She denies any headache. She denies any nausea or vomiting. No numbness or weakness no visual disturbance. No jaw malocclusion Physical Exam  BP 139/73 mmHg  Pulse 80  Temp(Src) 98 F (36.7 C) (Oral)  Resp 20  Ht 5\' 4"  (1.626 m)  Wt 86.183 kg  BMI 32.60 kg/m2  SpO2 100%  Physical Exam  Constitutional: She appears well-developed and well-nourished. No distress.  HENT:  Head: Normocephalic.  Right Ear: External ear normal.  Left Ear: External ear normal.  Abrasions noted on the forehead, approximately half centimeter mucosal lip laceration of the upper lip, no dental malocclusion, no facial tenderness or edema other than immediately over the abrasion on the left forehead  Eyes: Conjunctivae are normal. Right eye exhibits no discharge. Left eye exhibits no discharge. No scleral icterus.  Neck: Neck supple. No tracheal deviation present.  Cardiovascular: Normal rate.   Pulmonary/Chest: Effort normal. No stridor. No respiratory distress.  Musculoskeletal: She exhibits no edema.  Neurological: She is alert. Cranial nerve deficit: no gross deficits.  Skin: Skin is warm and dry. No rash noted.  Psychiatric: She has a normal mood and affect.  Nursing note and vitals reviewed.   ED Course  Procedures  MDM Patient does not have evidence of skull fracture or facial bone fracture clinically. [place] head CT rules reviewed. I discussed the option of doing a head CT with the patient versus observation. She does not want any imaging done at this point. Warning signs and precautions were discussed.  Patient's tetanus shot was less than 10 years. She will be given an ice pack to help with the swelling. Apply antibiotic ointment to the abrasions     Dorie Rank, MD 08/10/15 1229

## 2015-08-17 ENCOUNTER — Ambulatory Visit (INDEPENDENT_AMBULATORY_CARE_PROVIDER_SITE_OTHER): Payer: Medicare Other | Admitting: Family Medicine

## 2015-08-17 ENCOUNTER — Encounter: Payer: Self-pay | Admitting: Family Medicine

## 2015-08-17 VITALS — BP 130/82 | HR 81 | Resp 16 | Ht 64.0 in | Wt 193.8 lb

## 2015-08-17 DIAGNOSIS — Z09 Encounter for follow-up examination after completed treatment for conditions other than malignant neoplasm: Secondary | ICD-10-CM | POA: Insufficient documentation

## 2015-08-17 DIAGNOSIS — I10 Essential (primary) hypertension: Secondary | ICD-10-CM | POA: Diagnosis not present

## 2015-08-17 DIAGNOSIS — E669 Obesity, unspecified: Secondary | ICD-10-CM

## 2015-08-17 DIAGNOSIS — W101XXD Fall (on)(from) sidewalk curb, subsequent encounter: Secondary | ICD-10-CM

## 2015-08-17 DIAGNOSIS — E785 Hyperlipidemia, unspecified: Secondary | ICD-10-CM

## 2015-08-17 NOTE — Progress Notes (Signed)
Subjective:    Patient ID: Christina Carroll, female    DOB: 1945-04-21, 70 y.o.   MRN: [id number]  HPI Pt in for eD follow up fell on 08/10/2015, hit face , left arm and chest, healing well, but upper lip swollen and chapped, sore area on left breast, denies any memory  loss, and headache following trauma Two days later, moving quickly on flat surface she nearly fell again , footwear is safe and appropriate by history, closed and secure   Review of Systems See HPI Denies recent fever or chills. Denies sinus pressure, nasal congestion, ear pain or sore throat. Denies chest congestion, productive cough or wheezing. Denies chest pains, palpitations and leg swelling Denies abdominal pain, nausea, vomiting,diarrhea or constipation.   Denies dysuria, frequency, hesitancy or incontinence. Denies joint pain, swelling and limitation in mobility. Denies headaches, seizures, numbness, or tingling. Denies depression, anxiety or insomnia.       Objective:   Physical Exam BP 130/82 mmHg  Pulse 81  Resp 16  Ht 5\' 4"  (1.626 m)  Wt 193 lb 12.8 oz (87.907 kg)  BMI 33.25 kg/m2  SpO2 97% Patient alert and oriented and in no cardiopulmonary distress.  HEENT: No facial asymmetry, EOMI,   oropharynx pink and moist.  Neck supple no JVD, no mass. Scar on left forehead fully healed. Slightly swollen left upper lip, laceration fully healed Chest: Clear to auscultation bilaterally.  CVS: S1, S2 no murmurs, no S3.Regular rate. Breast: no tenderness, bruising or palpable lump on examination of both breasts, also no lymphadenopathy ABD: Soft non tender.   Ext: No edema  MS: Adequate ROM spine, shoulders, hips and knees.  Skin: Intact, no ulcerations or rash noted.  Psych: Good eye contact, normal affect. Memory intact not anxious or depressed appearing.  CNS: CN 2-12 intact, power,  normal throughout.no focal deficits noted.       Assessment & Plan:  Encounter for examination  following treatment at hospital Symptom improvement, Lip laceration healed , however assymetric. Forehead laceration healed and denmies memory disturnbance or new headache Breast exam negative for bruise or lump  Essential hypertension Controlled, no change in medication DASH diet and commitment to daily physical activity for a minimum of 30 minutes discussed and encouraged, as a part of hypertension management. The importance of attaining a healthy weight is also discussed.  BP/Weight 08/17/2015 08/10/2015 04/20/2015 03/23/2015 03/09/2015 06/29/2014 99991111  Systolic BP [id number] 0000000 [id number] 0000000 [id number] 0000000  Diastolic BP 82 72 80 74 82 82 102  Wt. (Lbs) 193.8 190 188 185 188.4 188 189  BMI 33.25 32.6 32.25 31.74 32.32 32.25 32.43        Obesity Deteriorated. Patient re-educated about  the importance of commitment to a  minimum of 150 minutes of exercise per week.  The importance of healthy food choices with portion control discussed. Encouraged to start a food diary, count calories and to consider  joining a support group. Sample diet sheets offered. Goals set by the patient for the next several months.   Weight /BMI 08/17/2015 08/10/2015 04/20/2015  WEIGHT 193 lb 12.8 oz 190 lb 188 lb  HEIGHT 5\' 4"  5\' 4"  5\' 4"   BMI 33.25 kg/m2 32.6 kg/m2 32.25 kg/m2    Current exercise per week 90 minutes.   Fall (on)(from) sidewalk curb, subsequent encounter Initally evaluated in the Ed same day as fall, left upper lip trauma significant, hit head and left chest/ breast. Primarily currently fully resolved, expressed concern re left breast discomfort,  beneign breast exam and mammogram due in 1 month. Fall precautions and home safety reviewed   Hyperlipidemia Hyperlipidemia:Low fat diet discussed and encouraged.   Lipid Panel  Lab Results  Component Value Date   CHOL 186 11/30/2014   HDL 65 11/30/2014   LDLCALC 109 11/30/2014   TRIG 60 11/30/2014   CHOLHDL 2.9 11/30/2014    Updated lab needed  at/ before next visit.

## 2015-08-17 NOTE — Patient Instructions (Signed)
F/u as before , call if you need me sooner  Laceration to lip is fully healed, hopefully,the shape will return to normal. Vaseline only to happed lips  No mass felt on left breast mammogram when due in July  Pls consider pT for balance so you do not fall again!  Fall Prevention in the Home  Falls can cause injuries. They can happen to people of all ages. There are many things you can do to make your home safe and to help prevent falls.  WHAT CAN I DO ON THE OUTSIDE OF MY HOME?  Regularly fix the edges of walkways and driveways and fix any cracks.  Remove anything that might make you trip as you walk through a door, such as a raised step or threshold.  Trim any bushes or trees on the path to your home.  Use bright outdoor lighting.  Clear any walking paths of anything that might make someone trip, such as rocks or tools.  Regularly check to see if handrails are loose or broken. Make sure that both sides of any steps have handrails.  Any raised decks and porches should have guardrails on the edges.  Have any leaves, snow, or ice cleared regularly.  Use sand or salt on walking paths during winter.  Clean up any spills in your garage right away. This includes oil or grease spills. WHAT CAN I DO IN THE BATHROOM?   Use night lights.  Install grab bars by the toilet and in the tub and shower. Do not use towel bars as grab bars.  Use non-skid mats or decals in the tub or shower.  If you need to sit down in the shower, use a plastic, non-slip stool.  Keep the floor dry. Clean up any water that spills on the floor as soon as it happens.  Remove soap buildup in the tub or shower regularly.  Attach bath mats securely with double-sided non-slip rug tape.  Do not have throw rugs and other things on the floor that can make you trip. WHAT CAN I DO IN THE BEDROOM?  Use night lights.  Make sure that you have a light by your bed that is easy to reach.  Do not use any sheets or  blankets that are too big for your bed. They should not hang down onto the floor.  Have a firm chair that has side arms. You can use this for support while you get dressed.  Do not have throw rugs and other things on the floor that can make you trip. WHAT CAN I DO IN THE KITCHEN?  Clean up any spills right away.  Avoid walking on wet floors.  Keep items that you use a lot in easy-to-reach places.  If you need to reach something above you, use a strong step stool that has a grab bar.  Keep electrical cords out of the way.  Do not use floor polish or wax that makes floors slippery. If you must use wax, use non-skid floor wax.  Do not have throw rugs and other things on the floor that can make you trip. WHAT CAN I DO WITH MY STAIRS?  Do not leave any items on the stairs.  Make sure that there are handrails on both sides of the stairs and use them. Fix handrails that are broken or loose. Make sure that handrails are as long as the stairways.  Check any carpeting to make sure that it is firmly attached to the stairs. Fix any carpet  that is loose or worn.  Avoid having throw rugs at the top or bottom of the stairs. If you do have throw rugs, attach them to the floor with carpet tape.  Make sure that you have a light switch at the top of the stairs and the bottom of the stairs. If you do not have them, ask someone to add them for you. WHAT ELSE CAN I DO TO HELP PREVENT FALLS?  Wear shoes that:  Do not have high heels.  Have rubber bottoms.  Are comfortable and fit you well.  Are closed at the toe. Do not wear sandals.  If you use a stepladder:  Make sure that it is fully opened. Do not climb a closed stepladder.  Make sure that both sides of the stepladder are locked into place.  Ask someone to hold it for you, if possible.  Clearly Strauss and make sure that you can see:  Any grab bars or handrails.  First and last steps.  Where the edge of each step is.  Use tools  that help you move around (mobility aids) if they are needed. These include:  Canes.  Walkers.  Scooters.  Crutches.  Turn on the lights when you go into a dark area. Replace any light bulbs as soon as they burn out.  Set up your furniture so you have a clear path. Avoid moving your furniture around.  If any of your floors are uneven, fix them.  If there are any pets around you, be aware of where they are.  Review your medicines with your doctor. Some medicines can make you feel dizzy. This can increase your chance of falling. Ask your doctor what other things that you can do to help prevent falls.   This information is not intended to replace advice given to you by your health care provider. Make sure you discuss any questions you have with your health care provider.   Document Released: 12/31/2008 Document Revised: 07/21/2014 Document Reviewed: 04/10/2014 Elsevier Interactive Patient Education [employer].

## 2015-08-19 ENCOUNTER — Encounter: Payer: Medicare Other | Admitting: Family Medicine

## 2015-08-19 DIAGNOSIS — W101XXD Fall (on)(from) sidewalk curb, subsequent encounter: Secondary | ICD-10-CM | POA: Insufficient documentation

## 2015-08-19 NOTE — Assessment & Plan Note (Signed)
Deteriorated. Patient re-educated about  the importance of commitment to a  minimum of 150 minutes of exercise per week.  The importance of healthy food choices with portion control discussed. Encouraged to start a food diary, count calories and to consider  joining a support group. Sample diet sheets offered. Goals set by the patient for the next several months.   Weight /BMI 08/17/2015 08/10/2015 04/20/2015  WEIGHT 193 lb 12.8 oz 190 lb 188 lb  HEIGHT 5\' 4"  5\' 4"  5\' 4"   BMI 33.25 kg/m2 32.6 kg/m2 32.25 kg/m2    Current exercise per week 90 minutes.

## 2015-08-19 NOTE — Assessment & Plan Note (Signed)
Controlled, no change in medication DASH diet and commitment to daily physical activity for a minimum of 30 minutes discussed and encouraged, as a part of hypertension management. The importance of attaining a healthy weight is also discussed.  BP/Weight 08/17/2015 08/10/2015 04/20/2015 03/23/2015 03/09/2015 06/29/2014 99991111  Systolic BP [id number] 0000000 [id number] 0000000 [id number] 123456 0000000  Diastolic BP 82 72 80 74 82 82 102  Wt. (Lbs) 193.8 190 188 185 188.4 188 189  BMI 33.25 32.6 32.25 31.74 32.32 32.25 32.43

## 2015-08-19 NOTE — Assessment & Plan Note (Signed)
Symptom improvement, Lip laceration healed , however assymetric. Forehead laceration healed and denmies memory disturnbance or new headache Breast exam negative for bruise or lump

## 2015-08-19 NOTE — Assessment & Plan Note (Signed)
Initally evaluated in the Ed same day as fall, left upper lip trauma significant, hit head and left chest/ breast. Primarily currently fully resolved, expressed concern re left breast discomfort, beneign breast exam and mammogram due in 1 month. Fall precautions and home safety reviewed

## 2015-08-19 NOTE — Assessment & Plan Note (Signed)
Hyperlipidemia:Low fat diet discussed and encouraged.   Lipid Panel  Lab Results  Component Value Date   CHOL 186 11/30/2014   HDL 65 11/30/2014   LDLCALC 109 11/30/2014   TRIG 60 11/30/2014   CHOLHDL 2.9 11/30/2014    Updated lab needed at/ before next visit.

## 2015-08-27 DIAGNOSIS — E785 Hyperlipidemia, unspecified: Secondary | ICD-10-CM | POA: Diagnosis not present

## 2015-08-27 DIAGNOSIS — I10 Essential (primary) hypertension: Secondary | ICD-10-CM | POA: Diagnosis not present

## 2015-08-27 DIAGNOSIS — R7301 Impaired fasting glucose: Secondary | ICD-10-CM | POA: Diagnosis not present

## 2015-08-27 LAB — HEMOGLOBIN A1C
HEMOGLOBIN A1C: 6 % — AB (ref <5.7)
MEAN PLASMA GLUCOSE: 126 mg/dL

## 2015-08-28 LAB — CBC WITH DIFFERENTIAL/PLATELET
BASOS ABS: 0 {cells}/uL (ref 0–200)
Basophils Relative: 0 %
EOS PCT: 2 %
Eosinophils Absolute: 126 cells/uL (ref 15–500)
HCT: 42.2 % (ref 35.0–45.0)
Hemoglobin: 14 g/dL (ref 11.7–15.5)
LYMPHS PCT: 38 %
Lymphs Abs: 2394 cells/uL (ref 850–3900)
MCH: 26.8 pg — AB (ref 27.0–33.0)
MCHC: 33.2 g/dL (ref 32.0–36.0)
MCV: 80.8 fL (ref 80.0–100.0)
MONOS PCT: 11 %
MPV: 9.8 fL (ref 7.5–12.5)
Monocytes Absolute: 693 cells/uL (ref 200–950)
NEUTROS PCT: 49 %
Neutro Abs: 3087 cells/uL (ref 1500–7800)
PLATELETS: 332 10*3/uL (ref 140–400)
RBC: 5.22 MIL/uL — ABNORMAL HIGH (ref 3.80–5.10)
RDW: 14.4 % (ref 11.0–15.0)
WBC: 6.3 10*3/uL (ref 3.8–10.8)

## 2015-08-28 LAB — COMPLETE METABOLIC PANEL WITH GFR
ALBUMIN: 4.4 g/dL (ref 3.6–5.1)
ALK PHOS: 93 U/L (ref 33–130)
ALT: 22 U/L (ref 6–29)
AST: 21 U/L (ref 10–35)
BILIRUBIN TOTAL: 0.9 mg/dL (ref 0.2–1.2)
BUN: 16 mg/dL (ref 7–25)
CO2: 29 mmol/L (ref 20–31)
CREATININE: 0.91 mg/dL (ref 0.50–0.99)
Calcium: 10 mg/dL (ref 8.6–10.4)
Chloride: 99 mmol/L (ref 98–110)
GFR, EST NON AFRICAN AMERICAN: 65 mL/min (ref >=60)
GFR, Est African American: 76 mL/min (ref >=60)
GLUCOSE: 96 mg/dL (ref 65–99)
Potassium: 4.2 mmol/L (ref 3.5–5.3)
SODIUM: 138 mmol/L (ref 135–146)
TOTAL PROTEIN: 7.3 g/dL (ref 6.1–8.1)

## 2015-08-28 LAB — LIPID PANEL
Cholesterol: 199 mg/dL (ref 125–200)
HDL: 75 mg/dL (ref >=46)
LDL CALC: 105 mg/dL (ref <130)
Total CHOL/HDL Ratio: 2.7 Ratio (ref <=5.0)
Triglycerides: 95 mg/dL (ref <150)
VLDL: 19 mg/dL (ref <30)

## 2015-09-03 ENCOUNTER — Encounter: Payer: Self-pay | Admitting: Family Medicine

## 2015-09-03 ENCOUNTER — Ambulatory Visit (INDEPENDENT_AMBULATORY_CARE_PROVIDER_SITE_OTHER): Payer: Medicare Other | Admitting: Family Medicine

## 2015-09-03 VITALS — BP 120/72 | HR 72 | Resp 18 | Ht 64.0 in | Wt 193.0 lb

## 2015-09-03 DIAGNOSIS — E669 Obesity, unspecified: Secondary | ICD-10-CM

## 2015-09-03 DIAGNOSIS — E785 Hyperlipidemia, unspecified: Secondary | ICD-10-CM

## 2015-09-03 DIAGNOSIS — H1010 Acute atopic conjunctivitis, unspecified eye: Secondary | ICD-10-CM | POA: Insufficient documentation

## 2015-09-03 DIAGNOSIS — J302 Other seasonal allergic rhinitis: Secondary | ICD-10-CM

## 2015-09-03 DIAGNOSIS — R7303 Prediabetes: Secondary | ICD-10-CM | POA: Diagnosis not present

## 2015-09-03 DIAGNOSIS — E8881 Metabolic syndrome: Secondary | ICD-10-CM

## 2015-09-03 DIAGNOSIS — Z1211 Encounter for screening for malignant neoplasm of colon: Secondary | ICD-10-CM | POA: Diagnosis not present

## 2015-09-03 DIAGNOSIS — B359 Dermatophytosis, unspecified: Secondary | ICD-10-CM

## 2015-09-03 DIAGNOSIS — I10 Essential (primary) hypertension: Secondary | ICD-10-CM

## 2015-09-03 DIAGNOSIS — M25512 Pain in left shoulder: Secondary | ICD-10-CM

## 2015-09-03 DIAGNOSIS — Z1231 Encounter for screening mammogram for malignant neoplasm of breast: Secondary | ICD-10-CM

## 2015-09-03 DIAGNOSIS — M25511 Pain in right shoulder: Secondary | ICD-10-CM

## 2015-09-03 LAB — HEMOCCULT GUIAC POC 1CARD (OFFICE): Fecal Occult Blood, POC: NEGATIVE

## 2015-09-03 MED ORDER — CLOTRIMAZOLE-BETAMETHASONE 1-0.05 % EX CREA: TOPICAL_CREAM | CUTANEOUS | Status: DC

## 2015-09-03 MED ORDER — OLOPATADINE HCL 0.1 % OP SOLN: OPHTHALMIC | Status: DC

## 2015-09-03 NOTE — Assessment & Plan Note (Signed)
The increased risk of cardiovascular disease associated with this diagnosis, and the need to consistently work on lifestyle to change this is discussed. Following  a  heart healthy diet ,commitment to 30 minutes of exercise at least 5 days per week, as well as control of blood sugar and cholesterol , and achieving a healthy weight are all the areas to be addressed .  

## 2015-09-03 NOTE — Assessment & Plan Note (Signed)
Controlled, no change in medication DASH diet and commitment to daily physical activity for a minimum of 30 minutes discussed and encouraged, as a part of hypertension management. The importance of attaining a healthy weight is also discussed.  BP/Weight 09/03/2015 08/17/2015 08/10/2015 04/20/2015 03/23/2015 03/09/2015 [id number]  Systolic BP 123456 [id number] 0000000 [id number] 0000000 [id number] 123456  Diastolic BP 72 82 72 80 74 82 82  Wt. (Lbs) 193 193.8 190 188 185 188.4 188  BMI 33.11 33.25 32.6 32.25 31.74 32.32 32.25

## 2015-09-03 NOTE — Assessment & Plan Note (Signed)
Current mild flare, as needed patanol prescribed

## 2015-09-03 NOTE — Progress Notes (Signed)
Subjective:    Patient ID: Christina Carroll, female    DOB: 1945-09-10, 70 y.o.   MRN: [id number]  HPI   Christina Carroll     MRN: [id number]      DOB: 04-21-1945   HPI [name] is here for follow up and re-evaluation of chronic medical conditions, medication management and review of any available recent lab and radiology data.  Preventive health is updated, specifically  Cancer screening and Immunization.   Questions or concerns regarding consultations or procedures which the PT has had in the interim are  addressed. The PT denies any adverse reactions to current medications since the last visit.  C/o bilateral shoulder pain, and requests ortho eval C/o itchy rash on right buttock, some response to topical steroid , wants further help C/o itchy watery eye wants drop for as needed use ROS Denies recent fever or chills. Denies sinus pressure, nasal congestion, ear pain or sore throat. Denies chest congestion, productive cough or wheezing. Denies chest pains, palpitations and leg swelling Denies abdominal pain, nausea, vomiting,diarrhea or constipation.   Denies dysuria, frequency, hesitancy or incontinence.  Denies headaches, seizures, numbness, or tingling. Denies depression, anxiety or insomnia.    PE  BP 120/72 mmHg  Pulse 72  Resp 18  Ht 5\' 4"  (1.626 m)  Wt 193 lb (87.544 kg)  BMI 33.11 kg/m2  SpO2 99%  Patient alert and oriented and in no cardiopulmonary distress.  HEENT: No facial asymmetry, EOMI,   oropharynx pink and moist.  Neck supple no JVD, no mass.mild watering and redness of left eye  Chest: Clear to auscultation bilaterally.  CVS: S1, S2 no murmurs, no S3.Regular rate.  ABD: Soft non tender. No organomegaly or mass, normal BS Rectal: no mass, heme negative stool  Ext: No edema  MS: Adequate ROM spine, mildly reduced in  Shoulders, adequate in  hips and knees.  Skin: Intact,scaly  rash noted on right buttock  Psych: Good eye  contact, normal affect. Memory intact not anxious or depressed appearing.  CNS: CN 2-12 intact, power,  normal throughout.no focal deficits noted.   Assessment & Plan  Essential hypertension Controlled, no change in medication DASH diet and commitment to daily physical activity for a minimum of 30 minutes discussed and encouraged, as a part of hypertension management. The importance of attaining a healthy weight is also discussed.  BP/Weight 09/03/2015 08/17/2015 08/10/2015 04/20/2015 03/23/2015 03/09/2015 Q000111Q  Systolic BP 123456 [id number] 0000000 [id number] 0000000 [id number] 123456  Diastolic BP 72 82 72 80 74 82 82  Wt. (Lbs) 193 193.8 190 188 185 188.4 188  BMI 33.11 33.25 32.6 32.25 31.74 32.32 32.25        Hyperlipidemia Controlled, no change in medication Hyperlipidemia:Low fat diet discussed and encouraged.   Lipid Panel  Lab Results  Component Value Date   CHOL 199 08/27/2015   HDL 75 08/27/2015   LDLCALC 105 08/27/2015   TRIG 95 08/27/2015   CHOLHDL 2.7 08/27/2015        Obesity Deteriorated. Patient re-educated about  the importance of commitment to a  minimum of 150 minutes of exercise per week.  The importance of healthy food choices with portion control discussed. Encouraged to start a food diary, count calories and to consider  joining a support group. Sample diet sheets offered. Goals set by the patient for the next several months.   Weight /BMI 09/03/2015 08/17/2015 08/10/2015  WEIGHT 193 lb 193 lb 12.8 oz 190 lb  HEIGHT 5\' 4"   5\' 4"  5\' 4"   BMI 33.11 kg/m2 33.25 kg/m2 32.6 kg/m2    Current exercise per week 60 minutes.   Seasonal allergies Controlled, no change in medication   Metabolic syndrome X The increased risk of cardiovascular disease associated with this diagnosis, and the need to consistently work on lifestyle to change this is discussed. Following  a  heart healthy diet ,commitment to 30 minutes of exercise at least 5 days per week, as well as control of blood  sugar and cholesterol , and achieving a healthy weight are all the areas to be addressed .   Prediabetes unchnaged Patient educated about the importance of limiting  Carbohydrate intake , the need to commit to daily physical activity for a minimum of 30 minutes , and to commit weight loss. The fact that changes in all these areas will reduce or eliminate all together the development of diabetes is stressed.   Diabetic Labs Latest Ref Rng 08/27/2015 03/02/2015 11/30/2014 06/23/2014 01/28/2014  HbA1c <5.7 % 6.0(H) 6.0(H) 6.0(H) 6.2(H) 5.9(H)  Chol 125 - 200 mg/dL 199 - 186 192 178  HDL >=46 mg/dL 75 - 65 72 67  Calc LDL <130 mg/dL 105 - 109 105(H) 94  Triglycerides <150 mg/dL 95 - 60 77 85  Creatinine 0.50 - 0.99 mg/dL 0.91 0.92 0.83 0.94 0.92   BP/Weight 09/03/2015 08/17/2015 08/10/2015 04/20/2015 03/23/2015 03/09/2015 [id number]  Systolic BP 123456 [id number] 0000000 [id number] 0000000 [id number] 123456  Diastolic BP 72 82 72 80 74 82 82  Wt. (Lbs) 193 193.8 190 188 185 188.4 188  BMI 33.11 33.25 32.6 32.25 31.74 32.32 32.25   No flowsheet data found.     Tinea Rash on right buttock, dia approx 2.5 cm, topical antifungal prescribed  Allergic conjunctivitis Current mild flare, as needed patanol prescribed  Shoulder pain, bilateral Bilateral shoulder pain with reduced mobility, at times, refer for ortho eval        Review of Systems     Objective:   Physical Exam        Assessment & Plan:

## 2015-09-03 NOTE — Patient Instructions (Addendum)
Annual wellness Mar 09, 2016, call if you need me before  Excellent labs and exam  You are referred to Dr Percell Miller to evaluate both shoulders  Come in September for your Flu vaccine  Mammogram due end July or early August please schedule  New medications are eye drop for allergy and antifungal cream  Rectal exam is normal  Fasting lipid, cmp , HBa1C in 5 months and 3 weeks  Please work on good  health habits so that your health will improve. 1. Commitment to daily physical activity for 30 to 60  minutes, if you are able to do this.  2. Commitment to wise food choices. Aim for half of your  food intake to be vegetable and fruit, one quarter starchy foods, and one quarter protein. Try to eat on a regular schedule  3 meals per day, snacking between meals should be limited to vegetables or fruits or small portions of nuts. 64 ounces of water per day is generally recommended, unless you have specific health conditions, like heart failure or kidney failure where you will need to limit fluid intake.  3. Commitment to sufficient and a  good quality of physical and mental rest daily, generally between 6 to 8 hours per day.  WITH PERSISTANCE AND PERSEVERANCE, THE IMPOSSIBLE , BECOMES THE NORM!   Fasting lipid, cmp and HBA1C

## 2015-09-03 NOTE — Assessment & Plan Note (Signed)
Rash on right buttock, dia approx 2.5 cm, topical antifungal prescribed

## 2015-09-03 NOTE — Assessment & Plan Note (Signed)
Controlled, no change in medication  

## 2015-09-03 NOTE — Assessment & Plan Note (Signed)
Controlled, no change in medication Hyperlipidemia:Low fat diet discussed and encouraged.   Lipid Panel  Lab Results  Component Value Date   CHOL 199 08/27/2015   HDL 75 08/27/2015   LDLCALC 105 08/27/2015   TRIG 95 08/27/2015   CHOLHDL 2.7 08/27/2015

## 2015-09-03 NOTE — Assessment & Plan Note (Signed)
Bilateral shoulder pain with reduced mobility, at times, refer for ortho eval

## 2015-09-03 NOTE — Assessment & Plan Note (Signed)
unchnaged Patient educated about the importance of limiting  Carbohydrate intake , the need to commit to daily physical activity for a minimum of 30 minutes , and to commit weight loss. The fact that changes in all these areas will reduce or eliminate all together the development of diabetes is stressed.   Diabetic Labs Latest Ref Rng 08/27/2015 03/02/2015 11/30/2014 06/23/2014 01/28/2014  HbA1c <5.7 % 6.0(H) 6.0(H) 6.0(H) 6.2(H) 5.9(H)  Chol 125 - 200 mg/dL 199 - 186 192 178  HDL >=46 mg/dL 75 - 65 72 67  Calc LDL <130 mg/dL 105 - 109 105(H) 94  Triglycerides <150 mg/dL 95 - 60 77 85  Creatinine 0.50 - 0.99 mg/dL 0.91 0.92 0.83 0.94 0.92   BP/Weight 09/03/2015 08/17/2015 08/10/2015 04/20/2015 03/23/2015 03/09/2015 Q000111Q  Systolic BP 123456 [id number] 0000000 [id number] 0000000 [id number] 123456  Diastolic BP 72 82 72 80 74 82 82  Wt. (Lbs) 193 193.8 190 188 185 188.4 188  BMI 33.11 33.25 32.6 32.25 31.74 32.32 32.25   No flowsheet data found.

## 2015-09-03 NOTE — Assessment & Plan Note (Signed)
Deteriorated. Patient re-educated about  the importance of commitment to a  minimum of 150 minutes of exercise per week.  The importance of healthy food choices with portion control discussed. Encouraged to start a food diary, count calories and to consider  joining a support group. Sample diet sheets offered. Goals set by the patient for the next several months.   Weight /BMI 09/03/2015 08/17/2015 08/10/2015  WEIGHT 193 lb 193 lb 12.8 oz 190 lb  HEIGHT 5\' 4"  5\' 4"  5\' 4"   BMI 33.11 kg/m2 33.25 kg/m2 32.6 kg/m2    Current exercise per week 60 minutes.

## 2015-10-14 DIAGNOSIS — M25512 Pain in left shoulder: Secondary | ICD-10-CM | POA: Diagnosis not present

## 2015-10-14 DIAGNOSIS — M7541 Impingement syndrome of right shoulder: Secondary | ICD-10-CM | POA: Diagnosis not present

## 2015-10-28 ENCOUNTER — Ambulatory Visit (HOSPITAL_COMMUNITY)
Admission: RE | Admit: 2015-10-28 | Discharge: 2015-10-28 | Disposition: A | Discharge status: Home / Self Care | Payer: Medicare Other | Source: Ambulatory Visit | Attending: Family Medicine | Admitting: Family Medicine

## 2015-10-28 DIAGNOSIS — Z1231 Encounter for screening mammogram for malignant neoplasm of breast: Secondary | ICD-10-CM | POA: Diagnosis not present

## 2015-10-29 ENCOUNTER — Telehealth: Payer: Self-pay | Admitting: Family Medicine

## 2015-10-29 ENCOUNTER — Other Ambulatory Visit: Payer: Self-pay | Admitting: Family Medicine

## 2015-10-29 MED ORDER — FLUTICASONE PROPIONATE 50 MCG/ACT NA SUSP
2.0000 | Freq: Every day | NASAL | 5 refills | Status: DC
Start: 2015-10-29 — End: 2017-01-01

## 2015-10-29 MED ORDER — BENZONATATE 100 MG PO CAPS: 100.0000 mg | ORAL_CAPSULE | Freq: Three times a day (TID) | ORAL | 0 refills | Status: DC | PRN

## 2015-10-29 NOTE — Telephone Encounter (Signed)
Patient states that cough is productive with thick white sputum.  Has been taking otc cough syrup with no relief.  No fever, chill, or sweats.  Will refill Flonase. Please advise.

## 2015-10-29 NOTE — Telephone Encounter (Signed)
Called and left message for patient notifying of refill and new medication.  Medications sent to pharmacy [facility]).

## 2015-10-29 NOTE — Telephone Encounter (Signed)
Christina Carroll is calling c/o cough and congestion, she has tried over the counter medication with no relief, symptoms for 3 days now, denies fever. Please advise?

## 2015-10-29 NOTE — Telephone Encounter (Signed)
pls advise ensure adequate water intake to help to loosen sputum, 64 oz or more. Tessalon perels are entered , pls send to her pharmacy of choice

## 2015-11-11 DIAGNOSIS — M25511 Pain in right shoulder: Secondary | ICD-10-CM | POA: Diagnosis not present

## 2015-12-01 ENCOUNTER — Ambulatory Visit (INDEPENDENT_AMBULATORY_CARE_PROVIDER_SITE_OTHER): Payer: Medicare Other

## 2015-12-01 DIAGNOSIS — Z23 Encounter for immunization: Secondary | ICD-10-CM | POA: Diagnosis not present

## 2015-12-09 ENCOUNTER — Other Ambulatory Visit (HOSPITAL_COMMUNITY): Payer: Self-pay | Admitting: Orthopaedic Surgery

## 2015-12-09 DIAGNOSIS — M25511 Pain in right shoulder: Secondary | ICD-10-CM

## 2015-12-15 ENCOUNTER — Ambulatory Visit (HOSPITAL_COMMUNITY)
Admission: RE | Admit: 2015-12-15 | Discharge: 2015-12-15 | Disposition: A | Discharge status: Home / Self Care | Payer: Medicare Other | Source: Ambulatory Visit | Attending: Orthopaedic Surgery | Admitting: Orthopaedic Surgery

## 2015-12-15 DIAGNOSIS — M24011 Loose body in right shoulder: Secondary | ICD-10-CM | POA: Insufficient documentation

## 2015-12-15 DIAGNOSIS — M25511 Pain in right shoulder: Secondary | ICD-10-CM

## 2015-12-15 DIAGNOSIS — M659 Synovitis and tenosynovitis, unspecified: Secondary | ICD-10-CM | POA: Diagnosis not present

## 2015-12-15 DIAGNOSIS — M25411 Effusion, right shoulder: Secondary | ICD-10-CM | POA: Diagnosis not present

## 2015-12-16 DIAGNOSIS — M7541 Impingement syndrome of right shoulder: Secondary | ICD-10-CM | POA: Diagnosis not present

## 2015-12-23 ENCOUNTER — Ambulatory Visit (HOSPITAL_COMMUNITY): Payer: Medicare Other | Attending: Orthopaedic Surgery | Admitting: Occupational Therapy

## 2015-12-23 ENCOUNTER — Encounter (HOSPITAL_COMMUNITY): Payer: Self-pay | Admitting: Occupational Therapy

## 2015-12-23 DIAGNOSIS — R29898 Other symptoms and signs involving the musculoskeletal system: Secondary | ICD-10-CM | POA: Diagnosis not present

## 2015-12-23 DIAGNOSIS — M25511 Pain in right shoulder: Secondary | ICD-10-CM | POA: Diagnosis not present

## 2015-12-23 NOTE — Therapy (Signed)
[place], [place], [address] Phone: [phone]   Fax:  [phone]  Occupational Therapy Evaluation  Patient Details  Name: Christina Carroll MRN: [id number] Date of Birth: 09/28/1945 Referring Provider: Dr. Rodell Perna  Encounter Date: 12/23/2015      OT End of Session - 12/23/15 1216    Visit Number 1   Number of Visits 12   Date for OT Re-Evaluation 02/03/16   Authorization Type Medicare A & B   Authorization Time Period Before 10th visit   Authorization - Visit Number 1   Authorization - Number of Visits 10   OT Start Time 0945   OT Stop Time 1017   OT Time Calculation (min) 32 min   Activity Tolerance Patient tolerated treatment well   Behavior During Therapy [facility] for tasks assessed/performed      Past Medical History:  Diagnosis Date  . Arthritis   . Essential hypertension   . Hyperlipidemia   . Lymphocytic thyroiditis   . Obesity   . Prediabetes     Past Surgical History:  Procedure Laterality Date  . Bilateral foot surgery bone removed  2000  . COLONOSCOPY  11/30/2011   Procedure: COLONOSCOPY;  Surgeon: Rogene Houston, MD;  [facility];  Service: Endoscopy;  Laterality: N/A;  830  . Lumpectomy removed from thoracic spine area  1999    There were no vitals filed for this visit.      Subjective Assessment - 12/23/15 1211    Subjective  S: I have a lot of trouble reaching behind me.    Pertinent History Pt is a 70 y/o female presenting with right shoulder pain that began approximately two months ago when she lifted a case of water. MRI shows loose body in shoulder as well. Dr. Rodell Perna referred pt to occupational therapy for evaluation and treatment.    Special Tests FOTO Score: 60/100 (40% impairment)   Patient Stated Goals To be able to use my arm with less pain   Currently in Pain? No/denies           [facility] OT Assessment - 12/23/15 0945      Assessment   Diagnosis Right shoulder pain/impingement   Referring Provider Dr. Rodell Perna   Onset Date 10/23/15   Prior Therapy approximately 5 years ago for bilateral rotator cuff "bruising"     Precautions   Precautions None     Restrictions   Weight Bearing Restrictions No     Balance Screen   Has the patient fallen in the past 6 months Yes   How many times? 1   Has the patient had a decrease in activity level because of a fear of falling?  No   Is the patient reluctant to leave their home because of a fear of falling?  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, has greeting card buisness     ADL   ADL comments Pt is having difficulty reaching behind her back, reaching overhead, lifting weighted objects, Armed forces technical officer Expression   Dominant Hand Right     Cognition   Overall Cognitive Status Within Functional Limits for tasks assessed     ROM / Strength   AROM / PROM / Strength AROM;PROM;Strength  AROM   Overall AROM Comments Assessed seated, ER/IR adducted   AROM Assessment Site Shoulder   Right/Left Shoulder Right   Right Shoulder Flexion 139 Degrees   Right Shoulder ABduction 94 Degrees   Right Shoulder Internal Rotation 90 Degrees   Right Shoulder External Rotation 46 Degrees     PROM   Overall PROM Comments Assessed supine, ER/IR adducted   PROM Assessment Site Shoulder   Right/Left Shoulder Right   Right Shoulder Flexion 148 Degrees   Right Shoulder ABduction 99 Degrees   Right Shoulder Internal Rotation 90 Degrees   Right Shoulder External Rotation 74 Degrees     Strength   Overall Strength Comments Assessed seated, ER/IR adducted   Strength Assessment Site Shoulder   Right/Left Shoulder Right   Right Shoulder Flexion 4-/5   Right Shoulder ABduction 3/5   Right Shoulder Internal Rotation 4-/5    Right Shoulder External Rotation 4-/5                         OT Education - 12/23/15 1007    Education provided Yes   Education Details Table slides-flexion and abduction, ER stretch   Person(s) Educated Patient   Methods Explanation;Demonstration;Handout   Comprehension Verbalized understanding;Returned demonstration          OT Short Term Goals - 12/23/15 1220      OT SHORT TERM GOAL #1   Title Pt will be provided with and educated on HEP.    Time 6   Period Weeks   Status New     OT SHORT TERM GOAL #2   Title Pt will decrease pain in RUE to 4/10 to improve ability to complete tasks using RUE as dominant.    Time 6   Period Weeks   Status New     OT SHORT TERM GOAL #3   Title Pt will decrease RUE fascial restrictions to minimal amounts to improve mobility when performing reaching tasks.    Time 6   Period Weeks   Status New     OT SHORT TERM GOAL #4   Title Pt will increase A/ROM of RUE to [facility] to improve ability to perform overhead reaching tasks.    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    Time 6   Period Weeks   Status New                  Plan - 12/23/15 1217    Clinical Impression Statement A: Pt is a 70 y/o female presenting with right shoulder pain, experiencing increased pain and fascial restrictions, decreased range of motion and strength in RUE limiting functional task performance. Pt provided with HEP including table slides and ER stretch.    Rehab Potential Good   OT Frequency 2x / week   OT Duration 6 weeks   OT Treatment/Interventions Self-care/ADL training;Therapeutic exercise;Patient/family education;Manual Therapy;Cryotherapy;Electrical Stimulation;Moist Heat;Passive range of motion;Therapeutic activities   Plan P: Pt will benefit from skilled OT services to decrease pain and  fascial restrictions, increase range of motion and strength, and improve functional use of RUE as dominant. Treatment Plan: myofascial release, manual therapy, P/ROM, AA/ROM, A/ROM, general RUE strengthening, scapular stability and strengthening.    OT Home Exercise Plan 10/5: table slides, ER stretch  Consulted and Agree with Plan of Care Patient      Patient will benefit from skilled therapeutic intervention in order to improve the following deficits and impairments:  Impaired flexibility, Decreased strength, Pain, Decreased range of motion, Increased fascial restricitons, Impaired UE functional use  Visit Diagnosis: Acute pain of right shoulder  Other symptoms and signs involving the musculoskeletal system      G-Codes - 01/17/2016 1225    Functional Assessment Tool Used FOTO Score: 60/100 (40% impairment)   Functional Limitation Carrying, moving and handling objects   Carrying, Moving and Handling Objects Current Status [id number]) At least 40 percent but less than 60 percent impaired, limited or restricted   Carrying, Moving and Handling Objects Goal Status [id number]) At least 1 percent but less than 20 percent impaired, limited or restricted      Problem List Patient Active Problem List   Diagnosis Date Noted  . Tinea 09/03/2015  . Allergic conjunctivitis 09/03/2015  . Shoulder pain, bilateral 09/03/2015  . Metabolic syndrome X [id number]  . Skin lesion of breast 10/06/2013  . Benign pigmented mole 09/22/2013  . Seasonal allergies 07/10/2012  . Prediabetes 02/22/2012  . NEVUS 05/12/2010  . BACK PAIN 02/02/2010  . TENDINITIS 02/02/2010  . UNSPECIFIED DISORDER OF THYROID 08/30/2007  . Hyperlipidemia 08/30/2007  . Obesity 08/30/2007  . Essential hypertension 08/30/2007   Guadelupe Sabin, OTR/L  [phone] 17-Jan-2016, 12:25 PM  [facility] [address] [place], [place], [address] Phone: [phone]   Fax:  [phone]  Name:  Christina Carroll MRN: [id number] Date of Birth: 02/10/1946

## 2015-12-23 NOTE — Patient Instructions (Signed)
SHOULDER: Flexion On Table   Place hands on table, elbows straight. Move hips away from body. Press hands down into table. _10-15__ reps per set, _1-2__ sets per day  Abduction (Passive)   With arm out to side, resting on table, lower head toward arm, keeping trunk away from table.  Repeat _10-15___ times. Do _1-2___ sessions per day.  Copyright  VHI. All rights reserved.     External Rotation Stretch:     Place your affected hand on the wall with the elbow bent and gently turn your body the opposite direction until a stretch is felt. Hold for 5 seconds, repeat 3-5 times, once or twice per day.

## 2015-12-28 ENCOUNTER — Ambulatory Visit (HOSPITAL_COMMUNITY): Payer: Medicare Other

## 2015-12-28 ENCOUNTER — Encounter (HOSPITAL_COMMUNITY): Payer: Self-pay

## 2015-12-28 DIAGNOSIS — M25511 Pain in right shoulder: Secondary | ICD-10-CM | POA: Diagnosis not present

## 2015-12-28 DIAGNOSIS — R29898 Other symptoms and signs involving the musculoskeletal system: Secondary | ICD-10-CM

## 2015-12-29 ENCOUNTER — Ambulatory Visit (HOSPITAL_COMMUNITY): Payer: Medicare Other

## 2015-12-29 DIAGNOSIS — R29898 Other symptoms and signs involving the musculoskeletal system: Secondary | ICD-10-CM

## 2015-12-29 DIAGNOSIS — M25511 Pain in right shoulder: Secondary | ICD-10-CM | POA: Diagnosis not present

## 2015-12-29 NOTE — Therapy (Signed)
[place], [place], [address] Phone: [phone]   Fax:  [phone]  Occupational Therapy Treatment  Patient Details  Name: Christina Carroll MRN: [id number] Date of Birth: 14-Jul-1945 Referring Provider: Dr. Rodell Perna  Encounter Date: 12/29/2015      OT End of Session - 12/29/15 0911    Visit Number 3   Number of Visits 12   Date for OT Re-Evaluation 02/03/16   Authorization Type Medicare A & B   Authorization Time Period Before 10th visit   Authorization - Visit Number 3   Authorization - Number of Visits 10   OT Start Time 0820   OT Stop Time 0900   OT Time Calculation (min) 40 min   Activity Tolerance Patient tolerated treatment well   Behavior During Therapy [facility] for tasks assessed/performed      Past Medical History:  Diagnosis Date  . Arthritis   . Essential hypertension   . Hyperlipidemia   . Lymphocytic thyroiditis   . Obesity   . Prediabetes     Past Surgical History:  Procedure Laterality Date  . Bilateral foot surgery bone removed  2000  . COLONOSCOPY  11/30/2011   Procedure: COLONOSCOPY;  Surgeon: Rogene Houston, MD;  [facility];  Service: Endoscopy;  Laterality: N/A;  830  . Lumpectomy removed from thoracic spine area  1999    There were no vitals filed for this visit.      Subjective Assessment - 12/29/15 0909    Subjective  S: When I was washing this morning the pain was a 10 but now it doesn't hurt me.    Currently in Pain? No/denies            [facility] OT Assessment - 12/29/15 0910      Assessment   Diagnosis Right shoulder pain/impingement     Precautions   Precautions None                  OT Treatments/Exercises (OP) - 12/29/15 0910      Exercises   Exercises Shoulder     Shoulder Exercises: Supine   Protraction PROM;AAROM;10 reps   Horizontal ABduction PROM;AAROM;10 reps   External Rotation PROM;AAROM;10 reps   Internal  Rotation PROM;AAROM;10 reps   Flexion PROM;AAROM;10 reps   ABduction PROM;AAROM;10 reps     Shoulder Exercises: Seated   Elevation AROM;10 reps   Extension AROM;10 reps   Row AROM;10 reps   Protraction AAROM;10 reps   Horizontal ABduction AAROM;10 reps   External Rotation AAROM;10 reps   Internal Rotation AAROM;10 reps   Flexion AAROM;10 reps   Abduction AAROM;10 reps     Manual Therapy   Manual Therapy Muscle Energy Technique   Manual therapy comments manual therapy completed prior to exercises.   Muscle Energy Technique Myofascial release and manual stretching completed to right upper arm, trapezius, and scapularis region to decrease fascial restrictions and increase joint mobility in a pain free zone.                 OT Education - 12/29/15 0855    Education provided Yes   Education Details AA/ROM shoulder exercises   Person(s) Educated Patient   Methods Explanation;Handout;Verbal cues;Demonstration   Comprehension Verbalized understanding;Returned demonstration          OT Short Term Goals - 12/28/15 1646      OT SHORT TERM GOAL #1   Title Pt will be provided with and  educated on HEP.    Time 6   Period Weeks   Status On-going     OT SHORT TERM GOAL #2   Title Pt will decrease pain in RUE to 4/10 to improve ability to complete tasks using RUE as dominant.    Time 6   Period Weeks   Status On-going     OT SHORT TERM GOAL #3   Title Pt will decrease RUE fascial restrictions to minimal amounts to improve mobility when performing reaching tasks.    Time 6   Period Weeks   Status On-going     OT SHORT TERM GOAL #4   Title Pt will increase A/ROM of RUE to [facility] to improve ability to perform overhead reaching tasks.    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    Time 6   Period Weeks   Status On-going                   Plan - 12/29/15 0911    Clinical Impression Statement A: Pt was able to tolerate AA/ROM supine and seated this session. HEP was updated to include AA/ROM. VC for form and technique.   Plan P: Add PVC pipe slide.   OT Home Exercise Plan 10/5: table slides, ER stretch. 10/11: AA/ROM shoulder exercises.       Patient will benefit from skilled therapeutic intervention in order to improve the following deficits and impairments:  Impaired flexibility, Decreased strength, Pain, Decreased range of motion, Increased fascial restricitons, Impaired UE functional use  Visit Diagnosis: Other symptoms and signs involving the musculoskeletal system    Problem List Patient Active Problem List   Diagnosis Date Noted  . Tinea 09/03/2015  . Allergic conjunctivitis 09/03/2015  . Shoulder pain, bilateral 09/03/2015  . Metabolic syndrome X [id number]  . Skin lesion of breast 10/06/2013  . Benign pigmented mole 09/22/2013  . Seasonal allergies 07/10/2012  . Prediabetes 02/22/2012  . NEVUS 05/12/2010  . BACK PAIN 02/02/2010  . TENDINITIS 02/02/2010  . UNSPECIFIED DISORDER OF THYROID 08/30/2007  . Hyperlipidemia 08/30/2007  . Obesity 08/30/2007  . Essential hypertension 08/30/2007   Ailene Ravel, OTR/L,CBIS  [phone]  12/29/2015, 9:13 AM  [place] [facility] [address] [place], [place], [address] Phone: [phone]   Fax:  [phone]  Name: JAMEY REMER MRN: [id number] Date of Birth: 04-Oct-1945

## 2015-12-29 NOTE — Therapy (Signed)
[place], [place], [address] Phone: [phone]   Fax:  [phone]  Occupational Therapy Treatment  Patient Details  Name: Christina Carroll MRN: [id number] Date of Birth: 02/24/1946 Referring Provider: Dr. Rodell Perna  Encounter Date: 12/28/2015      OT End of Session - 12/29/15 0815    Visit Number 2   Number of Visits 12   Date for OT Re-Evaluation 02/03/16   Authorization Type Medicare A & B   Authorization Time Period Before 10th visit   Authorization - Visit Number 2   Authorization - Number of Visits 10   OT Start Time 1655   OT Stop Time 1730   OT Time Calculation (min) 35 min   Activity Tolerance Patient tolerated treatment well   Behavior During Therapy [facility] for tasks assessed/performed      Past Medical History:  Diagnosis Date  . Arthritis   . Essential hypertension   . Hyperlipidemia   . Lymphocytic thyroiditis   . Obesity   . Prediabetes     Past Surgical History:  Procedure Laterality Date  . Bilateral foot surgery bone removed  2000  . COLONOSCOPY  11/30/2011   Procedure: COLONOSCOPY;  Surgeon: Rogene Houston, MD;  [facility];  Service: Endoscopy;  Laterality: N/A;  830  . Lumpectomy removed from thoracic spine area  1999    There were no vitals filed for this visit.      Subjective Assessment - 12/29/15 0807    Subjective  S: My left shoulder is starting to bother me now.    Currently in Pain? Yes   Pain Score 7    Pain Location Shoulder   Pain Orientation Right   Pain Descriptors / Indicators Aching   Pain Type Acute pain   Pain Radiating Towards N/A   Pain Onset More than a month ago   Pain Frequency Constant   Aggravating Factors  Movement   Pain Relieving Factors pain meds   Effect of Pain on Daily Activities None   Multiple Pain Sites Yes   Pain Score 8   Pain Location Shoulder   Pain Orientation Left   Pain Descriptors / Indicators Aching   Pain Type Acute pain   Pain Radiating Towards N/A   Pain Onset In the past 7 days   Pain Frequency Intermittent   Aggravating Factors  Movement and use   Pain Relieving Factors Rest and pain meds   Effect of Pain on Daily Activities None            [facility] OT Assessment - 12/28/15 0810      Assessment   Diagnosis Right shoulder pain/impingement     Precautions   Precautions None                  OT Treatments/Exercises (OP) - 12/28/15 0810      Exercises   Exercises Shoulder     Shoulder Exercises: Supine   Protraction PROM;AAROM;10 reps   Horizontal ABduction PROM;AAROM;10 reps   External Rotation PROM;AAROM;10 reps   Internal Rotation PROM;AAROM;10 reps   Flexion PROM;AAROM;10 reps   ABduction PROM;AAROM;10 reps     Manual Therapy   Manual Therapy Muscle Energy Technique   Manual therapy comments manual therapy completed prior to exercises.   Muscle Energy Technique Myofascial release and manual stretching completed to right upper arm, trapezius, and scapularis region to decrease fascial restrictions and increase joint mobility in a pain  free zone.                 OT Education - 12/29/15 [phone]    Education provided Yes   Education Details Pt was given OT eval handout. Goals and plan of care reviewed.    Person(s) Educated Patient   Methods Explanation;Handout   Comprehension Verbalized understanding          OT Short Term Goals - 12/28/15 1646      OT SHORT TERM GOAL #1   Title Pt will be provided with and educated on HEP.    Time 6   Period Weeks   Status On-going     OT SHORT TERM GOAL #2   Title Pt will decrease pain in RUE to 4/10 to improve ability to complete tasks using RUE as dominant.    Time 6   Period Weeks   Status On-going     OT SHORT TERM GOAL #3   Title Pt will decrease RUE fascial restrictions to minimal amounts to improve mobility when performing reaching tasks.    Time 6   Period Weeks   Status On-going      OT SHORT TERM GOAL #4   Title Pt will increase A/ROM of RUE to [facility] to improve ability to perform overhead reaching tasks.    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    Time 6   Period Weeks   Status On-going                  Plan - 12/29/15 0816    Clinical Impression Statement A: Initiated myofascial release, manual stretching, and AA/ROM. patient required VC for form and technique. Moderate pain felt during supine exercises.    Plan P: Progress to seated AA/ROM exercises as able. Add therapy ball stretches.   OT Home Exercise Plan 10/5: table slides, ER stretch      Patient will benefit from skilled therapeutic intervention in order to improve the following deficits and impairments:  Impaired flexibility, Decreased strength, Pain, Decreased range of motion, Increased fascial restricitons, Impaired UE functional use  Visit Diagnosis: Acute pain of right shoulder  Other symptoms and signs involving the musculoskeletal system    Problem List Patient Active Problem List   Diagnosis Date Noted  . Tinea 09/03/2015  . Allergic conjunctivitis 09/03/2015  . Shoulder pain, bilateral 09/03/2015  . Metabolic syndrome X [id number]  . Skin lesion of breast 10/06/2013  . Benign pigmented mole 09/22/2013  . Seasonal allergies 07/10/2012  . Prediabetes 02/22/2012  . NEVUS 05/12/2010  . BACK PAIN 02/02/2010  . TENDINITIS 02/02/2010  . UNSPECIFIED DISORDER OF THYROID 08/30/2007  . Hyperlipidemia 08/30/2007  . Obesity 08/30/2007  . Essential hypertension 08/30/2007   Ailene Ravel, OTR/L,CBIS  [phone]  12/29/2015, 8:19 AM  [facility] [address] [place], [place], [address] Phone: [phone]   Fax:  [phone]  Name: LASHARN BRESLIN MRN: [id number] Date of Birth:  Sep 23, 1945

## 2015-12-29 NOTE — Patient Instructions (Signed)
Perform each exercise _____10-12___ reps. 2-3x days.   Protraction - STANDING  Start by holding a wand or cane at chest height.  Next, slowly push the wand outwards in front of your body so that your elbows become fully straightened. Then, return to the original position.     Shoulder FLEXION - STANDING - PALMS DOWN  In the standing position, hold a wand/cane with both arms, palms down on both sides. Raise up the wand/cane allowing your unaffected arm to perform most of the effort. Your affected arm should be partially relaxed.      Internal/External ROTATION - STANDING  In the standing position, hold a wand/cane with both hands keeping your elbows bent. Move your arms and wand/cane to one side.  Your affected arm should be partially relaxed while your unaffected arm performs most of the effort.       Shoulder ABDUCTION - STANDING  While holding a wand/cane palm face up on the injured side and palm face down on the uninjured side, slowly raise up your injured arm to the side.       Horizontal Abduction/Adduction      Straight arms holding cane at shoulder height, bring cane to right, center, left. Repeat starting to left.   Copyright  VHI. All rights reserved.       

## 2016-01-02 ENCOUNTER — Other Ambulatory Visit: Payer: Self-pay | Admitting: Family Medicine

## 2016-01-03 ENCOUNTER — Ambulatory Visit: Payer: Medicare Other

## 2016-01-03 VITALS — BP 142/86

## 2016-01-03 DIAGNOSIS — I10 Essential (primary) hypertension: Secondary | ICD-10-CM

## 2016-01-03 NOTE — Progress Notes (Addendum)
Patient in for blood pressure check.  States that she had to abnormally high readings over the weekend and just wanted to double check.   Blood pressure checked manually.   Patient states that she had had blood pressure checked using a machine over the weekend and attributes high readings to increased stress.  Patient recently travelled to [place] back within 24 hours.   Will continue to take prescribed medications and will check blood pressure with her cuff at home that she is going to go and have calibrated.

## 2016-01-05 ENCOUNTER — Encounter (HOSPITAL_COMMUNITY): Payer: Self-pay

## 2016-01-05 ENCOUNTER — Ambulatory Visit (HOSPITAL_COMMUNITY): Payer: Medicare Other

## 2016-01-05 DIAGNOSIS — R29898 Other symptoms and signs involving the musculoskeletal system: Secondary | ICD-10-CM | POA: Diagnosis not present

## 2016-01-05 DIAGNOSIS — M25511 Pain in right shoulder: Secondary | ICD-10-CM | POA: Diagnosis not present

## 2016-01-05 NOTE — Therapy (Signed)
[place], [place], [address] Phone: [phone]   Fax:  [phone]  Occupational Therapy Treatment  Patient Details  Name: Christina Carroll MRN: [id number] Date of Birth: Jun 09, 1945 Referring Provider: Dr. Rodell Perna  Encounter Date: 01/05/2016      OT End of Session - 01/05/16 1056    Visit Number 4   Number of Visits 12   Date for OT Re-Evaluation 02/03/16   Authorization Type Medicare A & B   Authorization Time Period Before 10th visit   Authorization - Visit Number 4   Authorization - Number of Visits 10   OT Start Time [phone]   OT Stop Time 1030   OT Time Calculation (min) 42 min   Activity Tolerance Patient tolerated treatment well   Behavior During Therapy [facility] for tasks assessed/performed      Past Medical History:  Diagnosis Date  . Arthritis   . Essential hypertension   . Hyperlipidemia   . Lymphocytic thyroiditis   . Obesity   . Prediabetes     Past Surgical History:  Procedure Laterality Date  . Bilateral foot surgery bone removed  2000  . COLONOSCOPY  11/30/2011   Procedure: COLONOSCOPY;  Surgeon: Rogene Houston, MD;  [facility];  Service: Endoscopy;  Laterality: N/A;  830  . Lumpectomy removed from thoracic spine area  1999    There were no vitals filed for this visit.      Subjective Assessment - 01/05/16 0951    Subjective  S: I didn't get a chance to try those new exercises. But my arm is moving a lot better.    Currently in Pain? No/denies            [facility] OT Assessment - 01/05/16 1006      Assessment   Diagnosis Right shoulder pain/impingement     Precautions   Precautions None                  OT Treatments/Exercises (OP) - 01/05/16 1003      Exercises   Exercises Shoulder     Shoulder Exercises: Supine   Protraction PROM;AAROM;10 reps   Horizontal ABduction PROM;AAROM;10 reps   External Rotation PROM;AAROM;10 reps   Internal Rotation PROM;AAROM;10 reps   Flexion PROM;AAROM;10 reps   ABduction PROM;AAROM;10 reps     Shoulder Exercises: Pulleys   Flexion 1 minute   ABduction 1 minute     Shoulder Exercises: ROM/Strengthening   Wall Wash 1'   Other ROM/Strengthening Exercises PVC pipe slide; 10X     Manual Therapy   Manual Therapy Myofascial release   Manual therapy comments manual therapy completed prior to exercises.   Myofascial Release Myofascial release and manual stretching completed to right upper arm, trapezius, and scapularis region to decrease fascial restrictions and increase joint mobility in a pain free zone.    Muscle Energy Technique --                  OT Short Term Goals - 12/28/15 1646      OT SHORT TERM GOAL #1   Title Pt will be provided with and educated on HEP.    Time 6   Period Weeks   Status On-going     OT SHORT TERM GOAL #2   Title Pt will decrease pain in RUE to 4/10 to improve ability to complete tasks using RUE as dominant.    Time 6   Period  Weeks   Status On-going     OT SHORT TERM GOAL #3   Title Pt will decrease RUE fascial restrictions to minimal amounts to improve mobility when performing reaching tasks.    Time 6   Period Weeks   Status On-going     OT SHORT TERM GOAL #4   Title Pt will increase A/ROM of RUE to [facility] to improve ability to perform overhead reaching tasks.    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    Time 6   Period Weeks   Status On-going                  Plan - 01/05/16 1057    Clinical Impression Statement A: Added PVC pipe slides, pulleys, and wall wash. patient required VC for form and technique although tolerated well. pt reported a catch in her shoulder during passive shoulder flexion and abduction.    Plan P: COntinue with AA/ROM exercises. Increase  repetitions when appropriate   OT Home Exercise Plan 10/5: table slides, ER stretch. 10/11: AA/ROM shoulder exercises.       Patient will benefit from skilled therapeutic intervention in order to improve the following deficits and impairments:  Impaired flexibility, Decreased strength, Pain, Decreased range of motion, Increased fascial restricitons, Impaired UE functional use  Visit Diagnosis: Other symptoms and signs involving the musculoskeletal system    Problem List Patient Active Problem List   Diagnosis Date Noted  . Tinea 09/03/2015  . Allergic conjunctivitis 09/03/2015  . Shoulder pain, bilateral 09/03/2015  . Metabolic syndrome X [id number]  . Skin lesion of breast 10/06/2013  . Benign pigmented mole 09/22/2013  . Seasonal allergies 07/10/2012  . Prediabetes 02/22/2012  . NEVUS 05/12/2010  . BACK PAIN 02/02/2010  . TENDINITIS 02/02/2010  . UNSPECIFIED DISORDER OF THYROID 08/30/2007  . Hyperlipidemia 08/30/2007  . Obesity 08/30/2007  . Essential hypertension 08/30/2007   Ailene Ravel, OTR/L,CBIS  [phone]  01/05/2016, 11:07 AM  [facility] [address] [place], [place], [address] Phone: [phone]   Fax:  [phone]  Name: JENNAVIE SALYARDS MRN: [id number] Date of Birth: [birth year]

## 2016-01-07 ENCOUNTER — Encounter (HOSPITAL_COMMUNITY): Payer: Self-pay

## 2016-01-07 ENCOUNTER — Ambulatory Visit (HOSPITAL_COMMUNITY): Payer: Medicare Other

## 2016-01-07 DIAGNOSIS — R29898 Other symptoms and signs involving the musculoskeletal system: Secondary | ICD-10-CM

## 2016-01-07 DIAGNOSIS — M25511 Pain in right shoulder: Secondary | ICD-10-CM | POA: Diagnosis not present

## 2016-01-07 NOTE — Therapy (Signed)
[place], [place], [address] Phone: [phone]   Fax:  [phone]  Occupational Therapy Treatment  Patient Details  Name: Christina Carroll MRN: [id number] Date of Birth: 06/07/1945 Referring Provider: Dr. Rodell Perna  Encounter Date: 01/07/2016      OT End of Session - 01/07/16 1022    Visit Number 5   Number of Visits 12   Date for OT Re-Evaluation 02/03/16   Authorization Type Medicare A & B   Authorization Time Period Before 10th visit   Authorization - Visit Number 5   Authorization - Number of Visits 10   OT Start Time [phone]   OT Stop Time 1030   OT Time Calculation (min) 40 min   Activity Tolerance Patient tolerated treatment well   Behavior During Therapy [facility] for tasks assessed/performed      Past Medical History:  Diagnosis Date  . Arthritis   . Essential hypertension   . Hyperlipidemia   . Lymphocytic thyroiditis   . Obesity   . Prediabetes     Past Surgical History:  Procedure Laterality Date  . Bilateral foot surgery bone removed  2000  . COLONOSCOPY  11/30/2011   Procedure: COLONOSCOPY;  Surgeon: Rogene Houston, MD;  [facility];  Service: Endoscopy;  Laterality: N/A;  830  . Lumpectomy removed from thoracic spine area  1999    There were no vitals filed for this visit.      Subjective Assessment - 01/07/16 1011    Currently in Pain? Yes   Pain Score 6    Pain Location Shoulder   Pain Orientation Right   Pain Descriptors / Indicators Aching   Pain Type Acute pain   Pain Radiating Towards N/A   Pain Onset More than a month ago   Pain Frequency Occasional   Aggravating Factors  movement   Pain Relieving Factors pain meds   Effect of Pain on Daily Activities None   Multiple Pain Sites No            [facility] OT Assessment - 01/07/16 1015      Assessment   Diagnosis Right shoulder pain/impingement     Precautions   Precautions None                   OT Treatments/Exercises (OP) - 01/07/16 1015      Exercises   Exercises Shoulder     Shoulder Exercises: Supine   Protraction PROM;AAROM;10 reps   Horizontal ABduction PROM;AAROM;10 reps   External Rotation PROM;AAROM;10 reps   Internal Rotation PROM;AAROM;10 reps   Flexion PROM;AAROM;10 reps   ABduction PROM;AAROM;10 reps     Shoulder Exercises: Standing   Protraction AAROM;10 reps   Horizontal ABduction AAROM;10 reps   External Rotation AAROM;10 reps   Internal Rotation AAROM;10 reps   Flexion AAROM;10 reps   ABduction AAROM;10 reps     Shoulder Exercises: Pulleys   Flexion 1 minute   ABduction 1 minute     Shoulder Exercises: ROM/Strengthening   Wall Wash 1'   Other ROM/Strengthening Exercises PVC pipe slide; 12X     Manual Therapy   Manual Therapy Myofascial release   Manual therapy comments manual therapy completed prior to exercises.   Myofascial Release Myofascial release and manual stretching completed to right upper arm, trapezius, and scapularis region to decrease fascial restrictions and increase joint mobility in a pain free zone.  OT Short Term Goals - 12/28/15 1646      OT SHORT TERM GOAL #1   Title Pt will be provided with and educated on HEP.    Time 6   Period Weeks   Status On-going     OT SHORT TERM GOAL #2   Title Pt will decrease pain in RUE to 4/10 to improve ability to complete tasks using RUE as dominant.    Time 6   Period Weeks   Status On-going     OT SHORT TERM GOAL #3   Title Pt will decrease RUE fascial restrictions to minimal amounts to improve mobility when performing reaching tasks.    Time 6   Period Weeks   Status On-going     OT SHORT TERM GOAL #4   Title Pt will increase A/ROM of RUE to [facility] to improve ability to perform overhead reaching tasks.    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    Time 6   Period Weeks   Status On-going                  Plan - 01/07/16 1022    Clinical Impression Statement A: Pt able to tolerate more range of motion during passive stretching. Continues to show progress in therapy. VC for form and technique as needed.    Plan P: Add scapular theraband and increase repetitions with AA/ROM.       Patient will benefit from skilled therapeutic intervention in order to improve the following deficits and impairments:  Impaired flexibility, Decreased strength, Pain, Decreased range of motion, Increased fascial restricitons, Impaired UE functional use  Visit Diagnosis: Other symptoms and signs involving the musculoskeletal system  Acute pain of right shoulder    Problem List Patient Active Problem List   Diagnosis Date Noted  . Tinea 09/03/2015  . Allergic conjunctivitis 09/03/2015  . Shoulder pain, bilateral 09/03/2015  . Metabolic syndrome X [id number]  . Skin lesion of breast 10/06/2013  . Benign pigmented mole 09/22/2013  . Seasonal allergies 07/10/2012  . Prediabetes 02/22/2012  . NEVUS 05/12/2010  . BACK PAIN 02/02/2010  . TENDINITIS 02/02/2010  . UNSPECIFIED DISORDER OF THYROID 08/30/2007  . Hyperlipidemia 08/30/2007  . Obesity 08/30/2007  . Essential hypertension 08/30/2007   Christina Carroll, OTR/L,CBIS  [phone]  01/07/2016, 10:34 AM  [place] [address] [place], [place], [address] Phone: [phone]   Fax:  [phone]  Name: Christina Carroll MRN: [id number] Date of Birth: 1945/12/17

## 2016-01-12 ENCOUNTER — Encounter (HOSPITAL_COMMUNITY): Payer: Self-pay

## 2016-01-12 ENCOUNTER — Ambulatory Visit (HOSPITAL_COMMUNITY): Payer: Medicare Other

## 2016-01-12 DIAGNOSIS — M25511 Pain in right shoulder: Secondary | ICD-10-CM | POA: Diagnosis not present

## 2016-01-12 DIAGNOSIS — R29898 Other symptoms and signs involving the musculoskeletal system: Secondary | ICD-10-CM | POA: Diagnosis not present

## 2016-01-12 NOTE — Therapy (Signed)
[place], [place], [address] Phone: [phone]   Fax:  [phone]  Occupational Therapy Treatment  Patient Details  Name: Christina Carroll MRN: [id number] Date of Birth: 12-28-1945 Referring Provider: Dr. Rodell Perna  Encounter Date: 01/12/2016      OT End of Session - 01/12/16 1026    Visit Number 6   Number of Visits 12   Date for OT Re-Evaluation 02/03/16   Authorization Type Medicare A & B   Authorization Time Period Before 10th visit   Authorization - Visit Number 6   Authorization - Number of Visits 10   OT Start Time [phone]   OT Stop Time 1030   OT Time Calculation (min) 40 min   Activity Tolerance Patient tolerated treatment well   Behavior During Therapy [facility] for tasks assessed/performed      Past Medical History:  Diagnosis Date  . Arthritis   . Essential hypertension   . Hyperlipidemia   . Lymphocytic thyroiditis   . Obesity   . Prediabetes     Past Surgical History:  Procedure Laterality Date  . Bilateral foot surgery bone removed  2000  . COLONOSCOPY  11/30/2011   Procedure: COLONOSCOPY;  Surgeon: Rogene Houston, MD;  [facility];  Service: Endoscopy;  Laterality: N/A;  830  . Lumpectomy removed from thoracic spine area  1999    There were no vitals filed for this visit.      Subjective Assessment - 01/12/16 1004    Subjective  S: I was a little sore yesterday because I had to pick up my grandchild a few times yesterday but that has worked itself out.    Currently in Pain? No/denies            [facility] OT Assessment - 01/12/16 1005      Assessment   Diagnosis Right shoulder pain/impingement     Precautions   Precautions None                  OT Treatments/Exercises (OP) - 01/12/16 1005      Exercises   Exercises Shoulder     Shoulder Exercises: Supine   Protraction PROM;5 reps;AAROM;12 reps   Horizontal ABduction PROM;5 reps;AAROM;12  reps   External Rotation PROM;5 reps;AAROM;12 reps   Internal Rotation PROM;5 reps;AAROM;12 reps   Flexion PROM;5 reps;AAROM;12 reps   ABduction PROM;5 reps;AAROM;12 reps     Shoulder Exercises: Standing   Protraction AAROM;12 reps   Horizontal ABduction AAROM;12 reps   External Rotation AAROM;12 reps   Internal Rotation AAROM;12 reps   Flexion AAROM;12 reps   ABduction AAROM;12 reps   Extension Theraband;10 reps   Theraband Level (Shoulder Extension) Level 2 (Red)   Row Theraband;10 reps   Theraband Level (Shoulder Row) Level 2 (Red)   Retraction Theraband;10 reps   Theraband Level (Shoulder Retraction) Level 2 (Red)     Shoulder Exercises: ROM/Strengthening   Proximal Shoulder Strengthening, Supine 10X no rest breaks     Manual Therapy   Manual Therapy Myofascial release   Manual therapy comments manual therapy completed prior to exercises.   Myofascial Release Myofascial release and manual stretching completed to right upper arm, trapezius, and scapularis region to decrease fascial restrictions and increase joint mobility in a pain free zone.                   OT Short Term Goals - 12/28/15 1646  OT SHORT TERM GOAL #1   Title Pt will be provided with and educated on HEP.    Time 6   Period Weeks   Status On-going     OT SHORT TERM GOAL #2   Title Pt will decrease pain in RUE to 4/10 to improve ability to complete tasks using RUE as dominant.    Time 6   Period Weeks   Status On-going     OT SHORT TERM GOAL #3   Title Pt will decrease RUE fascial restrictions to minimal amounts to improve mobility when performing reaching tasks.    Time 6   Period Weeks   Status On-going     OT SHORT TERM GOAL #4   Title Pt will increase A/ROM of RUE to [facility] to improve ability to perform overhead reaching tasks.    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    Time 6   Period Weeks   Status On-going                  Plan - 01/12/16 1026    Clinical Impression Statement A: Added scapular theraband exercises with great form. Was able to tolerate more ROM during passive stretching.    Plan P: Add scapular theraband to HEP. Attempt A/ROM supine.       Patient will benefit from skilled therapeutic intervention in order to improve the following deficits and impairments:  Impaired flexibility, Decreased strength, Pain, Decreased range of motion, Increased fascial restricitons, Impaired UE functional use  Visit Diagnosis: Other symptoms and signs involving the musculoskeletal system    Problem List Patient Active Problem List   Diagnosis Date Noted  . Tinea 09/03/2015  . Allergic conjunctivitis 09/03/2015  . Shoulder pain, bilateral 09/03/2015  . Metabolic syndrome X [id number]  . Skin lesion of breast 10/06/2013  . Benign pigmented mole 09/22/2013  . Seasonal allergies 07/10/2012  . Prediabetes 02/22/2012  . NEVUS 05/12/2010  . BACK PAIN 02/02/2010  . TENDINITIS 02/02/2010  . UNSPECIFIED DISORDER OF THYROID 08/30/2007  . Hyperlipidemia 08/30/2007  . Obesity 08/30/2007  . Essential hypertension 08/30/2007   Ailene Ravel, OTR/L,CBIS  [phone]  01/12/2016, 10:30 AM  [place] [address] [place], [place], [address] Phone: [phone]   Fax:  [phone]  Name: Christina Carroll MRN: [id number] Date of Birth: Jun 23, 1945

## 2016-01-14 ENCOUNTER — Encounter (HOSPITAL_COMMUNITY): Payer: Self-pay

## 2016-01-14 ENCOUNTER — Ambulatory Visit (HOSPITAL_COMMUNITY): Payer: Medicare Other

## 2016-01-14 DIAGNOSIS — M25511 Pain in right shoulder: Secondary | ICD-10-CM | POA: Diagnosis not present

## 2016-01-14 DIAGNOSIS — R29898 Other symptoms and signs involving the musculoskeletal system: Secondary | ICD-10-CM

## 2016-01-14 NOTE — Therapy (Signed)
[place], [place], [address] Phone: [phone]   Fax:  [phone]  Occupational Therapy Treatment  Patient Details  Name: Christina Carroll MRN: [id number] Date of Birth: 28-Dec-1945 Referring Provider: Dr. Rodell Perna  Encounter Date: 01/14/2016      OT End of Session - 01/14/16 1002    Visit Number 7   Number of Visits 12   Date for OT Re-Evaluation 02/03/16   Authorization Type Medicare A & B   Authorization Time Period Before 10th visit   Authorization - Visit Number 7   Authorization - Number of Visits 10   OT Start Time [phone]   OT Stop Time 1030   OT Time Calculation (min) 40 min   Activity Tolerance Patient tolerated treatment well   Behavior During Therapy [facility] for tasks assessed/performed      Past Medical History:  Diagnosis Date  . Arthritis   . Essential hypertension   . Hyperlipidemia   . Lymphocytic thyroiditis   . Obesity   . Prediabetes     Past Surgical History:  Procedure Laterality Date  . Bilateral foot surgery bone removed  2000  . COLONOSCOPY  11/30/2011   Procedure: COLONOSCOPY;  Surgeon: Rogene Houston, MD;  [facility];  Service: Endoscopy;  Laterality: N/A;  830  . Lumpectomy removed from thoracic spine area  1999    There were no vitals filed for this visit.      Subjective Assessment - 01/14/16 1000    Subjective  S: It's so much better. it only hurts if I move it suddenly or I'm really stretching it.    Currently in Pain? No/denies            [facility] OT Assessment - 01/14/16 1001      Assessment   Diagnosis Right shoulder pain/impingement     Precautions   Precautions None                  OT Treatments/Exercises (OP) - 01/14/16 1001      Exercises   Exercises Shoulder     Shoulder Exercises: Supine   Protraction PROM;5 reps;AROM;12 reps   Horizontal ABduction PROM;5 reps;AROM;12 reps   External Rotation PROM;5  reps;AROM;10 reps   Internal Rotation PROM;5 reps;AROM;10 reps   Flexion PROM;5 reps;AROM;10 reps   ABduction PROM;5 reps;AROM;10 reps     Shoulder Exercises: Standing   Protraction AROM;10 reps   Horizontal ABduction AROM;10 reps   External Rotation AROM;10 reps   Internal Rotation AROM;10 reps   Flexion AROM;10 reps   ABduction AROM;10 reps   Extension Theraband;10 reps   Theraband Level (Shoulder Extension) Level 2 (Red)   Row Theraband;10 reps   Theraband Level (Shoulder Row) Level 2 (Red)   Retraction Theraband;10 reps   Theraband Level (Shoulder Retraction) Level 2 (Red)     Shoulder Exercises: ROM/Strengthening   UBE (Upper Arm Bike) Level 1 2' forward 2' reverse   Proximal Shoulder Strengthening, Supine 10X no rest breaks     Manual Therapy   Manual Therapy Myofascial release   Manual therapy comments manual therapy completed prior to exercises.   Myofascial Release Myofascial release and manual stretching completed to right upper arm, trapezius, and scapularis region to decrease fascial restrictions and increase joint mobility in a pain free zone.                 OT Education - 01/14/16 1028    Education  provided Yes   Education Details Added scapular theraband strengthening with red band   Person(s) Educated Patient   Methods Explanation;Demonstration;Verbal cues;Handout   Comprehension Returned demonstration;Verbalized understanding          OT Short Term Goals - 12/28/15 1646      OT SHORT TERM GOAL #1   Title Pt will be provided with and educated on HEP.    Time 6   Period Weeks   Status On-going     OT SHORT TERM GOAL #2   Title Pt will decrease pain in RUE to 4/10 to improve ability to complete tasks using RUE as dominant.    Time 6   Period Weeks   Status On-going     OT SHORT TERM GOAL #3   Title Pt will decrease RUE fascial restrictions to minimal amounts to improve mobility when performing reaching tasks.    Time 6   Period Weeks    Status On-going     OT SHORT TERM GOAL #4   Title Pt will increase A/ROM of RUE to [facility] to improve ability to perform overhead reaching tasks.    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    Time 6   Period Weeks   Status On-going                  Plan - 01/14/16 1029    Clinical Impression Statement A: Progressed to A/ROM supine and standing. Upgraded HEP to include scapular strengthening. VC for form and technique.    Plan P: Add proximal shoulder strengthening standing and X to V arms.   OT Home Exercise Plan 10/5: table slides, ER stretch. 10/11: AA/ROM shoulder exercises. 10/27: Scapular theraband - red      Patient will benefit from skilled therapeutic intervention in order to improve the following deficits and impairments:  Impaired flexibility, Decreased strength, Pain, Decreased range of motion, Increased fascial restricitons, Impaired UE functional use  Visit Diagnosis: Other symptoms and signs involving the musculoskeletal system    Problem List Patient Active Problem List   Diagnosis Date Noted  . Tinea 09/03/2015  . Allergic conjunctivitis 09/03/2015  . Shoulder pain, bilateral 09/03/2015  . Metabolic syndrome X [id number]  . Skin lesion of breast 10/06/2013  . Benign pigmented mole 09/22/2013  . Seasonal allergies 07/10/2012  . Prediabetes 02/22/2012  . NEVUS 05/12/2010  . BACK PAIN 02/02/2010  . TENDINITIS 02/02/2010  . UNSPECIFIED DISORDER OF THYROID 08/30/2007  . Hyperlipidemia 08/30/2007  . Obesity 08/30/2007  . Essential hypertension 08/30/2007   Ailene Ravel, OTR/L,CBIS  [phone]  01/14/2016, 10:34 AM  [facility] [place] [address] [place], [place], [address] Phone: [phone]   Fax:  [phone]  Name: Christina Carroll MRN:  [id number] Date of Birth: 1945-11-30

## 2016-01-14 NOTE — Patient Instructions (Signed)

## 2016-01-19 ENCOUNTER — Encounter (HOSPITAL_COMMUNITY): Payer: Self-pay | Admitting: Occupational Therapy

## 2016-01-19 ENCOUNTER — Ambulatory Visit (HOSPITAL_COMMUNITY): Payer: Medicare Other | Attending: Orthopaedic Surgery | Admitting: Occupational Therapy

## 2016-01-19 DIAGNOSIS — M25511 Pain in right shoulder: Secondary | ICD-10-CM | POA: Insufficient documentation

## 2016-01-19 DIAGNOSIS — R29898 Other symptoms and signs involving the musculoskeletal system: Secondary | ICD-10-CM | POA: Insufficient documentation

## 2016-01-19 NOTE — Therapy (Signed)
[place], [place], [address] Phone: [phone]   Fax:  [phone]  Occupational Therapy Treatment  Patient Details  Name: Christina Carroll MRN: [id number] Date of Birth: [birth year] Referring Provider: Dr. Rodell Perna  Encounter Date: 01/19/2016      OT End of Session - 01/19/16 1143    Visit Number 8   Number of Visits 12   Date for OT Re-Evaluation 02/03/16   Authorization Type Medicare A & B   Authorization Time Period Before 10th visit   Authorization - Visit Number 8   Authorization - Number of Visits 10   OT Start Time [phone]   OT Stop Time 1030   OT Time Calculation (min) 43 min   Activity Tolerance Patient tolerated treatment well   Behavior During Therapy [facility] for tasks assessed/performed      Past Medical History:  Diagnosis Date  . Arthritis   . Essential hypertension   . Hyperlipidemia   . Lymphocytic thyroiditis   . Obesity   . Prediabetes     Past Surgical History:  Procedure Laterality Date  . Bilateral foot surgery bone removed  2000  . COLONOSCOPY  11/30/2011   Procedure: COLONOSCOPY;  Surgeon: Rogene Houston, MD;  [facility];  Service: Endoscopy;  Laterality: N/A;  830  . Lumpectomy removed from thoracic spine area  1999    There were no vitals filed for this visit.      Subjective Assessment - 01/19/16 0947    Subjective  S: I washed my back with a brush the other day and had my arm up pretty high.    Currently in Pain? No/denies            [facility] OT Assessment - 01/19/16 0947      Assessment   Diagnosis Right shoulder pain/impingement     Precautions   Precautions None                  OT Treatments/Exercises (OP) - 01/19/16 0949      Exercises   Exercises Shoulder     Shoulder Exercises: Supine   Protraction PROM;5 reps;AROM;12 reps   Horizontal ABduction PROM;5 reps;AROM;12 reps   External Rotation PROM;5 reps;AROM;10 reps    Internal Rotation PROM;5 reps;AROM;10 reps   Flexion PROM;5 reps;AROM;10 reps   ABduction PROM;5 reps;AROM;10 reps     Shoulder Exercises: Standing   Protraction AROM;10 reps   Horizontal ABduction AROM;10 reps   External Rotation AROM;10 reps   Internal Rotation AROM;10 reps   Flexion AROM;10 reps   ABduction AROM;10 reps     Shoulder Exercises: ROM/Strengthening   UBE (Upper Arm Bike) Level 1 2' forward 2' reverse   X to V Arms 10X   Proximal Shoulder Strengthening, Supine 10X no rest breaks   Proximal Shoulder Strengthening, Seated 10X each no rest breaks     Manual Therapy   Manual Therapy Myofascial release   Manual therapy comments manual therapy completed prior to exercises.   Myofascial Release Myofascial release and manual stretching completed to right upper arm, trapezius, and scapularis region to decrease fascial restrictions and increase joint mobility in a pain free zone.                 OT Education - 01/19/16 1143    Education provided Yes   Education Details A/ROM exercises   Person(s) Educated Patient   Methods Explanation;Demonstration;Handout   Comprehension Verbalized understanding;Returned demonstration  OT Short Term Goals - 12/28/15 1646      OT SHORT TERM GOAL #1   Title Pt will be provided with and educated on HEP.    Time 6   Period Weeks   Status On-going     OT SHORT TERM GOAL #2   Title Pt will decrease pain in RUE to 4/10 to improve ability to complete tasks using RUE as dominant.    Time 6   Period Weeks   Status On-going     OT SHORT TERM GOAL #3   Title Pt will decrease RUE fascial restrictions to minimal amounts to improve mobility when performing reaching tasks.    Time 6   Period Weeks   Status On-going     OT SHORT TERM GOAL #4   Title Pt will increase A/ROM of RUE to [facility] to improve ability to perform overhead reaching tasks.    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    Time 6   Period Weeks   Status On-going                  Plan - 01/19/16 1143    Clinical Impression Statement A: Continued A/ROM supine and standing, added x to v arms and standing proximal shoulder strengthening. Intermittent verbal cuing required for form and technique. Pt reports scapular theraband exercises are going well, updated HEP for A/ROM this session.    Plan P: Update G-Code, add overhead lacing   OT Home Exercise Plan 10/5: table slides, ER stretch. 10/11: AA/ROM shoulder exercises. 10/27: Scapular theraband - red; 11/1: A/ROM exercises   Consulted and Agree with Plan of Care Patient      Patient will benefit from skilled therapeutic intervention in order to improve the following deficits and impairments:  Impaired flexibility, Decreased strength, Pain, Decreased range of motion, Increased fascial restricitons, Impaired UE functional use  Visit Diagnosis: Other symptoms and signs involving the musculoskeletal system  Acute pain of right shoulder    Problem List Patient Active Problem List   Diagnosis Date Noted  . Tinea 09/03/2015  . Allergic conjunctivitis 09/03/2015  . Shoulder pain, bilateral 09/03/2015  . Metabolic syndrome X [id number]  . Skin lesion of breast 10/06/2013  . Benign pigmented mole 09/22/2013  . Seasonal allergies 07/10/2012  . Prediabetes 02/22/2012  . NEVUS 05/12/2010  . BACK PAIN 02/02/2010  . TENDINITIS 02/02/2010  . UNSPECIFIED DISORDER OF THYROID 08/30/2007  . Hyperlipidemia 08/30/2007  . Obesity 08/30/2007  . Essential hypertension 08/30/2007   Guadelupe Sabin, OTR/L  [phone] 01/19/2016, 11:46 AM  [facility] [address] [place], [place], [address] Phone: [phone]   Fax:  [phone]  Name: Christina Carroll MRN: [id number] Date of Birth:  1946-02-17

## 2016-01-19 NOTE — Patient Instructions (Signed)

## 2016-01-20 ENCOUNTER — Ambulatory Visit (INDEPENDENT_AMBULATORY_CARE_PROVIDER_SITE_OTHER): Payer: Medicare Other | Admitting: Orthopaedic Surgery

## 2016-01-20 ENCOUNTER — Encounter (INDEPENDENT_AMBULATORY_CARE_PROVIDER_SITE_OTHER): Payer: Self-pay | Admitting: Orthopaedic Surgery

## 2016-01-20 VITALS — BP 109/77 | HR 77 | Ht 65.0 in | Wt 190.0 lb

## 2016-01-20 DIAGNOSIS — G8929 Other chronic pain: Secondary | ICD-10-CM

## 2016-01-20 DIAGNOSIS — M25512 Pain in left shoulder: Secondary | ICD-10-CM

## 2016-01-20 DIAGNOSIS — M25511 Pain in right shoulder: Secondary | ICD-10-CM | POA: Diagnosis not present

## 2016-01-20 NOTE — Progress Notes (Signed)
Office Visit Note   Patient: Christina Carroll           Date of Birth: [birth year]           MRN: [id number] Visit Date: 01/20/2016              Requested by: Fayrene Helper, MD [address], [place], [place] [address] PCP: Tula Nakayama, MD   Assessment & Plan: Visit Diagnoses:  1. Chronic pain of both shoulders   Right shoulder is more symptomatic. She has loose bodies chondromalacia and partial rotator cuff tearing but no full-thickness tear.  Plan: Continue physical therapy she started gotten some benefit from this. I'll recheck her again in one month. We reviewed images from her MRI scan as well as report once again and discussed the pathophysiology of her condition and outlined treatment plan.  Follow-Up Instructions: Return in about 1 month (around 02/19/2016).   Orders:  No orders of the defined types were placed in this encounter.  No orders of the defined types were placed in this encounter.     Procedures: No procedures performed   Clinical Data: No additional findings.   Subjective: Chief Complaint  Patient presents with  . Right Shoulder - Follow-up    Patient returns for follow up right shoulder tendinitis. She states that her ROM is improving and that the shoulder is feeling better. She is attending PT at [facility].  The pain level is about 1 out of 10.  Patient states that her shoulder range of motion is improved she's not had any true locking. Still bothers her at the end of the day and sometimes at night. 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 One is large and is in the axillary recess. Chest is associated tendinosis. No full-thickness tear.  Review of Systems   Objective: Vital Signs: BP 109/77   Pulse 77   Ht 5\' 5"  (1.651 m)   Wt 190 lb (86.2 kg)   BMI 31.62 kg/m   Physical Exam  Constitutional: She is oriented to person, place, and time. She appears well-developed.  HENT:    Head: Normocephalic.  Right Ear: External ear normal.  Left Ear: External ear normal.  Patient has bilateral hearing aids  Eyes: Pupils are equal, round, and reactive to light.  Neck: No tracheal deviation present. No thyromegaly present.  Cardiovascular: Normal rate.   Pulmonary/Chest: Effort normal.  Abdominal: Soft.  Neurological: She is alert and oriented to person, place, and time.  Skin: Skin is warm and dry.  Psychiatric: She has a normal mood and affect. Her behavior is normal.    Ortho Exam patient has no brachial plexus tenderness. Flexion-extension cervical spine normal rotation negative Spurling. Long head of the biceps is nontender right and left. Left arm she can get overhead easily on the right she lacks 20 of flexion 30 of abduction. Reflexes are 2+ and symmetrical normal heel toe gait no lower extremity hyperreflexia no rash or exposed skin. Biceps triceps was flexion-extension finger flexors extensors are normal to testing normal sensation. No tenderness over the median nerve in the forearm right the wrist ulnar nerve at the elbow is normal.  Specialty Comments:  No specialty comments available.  Imaging: No results found. MR Shoulder Right Wo Contrast (Accession [id number]) (Order [id number])  Imaging  Date: 12/15/2015 Department: [facility] Released By: Alanda Slim Authorizing: Marybelle Killings, MD  Exam Information   Status Exam  Begun  Exam Ended   Final [99] 12/15/2015 8:24 AM 12/15/2015 8:45 AM  PACS Images   Show images for MR Shoulder Right Wo Contrast  Study Result   CLINICAL DATA:  Right shoulder pain for 2 months.  No known injury.  EXAM: MRI OF THE RIGHT SHOULDER WITHOUT CONTRAST  TECHNIQUE: Multiplanar, multisequence MR imaging of the shoulder was performed. No intravenous contrast was administered.  COMPARISON:  None.  FINDINGS: Rotator cuff: Mild tendinosis of the supraspinatus tendon. Severe tendinosis of the infraspinatus  tendon. Teres minor tendon is intact. Subscapularis tendon is intact.  Muscles: No atrophy or fatty replacement of nor abnormal signal within, the muscles of the rotator cuff.  Biceps long head:  Intact.  Acromioclavicular Joint: Severe arthropathy of the acromioclavicular joint. Type I acromion. No subacromial/subdeltoid bursal fluid.  Glenohumeral Joint: Small joint effusion with mild synovitis. 6 mm loose body in the axillary recess. Partial-thickness cartilage loss of the glenohumeral joint.  Labrum: Grossly intact, but evaluation is limited by lack of intraarticular fluid.  Bones: Subcortical reactive marrow changes at the supraspinatus, infraspinatus and subscapularis insertion.  Other: No fluid collection or hematoma.  IMPRESSION: 1.  Mild tendinosis of the supraspinatus tendon. 2. Severe tendinosis of the infraspinatus tendon. 3. Small joint effusion with mild synovitis. 6 mm loose body in the axillary recess. 4. Partial-thickness cartilage loss of the glenohumeral joint consistent with mild osteoarthritis.   Electronically Signed   By: Kathreen Devoid   On: 12/15/2015 09:26      PMFS History: Patient Active Problem List   Diagnosis Date Noted  . Tinea 09/03/2015  . Allergic conjunctivitis 09/03/2015  . Shoulder pain, bilateral 09/03/2015  . Metabolic syndrome X [id number]  . Skin lesion of breast 10/06/2013  . Benign pigmented mole 09/22/2013  . Seasonal allergies 07/10/2012  . Prediabetes 02/22/2012  . NEVUS 05/12/2010  . BACK PAIN 02/02/2010  . TENDINITIS 02/02/2010  . UNSPECIFIED DISORDER OF THYROID 08/30/2007  . Hyperlipidemia 08/30/2007  . Obesity 08/30/2007  . Essential hypertension 08/30/2007   Past Medical History:  Diagnosis Date  . Arthritis   . Essential hypertension   . Hyperlipidemia   . Lymphocytic thyroiditis   . Obesity   . Prediabetes     Family History  Problem Relation Age of Onset  . Stroke Mother   . Diabetes  Mother   . Hypertension Mother   . Coronary artery disease Father   . Arthritis    . Cancer    . Breast cancer Maternal Aunt     Past Surgical History:  Procedure Laterality Date  . Bilateral foot surgery bone removed  2000  . COLONOSCOPY  11/30/2011   Procedure: COLONOSCOPY;  Surgeon: Rogene Houston, MD;  [facility];  Service: Endoscopy;  Laterality: N/A;  830  . Lumpectomy removed from thoracic spine area  1999   Social History   Occupational History  . Employed     Social History Main Topics  . Smoking status: Never Smoker  . Smokeless tobacco: Never Used  . Alcohol use No  . Drug use: No  . Sexual activity: Not Currently

## 2016-01-21 ENCOUNTER — Encounter (HOSPITAL_COMMUNITY): Payer: Self-pay | Admitting: Occupational Therapy

## 2016-01-21 ENCOUNTER — Ambulatory Visit (HOSPITAL_COMMUNITY): Payer: Medicare Other | Admitting: Occupational Therapy

## 2016-01-21 DIAGNOSIS — R29898 Other symptoms and signs involving the musculoskeletal system: Secondary | ICD-10-CM | POA: Diagnosis not present

## 2016-01-21 DIAGNOSIS — M25511 Pain in right shoulder: Secondary | ICD-10-CM | POA: Diagnosis not present

## 2016-01-21 NOTE — Therapy (Addendum)
[place], [place], [address] Phone: [phone]   Fax:  [phone]  Occupational Therapy Treatment  Patient Details  Name: Christina Carroll MRN: [id number] Date of Birth: 1945-11-28 Referring Provider: Dr. Rodell Perna  Encounter Date: 01/21/2016      OT End of Session - 01/21/16 1052    Visit Number 9   Number of Visits 12   Date for OT Re-Evaluation 02/03/16   Authorization Type Medicare A & B   Authorization Time Period Before 19th visit   Authorization - Visit Number 9   Authorization - Number of Visits 19   OT Start Time [phone]   OT Stop Time 1029   OT Time Calculation (min) 43 min   Activity Tolerance Patient tolerated treatment well   Behavior During Therapy [facility] for tasks assessed/performed      Past Medical History:  Diagnosis Date  . Arthritis   . Essential hypertension   . Hyperlipidemia   . Lymphocytic thyroiditis   . Obesity   . Prediabetes     Past Surgical History:  Procedure Laterality Date  . Bilateral foot surgery bone removed  2000  . COLONOSCOPY  11/30/2011   Procedure: COLONOSCOPY;  Surgeon: Rogene Houston, MD;  [facility];  Service: Endoscopy;  Laterality: N/A;  830  . Lumpectomy removed from thoracic spine area  1999    There were no vitals filed for this visit.      Subjective Assessment - 01/21/16 0948    Subjective  S: I saw Dr. Lorin Mercy yesterday and he thought it was doing good.    Currently in Pain? No/denies            [facility] OT Assessment - 01/21/16 0948      Assessment   Diagnosis Right shoulder pain/impingement     Precautions   Precautions None                  OT Treatments/Exercises (OP) - 01/21/16 0949      Exercises   Exercises Shoulder     Shoulder Exercises: Supine   Protraction PROM;5 reps;AROM;12 reps   Horizontal ABduction PROM;5 reps;AROM;12 reps   External Rotation PROM;5 reps;AROM;10 reps   Internal  Rotation PROM;5 reps;AROM;10 reps   Flexion PROM;5 reps;AROM;10 reps   ABduction PROM;5 reps;AROM;10 reps     Shoulder Exercises: Standing   Protraction AROM;10 reps   Horizontal ABduction AROM;10 reps   External Rotation AROM;10 reps   Internal Rotation AROM;10 reps   Flexion AROM;10 reps   ABduction AROM;10 reps   Extension Theraband;12 reps   Theraband Level (Shoulder Extension) Level 2 (Red)   Row Theraband;12 reps   Theraband Level (Shoulder Row) Level 2 (Red)   Retraction Theraband;12 reps   Theraband Level (Shoulder Retraction) Level 2 (Red)     Shoulder Exercises: ROM/Strengthening   Over Head Lace 1'   Proximal Shoulder Strengthening, Supine 12X no rest breaks   Proximal Shoulder Strengthening, Seated 10X each no rest breaks     Manual Therapy   Manual Therapy Myofascial release   Manual therapy comments manual therapy completed prior to exercises.   Myofascial Release Myofascial release and manual stretching completed to right upper arm, trapezius, and scapularis region to decrease fascial restrictions and increase joint mobility in a pain free zone.                   OT Short Term Goals - 12/28/15 1646  OT SHORT TERM GOAL #1   Title Pt will be provided with and educated on HEP.    Time 6   Period Weeks   Status On-going     OT SHORT TERM GOAL #2   Title Pt will decrease pain in RUE to 4/10 to improve ability to complete tasks using RUE as dominant.    Time 6   Period Weeks   Status On-going     OT SHORT TERM GOAL #3   Title Pt will decrease RUE fascial restrictions to minimal amounts to improve mobility when performing reaching tasks.    Time 6   Period Weeks   Status On-going     OT SHORT TERM GOAL #4   Title Pt will increase A/ROM of RUE to [facility] to improve ability to perform overhead reaching tasks.    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    Time 6   Period Weeks   Status On-going                  Plan - 2016/01/25 1052    Clinical Impression Statement A: Added overhead lacing this session. Pt reports soreness last night and today after sitting in meetings for a long time yesterday. Pt with increased tightness in upper trapezius and scapularis regions. Verbal cuing intermittently for form and technqiue.    Plan P: Follow up on soreness over weekend, increase repetitions to 15 if able to tolerate. Add w arms.    OT Home Exercise Plan 10/5: table slides, ER stretch. 10/11: AA/ROM shoulder exercises. 10/27: Scapular theraband - red; 11/1: A/ROM exercises      Patient will benefit from skilled therapeutic intervention in order to improve the following deficits and impairments:  Impaired flexibility, Decreased strength, Pain, Decreased range of motion, Increased fascial restricitons, Impaired UE functional use  Visit Diagnosis: Other symptoms and signs involving the musculoskeletal system  Acute pain of right shoulder      G-Codes - 01-25-2016 1252    Functional Assessment Tool Used FOTO Score: 63/100 (37% impairment)   Functional Limitation Carrying, moving and handling objects   Carrying, Moving and Handling Objects Current Status [id number]) At least 20 percent but less than 40 percent impaired, limited or restricted   Carrying, Moving and Handling Objects Goal Status [id number]) At least 1 percent but less than 20 percent impaired, limited or restricted      Problem List Patient Active Problem List   Diagnosis Date Noted  . Tinea 09/03/2015  . Allergic conjunctivitis 09/03/2015  . Shoulder pain, bilateral 09/03/2015  . Metabolic syndrome X [id number]  . Skin lesion of breast 10/06/2013  . Benign pigmented mole 09/22/2013  . Seasonal allergies 07/10/2012  . Prediabetes 02/22/2012  . NEVUS 05/12/2010  . BACK PAIN 02/02/2010  . TENDINITIS 02/02/2010  .  UNSPECIFIED DISORDER OF THYROID 08/30/2007  . Hyperlipidemia 08/30/2007  . Obesity 08/30/2007  . Essential hypertension 08/30/2007   Guadelupe Sabin, OTR/L  [phone] 2016/01/25, 12:53 PM  [facility] [address] [place], [place], [address] Phone: [phone]   Fax:  [phone]  Name: Christina Carroll MRN: [id number] Date of Birth: [birth year]

## 2016-01-24 ENCOUNTER — Telehealth (HOSPITAL_COMMUNITY): Payer: Self-pay | Admitting: Family Medicine

## 2016-01-24 NOTE — Telephone Encounter (Signed)
1/6 had an appt for 11/8 and need tor eschedule she will be out of town

## 2016-01-26 ENCOUNTER — Encounter (HOSPITAL_COMMUNITY): Payer: Medicare Other | Admitting: Occupational Therapy

## 2016-01-27 ENCOUNTER — Encounter (HOSPITAL_COMMUNITY): Payer: Self-pay | Admitting: Occupational Therapy

## 2016-01-27 ENCOUNTER — Ambulatory Visit (HOSPITAL_COMMUNITY): Payer: Medicare Other | Admitting: Occupational Therapy

## 2016-01-27 DIAGNOSIS — M25511 Pain in right shoulder: Secondary | ICD-10-CM

## 2016-01-27 DIAGNOSIS — R29898 Other symptoms and signs involving the musculoskeletal system: Secondary | ICD-10-CM

## 2016-01-27 NOTE — Therapy (Signed)
[place], [place], [address] Phone: [phone]   Fax:  [phone]  Occupational Therapy Treatment  Patient Details  Name: Christina Carroll MRN: [id number] Date of Birth: 1945-11-09 Referring Provider: Dr. Rodell Perna  Encounter Date: 01/27/2016      OT End of Session - 01/27/16 1200    Visit Number 10   Number of Visits 12   Date for OT Re-Evaluation 02/03/16   Authorization Type Medicare A & B   Authorization Time Period Before 19th visit   Authorization - Visit Number 10   Authorization - Number of Visits 19   OT Start Time 1119   OT Stop Time 1159   OT Time Calculation (min) 40 min   Activity Tolerance Patient tolerated treatment well   Behavior During Therapy [facility] for tasks assessed/performed      Past Medical History:  Diagnosis Date  . Arthritis   . Essential hypertension   . Hyperlipidemia   . Lymphocytic thyroiditis   . Obesity   . Prediabetes     Past Surgical History:  Procedure Laterality Date  . Bilateral foot surgery bone removed  2000  . COLONOSCOPY  11/30/2011   Procedure: COLONOSCOPY;  Surgeon: Rogene Houston, MD;  [facility];  Service: Endoscopy;  Laterality: N/A;  830  . Lumpectomy removed from thoracic spine area  1999    There were no vitals filed for this visit.      Subjective Assessment - 01/27/16 1120    Subjective  S: I had a time with this shoulder this weekend.    Currently in Pain? Yes   Pain Score 1    Pain Location Shoulder   Pain Orientation Right   Pain Descriptors / Indicators Aching   Pain Type Acute pain   Pain Radiating Towards n/a   Pain Onset More than a month ago   Pain Frequency Occasional   Aggravating Factors  movement   Pain Relieving Factors pain medications, heat   Effect of Pain on Daily Activities none   Multiple Pain Sites No            [facility] OT Assessment - 01/27/16 1119      Assessment   Diagnosis Right  shoulder pain/impingement     Precautions   Precautions None                  OT Treatments/Exercises (OP) - 01/27/16 1121      Exercises   Exercises Shoulder     Shoulder Exercises: Supine   Protraction PROM;5 reps;AROM;15 reps   Horizontal ABduction PROM;5 reps;AROM;15 reps   External Rotation PROM;5 reps;AROM;15 reps   Internal Rotation PROM;5 reps;AROM;15 reps   Flexion PROM;5 reps;AROM;15 reps   ABduction PROM;5 reps;AROM;15 reps     Shoulder Exercises: Standing   Protraction AROM;12 reps   Horizontal ABduction AROM;12 reps   External Rotation AROM;12 reps   Internal Rotation AROM;12 reps   Flexion AROM;12 reps   ABduction AROM;12 reps   Extension Theraband;12 reps   Theraband Level (Shoulder Extension) Level 2 (Red)   Row Theraband;12 reps   Theraband Level (Shoulder Row) Level 2 (Red)   Retraction Theraband;12 reps   Theraband Level (Shoulder Retraction) Level 2 (Red)     Shoulder Exercises: ROM/Strengthening   "W" Arms 5X   X to V Arms 10X  1 rest break   Proximal Shoulder Strengthening, Supine 12X no rest breaks   Proximal Shoulder Strengthening,  Seated 12X each no rest breaks     Manual Therapy   Manual Therapy Myofascial release   Manual therapy comments manual therapy completed prior to exercises.   Myofascial Release Myofascial release and manual stretching completed to right upper arm, trapezius, and scapularis region to decrease fascial restrictions and increase joint mobility in a pain free zone.                   OT Short Term Goals - 12/28/15 1646      OT SHORT TERM GOAL #1   Title Pt will be provided with and educated on HEP.    Time 6   Period Weeks   Status On-going     OT SHORT TERM GOAL #2   Title Pt will decrease pain in RUE to 4/10 to improve ability to complete tasks using RUE as dominant.    Time 6   Period Weeks   Status On-going     OT SHORT TERM GOAL #3   Title Pt will decrease RUE fascial restrictions to  minimal amounts to improve mobility when performing reaching tasks.    Time 6   Period Weeks   Status On-going     OT SHORT TERM GOAL #4   Title Pt will increase A/ROM of RUE to [facility] to improve ability to perform overhead reaching tasks.    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    Time 6   Period Weeks   Status On-going                  Plan - 01/27/16 1200    Clinical Impression Statement A: Added w arms, verbal and visual cuing for form. Pt reports pain over weekend, minimal soreness today. Increased A/ROM repetitions to 15 in supine. Pt required intermittent rest breaks due to fatigue during session. Pt reports HEP is going well.    Plan P: Increase standing repetitions to 15 if pt able to tolerate. Improve independence in form with scapular theraband   OT Home Exercise Plan 10/5: table slides, ER stretch. 10/11: AA/ROM shoulder exercises. 10/27: Scapular theraband - red; 11/1: A/ROM exercises      Patient will benefit from skilled therapeutic intervention in order to improve the following deficits and impairments:  Impaired flexibility, Decreased strength, Pain, Decreased range of motion, Increased fascial restricitons, Impaired UE functional use  Visit Diagnosis: Other symptoms and signs involving the musculoskeletal system  Acute pain of right shoulder    Problem List Patient Active Problem List   Diagnosis Date Noted  . Tinea 09/03/2015  . Allergic conjunctivitis 09/03/2015  . Shoulder pain, bilateral 09/03/2015  . Metabolic syndrome X [id number]  . Skin lesion of breast 10/06/2013  . Benign pigmented mole 09/22/2013  . Seasonal allergies 07/10/2012  . Prediabetes 02/22/2012  . NEVUS 05/12/2010  . BACK PAIN 02/02/2010  . TENDINITIS 02/02/2010  . UNSPECIFIED DISORDER OF THYROID 08/30/2007  . Hyperlipidemia  08/30/2007  . Obesity 08/30/2007  . Essential hypertension 08/30/2007   Guadelupe Sabin, OTR/L  [phone] 01/27/2016, 12:03 PM  [place] [address] [place], [place], [address] Phone: [phone]   Fax:  [phone]  Name: Christina Carroll MRN: [id number] Date of Birth: 06-17-1945

## 2016-01-28 ENCOUNTER — Encounter (HOSPITAL_COMMUNITY): Payer: Self-pay | Admitting: Occupational Therapy

## 2016-01-28 ENCOUNTER — Ambulatory Visit (HOSPITAL_COMMUNITY): Payer: Medicare Other | Admitting: Occupational Therapy

## 2016-01-28 DIAGNOSIS — M25511 Pain in right shoulder: Secondary | ICD-10-CM | POA: Diagnosis not present

## 2016-01-28 DIAGNOSIS — R29898 Other symptoms and signs involving the musculoskeletal system: Secondary | ICD-10-CM

## 2016-01-28 NOTE — Therapy (Signed)
Christina Carroll, [place], [address] Phone: [phone]   Fax:  [phone]  Occupational Therapy Treatment  Patient Details  Name: Christina Carroll MRN: [id number] Date of Birth: 1945/08/04 Referring Provider: Dr. Rodell Perna  Encounter Date: 01/28/2016      OT End of Session - 01/28/16 1114    Visit Number 11   Number of Visits 12   Date for OT Re-Evaluation 02/03/16   Authorization Type Medicare A & B   Authorization Time Period Before 19th visit   Authorization - Visit Number 11   Authorization - Number of Visits 19   OT Start Time [phone]   OT Stop Time 1032   OT Time Calculation (min) 43 min   Activity Tolerance Patient tolerated treatment well   Behavior During Therapy [facility] for tasks assessed/performed      Past Medical History:  Diagnosis Date  . Arthritis   . Essential hypertension   . Hyperlipidemia   . Lymphocytic thyroiditis   . Obesity   . Prediabetes     Past Surgical History:  Procedure Laterality Date  . Bilateral foot surgery bone removed  2000  . COLONOSCOPY  11/30/2011   Procedure: COLONOSCOPY;  Surgeon: Rogene Houston, MD;  [facility];  Service: Endoscopy;  Laterality: N/A;  830  . Lumpectomy removed from thoracic spine area  1999    There were no vitals filed for this visit.      Subjective Assessment - 01/28/16 0949    Subjective  S:    Currently in Pain? No/denies            [facility] OT Assessment - 01/28/16 0949      Assessment   Diagnosis Right shoulder pain/impingement     Precautions   Precautions None                  OT Treatments/Exercises (OP) - 01/28/16 0952      Exercises   Exercises Shoulder     Shoulder Exercises: Supine   Protraction PROM;5 reps;AROM;15 reps   Horizontal ABduction PROM;5 reps;AROM;15 reps   External Rotation PROM;5 reps;AROM;15 reps   Internal Rotation PROM;5 reps;AROM;15 reps   Flexion PROM;5  reps;AROM;15 reps   ABduction PROM;5 reps;AROM;15 reps     Shoulder Exercises: Sidelying   External Rotation AROM;10 reps   Internal Rotation AROM;10 reps   Flexion AROM  8 reps   ABduction AROM;10 reps   Other Sidelying Exercises Protraction, A/ROM 10X   Other Sidelying Exercises Horizontal abduction, A/ROM 10X     Shoulder Exercises: Standing   Protraction AROM;12 reps   Horizontal ABduction AROM;12 reps   External Rotation AROM;12 reps   Internal Rotation AROM;12 reps   Flexion AROM;12 reps   ABduction AROM;12 reps   Extension Theraband;12 reps   Theraband Level (Shoulder Extension) Level 2 (Red)   Row Theraband;12 reps   Theraband Level (Shoulder Row) Level 2 (Red)   Retraction Theraband;12 reps   Theraband Level (Shoulder Retraction) Level 2 (Red)     Shoulder Exercises: ROM/Strengthening   UBE (Upper Arm Bike) Level 1 2' forward 2' reverse   "W" Arms 5X   X to V Arms 10X   Proximal Shoulder Strengthening, Supine 12X no rest breaks   Proximal Shoulder Strengthening, Seated 12X each no rest breaks     Manual Therapy   Manual Therapy Myofascial release   Manual therapy comments manual therapy completed prior to exercises.  Myofascial Release Myofascial release and manual stretching completed to right upper arm, trapezius, and scapularis region to decrease fascial restrictions and increase joint mobility in a pain free zone.                   OT Short Term Goals - 12/28/15 1646      OT SHORT TERM GOAL #1   Title Pt will be provided with and educated on HEP.    Time 6   Period Weeks   Status On-going     OT SHORT TERM GOAL #2   Title Pt will decrease pain in RUE to 4/10 to improve ability to complete tasks using RUE as dominant.    Time 6   Period Weeks   Status On-going     OT SHORT TERM GOAL #3   Title Pt will decrease RUE fascial restrictions to minimal amounts to improve mobility when performing reaching tasks.    Time 6   Period Weeks    Status On-going     OT SHORT TERM GOAL #4   Title Pt will increase A/ROM of RUE to [facility] to improve ability to perform overhead reaching tasks.    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    Time 6   Period Weeks   Status On-going                  Plan - 01/28/16 1114    Clinical Impression Statement A: Added sidelying A/ROM, pt with pain during flexion. Pt able to complete A/ROM in standing with minimal pain in all planes. Pt with improved form during scapular theraband, verbal cuing for initial form.    Plan P: Continue working to improve independence in scapular theraband. Continue with A/ROM, add overhead lacing.    OT Home Exercise Plan 10/5: table slides, ER stretch. 10/11: AA/ROM shoulder exercises. 10/27: Scapular theraband - red; 11/1: A/ROM exercises      Patient will benefit from skilled therapeutic intervention in order to improve the following deficits and impairments:  Impaired flexibility, Decreased strength, Pain, Decreased range of motion, Increased fascial restricitons, Impaired UE functional use  Visit Diagnosis: Other symptoms and signs involving the musculoskeletal system  Acute pain of right shoulder    Problem List Patient Active Problem List   Diagnosis Date Noted  . Tinea 09/03/2015  . Allergic conjunctivitis 09/03/2015  . Shoulder pain, bilateral 09/03/2015  . Metabolic syndrome X [id number]  . Skin lesion of breast 10/06/2013  . Benign pigmented mole 09/22/2013  . Seasonal allergies 07/10/2012  . Prediabetes 02/22/2012  . NEVUS 05/12/2010  . BACK PAIN 02/02/2010  . TENDINITIS 02/02/2010  . UNSPECIFIED DISORDER OF THYROID 08/30/2007  . Hyperlipidemia 08/30/2007  . Obesity 08/30/2007  . Essential hypertension 08/30/2007   Christina Carroll, OTR/L  [phone] 01/28/2016, 11:17 AM  [place] [address] [place], [place], [address] Phone: [phone]   Fax:  [phone]  Name: Christina Carroll MRN: [id number] Date of Birth: 04/09/1945

## 2016-02-02 ENCOUNTER — Encounter (HOSPITAL_COMMUNITY): Payer: Self-pay | Admitting: Occupational Therapy

## 2016-02-02 ENCOUNTER — Ambulatory Visit (HOSPITAL_COMMUNITY): Payer: Medicare Other | Admitting: Occupational Therapy

## 2016-02-02 DIAGNOSIS — M25511 Pain in right shoulder: Secondary | ICD-10-CM | POA: Diagnosis not present

## 2016-02-02 DIAGNOSIS — R29898 Other symptoms and signs involving the musculoskeletal system: Secondary | ICD-10-CM | POA: Diagnosis not present

## 2016-02-02 NOTE — Patient Instructions (Signed)
  1) Flexion Wall Stretch    Face wall, place affected handon wall in front of you. Slide hand up the wall  and lean body in towards the wall. Hold for 5 seconds. Repeat 10 times. 1-2 times/day.     2) Towel Stretch with Internal Rotation   Gently pull up your affected arm  behind your back with the assist of a towel            3) Corner Stretch    Stand at a corner of a wall, place your arms on the walls with elbows bent. Lean into the corner until a stretch is felt along the front of your chest and/or shoulders. Hold for 5 seconds. Repeat 10X, 1-2 times/day.    4) Posterior Capsule Stretch    Bring the involved arm across chest. Grasp elbow and pull toward chest until you feel a stretch in the back of the upper arm and shoulder. Hold 5 seconds. Repeat 10X. Complete 1-2 times/day.       5) External Rotation Stretch:     Place your affected hand on the wall with the elbow bent and gently turn your body the opposite direction until a stretch is felt.      Strengthening: Chest Pull - Resisted   Hold Theraband in front of body with hands about shoulder width a part. Pull band a part and back together slowly. Repeat __10-15__ times. Complete _1___ set(s) per session.. Repeat _1_ session(s) per day.  http://orth.exer.us/926   Copyright  VHI. All rights reserved.   PNF Strengthening: Resisted   Standing with resistive band around each hand, bring right arm up and away, thumb back. Repeat _10-15___ times per set. Do __1__ sets per session. Do __1__ sessions per day.                           Resisted External Rotation: in Neutral - Bilateral   Sit or stand, tubing in both hands, elbows at sides, bent to 90, forearms forward. Pinch shoulder blades together and rotate forearms out. Keep elbows at sides. Repeat __1-2__ times per set. Do __1__ sets per session. Do ___1_ sessions per day.  http://orth.exer.us/966   Copyright   VHI. All rights reserved.   PNF Strengthening: Resisted   Standing, hold resistive band above head. Bring right arm down and out from side. Repeat _10-15___ times per set. Do _1___ sets per session. Do _1___ sessions per day.  http://orth.exer.us/922   Copyright  VHI. All rights reserved.

## 2016-02-02 NOTE — Therapy (Signed)
[place], [place], [address] Phone: [phone]   Fax:  [phone]  Occupational Therapy Reassessment, Treatment, Discharge  Patient Details  Name: Christina Carroll MRN: [id number] Date of Birth: 11-16-1945 Referring Provider: Dr. Rodell Perna  Encounter Date: 02/02/2016      OT End of Session - 02/02/16 1056    Visit Number 12   Number of Visits 12   Date for OT Re-Evaluation 02/03/16   Authorization Type Medicare A & B   Authorization Time Period Before 19th visit   Authorization - Visit Number 12   Authorization - Number of Visits 19   OT Start Time [phone]   OT Stop Time 1029   OT Time Calculation (min) 41 min   Activity Tolerance Patient tolerated treatment well   Behavior During Therapy [facility] for tasks assessed/performed      Past Medical History:  Diagnosis Date  . Arthritis   . Essential hypertension   . Hyperlipidemia   . Lymphocytic thyroiditis   . Obesity   . Prediabetes     Past Surgical History:  Procedure Laterality Date  . Bilateral foot surgery bone removed  2000  . COLONOSCOPY  11/30/2011   Procedure: COLONOSCOPY;  Surgeon: Rogene Houston, MD;  [facility];  Service: Endoscopy;  Laterality: N/A;  830  . Lumpectomy removed from thoracic spine area  1999    There were no vitals filed for this visit.      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    Currently in Pain? No/denies           [facility] OT Assessment - 02/02/16 0949      Assessment   Diagnosis Right shoulder pain/impingement     Precautions   Precautions None     AROM   Overall AROM Comments Assessed seated, er/IR adducted   AROM Assessment Site Shoulder   Right/Left Shoulder Right   Right Shoulder Flexion 160 Degrees  139 previous   Right Shoulder ABduction 165 Degrees  94 previous   Right Shoulder Internal Rotation 90 Degrees  same as  previous   Right Shoulder External Rotation 80 Degrees  46 previous     PROM   Overall PROM Comments Assessed supine, er/IR adducted   PROM Assessment Site Shoulder   Right/Left Shoulder Right   Right Shoulder Flexion 163 Degrees  148 previous   Right Shoulder ABduction 180 Degrees  99 previous   Right Shoulder Internal Rotation 90 Degrees  same as previous   Right Shoulder External Rotation 90 Degrees  74 previous     Strength   Overall Strength Comments Assessed seated, er/IR adducted   Strength Assessment Site Shoulder   Right/Left Shoulder Right   Right Shoulder Flexion 4+/5  4-/5 previous   Right Shoulder ABduction 4+/5  3/5 previous   Right Shoulder Internal Rotation 4+/5  4-/5 previous   Right Shoulder External Rotation 4+/5  4-/5 previous                  OT Treatments/Exercises (OP) - 02/02/16 0950      Exercises   Exercises Shoulder     Shoulder Exercises: Supine   Protraction PROM;5 reps   Horizontal ABduction PROM;5 reps   External Rotation PROM;5 reps   Internal Rotation PROM;5 reps   Flexion PROM;5 reps   ABduction PROM;5 reps  Shoulder Exercises: Standing   Other Standing Exercises red theraband strengthening: horizontal abduction, er/IR, PNF patterns, 10X each     Shoulder Exercises: ROM/Strengthening   UBE (Upper Arm Bike) Level 1 3' forward 3' reverse     Shoulder Exercises: Stretch   Corner Stretch 1 rep;10 seconds   Cross Chest Stretch 1 rep;10 seconds   Internal Rotation Stretch 1 rep  10 seconds   External Rotation Stretch 1 rep;10 seconds   Wall Stretch - Flexion 2 reps;10 seconds     Manual Therapy   Manual Therapy Myofascial release   Manual therapy comments manual therapy completed prior to exercises.   Myofascial Release Myofascial release and manual stretching completed to right upper arm, trapezius, and scapularis region to decrease fascial restrictions and increase joint mobility in a pain free zone.                 OT Education - 02/02/16 1047    Education provided Yes   Education Details shoulder stretches, theraband strengthening (red)   Person(s) Educated Patient   Methods Explanation;Demonstration;Handout   Comprehension Verbalized understanding;Returned demonstration          OT Short Term Goals - 02/02/16 1004      OT SHORT TERM GOAL #1   Title Pt will be provided with and educated on HEP.    Time 6   Period Weeks   Status Achieved     OT SHORT TERM GOAL #2   Title Pt will decrease pain in RUE to 4/10 to improve ability to complete tasks using RUE as dominant.    Time 6   Period Weeks   Status Achieved     OT SHORT TERM GOAL #3   Title Pt will decrease RUE fascial restrictions to minimal amounts to improve mobility when performing reaching tasks.    Time 6   Period Weeks   Status Achieved     OT SHORT TERM GOAL #4   Title Pt will increase A/ROM of RUE to [facility] to improve ability to perform overhead reaching tasks.    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    Time 6   Period Weeks   Status Achieved                  Plan - 02/02/16 1056    Clinical Impression Statement A: Reassessment completed this session, pt has met all goals. Pt reports she is now able to complete all ADL tasks with independence, does continue to have slight difficulty with end range when reaching into cabinets. 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 Pt provided with updated HEP, agreeable to discharge.    Plan P: Discharge pt   OT Home Exercise Plan 10/5: table slides, ER stretch. 10/11: AA/ROM shoulder exercises. 10/27: Scapular theraband - red; 11/1: A/ROM exercises; 11/15: shoulder stretches, red theraband strengthening   Consulted and Agree with Plan of Care Patient       Patient will benefit from skilled therapeutic intervention in order to improve the following deficits and impairments:  Impaired flexibility, Decreased strength, Pain, Decreased range of motion, Increased fascial restricitons, Impaired UE functional use  Visit Diagnosis: Other symptoms and signs involving the musculoskeletal system  Acute pain of right shoulder  G-Codes - 02/02/16 1114    Functional Assessment Tool Used FOTO Score: 63/100 (37% impairment)   Functional Limitation Carrying, moving and handling objects   Carrying, Moving and Handling Objects Goal Status ([id number]) At least 1 percent but less than 20 percent impaired, limited or restricted   Carrying, Moving and Handling Objects Discharge Status [phone]) At least 20 percent but less than 40 percent impaired, limited or restricted      Problem List Patient Active Problem List   Diagnosis Date Noted  . Tinea 09/03/2015  . Allergic conjunctivitis 09/03/2015  . Shoulder pain, bilateral 09/03/2015  . Metabolic syndrome X 04/47/1580  . Skin lesion of breast 10/06/2013  . Benign pigmented mole 09/22/2013  . Seasonal allergies 07/10/2012  . Prediabetes 02/22/2012  . NEVUS 05/12/2010  . BACK PAIN 02/02/2010  . TENDINITIS 02/02/2010  . UNSPECIFIED DISORDER OF THYROID 08/30/2007  . Hyperlipidemia 08/30/2007  . Obesity 08/30/2007  . Essential hypertension 08/30/2007   Guadelupe Sabin, OTR/L  [phone] 02/02/2016, 11:15 AM  [place], [place], [address] Phone: [phone]   Fax:  [phone]  Name: Christina Carroll MRN: [id number] Date of Birth: 1946/03/18    OCCUPATIONAL THERAPY DISCHARGE SUMMARY  Visits from Start of Care: 12  Current functional level related to goals / functional outcomes: See above. Pt has met all goals, is now able to complete all ADL tasks and leisure tasks with independence.    Remaining deficits: Pt has  slight pain at end range of motion with overhead lifting.    Education / Equipment: Red theraband strengthening, shoulder stretches  Plan: Patient agrees to discharge.  Patient goals were met. Patient is being discharged due to meeting the stated rehab goals.  ?????

## 2016-02-04 ENCOUNTER — Ambulatory Visit (HOSPITAL_COMMUNITY): Payer: Medicare Other | Admitting: Occupational Therapy

## 2016-02-23 ENCOUNTER — Ambulatory Visit (INDEPENDENT_AMBULATORY_CARE_PROVIDER_SITE_OTHER): Payer: Medicare Other | Admitting: Family Medicine

## 2016-02-23 ENCOUNTER — Encounter: Payer: Self-pay | Admitting: Family Medicine

## 2016-02-23 VITALS — BP 120/76 | HR 100 | Temp 98.4°F | Resp 18 | Ht 64.0 in | Wt 187.1 lb

## 2016-02-23 DIAGNOSIS — J0101 Acute recurrent maxillary sinusitis: Secondary | ICD-10-CM | POA: Diagnosis not present

## 2016-02-23 DIAGNOSIS — S61214A Laceration without foreign body of right ring finger without damage to nail, initial encounter: Secondary | ICD-10-CM | POA: Diagnosis not present

## 2016-02-23 DIAGNOSIS — R7303 Prediabetes: Secondary | ICD-10-CM | POA: Diagnosis not present

## 2016-02-23 DIAGNOSIS — J209 Acute bronchitis, unspecified: Secondary | ICD-10-CM

## 2016-02-23 DIAGNOSIS — E785 Hyperlipidemia, unspecified: Secondary | ICD-10-CM | POA: Diagnosis not present

## 2016-02-23 DIAGNOSIS — M25511 Pain in right shoulder: Secondary | ICD-10-CM

## 2016-02-23 DIAGNOSIS — I10 Essential (primary) hypertension: Secondary | ICD-10-CM | POA: Diagnosis not present

## 2016-02-23 DIAGNOSIS — M25512 Pain in left shoulder: Secondary | ICD-10-CM

## 2016-02-23 DIAGNOSIS — R7309 Other abnormal glucose: Secondary | ICD-10-CM | POA: Diagnosis not present

## 2016-02-23 MED ORDER — SULFAMETHOXAZOLE-TRIMETHOPRIM 800-160 MG PO TABS: 1.0000 | ORAL_TABLET | Freq: Two times a day (BID) | ORAL | 0 refills | Status: DC

## 2016-02-23 MED ORDER — PROMETHAZINE-DM 6.25-15 MG/5ML PO SYRP: ORAL_SOLUTION | ORAL | 0 refills | Status: DC

## 2016-02-23 MED ORDER — BENZONATATE 100 MG PO CAPS: 100.0000 mg | ORAL_CAPSULE | Freq: Two times a day (BID) | ORAL | 0 refills | Status: DC | PRN

## 2016-02-23 NOTE — Assessment & Plan Note (Signed)
Controlled, no change in medication DASH diet and commitment to daily physical activity for a minimum of 30 minutes discussed and encouraged, as a part of hypertension management. The importance of attaining a healthy weight is also discussed.  BP/Weight 02/23/2016 01/20/2016 01/03/2016 09/03/2015 08/17/2015 08/10/2015 [id number]  Systolic BP [id number] 0000000 [id number]  Diastolic BP 76 77 86 72 82 72 80  Wt. (Lbs) 187.12 190 - 193 193.8 190 188  BMI 32.12 31.62 - 33.11 33.25 32.6 32.25

## 2016-02-23 NOTE — Patient Instructions (Addendum)
Keep appt as before, and schedule physical exam with me in 4 months  Labs today    You are treated for acute sinusitis, bronchitis and laryngitis. Septra, decongestant  And cough suppressant sent in    Keep finger clean and covered loosely it will continue to heal on itsown  Thank you  for choosing [facility]. We consider it a privelige to serve you.  Delivering excellent health care in a caring and  compassionate way is our goal.  Partnering with you,  so that together we can achieve this goal is our strategy.   Please work on good  health habits so that your health will improve. 1. Commitment to daily physical activity for 30 to 60  minutes, if you are able to do this.  2. Commitment to wise food choices. Aim for half of your  food intake to be vegetable and fruit, one quarter starchy foods, and one quarter protein. Try to eat on a regular schedule  3 meals per day, snacking between meals should be limited to vegetables or fruits or small portions of nuts. 64 ounces of water per day is generally recommended, unless you have specific health conditions, like heart failure or kidney failure where you will need to limit fluid intake.  3. Commitment to sufficient and a  good quality of physical and mental rest daily, generally between 6 to 8 hours per day.  WITH PERSISTANCE AND PERSEVERANCE, THE IMPOSSIBLE , BECOMES THE NORM!

## 2016-02-23 NOTE — Assessment & Plan Note (Signed)
Antibiotic , decongestant and cough suppressant prescribed 

## 2016-02-23 NOTE — Assessment & Plan Note (Signed)
Improving with therapy. 

## 2016-02-23 NOTE — Progress Notes (Signed)
   Christina Carroll     MRN: [id number]      DOB: [birth year]   HPI Christina Carroll is here for follow up and re-evaluation of chronic medical conditions, medication management and review of any available recent lab and radiology data.  Preventive health is updated, specifically  Cancer screening and Immunization.   Questions or concerns regarding consultations or procedures which the PT has had in the interim are  Addressed.Seeing ortho re shoulder pain , states over 60 % better Cut right ring fingeron can at home 4 days ago, no superinfection 1 week h/o worsening head and chest congestion, not associated with fever and chills intermittently. Nasal drainage has thickened , and is yellowish green, and at times bloody. Sputum is thick and yellow. C/o bilateral ear pressure, denies hearing loss and sore throat. Increasing fatigue , poor appetitie and sleep disturbed by cough.Hoarseness, sneezing No improvement with OTC medication.  ROS Denies chest pains, palpitations and leg swelling Denies abdominal pain, nausea, vomiting,diarrhea or constipation.   Denies dysuria, frequency, hesitancy or incontinence.  Denies headaches, seizures, numbness, or tingling. Denies depression, anxiety or insomnia. Denies skin break down or rash.   PE  BP 120/76   Pulse 100   Temp 98.4 F (36.9 C)   Resp 18   Ht 5\' 4"  (1.626 m)   Wt 187 lb 1.9 oz (84.9 kg)   SpO2 95%   BMI 32.12 kg/m   Patient alert and oriented and in no cardiopulmonary distress.  HEENT: No facial asymmetry, EOMI,   oropharynx pink and moist.  Neck supple no JVD, no mass.Maxillary sinus tenderness, bilateral anterior cervical adenitis  Chest: adequate air entry, scattered crackles, no wheezes  CVS: S1, S2 no murmurs, no S3.Regular rate.  ABD: Soft non tender.   Ext: No edema  MS: Adequate ROM spine, decreased in R shoulder, adequate in  hips and knees.  Skin: clean laceration on right ring finger, length approx 2  cm  Psych: Good eye contact, normal affect. Memory intact not anxious or depressed appearing.  CNS: CN 2-12 intact, power,  normal throughout.no focal deficits noted.   Assessment & Plan  Essential hypertension Controlled, no change in medication DASH diet and commitment to daily physical activity for a minimum of 30 minutes discussed and encouraged, as a part of hypertension management. The importance of attaining a healthy weight is also discussed.  BP/Weight 02/23/2016 01/20/2016 01/03/2016 09/03/2015 08/17/2015 08/10/2015 [id number]  Systolic BP 123456 0000000 [id number] [id number] [id number]  Diastolic BP 76 77 86 72 82 72 80  Wt. (Lbs) 187.12 190 - 193 193.8 190 188  BMI 32.12 31.62 - 33.11 33.25 32.6 32.25       Acute sinusitis Antibiotic prescribed x 1 week  Acute bronchitis Antibiotic , decongestant and cough suppressant prescribed  Shoulder pain, bilateral Improving with therapy  Laceration of right ring finger 5 day injury, no superinfection noted, will heal by intention  Obesity Improved. Pt applauded on succesful weight loss through lifestyle change, and encouraged to continue same. Weight loss goal set for the next several months.

## 2016-02-23 NOTE — Assessment & Plan Note (Signed)
Antibiotic prescribed x 1 week

## 2016-02-23 NOTE — Assessment & Plan Note (Signed)
5 day injury, no superinfection noted, will heal by intention

## 2016-02-23 NOTE — Assessment & Plan Note (Signed)
Improved. Pt applauded on succesful weight loss through lifestyle change, and encouraged to continue same. Weight loss goal set for the next several months.  

## 2016-02-24 ENCOUNTER — Encounter (INDEPENDENT_AMBULATORY_CARE_PROVIDER_SITE_OTHER): Payer: Self-pay | Admitting: Orthopaedic Surgery

## 2016-02-24 ENCOUNTER — Ambulatory Visit (INDEPENDENT_AMBULATORY_CARE_PROVIDER_SITE_OTHER): Payer: Medicare Other | Admitting: Orthopaedic Surgery

## 2016-02-24 ENCOUNTER — Encounter: Payer: Self-pay | Admitting: Family Medicine

## 2016-02-24 VITALS — BP 110/72 | HR 101 | Ht 65.0 in | Wt 187.0 lb

## 2016-02-24 DIAGNOSIS — M25511 Pain in right shoulder: Secondary | ICD-10-CM | POA: Diagnosis not present

## 2016-02-24 DIAGNOSIS — G8929 Other chronic pain: Secondary | ICD-10-CM | POA: Diagnosis not present

## 2016-02-24 LAB — COMPREHENSIVE METABOLIC PANEL
ALK PHOS: 81 U/L (ref 33–130)
ALT: 21 U/L (ref 6–29)
AST: 23 U/L (ref 10–35)
Albumin: 4 g/dL (ref 3.6–5.1)
BILIRUBIN TOTAL: 0.8 mg/dL (ref 0.2–1.2)
BUN: 17 mg/dL (ref 7–25)
CO2: 32 mmol/L — ABNORMAL HIGH (ref 20–31)
CREATININE: 1.08 mg/dL — AB (ref 0.50–0.99)
Calcium: 9.9 mg/dL (ref 8.6–10.4)
Chloride: 98 mmol/L (ref 98–110)
GLUCOSE: 86 mg/dL (ref 65–99)
POTASSIUM: 4.5 mmol/L (ref 3.5–5.3)
SODIUM: 138 mmol/L (ref 135–146)
TOTAL PROTEIN: 7 g/dL (ref 6.1–8.1)

## 2016-02-24 LAB — LIPID PANEL
CHOL/HDL RATIO: 2.9 ratio (ref <5.0)
CHOLESTEROL: 179 mg/dL (ref <200)
HDL: 62 mg/dL (ref >50)
LDL Cholesterol: 100 mg/dL — ABNORMAL HIGH (ref <100)
Triglycerides: 83 mg/dL (ref <150)
VLDL: 17 mg/dL (ref <30)

## 2016-02-24 LAB — HEMOGLOBIN A1C
HEMOGLOBIN A1C: 5.8 % — AB (ref <5.7)
MEAN PLASMA GLUCOSE: 120 mg/dL

## 2016-02-24 NOTE — Progress Notes (Signed)
Office Visit Note   Patient: Christina Carroll           Date of Birth: [birth year]           MRN: [id number] Visit Date: 02/24/2016              Requested by: Fayrene Helper, MD [address], [place],  [address] PCP: Tula Nakayama, MD   Assessment & Plan: Visit Diagnoses:  1. Chronic right shoulder pain     Plan: Patient got some improvement with physical therapy. She still has some grinding in her shoulder. MRI scan shows some rotator cuff tendinopathy but she did not have a full-thickness tear. There was multiple chondral pieces and large piece in the axillary recess. At present she is improved enough where she can continue with home exercise program. If she develops significant increase in pain she'll let us know we have discussed arthroscopic evaluation with chondroplasty and removal of multiple loose pieces. We reviewed again the MRI findings. She has finished her therapy will continue her home exercises and I can recheck her in 3 months. If she develops significant increase in pain and repetitive catching she will call.  Follow-Up Instructions: Return in about 3 months (around 05/24/2016).   Orders:  No orders of the defined types were placed in this encounter.  No orders of the defined types were placed in this encounter.     Procedures: No procedures performed   Clinical Data: No additional findings.   Subjective: Chief Complaint  Patient presents with  . Right Shoulder - Pain  . Left Shoulder - Pain    Patient returns for one month follow up bilateral shoulder pain, right greater than left. She states that she has finished physical therapy and that she is doing much better. She is sleeping at night.    Review of Systems  Constitutional: Negative for chills and diaphoresis.  HENT: Negative for ear discharge, ear pain and nosebleeds.   Eyes: Negative for discharge and visual disturbance.  Respiratory: Negative for cough, choking and  shortness of breath.   Cardiovascular: Negative for chest pain and palpitations.  Gastrointestinal: Negative for abdominal distention and abdominal pain.  Endocrine: Negative for cold intolerance and heat intolerance.  Genitourinary: Negative for flank pain and hematuria.  Musculoskeletal:       Positive for right shoulder pain with range of motion  Skin: Negative for rash and wound.  Neurological: Negative for seizures, facial asymmetry and speech difficulty.  Hematological: Negative for adenopathy. Does not bruise/bleed easily.  Psychiatric/Behavioral: Negative for agitation and suicidal ideas.     Objective: Vital Signs: BP 110/72   Pulse (!) 101   Ht 5\' 5"  (1.651 m)   Wt 187 lb (84.8 kg)   BMI 31.12 kg/m   Physical Exam  Constitutional: She is oriented to person, place, and time. She appears well-developed.  HENT:  Head: Normocephalic.  Right Ear: External ear normal.  Left Ear: External ear normal.  Eyes: Pupils are equal, round, and reactive to light.  Neck: No tracheal deviation present. No thyromegaly present.  Cardiovascular: Normal rate.   Pulmonary/Chest: Effort normal.  Abdominal: Soft.  Musculoskeletal:  Patient has crepitus of shoulder range of motion she get arm up over her head. Negative cross arm adduction test. I will reaches full extension cessation her hand is normal shows mild brachioplexus tenderness on the right side. Long head of biceps is minimally tender.  Neurological: She is alert and oriented to person,  place, and time.  Skin: Skin is warm and dry.  Psychiatric: She has a normal mood and affect. Her behavior is normal.    Ortho Exam see above  Specialty Comments:  No specialty comments available.  Imaging: No results found.   PMFS History: Patient Active Problem List   Diagnosis Date Noted  . Laceration of right ring finger 02/23/2016  . Allergic conjunctivitis 09/03/2015  . Shoulder pain, bilateral 09/03/2015  . Acute sinusitis  03/23/2015  . Acute bronchitis 03/23/2015  . Metabolic syndrome X [id number]  . Benign pigmented mole 09/22/2013  . Seasonal allergies 07/10/2012  . Prediabetes 02/22/2012  . NEVUS 05/12/2010  . BACK PAIN 02/02/2010  . TENDINITIS 02/02/2010  . UNSPECIFIED DISORDER OF THYROID 08/30/2007  . Hyperlipidemia 08/30/2007  . Obesity 08/30/2007  . Essential hypertension 08/30/2007   Past Medical History:  Diagnosis Date  . Arthritis   . Essential hypertension   . Hyperlipidemia   . Lymphocytic thyroiditis   . Obesity   . Prediabetes     Family History  Problem Relation Age of Onset  . Stroke Mother   . Diabetes Mother   . Hypertension Mother   . Coronary artery disease Father   . Arthritis    . Cancer    . Breast cancer Maternal Aunt     Past Surgical History:  Procedure Laterality Date  . Bilateral foot surgery bone removed  2000  . COLONOSCOPY  11/30/2011   Procedure: COLONOSCOPY;  Surgeon: Rogene Houston, MD;  Location: [facility];  Service: Endoscopy;  Laterality: N/A;  830  . Lumpectomy removed from thoracic spine area  1999   Social History   Occupational History  . Employed     Social History Main Topics  . Smoking status: Never Smoker  . Smokeless tobacco: Never Used  . Alcohol use No  . Drug use: No  . Sexual activity: Not Currently

## 2016-03-02 ENCOUNTER — Ambulatory Visit (INDEPENDENT_AMBULATORY_CARE_PROVIDER_SITE_OTHER): Payer: Medicare Other | Admitting: Orthopaedic Surgery

## 2016-03-09 ENCOUNTER — Encounter: Payer: Medicare Other | Admitting: Family Medicine

## 2016-03-23 ENCOUNTER — Ambulatory Visit: Payer: Medicare Other

## 2016-04-05 ENCOUNTER — Ambulatory Visit: Payer: Medicare Other

## 2016-04-05 ENCOUNTER — Other Ambulatory Visit: Payer: Self-pay | Admitting: Family Medicine

## 2016-04-06 ENCOUNTER — Telehealth: Payer: Self-pay | Admitting: Family Medicine

## 2016-04-06 NOTE — Telephone Encounter (Signed)
AWV canceled 04/07/16 and she will call to reschedule after she has oral surgery next week.

## 2016-04-07 ENCOUNTER — Ambulatory Visit: Payer: Medicare Other

## 2016-04-25 ENCOUNTER — Ambulatory Visit (INDEPENDENT_AMBULATORY_CARE_PROVIDER_SITE_OTHER): Payer: Medicare Other

## 2016-04-25 VITALS — BP 122/74 | HR 83 | Temp 98.3°F | Ht 65.0 in | Wt 192.0 lb

## 2016-04-25 DIAGNOSIS — Z13820 Encounter for screening for osteoporosis: Secondary | ICD-10-CM

## 2016-04-25 DIAGNOSIS — Z0000 Encounter for general adult medical examination without abnormal findings: Secondary | ICD-10-CM

## 2016-04-25 DIAGNOSIS — E785 Hyperlipidemia, unspecified: Secondary | ICD-10-CM | POA: Diagnosis not present

## 2016-04-25 DIAGNOSIS — M949 Disorder of cartilage, unspecified: Secondary | ICD-10-CM

## 2016-04-25 DIAGNOSIS — R7303 Prediabetes: Secondary | ICD-10-CM

## 2016-04-25 DIAGNOSIS — M899 Disorder of bone, unspecified: Secondary | ICD-10-CM

## 2016-04-25 DIAGNOSIS — I10 Essential (primary) hypertension: Secondary | ICD-10-CM

## 2016-04-25 NOTE — Patient Instructions (Addendum)
Health maintenance: You are due for a repeat bone density scan. This has been ordered for you today, please call [facility] and schedule this as soon as you are able.   Abnormal screenings: None   Patient concerns: None   Nurse concerns: BMI, recommend starting an exercise program 3 times a week for at least 30 minutes at a time.   Next PCP appt: Follow up with Dr. Moshe Cipro on 06/26/2016 at 9:20am for your annual physical exam. Fasting lab work has been ordered for you today, please have this completed about 1 week before your appointment with Dr. Moshe Cipro. Follow up in 1 year for your annual wellness visit.  Advance directive discussed with patient today. Copy provided for patient to complete at home and have notarized. Patient agrees to have copy sent to our office once it is complete.  Preventive Care 69 Years and Older, Female Preventive care refers to lifestyle choices and visits with your health care provider that can promote health and wellness. What does preventive care include?  A yearly physical exam. This is also called an annual well check.  Dental exams once or twice a year.  Routine eye exams. Ask your health care provider how often you should have your eyes checked.  Personal lifestyle choices, including:  Daily care of your teeth and gums.  Regular physical activity.  Eating a healthy diet.  Avoiding tobacco and drug use.  Limiting alcohol use.  Taking low-dose aspirin every day.  Taking vitamin and mineral supplements as recommended by your health care provider. What happens during an annual well check? The services and screenings done by your health care provider during your annual well check will depend on your age, overall health, lifestyle risk factors, and family history of disease. Counseling  Your health care provider may ask you questions about your:  Alcohol use.  Tobacco use.  Drug use.  Emotional well-being.  Home and relationship  well-being.  Sexual activity.  Eating habits.  History of falls.  Memory and ability to understand (cognition).  Work and work Statistician.  Reproductive health. Screening  You may have the following tests or measurements:  Height, weight, and BMI.  Blood pressure.  Lipid and cholesterol levels. These may be checked every 5 years, or more frequently if you are over 63 years old.  Skin check.  Lung cancer screening. You may have this screening every year starting at age 13 if you have a 30-pack-year history of smoking and currently smoke or have quit within the past 15 years.  Fecal occult blood test (FOBT) of the stool. You may have this test every year starting at age 31.  Flexible sigmoidoscopy or colonoscopy. You may have a sigmoidoscopy every 5 years or a colonoscopy every 10 years starting at age 50.  Hepatitis C blood test.  Diabetes screening. This is done by checking your blood sugar (glucose) after you have not eaten for a while (fasting). You may have this done every 1-3 years.  Bone density scan. This is done to screen for osteoporosis. You may have this done starting at age 42.  Mammogram. This may be done every 1-2 years. Talk to your health care provider about how often you should have regular mammograms. Talk with your health care provider about your test results, treatment options, and if necessary, the need for more tests. Vaccines  Your health care provider may recommend certain vaccines, such as:  Influenza vaccine. This is recommended every year.  Tetanus, diphtheria, and acellular  pertussis (Tdap, Td) vaccine. You may need a Td booster every 10 years.  Zoster vaccine. You may need this after age 17.  Measles, mumps, and rubella (MMR) vaccine. You may need at least one dose of MMR if you were born in 1957 or later. You may also need a second dose.  Pneumococcal 13-valent conjugate (PCV13) vaccine. One dose is recommended after age  8.  Pneumococcal polysaccharide (PPSV23) vaccine. One dose is recommended after age 30.  Meningococcal vaccine. You may need this if you have certain conditions.  Talk to your health care provider about which screenings and vaccines you need and how often you need them. This information is not intended to replace advice given to you by your health care provider. Make sure you discuss any questions you have with your health care provider. Document Released: 04/02/2015 Document Revised: 11/24/2015 Document Reviewed: 01/05/2015 Elsevier Interactive Patient Education  2017 [employer].

## 2016-04-25 NOTE — Progress Notes (Signed)
Subjective:   Christina Carroll is a 71 y.o. female who presents for Medicare Annual (Subsequent) preventive examination.  Review of Systems:  Cardiac Risk Factors include: advanced age (>8men, >38 women);hypertension;dyslipidemia;obesity (BMI >30kg/m2);sedentary lifestyle     Objective:     Vitals: BP 122/74   Pulse 83   Temp 98.3 F (36.8 C) (Oral)   Ht 5\' 5"  (1.651 m)   Wt 192 lb (87.1 kg)   SpO2 93%   BMI 31.95 kg/m   Body mass index is 31.95 kg/m.   Tobacco History  Smoking Status  . Never Smoker  Smokeless Tobacco  . Never Used     Counseling given: Not Answered   Past Medical History:  Diagnosis Date  . Arthritis   . Essential hypertension   . Hyperlipidemia   . Lymphocytic thyroiditis   . Obesity   . Prediabetes    Past Surgical History:  Procedure Laterality Date  . Bilateral foot surgery bone removed  2000  . COLONOSCOPY  11/30/2011   Procedure: COLONOSCOPY;  Surgeon: Rogene Houston, MD;  Location: [facility];  Service: Endoscopy;  Laterality: N/A;  830  . Lumpectomy removed from thoracic spine area  1999   Family History  Problem Relation Age of Onset  . Stroke Mother   . Diabetes Mother   . Hypertension Mother   . Coronary artery disease Father   . Arthritis    . Cancer    . Breast cancer Maternal Aunt    History  Sexual Activity  . Sexual activity: Not Currently    Outpatient Encounter Prescriptions as of 04/25/2016  Medication Sig  . aspirin (ASPIRIN LOW DOSE) 81 MG EC tablet Take 81 mg by mouth daily.   . benzonatate (TESSALON) 100 MG capsule Take 1 capsule (100 mg total) by mouth 2 (two) times daily as needed for cough.  . calcium-vitamin D (OSCAL WITH D) 500-200 MG-UNIT TABS tablet TAKE (1) TABLET BY MOUTH (3) TIMES DAILY.  . clotrimazole-betamethasone (LOTRISONE) cream Apply twice daily to rash for 10 days, then as needed  . fluticasone (FLONASE) 50 MCG/ACT nasal spray Place 2 sprays into both nostrils daily. For  allergies  . NIFEdipine (PROCARDIA XL/ADALAT-CC) 60 MG 24 hr tablet TAKE 1 TABLET BY MOUTH  DAILY  . olopatadine (PATANOL) 0.1 % ophthalmic solution One drop to affected eye (s) twice daily as needed  . pravastatin (PRAVACHOL) 80 MG tablet TAKE 1 TABLET BY MOUTH  DAILY  . promethazine-dextromethorphan (PROMETHAZINE-DM) 6.25-15 MG/5ML syrup One teaspoon at bedtimmas needed fopr excessive xcough  . triamterene-hydrochlorothiazide (MAXZIDE-25) 37.5-25 MG tablet Take 1 tablet by mouth daily.  . [DISCONTINUED] Calcium Carb-Cholecalciferol (CALCIUM-VITAMIN D3) 250-125 MG-UNIT TABS Take by mouth.  . [DISCONTINUED] sulfamethoxazole-trimethoprim (BACTRIM DS,SEPTRA DS) 800-160 MG tablet Take 1 tablet by mouth 2 (two) times daily.   No facility-administered encounter medications on file as of 04/25/2016.     Activities of Daily Living In your present state of health, do you have any difficulty performing the following activities: 04/25/2016 09/03/2015  Hearing? Y N  Vision? N N  Difficulty concentrating or making decisions? N N  Walking or climbing stairs? N N  Dressing or bathing? N N  Doing errands, shopping? N N  Preparing Food and eating ? N -  Using the Toilet? N -  In the past six months, have you accidently leaked urine? N -  Do you have problems with loss of bowel control? N -  Managing your Medications? N -  Managing your Finances? N -  Housekeeping or managing your Housekeeping? N -  Some recent data might be hidden    Patient Care Team: Fayrene Helper, MD as PCP - General Lenard Simmer, MD as Attending Physician (Endocrinology) Marybelle Killings, MD as Consulting Physician (Orthopedic Surgery)    Assessment:    Exercise Activities and Dietary recommendations Current Exercise Habits: The patient does not participate in regular exercise at present, Exercise limited by: None identified  Goals    . Exercise 3x per week (30 min per time)          Patient would like to start  exercising 3 times a week for at least 30 minutes at a time.       Fall Risk Fall Risk  04/25/2016 03/09/2015 03/09/2015 06/29/2014 02/02/2014  Falls in the past year? Yes No No No No  Number falls in past yr: 1 - - - -  Injury with Fall? No - - - -  Follow up Falls evaluation completed - - - -   Depression Screen PHQ 2/9 Scores 04/25/2016 03/09/2015 03/09/2015 04/03/2013  PHQ - 2 Score 0 0 4 0  PHQ- 9 Score - 0 12 0     Cognitive Function   Normal 6CIT Screen 04/25/2016  What Year? 0 points  What month? 0 points  What time? 0 points  Count back from 20 0 points  Months in reverse 0 points  Repeat phrase 0 points  Total Score 0    Immunization History  Administered Date(s) Administered  . Influenza Split 02/21/2011, 01/31/2012  . Influenza Whole 01/23/2007, 12/21/2008, 02/02/2010  . Influenza,inj,Quad PF,36+ Mos 12/24/2013, 01/18/2015, 12/01/2015  . Pneumococcal Conjugate-13 09/22/2013  . Pneumococcal Polysaccharide-23 04/03/2013  . Td 04/10/2005  . Tdap 06/29/2014  . Zoster 04/19/2009   Screening Tests Health Maintenance  Topic Date Due  . MAMMOGRAM  10/27/2017  . COLONOSCOPY  11/29/2021  . TETANUS/TDAP  06/28/2024  . INFLUENZA VACCINE  Completed  . DEXA SCAN  Completed  . ZOSTAVAX  Completed  . Hepatitis C Screening  Completed  . PNA vac Low Risk Adult  Completed      Plan:    I have personally reviewed and addressed the Medicare Annual Wellness questionnaire and have noted the following in the patient's chart:  A. Medical and social history B. Use of alcohol, tobacco or illicit drugs  C. Current medications and supplements D. Functional ability and status E.  Nutritional status F.  Physical activity G. Advance directives discussed today H. List of other physicians I.  Hospitalizations, surgeries, and ER visits in previous 12 months J.  [place] to include hearing, vision, cognitive, depression L. Referrals and appointments - Dexa ordered  today  In addition, I have reviewed and discussed with patient certain preventive protocols, quality metrics, and best practice recommendations. A written personalized care plan for preventive services as well as general preventive health recommendations were provided to patient.  Signed,   Stormy Fabian, LPN Lead Nurse Health Advisor

## 2016-05-15 ENCOUNTER — Other Ambulatory Visit: Payer: Self-pay | Admitting: Family Medicine

## 2016-05-19 ENCOUNTER — Other Ambulatory Visit: Payer: Self-pay | Admitting: Family Medicine

## 2016-05-22 DIAGNOSIS — E063 Autoimmune thyroiditis: Secondary | ICD-10-CM | POA: Diagnosis not present

## 2016-05-22 DIAGNOSIS — E049 Nontoxic goiter, unspecified: Secondary | ICD-10-CM | POA: Diagnosis not present

## 2016-05-29 DIAGNOSIS — E785 Hyperlipidemia, unspecified: Secondary | ICD-10-CM | POA: Diagnosis not present

## 2016-05-29 DIAGNOSIS — I10 Essential (primary) hypertension: Secondary | ICD-10-CM | POA: Diagnosis not present

## 2016-05-29 DIAGNOSIS — E063 Autoimmune thyroiditis: Secondary | ICD-10-CM | POA: Diagnosis not present

## 2016-05-29 DIAGNOSIS — E049 Nontoxic goiter, unspecified: Secondary | ICD-10-CM | POA: Diagnosis not present

## 2016-06-01 ENCOUNTER — Ambulatory Visit (INDEPENDENT_AMBULATORY_CARE_PROVIDER_SITE_OTHER): Payer: Medicare Other | Admitting: Orthopaedic Surgery

## 2016-06-01 ENCOUNTER — Encounter (INDEPENDENT_AMBULATORY_CARE_PROVIDER_SITE_OTHER): Payer: Self-pay | Admitting: Orthopaedic Surgery

## 2016-06-01 VITALS — BP 117/73 | HR 74 | Ht 64.0 in | Wt 190.0 lb

## 2016-06-01 DIAGNOSIS — M94211 Chondromalacia, right shoulder: Secondary | ICD-10-CM | POA: Diagnosis not present

## 2016-06-01 NOTE — Progress Notes (Signed)
Office Visit Note   Patient: Christina Carroll           Date of Birth: 05-30-1945           MRN: [id number] Visit Date: 06/01/2016              Requested by: Fayrene Helper, MD [address] [place], [place] [address] PCP: Tula Nakayama, MD   Assessment & Plan: Visit Diagnoses:  1. Chondromalacia of right shoulder            With 6 mm chondral loose body and repetitive locking.  Plan: Patient tried conservative treatment anti-inflammatories and activity modification. Unfortunately shoulder continues to lock and when it does it's extremely painful. Plan would be outpatient shoulder arthroscopy which she is requesting with removal of loose body and chondroplasty at the glenohumeral joint as needed. Rotator cuff does not appear that it would need surgical treatment at this time. This would be an outpatient procedure likely with the preoperative interscalene block for postoperative pain control. Questions were elicited and answered procedure was discussed. She understands request we proceed.  Follow-Up Instructions: No Follow-up on file.   Orders:  No orders of the defined types were placed in this encounter.  No orders of the defined types were placed in this encounter.     Procedures: No procedures performed   Clinical Data: No additional findings.   Subjective: Chief Complaint  Patient presents with  . Right Shoulder - Pain    Patient returns for follow up right shoulder pain. She states that physical therapy has helped with her range of motion. She would like to discuss removing the loose bodies that she has in there.   Patient had repetitive episodes were shoulder locks sometimes when she is bathing or when she's sleeping she rolls over and the right shoulder locks and is painful. It takes her sometimes 2 minutes to get her shoulder moving again and then it's painful difficult to go to sleep. At times with activities of daily living it catches with  sharp pain and then she gets relief. Previous MRI showed 6 mm loose body which is a chondral fragment with some chondromalacia of the shoulder. Her symptoms have been in various positions for the shoulder and it is not reproducible with one particular motion.  Review of Systems 14 point review of systems is updated. Of note his past problems with thyroid, obesity, back pain, prediabetes, history of bronchitis and the right shoulder locking. Negative for fever or chills negative for rheumatologic condition.   Objective: Vital Signs: BP 117/73   Pulse 74   Ht 5\' 4"  (1.626 m)   Wt 190 lb (86.2 kg)   BMI 32.61 kg/m   Physical Exam  Constitutional: She is oriented to person, place, and time. She appears well-developed.  HENT:  Head: Normocephalic.  Bilateral hearing aids  Eyes: Pupils are equal, round, and reactive to light.  Neck: No tracheal deviation present. No thyromegaly present.  Cardiovascular: Normal rate.   Pulmonary/Chest: Effort normal.  Abdominal: Soft.  Musculoskeletal:  Patient's able get her arm up overhead. She can reach the posterior axillary line she can reach across the opposite shoulder. With motion today in the office she did not lock. She states it occurs at night typically sometimes when she is in supine or turns the lateral position and states it's very painful. Reflexes are 2+ symmetrical no brachial plexus tenderness otherwise full range of motion. Lung of the biceps is normal.  No subluxation of the shoulder.  Neurological: She is alert and oriented to person, place, and time.  Skin: Skin is warm and dry.  Psychiatric: She has a normal mood and affect. Her behavior is normal.    Ortho Exam  Specialty Comments:  No specialty comments available.  Imaging:   Study Result   CLINICAL DATA:  Right shoulder pain for 2 months.  No known injury.  EXAM: MRI OF THE RIGHT SHOULDER WITHOUT CONTRAST  TECHNIQUE: Multiplanar, multisequence MR imaging of the  shoulder was performed. No intravenous contrast was administered.  COMPARISON:  None.  FINDINGS: Rotator cuff: Mild tendinosis of the supraspinatus tendon. Severe tendinosis of the infraspinatus tendon. Teres minor tendon is intact. Subscapularis tendon is intact.  Muscles: No atrophy or fatty replacement of nor abnormal signal within, the muscles of the rotator cuff.  Biceps long head:  Intact.  Acromioclavicular Joint: Severe arthropathy of the acromioclavicular joint. Type I acromion. No subacromial/subdeltoid bursal fluid.  Glenohumeral Joint: Small joint effusion with mild synovitis. 6 mm loose body in the axillary recess. Partial-thickness cartilage loss of the glenohumeral joint.  Labrum: Grossly intact, but evaluation is limited by lack of intraarticular fluid.  Bones: Subcortical reactive marrow changes at the supraspinatus, infraspinatus and subscapularis insertion.  Other: No fluid collection or hematoma.  IMPRESSION: 1.  Mild tendinosis of the supraspinatus tendon. 2. Severe tendinosis of the infraspinatus tendon. 3. Small joint effusion with mild synovitis. 6 mm loose body in the axillary recess. 4. Partial-thickness cartilage loss of the glenohumeral joint consistent with mild osteoarthritis.   Electronically Signed   By: Kathreen Devoid   On: 12/15/2015 09:26       PMFS History: Patient Active Problem List   Diagnosis Date Noted  . Laceration of right ring finger 02/23/2016  . Allergic conjunctivitis 09/03/2015  . Shoulder pain, bilateral 09/03/2015  . Acute sinusitis 03/23/2015  . Acute bronchitis 03/23/2015  . Metabolic syndrome X 62/56/3893  . Benign pigmented mole 09/22/2013  . Seasonal allergies 07/10/2012  . Prediabetes 02/22/2012  . NEVUS 05/12/2010  . BACK PAIN 02/02/2010  . TENDINITIS 02/02/2010  . UNSPECIFIED DISORDER OF THYROID 08/30/2007  . Hyperlipidemia 08/30/2007  . Obesity 08/30/2007  . Essential hypertension  08/30/2007   Past Medical History:  Diagnosis Date  . Arthritis   . Essential hypertension   . Hyperlipidemia   . Lymphocytic thyroiditis   . Obesity   . Prediabetes     Family History  Problem Relation Age of Onset  . Stroke Mother   . Diabetes Mother   . Hypertension Mother   . Coronary artery disease Father   . Arthritis    . Cancer    . Breast cancer Maternal Aunt     Past Surgical History:  Procedure Laterality Date  . Bilateral foot surgery bone removed  2000  . COLONOSCOPY  11/30/2011   Procedure: COLONOSCOPY;  Surgeon: Rogene Houston, MD;  Location: [facility];  Service: Endoscopy;  Laterality: N/A;  830  . Lumpectomy removed from thoracic spine area  1999   Social History   Occupational History  . Employed     Social History Main Topics  . Smoking status: Never Smoker  . Smokeless tobacco: Never Used  . Alcohol use No  . Drug use: No  . Sexual activity: Not Currently

## 2016-06-26 ENCOUNTER — Encounter: Payer: Medicare Other | Admitting: Family Medicine

## 2016-06-26 DIAGNOSIS — M75111 Incomplete rotator cuff tear or rupture of right shoulder, not specified as traumatic: Secondary | ICD-10-CM | POA: Diagnosis not present

## 2016-06-26 DIAGNOSIS — S46211D Strain of muscle, fascia and tendon of other parts of biceps, right arm, subsequent encounter: Secondary | ICD-10-CM | POA: Diagnosis not present

## 2016-06-26 DIAGNOSIS — G8918 Other acute postprocedural pain: Secondary | ICD-10-CM | POA: Diagnosis not present

## 2016-06-26 DIAGNOSIS — M24011 Loose body in right shoulder: Secondary | ICD-10-CM | POA: Diagnosis not present

## 2016-06-26 DIAGNOSIS — M75121 Complete rotator cuff tear or rupture of right shoulder, not specified as traumatic: Secondary | ICD-10-CM | POA: Diagnosis not present

## 2016-06-26 DIAGNOSIS — M94211 Chondromalacia, right shoulder: Secondary | ICD-10-CM | POA: Diagnosis not present

## 2016-06-26 DIAGNOSIS — M66821 Spontaneous rupture of other tendons, right upper arm: Secondary | ICD-10-CM | POA: Diagnosis not present

## 2016-07-11 ENCOUNTER — Inpatient Hospital Stay (INDEPENDENT_AMBULATORY_CARE_PROVIDER_SITE_OTHER): Payer: Medicare Other | Admitting: Orthopaedic Surgery

## 2016-07-13 ENCOUNTER — Encounter (INDEPENDENT_AMBULATORY_CARE_PROVIDER_SITE_OTHER): Payer: Self-pay | Admitting: Orthopaedic Surgery

## 2016-07-13 ENCOUNTER — Ambulatory Visit (INDEPENDENT_AMBULATORY_CARE_PROVIDER_SITE_OTHER): Payer: Medicare Other | Admitting: Orthopaedic Surgery

## 2016-07-13 VITALS — BP 120/76 | HR 79 | Ht 65.0 in | Wt 192.0 lb

## 2016-07-13 DIAGNOSIS — M19011 Primary osteoarthritis, right shoulder: Secondary | ICD-10-CM | POA: Insufficient documentation

## 2016-07-13 HISTORY — DX: Primary osteoarthritis, right shoulder: M19.011

## 2016-07-13 NOTE — Progress Notes (Signed)
   Post-Op Visit Note   Patient: Christina Carroll           Date of Birth: 01/10/1946           MRN: [id number] Visit Date: 07/13/2016 PCP: Tula Nakayama, MD   Assessment & Plan:  Chief Complaint:  Chief Complaint  Patient presents with  . Right Shoulder - Routine Post Op   Visit Diagnoses:  1. Primary osteoarthritis, right shoulder     Plan: Patient had a shoulder chondroplasty extensive debridement with glenohumeral arthritis and removal of the large loose body that was removed through separate portal. She'll work on active assistive range of motion. Sutures were removed today incision portals look good. I will recheck her again in one month.  Follow-Up Instructions: No Follow-up on file.   Orders:  No orders of the defined types were placed in this encounter.  No orders of the defined types were placed in this encounter.   Imaging: No results found.  PMFS History: Patient Active Problem List   Diagnosis Date Noted  . Chondromalacia of right shoulder 06/01/2016  . Laceration of right ring finger 02/23/2016  . Allergic conjunctivitis 09/03/2015  . Shoulder pain, bilateral 09/03/2015  . Acute sinusitis 03/23/2015  . Acute bronchitis 03/23/2015  . Metabolic syndrome X 01/74/9449  . Benign pigmented mole 09/22/2013  . Seasonal allergies 07/10/2012  . Prediabetes 02/22/2012  . NEVUS 05/12/2010  . BACK PAIN 02/02/2010  . TENDINITIS 02/02/2010  . UNSPECIFIED DISORDER OF THYROID 08/30/2007  . Hyperlipidemia 08/30/2007  . Obesity 08/30/2007  . Essential hypertension 08/30/2007   Past Medical History:  Diagnosis Date  . Arthritis   . Essential hypertension   . Hyperlipidemia   . Lymphocytic thyroiditis   . Obesity   . Prediabetes     Family History  Problem Relation Age of Onset  . Stroke Mother   . Diabetes Mother   . Hypertension Mother   . Coronary artery disease Father   . Arthritis    . Cancer    . Breast cancer Maternal Aunt     Past  Surgical History:  Procedure Laterality Date  . Bilateral foot surgery bone removed  2000  . COLONOSCOPY  11/30/2011   Procedure: COLONOSCOPY;  Surgeon: Rogene Houston, MD;  Location: [facility];  Service: Endoscopy;  Laterality: N/A;  830  . Lumpectomy removed from thoracic spine area  1999   Social History   Occupational History  . Employed     Social History Main Topics  . Smoking status: Never Smoker  . Smokeless tobacco: Never Used  . Alcohol use No  . Drug use: No  . Sexual activity: Not Currently

## 2016-07-20 ENCOUNTER — Inpatient Hospital Stay (INDEPENDENT_AMBULATORY_CARE_PROVIDER_SITE_OTHER): Payer: Medicare Other | Admitting: Orthopaedic Surgery

## 2016-07-25 ENCOUNTER — Telehealth: Payer: Self-pay

## 2016-07-25 NOTE — Telephone Encounter (Signed)
scheduled

## 2016-07-25 NOTE — Telephone Encounter (Signed)
Give work in  appt this week wed or Thursday pls

## 2016-07-25 NOTE — Telephone Encounter (Signed)
Cough x2 weeks. Took coricidin and it got better but Sunday she started coughing up yellow mucus again and felt tight in her chest. No fever. Wants to know if you wanna check her sputum, see her or call something in. Please advise

## 2016-07-25 NOTE — Telephone Encounter (Signed)
Christina Carroll called stating she has had a cough for 2 weeks. She has " an old specimen container at home" and was wondering if she could drop one off to be checked?

## 2016-07-26 ENCOUNTER — Encounter: Payer: Self-pay | Admitting: Family Medicine

## 2016-07-26 ENCOUNTER — Ambulatory Visit (INDEPENDENT_AMBULATORY_CARE_PROVIDER_SITE_OTHER): Payer: Medicare Other | Admitting: Family Medicine

## 2016-07-26 VITALS — BP 130/82 | HR 89 | Temp 98.7°F | Resp 16 | Ht 65.0 in | Wt 187.0 lb

## 2016-07-26 DIAGNOSIS — I10 Essential (primary) hypertension: Secondary | ICD-10-CM

## 2016-07-26 DIAGNOSIS — J0100 Acute maxillary sinusitis, unspecified: Secondary | ICD-10-CM | POA: Diagnosis not present

## 2016-07-26 DIAGNOSIS — J208 Acute bronchitis due to other specified organisms: Secondary | ICD-10-CM

## 2016-07-26 MED ORDER — BENZONATATE 100 MG PO CAPS: 100.0000 mg | ORAL_CAPSULE | Freq: Two times a day (BID) | ORAL | 0 refills | Status: DC | PRN

## 2016-07-26 MED ORDER — PENICILLIN V POTASSIUM 500 MG PO TABS: 500.0000 mg | ORAL_TABLET | Freq: Three times a day (TID) | ORAL | 0 refills | Status: DC

## 2016-07-26 NOTE — Progress Notes (Signed)
   Christina Carroll     MRN: [id number]      DOB: Dec 07, 1945   HPI Christina Carroll  3 week h/o worsening head and chest congestion, associated with fever and chills intermittently. Nasal drainage has thickened , and is yellowish green, and at times bloody. Sputum is thick and yellow. C/o bilateral ear pressure, denies hearing loss and sore throat. Increasing fatigue , poor appetitie and sleep disturbed by cough. No improvement with OTC medication.    ROS  Denies chest pains, palpitations and leg swelling Denies abdominal pain, nausea, vomiting,diarrhea or constipation.   Denies dysuria, frequency, hesitancy or incontinence. Denies joint pain, swelling and limitation in mobility. Denies headaches, seizures, numbness, or tingling. Denies depression, anxiety or insomnia. Denies skin break down or rash.   PE  BP 140/84   Pulse 89   Temp 98.7 F (37.1 C) (Oral)   Resp 16   Ht 5\' 5"  (1.651 m)   Wt 187 lb (84.8 kg)   SpO2 99%   BMI 31.12 kg/m   Patient alert and oriented and in no cardiopulmonary distress.  HEENT: No facial asymmetry, EOMI,   oropharynx pink and moist.  Neck supple no JVD, no mass.Maxillary sinus tenderness. TM dull with with adequate reflex  Chest: Decreased though adequate air entry scattered crackles and wheezes  CVS: S1, S2 no murmurs, no S3.Regular rate.  ABD: Soft non tender.   Ext: No edema  MS: Adequate ROM spine, shoulders, hips and knees.  Skin: Intact, no ulcerations or rash noted.  Psych: Good eye contact, normal affect. Memory intact not anxious or depressed appearing.  CNS: CN 2-12 intact, power,  normal throughout.no focal deficits noted.   Assessment & Plan  Acute bronchitis Decongestant and antibiotic  prescribed  Acute sinusitis Antibiotic course prescribed and netty pot flushes recommended  Essential hypertension Controlled, no change in medication DASH diet and commitment to daily physical activity for a minimum of  30 minutes discussed and encouraged, as a part of hypertension management. The importance of attaining a healthy weight is also discussed.  BP/Weight 07/26/2016 07/13/2016 06/01/2016 04/25/2016 02/24/2016 02/23/2016 65/07/3746  Systolic BP 270 786 754 492 010 071 219  Diastolic BP 82 76 73 74 72 76 77  Wt. (Lbs) 187 192 190 192 187 187.12 190  BMI 31.12 31.95 32.61 31.95 31.12 32.12 31.62

## 2016-07-26 NOTE — Patient Instructions (Signed)
Physical exam end May as before, call if you need me sooner  You are treated for acute sinusitis and bronchitis, penicillin and decongestants are sent in  Be safe on the roads, and happy mother/ grandma's day!

## 2016-07-26 NOTE — Assessment & Plan Note (Signed)
Decongestant and antibiotic prescribed 

## 2016-07-28 NOTE — Assessment & Plan Note (Signed)
Antibiotic course prescribed and netty pot flushes recommended

## 2016-07-28 NOTE — Assessment & Plan Note (Signed)
Controlled, no change in medication DASH diet and commitment to daily physical activity for a minimum of 30 minutes discussed and encouraged, as a part of hypertension management. The importance of attaining a healthy weight is also discussed.  BP/Weight 07/26/2016 07/13/2016 06/01/2016 04/25/2016 02/24/2016 02/23/2016 17/11/1503  Systolic BP 697 948 016 553 748 270 786  Diastolic BP 82 76 73 74 72 76 77  Wt. (Lbs) 187 192 190 192 187 187.12 190  BMI 31.12 31.95 32.61 31.95 31.12 32.12 31.62

## 2016-08-11 ENCOUNTER — Other Ambulatory Visit: Payer: Self-pay

## 2016-08-11 DIAGNOSIS — M949 Disorder of cartilage, unspecified: Secondary | ICD-10-CM

## 2016-08-11 DIAGNOSIS — R7303 Prediabetes: Secondary | ICD-10-CM

## 2016-08-11 DIAGNOSIS — E785 Hyperlipidemia, unspecified: Secondary | ICD-10-CM | POA: Diagnosis not present

## 2016-08-11 DIAGNOSIS — M899 Disorder of bone, unspecified: Secondary | ICD-10-CM | POA: Diagnosis not present

## 2016-08-11 DIAGNOSIS — I10 Essential (primary) hypertension: Secondary | ICD-10-CM | POA: Diagnosis not present

## 2016-08-11 LAB — COMPLETE METABOLIC PANEL WITH GFR
ALT: 21 U/L (ref 6–29)
AST: 22 U/L (ref 10–35)
Albumin: 4 g/dL (ref 3.6–5.1)
Alkaline Phosphatase: 95 U/L (ref 33–130)
BUN: 15 mg/dL (ref 7–25)
CHLORIDE: 100 mmol/L (ref 98–110)
CO2: 30 mmol/L (ref 20–31)
CREATININE: 0.94 mg/dL (ref 0.50–0.99)
Calcium: 9.6 mg/dL (ref 8.6–10.4)
GFR, Est African American: 72 mL/min (ref >=60)
GFR, Est Non African American: 63 mL/min (ref >=60)
GLUCOSE: 91 mg/dL (ref 65–99)
Potassium: 4.7 mmol/L (ref 3.5–5.3)
SODIUM: 137 mmol/L (ref 135–146)
TOTAL PROTEIN: 6.7 g/dL (ref 6.1–8.1)
Total Bilirubin: 0.8 mg/dL (ref 0.2–1.2)

## 2016-08-11 LAB — LIPID PANEL
Cholesterol: 180 mg/dL (ref <200)
HDL: 72 mg/dL (ref >50)
LDL CALC: 96 mg/dL (ref <100)
Total CHOL/HDL Ratio: 2.5 Ratio (ref <5.0)
Triglycerides: 61 mg/dL (ref <150)
VLDL: 12 mg/dL (ref <30)

## 2016-08-12 LAB — VITAMIN D 25 HYDROXY (VIT D DEFICIENCY, FRACTURES): VIT D 25 HYDROXY: 29 ng/mL — AB (ref 30–100)

## 2016-08-12 LAB — HEMOGLOBIN A1C
Hgb A1c MFr Bld: 5.8 % — ABNORMAL HIGH (ref <5.7)
Mean Plasma Glucose: 120 mg/dL

## 2016-08-16 ENCOUNTER — Ambulatory Visit (INDEPENDENT_AMBULATORY_CARE_PROVIDER_SITE_OTHER): Payer: Medicare Other | Admitting: Family Medicine

## 2016-08-16 ENCOUNTER — Encounter: Payer: Self-pay | Admitting: Family Medicine

## 2016-08-16 VITALS — BP 130/82 | HR 60 | Resp 16 | Ht 65.0 in | Wt 187.0 lb

## 2016-08-16 DIAGNOSIS — Z0000 Encounter for general adult medical examination without abnormal findings: Secondary | ICD-10-CM

## 2016-08-16 DIAGNOSIS — Z1211 Encounter for screening for malignant neoplasm of colon: Secondary | ICD-10-CM

## 2016-08-16 MED ORDER — VITAMIN D 50 MCG (2000 UT) PO CAPS: 1.0000 | ORAL_CAPSULE | Freq: Every day | ORAL | 3 refills | Status: DC

## 2016-08-16 NOTE — Patient Instructions (Addendum)
F/u 2nd  Week in January, call if you need me before   It is important that you exercise regularly at least 30 minutes 5 times a week. If you develop chest pain, have severe difficulty breathing, or feel very tired, stop exercising immediately and seek medical attention   Please work on good  health habits so that your health will improve. 1. Commitment to daily physical activity for 30 to 60  minutes, if you are able to do this.  2. Commitment to wise food choices. Aim for half of your  food intake to be vegetable and fruit, one quarter starchy foods, and one quarter protein. Try to eat on a regular schedule  3 meals per day, snacking between meals should be limited to vegetables or fruits or small portions of nuts. 64 ounces of water per day is generally recommended, unless you have specific health conditions, like heart failure or kidney failure where you will need to limit fluid intake.  3. Commitment to sufficient and a  good quality of physical and mental rest daily, generally between 6 to 8 hours per day.  WITH PERSISTANCE AND PERSEVERANCE, THE IMPOSSIBLE , BECOMES THE NORM!   Fasting lipid, cmp and EGFr, hBA1C , cBC, vit D early January,1 week before f/u appt , call for lab sheet in [facility] OTC vit D3 2000 IU once dialy

## 2016-08-17 ENCOUNTER — Encounter: Payer: Self-pay | Admitting: Family Medicine

## 2016-08-17 ENCOUNTER — Encounter (INDEPENDENT_AMBULATORY_CARE_PROVIDER_SITE_OTHER): Payer: Self-pay | Admitting: Orthopaedic Surgery

## 2016-08-17 ENCOUNTER — Ambulatory Visit (INDEPENDENT_AMBULATORY_CARE_PROVIDER_SITE_OTHER): Payer: Medicare Other | Admitting: Orthopaedic Surgery

## 2016-08-17 VITALS — BP 132/77 | HR 70 | Ht 65.0 in | Wt 192.0 lb

## 2016-08-17 DIAGNOSIS — M19011 Primary osteoarthritis, right shoulder: Secondary | ICD-10-CM

## 2016-08-17 DIAGNOSIS — Z0000 Encounter for general adult medical examination without abnormal findings: Secondary | ICD-10-CM | POA: Insufficient documentation

## 2016-08-17 DIAGNOSIS — Z1211 Encounter for screening for malignant neoplasm of colon: Secondary | ICD-10-CM | POA: Insufficient documentation

## 2016-08-17 NOTE — Assessment & Plan Note (Signed)

## 2016-08-17 NOTE — Progress Notes (Signed)
   Post-Op Visit Note   Patient: Christina Carroll           Date of Birth: [birth year]           MRN: [id number] Visit Date: 08/17/2016 PCP: Fayrene Helper, MD   Assessment & Plan: Status post shoulder arthroscopy with the chondroplasty removal of loose bodies for/11/2016  Chief Complaint:  Chief Complaint  Patient presents with  . Right Shoulder - Routine Post Op   Visit Diagnoses: Follow-up extensive debridement right shoulder with chondroplasty for glenohumeral arthritis removal of loose bodies.: F so where she was reaching out grabbing something the that was high did not think about and had some increased pain in her shoulder. Since that time she's had some difficulty with range of motion. We went over the use of a pulley or growth or cord drawn over the top of a door that she can work on to improve her shoulder flexion back to where it was before she had the recent pain. We discussed intraoperative findings and her shoulder. She'll work on home exercises and we will recheck her in one month.  Plan: Recheck 1 month. She'll work on her passive and active assistive range of motion exercises  Follow-Up Instructions: Return in about 1 month (around 09/16/2016).   Orders:  No orders of the defined types were placed in this encounter.  No orders of the defined types were placed in this encounter.   Imaging: No results found.  PMFS History: Patient Active Problem List   Diagnosis Date Noted  . Annual physical exam 08/17/2016  . Colon cancer screening 08/17/2016  . Primary osteoarthritis, 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  . Chondromalacia of right shoulder 06/01/2016  . Metabolic syndrome X 10/62/6948  . Seasonal allergies 07/10/2012  . Prediabetes 02/22/2012  . NEVUS 05/12/2010  . BACK PAIN 02/02/2010  . TENDINITIS 02/02/2010  . Hyperlipidemia 08/30/2007  . Obesity 08/30/2007  . Essential hypertension 08/30/2007   Past Medical History:  Diagnosis Date  . Arthritis   .  Essential hypertension   . Hyperlipidemia   . Lymphocytic thyroiditis   . Obesity   . Prediabetes     Family History  Problem Relation Age of Onset  . Stroke Mother   . Diabetes Mother   . Hypertension Mother   . Coronary artery disease Father   . Arthritis Unknown   . Cancer Unknown   . Breast cancer Maternal Aunt     Past Surgical History:  Procedure Laterality Date  . Bilateral foot surgery bone removed  2000  . COLONOSCOPY  11/30/2011   Procedure: COLONOSCOPY;  Surgeon: Rogene Houston, MD;  Location: [facility];  Service: Endoscopy;  Laterality: N/A;  830  . Lumpectomy removed from thoracic spine area  1999   Social History   Occupational History  . Employed     Social History Main Topics  . Smoking status: Never Smoker  . Smokeless tobacco: Never Used  . Alcohol use No  . Drug use: No  . Sexual activity: Not Currently

## 2016-08-17 NOTE — Progress Notes (Signed)
    Christina Carroll     MRN: [id number]      DOB: 07-Jul-1945  HPI: Patient is in for annual physical exam. No other health concerns are expressed or addressed at the visit. Recent labs, if available are reviewed. Immunization is reviewed , and  updated if needed.   PE: BP 130/82   Pulse 60   Resp 16   Ht 5\' 5"  (1.651 m)   Wt 187 lb (84.8 kg)   SpO2 100%   BMI 31.12 kg/m   Pleasant  female, alert and oriented x 3, in no cardio-pulmonary distress. Afebrile. HEENT No facial trauma or asymetry. Sinuses non tender.  Extra occullar muscles intact, pupils equally reactive to light. External ears normal, tympanic membranes clear. Oropharynx moist, no exudate. Neck: supple, no adenopathy,JVD or thyromegaly.No bruits.  Chest: Clear to ascultation bilaterally.No crackles or wheezes. Non tender to palpation  Breast: No asymetry,no masses or lumps. No tenderness. No nipple discharge or inversion. No axillary or supraclavicular adenopathy  Cardiovascular system; Heart sounds normal,  S1 and  S2 ,no S3.  No murmur, or thrill. Apical beat not displaced Peripheral pulses normal.  Abdomen: Soft, non tender, no organomegaly or masses. No bruits. Bowel sounds normal. No guarding, tenderness or rebound.  Rectal:  Normal sphincter tone. No rectal mass. Guaiac negative stool.  GU: External genitalia normal female genitalia , normal female distribution of hair. No lesions. Urethral meatus normal in size, no  Prolapse, no lesions visibly  Present. Bladder non tender. Vagina pink and moist , with no visible lesions , discharge present . Adequate pelvic support no  cystocele or rectocele noted Cervix pink and appears healthy, no lesions or ulcerations noted, no discharge noted from os Uterus normal size, no adnexal masses, no cervical motion or adnexal tenderness.   Musculoskeletal exam: Full ROM of spine, hips ,  and knees.Decreased ROM right shoulder No deformity  ,swelling or crepitus noted. No muscle wasting or atrophy.   Neurologic: Cranial nerves 2 to 12 intact. Power, tone ,sensation and reflexes normal throughout. No disturbance in gait. No tremor.  Skin: Intact, no ulceration, erythema , scaling or rash noted. Pigmentation normal throughout  Psych; Normal mood and affect. Judgement and concentration normal   Assessment & Plan:  Annual physical exam Annual exam as documented. Counseling done  re healthy lifestyle involving commitment to 150 minutes exercise per week, heart healthy diet, and attaining healthy weight.The importance of adequate sleep also discussed. Regular seat belt use and home safety, is also discussed. Changes in health habits are decided on by the patient with goals and time frames  set for achieving them. Immunization and cancer screening needs are specifically addressed at this visit.

## 2016-08-17 NOTE — Assessment & Plan Note (Signed)
Heme negative stool, no mass on exam

## 2016-08-18 LAB — POC HEMOCCULT BLD/STL (OFFICE/1-CARD/DIAGNOSTIC): FECAL OCCULT BLD: NEGATIVE

## 2016-08-18 NOTE — Addendum Note (Signed)
Addended by: Eual Fines on: 08/18/2016 10:52 AM   Modules accepted: Orders

## 2016-09-14 ENCOUNTER — Ambulatory Visit (INDEPENDENT_AMBULATORY_CARE_PROVIDER_SITE_OTHER): Payer: Medicare Other | Admitting: Orthopaedic Surgery

## 2016-09-14 ENCOUNTER — Encounter (INDEPENDENT_AMBULATORY_CARE_PROVIDER_SITE_OTHER): Payer: Self-pay | Admitting: Orthopaedic Surgery

## 2016-09-14 VITALS — BP 114/74 | HR 72 | Ht 65.0 in | Wt 190.0 lb

## 2016-09-14 DIAGNOSIS — M19011 Primary osteoarthritis, right shoulder: Secondary | ICD-10-CM

## 2016-09-14 DIAGNOSIS — M94211 Chondromalacia, right shoulder: Secondary | ICD-10-CM

## 2016-09-14 NOTE — Progress Notes (Signed)
   Post-Op Visit Note   Patient: Christina Carroll           Date of Birth: 08/27/1945           MRN: [id number] Visit Date: 09/14/2016 PCP: Fayrene Helper, MD   Assessment & Plan:  Chief Complaint:  Chief Complaint  Patient presents with  . Right Shoulder - Routine Post Op   Visit Diagnoses:  1. Chondromalacia of right shoulder   2. Primary osteoarthritis, right shoulder     Plan: Patient returns postop shoulder arthroscopy with extensive debridement for chondromalacia and removal of loose bodies. 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 She's noticed improvement in her shoulder pain symptoms. She's not had any locking. She is not on any anti-inflammatories I discussed with her she can occasionally take an anti-inflammatory. We discussed taking it regularly could potentially increase her blood pressure. Patient's happy with the surgical result and I'll check her back again on a when necessary basis.  Follow-Up Instructions: No Follow-up on file.   Orders:  No orders of the defined types were placed in this encounter.  No orders of the defined types were placed in this encounter.   Imaging: No results found.  PMFS History: Patient Active Problem List   Diagnosis Date Noted  . Annual physical exam 08/17/2016  . Colon cancer screening 08/17/2016  . Primary osteoarthritis, 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  . Chondromalacia of right shoulder 06/01/2016  . Metabolic syndrome X 38/17/7116  . Seasonal allergies 07/10/2012  . Prediabetes 02/22/2012  . NEVUS 05/12/2010  . BACK PAIN 02/02/2010  . TENDINITIS 02/02/2010  . Hyperlipidemia 08/30/2007  . Obesity 08/30/2007  . Essential hypertension 08/30/2007   Past Medical History:  Diagnosis Date  . Arthritis   . Essential hypertension   . Hyperlipidemia   . Lymphocytic thyroiditis   . Obesity   . Prediabetes     Family History  Problem Relation Age of Onset   . Stroke Mother   . Diabetes Mother   . Hypertension Mother   . Coronary artery disease Father   . Arthritis Unknown   . Cancer Unknown   . Breast cancer Maternal Aunt     Past Surgical History:  Procedure Laterality Date  . Bilateral foot surgery bone removed  2000  . COLONOSCOPY  11/30/2011   Procedure: COLONOSCOPY;  Surgeon: Rogene Houston, MD;  Location: [facility];  Service: Endoscopy;  Laterality: N/A;  830  . Lumpectomy removed from thoracic spine area  1999   Social History   Occupational History  . Employed     Social History Main Topics  . Smoking status: Never Smoker  . Smokeless tobacco: Never Used  . Alcohol use No  . Drug use: No  . Sexual activity: Not Currently

## 2016-10-10 ENCOUNTER — Other Ambulatory Visit: Payer: Self-pay | Admitting: Family Medicine

## 2016-10-11 ENCOUNTER — Other Ambulatory Visit: Payer: Self-pay | Admitting: Family Medicine

## 2016-10-11 DIAGNOSIS — Z1231 Encounter for screening mammogram for malignant neoplasm of breast: Secondary | ICD-10-CM

## 2016-11-05 ENCOUNTER — Other Ambulatory Visit: Payer: Self-pay | Admitting: Family Medicine

## 2016-11-06 ENCOUNTER — Ambulatory Visit (HOSPITAL_COMMUNITY)
Admission: RE | Admit: 2016-11-06 | Discharge: 2016-11-06 | Disposition: A | Discharge status: Home / Self Care | Payer: Medicare Other | Source: Ambulatory Visit | Attending: Family Medicine | Admitting: Family Medicine

## 2016-11-06 DIAGNOSIS — Z1231 Encounter for screening mammogram for malignant neoplasm of breast: Secondary | ICD-10-CM | POA: Diagnosis not present

## 2016-11-06 NOTE — Telephone Encounter (Signed)
Seen [phone] 

## 2016-11-22 ENCOUNTER — Other Ambulatory Visit: Payer: Self-pay | Admitting: Family Medicine

## 2017-01-01 ENCOUNTER — Emergency Department (HOSPITAL_COMMUNITY)
Admission: EM | Admit: 2017-01-01 | Discharge: 2017-01-01 | Disposition: A | Discharge status: Home / Self Care | Payer: Medicare Other | Attending: Emergency Medicine | Admitting: Emergency Medicine

## 2017-01-01 ENCOUNTER — Emergency Department (HOSPITAL_COMMUNITY): Payer: Medicare Other

## 2017-01-01 ENCOUNTER — Encounter (HOSPITAL_COMMUNITY): Payer: Self-pay | Admitting: *Deleted

## 2017-01-01 DIAGNOSIS — Y9222 Religious institution as the place of occurrence of the external cause: Secondary | ICD-10-CM | POA: Diagnosis not present

## 2017-01-01 DIAGNOSIS — Z7982 Long term (current) use of aspirin: Secondary | ICD-10-CM | POA: Diagnosis not present

## 2017-01-01 DIAGNOSIS — S0083XA Contusion of other part of head, initial encounter: Secondary | ICD-10-CM

## 2017-01-01 DIAGNOSIS — S060X1A Concussion with loss of consciousness of 30 minutes or less, initial encounter: Secondary | ICD-10-CM | POA: Diagnosis not present

## 2017-01-01 DIAGNOSIS — Y939 Activity, unspecified: Secondary | ICD-10-CM | POA: Diagnosis not present

## 2017-01-01 DIAGNOSIS — S0993XA Unspecified injury of face, initial encounter: Secondary | ICD-10-CM | POA: Diagnosis not present

## 2017-01-01 DIAGNOSIS — S0003XA Contusion of scalp, initial encounter: Secondary | ICD-10-CM | POA: Diagnosis not present

## 2017-01-01 DIAGNOSIS — W010XXA Fall on same level from slipping, tripping and stumbling without subsequent striking against object, initial encounter: Secondary | ICD-10-CM | POA: Diagnosis not present

## 2017-01-01 DIAGNOSIS — S0990XA Unspecified injury of head, initial encounter: Secondary | ICD-10-CM | POA: Diagnosis present

## 2017-01-01 DIAGNOSIS — Y999 Unspecified external cause status: Secondary | ICD-10-CM | POA: Diagnosis not present

## 2017-01-01 DIAGNOSIS — Z79899 Other long term (current) drug therapy: Secondary | ICD-10-CM | POA: Diagnosis not present

## 2017-01-01 DIAGNOSIS — I10 Essential (primary) hypertension: Secondary | ICD-10-CM | POA: Diagnosis not present

## 2017-01-01 DIAGNOSIS — R220 Localized swelling, mass and lump, head: Secondary | ICD-10-CM | POA: Diagnosis not present

## 2017-01-01 NOTE — ED Triage Notes (Signed)
Pt states that she was coming [place] steps at church when she tripped on her shoes causing her to fall hitting her right cheek area against the cement wall. C/o pain to right cheek area.

## 2017-01-02 NOTE — ED Provider Notes (Signed)
[facility] EMERGENCY DEPARTMENT Provider Note   CSN: [id number] Arrival date & time: 01/01/17  2002     History   Chief Complaint Chief Complaint  Patient presents with  . Fall    HPI Christina Carroll is a 71 y.o. female.  The history is provided by the patient, a friend and a relative.  Fall  This is a new problem. The current episode started 3 to 5 hours ago. The problem has been rapidly improving. Pertinent negatives include no chest pain, no abdominal pain, no headaches and no shortness of breath. Nothing aggravates the symptoms. Nothing relieves the symptoms.   Patient reports she was at church, coming off of choir stand when she fell forward . She thinks she tripped which caused her fall.  No proceeding cp/sob/weakness She hit right side of head/face during fall She may have brief LOC She now feels well No HA/neck/back pain No cp/sob She is not on anticoagulants  Past Medical History:  Diagnosis Date  . Arthritis   . Essential hypertension   . Hyperlipidemia   . Lymphocytic thyroiditis   . Obesity   . Prediabetes     Patient Active Problem List   Diagnosis Date Noted  . Annual physical exam 08/17/2016  . Colon cancer screening 08/17/2016  . Primary osteoarthritis, 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  . Chondromalacia of right shoulder 06/01/2016  . Metabolic syndrome X 29/92/4268  . Seasonal allergies 07/10/2012  . Prediabetes 02/22/2012  . NEVUS 05/12/2010  . BACK PAIN 02/02/2010  . TENDINITIS 02/02/2010  . Hyperlipidemia 08/30/2007  . Obesity 08/30/2007  . Essential hypertension 08/30/2007    Past Surgical History:  Procedure Laterality Date  . Bilateral foot surgery bone removed  2000  . COLONOSCOPY  11/30/2011   Procedure: COLONOSCOPY;  Surgeon: Rogene Houston, MD;  Location: [facility];  Service: Endoscopy;  Laterality: N/A;  830  . Lumpectomy removed from thoracic spine area  1999    OB History    No data available       Home  Medications    Prior to Admission medications   Medication Sig Start Date End Date Taking? Authorizing Provider  aspirin (ASPIRIN LOW DOSE) 81 MG EC tablet Take 81 mg by mouth daily.    Yes [provider]  calcium-vitamin D (OSCAL WITH D) 500-200 MG-UNIT TABS tablet TAKE (1) TABLET BY MOUTH (3) TIMES DAILY. 05/16/16  Yes Fayrene Helper, MD  Cholecalciferol (VITAMIN D) 2000 units CAPS Take 1 capsule (2,000 Units total) by mouth daily. 08/16/16  Yes Fayrene Helper, MD  NIFEdipine (PROCARDIA XL/ADALAT-CC) 60 MG 24 hr tablet TAKE 1 TABLET BY MOUTH  DAILY 11/23/16  Yes Fayrene Helper, MD  pravastatin (PRAVACHOL) 80 MG tablet TAKE 1 TABLET BY MOUTH  DAILY 11/23/16  Yes Fayrene Helper, MD  triamterene-hydrochlorothiazide [facility]) 37.5-25 MG tablet TAKE 1 TABLET BY MOUTH  DAILY 11/06/16  Yes Fayrene Helper, MD    Family History Family History  Problem Relation Age of Onset  . Stroke Mother   . Diabetes Mother   . Hypertension Mother   . Coronary artery disease Father   . Arthritis Unknown   . Cancer Unknown   . Breast cancer Maternal Aunt     Social History Social History  Substance Use Topics  . Smoking status: Never Smoker  . Smokeless tobacco: Never Used  . Alcohol use No     Allergies   Patient has no known allergies.   Review of  Systems Review of Systems  Eyes: Negative for visual disturbance.  Respiratory: Negative for shortness of breath.   Cardiovascular: Negative for chest pain.  Gastrointestinal: Negative for abdominal pain and vomiting.  Neurological: Negative for headaches.  All other systems reviewed and are negative.    Physical Exam Updated Vital Signs BP 129/69 (BP Location: Left Arm)   Pulse 74   Temp 97.9 F (36.6 C) (Oral)   Resp 16   Ht 1.651 m (5\' 5" )   Wt 86.2 kg (190 lb)   SpO2 100%   BMI 31.62 kg/m   Physical Exam CONSTITUTIONAL: Well developed/well nourished HEAD: soft tissue swelling above right eye,  otherwise intact, no stepoffs EYES: EOMI/PERRL ENMT: Mucous membranes moist, abrasion and Soft tissue swelling to right maxilla, no other signs of facial trauma NECK: supple no meningeal signs SPINE/BACK:entire spine nontender CV: S1/S2 noted, no murmurs/rubs/gallops noted LUNGS: Lungs are clear to auscultation bilaterally, no apparent distress ABDOMEN: soft, nontender  NEURO: Pt is awake/alert/appropriate, moves all extremitiesx4.  No facial droop.  GCS 15, ambulatory without difficult EXTREMITIES: pulses normal/equal, full ROM, no tenderness SKIN: warm, color normal PSYCH: no abnormalities of mood noted, alert and oriented to situation   ED Treatments / Results  Labs (all labs ordered are listed, but only abnormal results are displayed) Labs Reviewed - No data to display  EKG  EKG Interpretation None       Radiology Ct Head Wo Contrast  Result Date: 01/01/2017 CLINICAL DATA:  Patient fell and hit face and head. No reported loss of consciousness. EXAM: CT HEAD WITHOUT CONTRAST CT MAXILLOFACIAL WITHOUT CONTRAST TECHNIQUE: Multidetector CT imaging of the head and maxillofacial structures were performed using the standard protocol without intravenous contrast. Multiplanar CT image reconstructions of the maxillofacial structures were also generated. COMPARISON:  MR head 09/09/2012. FINDINGS: CT HEAD FINDINGS Brain: No evidence for acute infarction, hemorrhage, mass lesion, hydrocephalus, or extra-axial fluid. Mild atrophy. Hypoattenuation of white matter representing small vessel disease. Vascular: Calcification of the cavernous internal carotid arteries consistent with cerebrovascular atherosclerotic disease. No signs of intracranial large vessel occlusion. Skull: Normal. Negative for fracture or focal lesion. Other: Large RIGHT frontal scalp hematoma. CT MAXILLOFACIAL FINDINGS Osseous: No fracture or mandibular dislocation. No destructive process. Orbits: Negative. No postseptal  traumatic or inflammatory finding. Mild RIGHT preseptal soft tissue swelling. No foreign bodies. Sinuses: Clear.No blowout injury. Soft tissues: RIGHT-sided malar soft tissue swelling. IMPRESSION: No skull fracture or intracranial hemorrhage. Mild atrophy and small vessel disease. No facial fracture or blowout injury. RIGHT-sided facial swelling and RIGHT scalp hematoma. Electronically Signed   By: Staci Righter M.D.   On: 01/01/2017 21:39   Ct Maxillofacial Wo Contrast  Result Date: 01/01/2017 CLINICAL DATA:  Patient fell and hit face and head. No reported loss of consciousness. EXAM: CT HEAD WITHOUT CONTRAST CT MAXILLOFACIAL WITHOUT CONTRAST TECHNIQUE: Multidetector CT imaging of the head and maxillofacial structures were performed using the standard protocol without intravenous contrast. Multiplanar CT image reconstructions of the maxillofacial structures were also generated. COMPARISON:  MR head 09/09/2012. FINDINGS: CT HEAD FINDINGS Brain: No evidence for acute infarction, hemorrhage, mass lesion, hydrocephalus, or extra-axial fluid. Mild atrophy. Hypoattenuation of white matter representing small vessel disease. Vascular: Calcification of the cavernous internal carotid arteries consistent with cerebrovascular atherosclerotic disease. No signs of intracranial large vessel occlusion. Skull: Normal. Negative for fracture or focal lesion. Other: Large RIGHT frontal scalp hematoma. CT MAXILLOFACIAL FINDINGS Osseous: No fracture or mandibular dislocation. No destructive process. Orbits: Negative. No postseptal traumatic  or inflammatory finding. Mild RIGHT preseptal soft tissue swelling. No foreign bodies. Sinuses: Clear.No blowout injury. Soft tissues: RIGHT-sided malar soft tissue swelling. IMPRESSION: No skull fracture or intracranial hemorrhage. Mild atrophy and small vessel disease. No facial fracture or blowout injury. RIGHT-sided facial swelling and RIGHT scalp hematoma. Electronically Signed   By: Staci Righter M.D.   On: 01/01/2017 21:39    Procedures Procedures (including critical care time)  Medications Ordered in ED Medications - No data to display   Initial Impression / Assessment and Plan / ED Course  I have reviewed the triage vital signs and the nursing notes.  Pertinent  imaging results that were available during my care of the patient were reviewed by me and considered in my medical decision making (see chart for details).     Pt stable Neuro intact Well appearing D/c home   Final Clinical Impressions(s) / ED Diagnoses   Final diagnoses:  Concussion with loss of consciousness of 30 minutes or less, initial encounter  Contusion of face, initial encounter  Contusion of scalp, initial encounter    New Prescriptions Discharge Medication List as of 01/01/2017 11:31 PM       Ripley Fraise, MD 01/02/17 0023

## 2017-01-18 ENCOUNTER — Ambulatory Visit (INDEPENDENT_AMBULATORY_CARE_PROVIDER_SITE_OTHER): Payer: Medicare Other

## 2017-01-18 DIAGNOSIS — Z23 Encounter for immunization: Secondary | ICD-10-CM

## 2017-02-20 ENCOUNTER — Encounter: Payer: Self-pay | Admitting: Family Medicine

## 2017-03-21 ENCOUNTER — Ambulatory Visit: Payer: Medicare Other | Admitting: Family Medicine

## 2017-03-26 ENCOUNTER — Telehealth: Payer: Self-pay

## 2017-03-26 DIAGNOSIS — E785 Hyperlipidemia, unspecified: Secondary | ICD-10-CM

## 2017-03-26 DIAGNOSIS — E8881 Metabolic syndrome: Secondary | ICD-10-CM

## 2017-03-26 DIAGNOSIS — R7303 Prediabetes: Secondary | ICD-10-CM

## 2017-03-26 DIAGNOSIS — E559 Vitamin D deficiency, unspecified: Secondary | ICD-10-CM

## 2017-03-26 NOTE — Telephone Encounter (Signed)
labs ordered

## 2017-03-29 DIAGNOSIS — E785 Hyperlipidemia, unspecified: Secondary | ICD-10-CM | POA: Diagnosis not present

## 2017-03-29 DIAGNOSIS — E8881 Metabolic syndrome: Secondary | ICD-10-CM | POA: Diagnosis not present

## 2017-03-29 DIAGNOSIS — E559 Vitamin D deficiency, unspecified: Secondary | ICD-10-CM | POA: Diagnosis not present

## 2017-03-30 LAB — COMPLETE METABOLIC PANEL WITH GFR
AG RATIO: 1.5 (calc) (ref 1.0–2.5)
ALT: 27 U/L (ref 6–29)
AST: 25 U/L (ref 10–35)
Albumin: 4.4 g/dL (ref 3.6–5.1)
Alkaline phosphatase (APISO): 89 U/L (ref 33–130)
BILIRUBIN TOTAL: 0.9 mg/dL (ref 0.2–1.2)
BUN/Creatinine Ratio: 15 (calc) (ref 6–22)
BUN: 16 mg/dL (ref 7–25)
CALCIUM: 10.2 mg/dL (ref 8.6–10.4)
CHLORIDE: 99 mmol/L (ref 98–110)
CO2: 33 mmol/L — ABNORMAL HIGH (ref 20–32)
Creat: 1.09 mg/dL — ABNORMAL HIGH (ref 0.50–0.99)
GFR, Est African American: 60 mL/min/{1.73_m2} (ref >=60)
GFR, Est Non African American: 52 mL/min/{1.73_m2} — ABNORMAL LOW (ref >=60)
GLUCOSE: 90 mg/dL (ref 65–99)
Globulin: 2.9 g/dL (calc) (ref 1.9–3.7)
POTASSIUM: 4.4 mmol/L (ref 3.5–5.3)
Sodium: 140 mmol/L (ref 135–146)
Total Protein: 7.3 g/dL (ref 6.1–8.1)

## 2017-03-30 LAB — LIPID PANEL
Cholesterol: 198 mg/dL (ref <200)
HDL: 70 mg/dL (ref >50)
LDL Cholesterol (Calc): 109 mg/dL (calc) — ABNORMAL HIGH
Non-HDL Cholesterol (Calc): 128 mg/dL (calc) (ref <130)
TRIGLYCERIDES: 99 mg/dL (ref <150)
Total CHOL/HDL Ratio: 2.8 (calc) (ref <5.0)

## 2017-03-30 LAB — VITAMIN D 25 HYDROXY (VIT D DEFICIENCY, FRACTURES): Vit D, 25-Hydroxy: 45 ng/mL (ref 30–100)

## 2017-04-04 ENCOUNTER — Encounter: Payer: Self-pay | Admitting: Family Medicine

## 2017-04-04 ENCOUNTER — Ambulatory Visit (INDEPENDENT_AMBULATORY_CARE_PROVIDER_SITE_OTHER): Payer: Medicare Other | Admitting: Family Medicine

## 2017-04-04 VITALS — BP 120/76 | HR 75 | Resp 16 | Ht 65.0 in | Wt 185.0 lb

## 2017-04-04 DIAGNOSIS — E785 Hyperlipidemia, unspecified: Secondary | ICD-10-CM | POA: Diagnosis not present

## 2017-04-04 DIAGNOSIS — I10 Essential (primary) hypertension: Secondary | ICD-10-CM | POA: Diagnosis not present

## 2017-04-04 DIAGNOSIS — Z723 Lack of physical exercise: Secondary | ICD-10-CM | POA: Insufficient documentation

## 2017-04-04 DIAGNOSIS — R7301 Impaired fasting glucose: Secondary | ICD-10-CM

## 2017-04-04 DIAGNOSIS — R7303 Prediabetes: Secondary | ICD-10-CM | POA: Diagnosis not present

## 2017-04-04 NOTE — Addendum Note (Signed)
Addended by: Eual Fines on: 04/04/2017 09:06 AM   Modules accepted: Orders

## 2017-04-04 NOTE — Assessment & Plan Note (Signed)
Discussed the need to commit to regular exercise

## 2017-04-04 NOTE — Assessment & Plan Note (Signed)
Improved Patient re-educated about  the importance of commitment to a  minimum of 150 minutes of exercise per week.  The importance of healthy food choices with portion control discussed. Encouraged to start a food diary, count calories and to consider  joining a support group. Sample diet sheets offered. Goals set by the patient for the next several months.   Weight /BMI 04/04/2017 01/01/2017 09/14/2016  WEIGHT 185 lb 190 lb 190 lb  HEIGHT 5\' 5"  5\' 5"  5\' 5"   BMI 30.79 kg/m2 31.62 kg/m2 31.62 kg/m2

## 2017-04-04 NOTE — Assessment & Plan Note (Signed)
Controlled, no change in medication DASH diet and commitment to daily physical activity for a minimum of 30 minutes discussed and encouraged, as a part of hypertension management. The importance of attaining a healthy weight is also discussed.  BP/Weight 04/04/2017 01/01/2017 09/14/2016 08/17/2016 08/16/2016 07/26/2016 5/99/2341  Systolic BP 443 601 658 006 349 494 473  Diastolic BP 76 69 74 77 82 82 76  Wt. (Lbs) 185 190 190 192 187 187 192  BMI 30.79 31.62 31.62 31.95 31.12 31.12 31.95

## 2017-04-04 NOTE — Patient Instructions (Addendum)
Physical exam early Jubne, call if you need me soonrer  Congrats on weight loss and excellent blood pressure  Please reduce fat in diet, and starchy foods, try to eliminate flour  64 ounces water  It is important that you exercise regularly at least 30 minutes 5 times a week. If you develop chest pain, have severe difficulty breathing, or feel very tired, stop exercising immediately and seek medical attention    Fasting lipid, cmp and EGFr, CBC and HBA1C end May  Thank you  for choosing [facility]. We consider it a privelige to serve you.  Delivering excellent health care in a caring and  compassionate way is our goal.  Partnering with you,  so that together we can achieve this goal is our strategy.   Best for 2019!

## 2017-04-04 NOTE — Assessment & Plan Note (Signed)
Hyperlipidemia:Low fat diet discussed and encouraged.   Lipid Panel  Lab Results  Component Value Date   CHOL 198 03/29/2017   HDL 70 03/29/2017   LDLCALC 96 08/11/2016   TRIG 99 03/29/2017   CHOLHDL 2.8 03/29/2017   Controlled, no change in medication

## 2017-04-04 NOTE — Progress Notes (Signed)
   Christina Carroll     MRN: [id number]      DOB: [birth year]   HPI Christina Carroll is here for follow up and re-evaluation of chronic medical conditions, medication management and review of any available recent lab and radiology data.  Preventive health is updated, specifically  Cancer screening and Immunization.   Questions or concerns regarding consultations or procedures which the PT has had in the interim are  addressed. The PT denies any adverse reactions to current medications since the last visit.  There are no new concerns.  There are no specific complaints   ROS Denies recent fever or chills. Denies sinus pressure, nasal congestion, ear pain or sore throat. Denies chest congestion, productive cough or wheezing. Denies chest pains, palpitations and leg swelling Denies abdominal pain, nausea, vomiting,diarrhea or constipation.   Denies dysuria, frequency, hesitancy or incontinence. Denies joint pain, swelling and limitation in mobility. Denies headaches, seizures, numbness, or tingling. Denies depression, anxiety or insomnia. Denies skin break down or rash.   PE  BP 120/76   Pulse 75   Resp 16   Ht 5\' 5"  (1.651 m)   Wt 185 lb (83.9 kg)   SpO2 98%   BMI 30.79 kg/m   Patient alert and oriented and in no cardiopulmonary distress.  HEENT: No facial asymmetry, EOMI,   oropharynx pink and moist.  Neck supple no JVD, no mass.  Chest: Clear to auscultation bilaterally.  CVS: S1, S2 no murmurs, no S3.Regular rate.  ABD: Soft non tender.   Ext: No edema  MS: Adequate ROM spine, shoulders, hips and knees.  Skin: Intact, no ulcerations or rash noted.  Psych: Good eye contact, normal affect. Memory intact not anxious or depressed appearing.  CNS: CN 2-12 intact, power,  normal throughout.no focal deficits noted.   Assessment & Plan  Essential hypertension Controlled, no change in medication DASH diet and commitment to daily physical activity for a minimum of  30 minutes discussed and encouraged, as a part of hypertension management. The importance of attaining a healthy weight is also discussed.  BP/Weight 04/04/2017 01/01/2017 09/14/2016 08/17/2016 08/16/2016 07/26/2016 1/75/1025  Systolic BP 852 778 242 353 614 431 540  Diastolic BP 76 69 74 77 82 82 76  Wt. (Lbs) 185 190 190 192 187 187 192  BMI 30.79 31.62 31.62 31.95 31.12 31.12 31.95       Hyperlipidemia Hyperlipidemia:Low fat diet discussed and encouraged.   Lipid Panel  Lab Results  Component Value Date   CHOL 198 03/29/2017   HDL 70 03/29/2017   LDLCALC 96 08/11/2016   TRIG 99 03/29/2017   CHOLHDL 2.8 03/29/2017   Controlled, no change in medication     Obesity Improved Patient re-educated about  the importance of commitment to a  minimum of 150 minutes of exercise per week.  The importance of healthy food choices with portion control discussed. Encouraged to start a food diary, count calories and to consider  joining a support group. Sample diet sheets offered. Goals set by the patient for the next several months.   Weight /BMI 04/04/2017 01/01/2017 09/14/2016  WEIGHT 185 lb 190 lb 190 lb  HEIGHT 5\' 5"  5\' 5"  5\' 5"   BMI 30.79 kg/m2 31.62 kg/m2 31.62 kg/m2      Lack of exercise Discussed the need to commit to regular exercise

## 2017-04-08 DIAGNOSIS — H1132 Conjunctival hemorrhage, left eye: Secondary | ICD-10-CM | POA: Diagnosis not present

## 2017-05-15 ENCOUNTER — Other Ambulatory Visit: Payer: Self-pay | Admitting: Family Medicine

## 2017-05-21 ENCOUNTER — Other Ambulatory Visit: Payer: Self-pay | Admitting: Family Medicine

## 2017-05-22 DIAGNOSIS — E063 Autoimmune thyroiditis: Secondary | ICD-10-CM | POA: Diagnosis not present

## 2017-05-22 DIAGNOSIS — E049 Nontoxic goiter, unspecified: Secondary | ICD-10-CM | POA: Diagnosis not present

## 2017-05-29 DIAGNOSIS — E785 Hyperlipidemia, unspecified: Secondary | ICD-10-CM | POA: Diagnosis not present

## 2017-05-29 DIAGNOSIS — E063 Autoimmune thyroiditis: Secondary | ICD-10-CM | POA: Diagnosis not present

## 2017-05-29 DIAGNOSIS — E049 Nontoxic goiter, unspecified: Secondary | ICD-10-CM | POA: Diagnosis not present

## 2017-05-29 DIAGNOSIS — I10 Essential (primary) hypertension: Secondary | ICD-10-CM | POA: Diagnosis not present

## 2017-05-29 DIAGNOSIS — E039 Hypothyroidism, unspecified: Secondary | ICD-10-CM | POA: Diagnosis not present

## 2017-06-04 ENCOUNTER — Ambulatory Visit (INDEPENDENT_AMBULATORY_CARE_PROVIDER_SITE_OTHER): Payer: Medicare Other

## 2017-06-04 ENCOUNTER — Encounter: Payer: Medicare Other | Admitting: Family Medicine

## 2017-06-04 VITALS — BP 130/82 | HR 96 | Temp 98.2°F | Resp 16 | Ht 65.0 in | Wt 185.2 lb

## 2017-06-04 DIAGNOSIS — Z0000 Encounter for general adult medical examination without abnormal findings: Secondary | ICD-10-CM | POA: Diagnosis not present

## 2017-06-04 NOTE — Progress Notes (Signed)
Subjective:   Christina Carroll is a 72 y.o. female who presents for Medicare Annual (Subsequent) preventive examination.  Review of Systems:         Objective:     Vitals: There were no vitals taken for this visit.  There is no height or weight on file to calculate BMI.  Advanced Directives 01/01/2017 04/25/2016 12/23/2015 08/10/2015 11/30/2011  Does Patient Have a Medical Advance Directive? No Yes Yes No Patient has advance directive, copy not in chart  Type of Advance Directive - Healthcare Power of [place]  Does patient want to make changes to medical advance directive? - Yes (MAU/Ambulatory/Procedural Areas - Information given) No - Patient declined - -  Copy of Christina Carroll in Chart? - No - copy requested No - copy requested - -  Pre-existing out of facility DNR order (yellow form or pink MOST form) - - - - No    Tobacco Social History   Tobacco Use  Smoking Status Never Smoker  Smokeless Tobacco Never Used     Counseling given: Not Answered   Clinical Intake:                       Past Medical History:  Diagnosis Date  . Arthritis   . Essential hypertension   . Hyperlipidemia   . Lymphocytic thyroiditis   . Obesity   . Prediabetes    Past Surgical History:  Procedure Laterality Date  . Bilateral foot surgery bone removed  2000  . COLONOSCOPY  11/30/2011   Procedure: COLONOSCOPY;  Surgeon: Rogene Houston, MD;  Location: [facility];  Service: Endoscopy;  Laterality: N/A;  830  . Lumpectomy removed from thoracic spine area  1999   Family History  Problem Relation Age of Onset  . Stroke Mother   . Diabetes Mother   . Hypertension Mother   . Coronary artery disease Father   . Arthritis Unknown   . Cancer Unknown   . Breast cancer Maternal Aunt    Social History   Socioeconomic History  . Marital status: Divorced    Spouse name: Not on file  . Number of  children: 1  . Years of education: Not on file  . Highest education level: Not on file  Social Needs  . Financial resource strain: Not on file  . Food insecurity - worry: Not on file  . Food insecurity - inability: Not on file  . Transportation needs - medical: Not on file  . Transportation needs - non-medical: Not on file  Occupational History  . Occupation: Employed   Tobacco Use  . Smoking status: Never Smoker  . Smokeless tobacco: Never Used  Substance and Sexual Activity  . Alcohol use: No    Alcohol/week: 0.0 oz  . Drug use: No  . Sexual activity: Not Currently  Other Topics Concern  . Not on file  Social History Narrative  . Not on file    Outpatient Encounter Medications as of 06/04/2017  Medication Sig  . aspirin (ASPIRIN LOW DOSE) 81 MG EC tablet Take 81 mg by mouth daily.   . calcium-vitamin D (OSCAL WITH D) 500-200 MG-UNIT TABS tablet TAKE (1) TABLET BY MOUTH (3) TIMES DAILY.  Marland Kitchen Cholecalciferol (VITAMIN D) 2000 units CAPS Take 1 capsule (2,000 Units total) by mouth daily.  Marland Kitchen NIFEdipine (PROCARDIA XL/ADALAT-CC) 60 MG 24 hr tablet TAKE 1 TABLET BY MOUTH  DAILY  .  pravastatin (PRAVACHOL) 80 MG tablet TAKE 1 TABLET BY MOUTH  DAILY  . triamterene-hydrochlorothiazide (MAXZIDE-25) 37.5-25 MG tablet TAKE 1 TABLET BY MOUTH  DAILY   No facility-administered encounter medications on file as of 06/04/2017.     Activities of Daily Living No flowsheet data found.  Patient Care Team: Fayrene Helper, MD as PCP - General Morayati, Lourdes Sledge, MD as Attending Physician (Endocrinology) Marybelle Killings, MD as Consulting Physician (Orthopedic Surgery)    Assessment:   This is a routine wellness examination for Christina Carroll.  Exercise Activities and Dietary recommendations    Goals    . Exercise 3x per week (30 min per time)     Patient would like to start exercising 3 times a week for at least 30 minutes at a time.        Fall Risk Fall Risk  04/25/2016 03/09/2015  03/09/2015 06/29/2014 02/02/2014  Falls in the past year? Yes No No No No  Number falls in past yr: 1 - - - -  Injury with Fall? No - - - -  Follow up Falls evaluation completed - - - -   Is the patient's home free of loose throw rugs in walkways, pet beds, electrical cords, etc?   yes      Grab bars in the bathroom? no      Handrails on the stairs?   No stairs      Adequate lighting?   yes   Depression Screen PHQ 2/9 Scores 04/25/2016 03/09/2015 03/09/2015 04/03/2013  PHQ - 2 Score 0 0 4 0  PHQ- 9 Score - 0 12 0     Cognitive Function     6CIT Screen 04/25/2016  What Year? 0 points  What month? 0 points  What time? 0 points  Count back from 20 0 points  Months in reverse 0 points  Repeat phrase 0 points  Total Score 0    Immunization History  Administered Date(s) Administered  . Influenza Split 02/21/2011, 01/31/2012  . Influenza Whole 01/23/2007, 12/21/2008, 02/02/2010  . Influenza,inj,Quad PF,6+ Mos 12/24/2013, 01/18/2015, 12/01/2015, 01/18/2017  . Pneumococcal Conjugate-13 09/22/2013  . Pneumococcal Polysaccharide-23 04/03/2013  . Td 04/10/2005  . Tdap 06/29/2014  . Zoster 04/19/2009    Qualifies for Shingles Vaccine? Yes, we have discussed updating to Shingrix  Screening Tests Health Maintenance  Topic Date Due  . MAMMOGRAM  11/07/2018  . COLONOSCOPY  11/29/2021  . TETANUS/TDAP  06/28/2024  . INFLUENZA VACCINE  Completed  . DEXA SCAN  Completed  . Hepatitis C Screening  Completed  . PNA vac Low Risk Adult  Completed    Cancer Screenings: Lung: Low Dose CT Chest recommended if Age 14-80 years, 30 pack-year currently smoking OR have quit w/in 15years. Patient does not qualify. Breast:  Up to date on Mammogram? Yes   Up to date of Bone Density/Dexa? No Colorectal: Up to date      Plan:      I have personally reviewed and noted the following in the patient's chart:   . Medical and social history . Use of alcohol, tobacco or illicit drugs  . Current  medications and supplements . Functional ability and status . Nutritional status . Physical activity . Advanced directives . List of other physicians . Hospitalizations, surgeries, and ER visits in previous 12 months . Vitals . Screenings to include cognitive, depression, and falls . Referrals and appointments  In addition, I have reviewed and discussed with patient certain preventive protocols, quality metrics, and  best practice recommendations. A written personalized care plan for preventive services as well as general preventive health recommendations were provided to patient.     Merceda Elks, LPN  06/06/2332

## 2017-06-04 NOTE — Patient Instructions (Signed)
Christina Carroll , Thank you for taking time to come for your Medicare Wellness Visit. I appreciate your ongoing commitment to your health goals. Please review the following plan we discussed and let me know if I can assist you in the future.   Screening recommendations/referrals: Colonoscopy: 2023 Mammogram: 10/2017 Bone Density: Ordered Recommended yearly ophthalmology/optometry visit for glaucoma screening and checkup Recommended yearly dental visit for hygiene and checkup  Vaccinations: Influenza vaccine: 2019 Pneumococcal vaccine: 2020 Tdap vaccine: 2026 Shingles vaccine: Discuss updating to Shingrix with Dr. Moshe Cipro    Advanced directives: Discussed in office  Conditions/risks identified: None  Next appointment: 08/23/68   Preventive Care 70 Years and Older, Female Preventive care refers to lifestyle choices and visits with your health care provider that can promote health and wellness. What does preventive care include?  A yearly physical exam. This is also called an annual well check.  Dental exams once or twice a year.  Routine eye exams. Ask your health care provider how often you should have your eyes checked.  Personal lifestyle choices, including:  Daily care of your teeth and gums.  Regular physical activity.  Eating a healthy diet.  Avoiding tobacco and drug use.  Limiting alcohol use.  Practicing safe sex.  Taking low-dose aspirin every day.  Taking vitamin and mineral supplements as recommended by your health care provider. What happens during an annual well check? The services and screenings done by your health care provider during your annual well check will depend on your age, overall health, lifestyle risk factors, and family history of disease. Counseling  Your health care provider may ask you questions about your:  Alcohol use.  Tobacco use.  Drug use.  Emotional well-being.  Home and relationship well-being.  Sexual  activity.  Eating habits.  History of falls.  Memory and ability to understand (cognition).  Work and work Statistician.  Reproductive health. Screening  You may have the following tests or measurements:  Height, weight, and BMI.  Blood pressure.  Lipid and cholesterol levels. These may be checked every 5 years, or more frequently if you are over 70 years old.  Skin check.  Lung cancer screening. You may have this screening every year starting at age 70 if you have a 30-pack-year history of smoking and currently smoke or have quit within the past 15 years.  Fecal occult blood test (FOBT) of the stool. You may have this test every year starting at age 70.  Flexible sigmoidoscopy or colonoscopy. You may have a sigmoidoscopy every 5 years or a colonoscopy every 10 years starting at age 70.  Hepatitis C blood test.  Hepatitis B blood test.  Sexually transmitted disease (STD) testing.  Diabetes screening. This is done by checking your blood sugar (glucose) after you have not eaten for a while (fasting). You may have this done every 1-3 years.  Bone density scan. This is done to screen for osteoporosis. You may have this done starting at age 70.  Mammogram. This may be done every 1-2 years. Talk to your health care provider about how often you should have regular mammograms. Talk with your health care provider about your test results, treatment options, and if necessary, the need for more tests. Vaccines  Your health care provider may recommend certain vaccines, such as:  Influenza vaccine. This is recommended every year.  Tetanus, diphtheria, and acellular pertussis (Tdap, Td) vaccine. You may need a Td booster every 10 years.  Zoster vaccine. You may need this after age  70.  Pneumococcal 13-valent conjugate (PCV13) vaccine. One dose is recommended after age 79.  Pneumococcal polysaccharide (PPSV23) vaccine. One dose is recommended after age 5. Talk to your health care  provider about which screenings and vaccines you need and how often you need them. This information is not intended to replace advice given to you by your health care provider. 70 Make sure you discuss any questions you have with your health care provider. Document Released: 04/02/2015 Document Revised: 11/24/2015 Document Reviewed: 01/05/2015 Elsevier Interactive Patient Education  2017 [place] Prevention in the Home Falls can cause injuries. They can happen to people of all ages. There are many things you can do to make your home safe and to help prevent falls. What can I do on the outside of my home?  Regularly fix the edges of walkways and driveways and fix any cracks.  Remove anything that might make you trip as you walk through a door, such as a raised step or threshold.  Trim any bushes or trees on the path to your home.  Use bright outdoor lighting.  Clear any walking paths of anything that might make someone trip, such as rocks or tools.  Regularly check to see if handrails are loose or broken. Make sure that both sides of any steps have handrails.  Any raised decks and porches should have guardrails on the edges.  Have any leaves, snow, or ice cleared regularly.  Use sand or salt on walking paths during winter.  Clean up any spills in your garage right away. This includes oil or grease spills. What can I do in the bathroom?  Use night lights.  Install grab bars by the toilet and in the tub and shower. Do not use towel bars as grab bars.  Use non-skid mats or decals in the tub or shower.  If you need to sit down in the shower, use a plastic, non-slip stool.  Keep the floor dry. Clean up any water that spills on the floor as soon as it happens.  Remove soap buildup in the tub or shower regularly.  Attach bath mats securely with double-sided non-slip rug tape.  Do not have throw rugs and other things on the floor that can make you trip. What can I do in  the bedroom?  Use night lights.  Make sure that you have a light by your bed that is easy to reach.  Do not use any sheets or blankets that are too big for your bed. They should not hang down onto the floor.  Have a firm chair that has side arms. You can use this for support while you get dressed.  Do not have throw rugs and other things on the floor that can make you trip. What can I do in the kitchen?  Clean up any spills right away.  Avoid walking on wet floors.  Keep items that you use a lot in easy-to-reach places.  If you need to reach something above you, use a strong step stool that has a grab bar.  Keep electrical cords out of the way.  Do not use floor polish or wax that makes floors slippery. If you must use wax, use non-skid floor wax.  Do not have throw rugs and other things on the floor that can make you trip. What can I do with my stairs?  Do not leave any items on the stairs.  Make sure that there are handrails on both sides of the stairs and  use them. Fix handrails that are broken or loose. Make sure that handrails are as long as the stairways.  Check any carpeting to make sure that it is firmly attached to the stairs. Fix any carpet that is loose or worn.  Avoid having throw rugs at the top or bottom of the stairs. If you do have throw rugs, attach them to the floor with carpet tape.  Make sure that you have a light switch at the top of the stairs and the bottom of the stairs. If you do not have them, ask someone to add them for you. What else can I do to help prevent falls?  Wear shoes that:  Do not have high heels.  Have rubber bottoms.  Are comfortable and fit you well.  Are closed at the toe. Do not wear sandals.  If you use a stepladder:  Make sure that it is fully opened. Do not climb a closed stepladder.  Make sure that both sides of the stepladder are locked into place.  Ask someone to hold it for you, if possible.  Clearly mark and  make sure that you can see:  Any grab bars or handrails.  First and last steps.  Where the edge of each step is.  Use tools that help you move around (mobility aids) if they are needed. These include:  Canes.  Walkers.  Scooters.  Crutches.  Turn on the lights when you go into a dark area. Replace any light bulbs as soon as they burn out.  Set up your furniture so you have a clear path. Avoid moving your furniture around.  If any of your floors are uneven, fix them.  If there are any pets around you, be aware of where they are.  Review your medicines with your doctor. Some medicines can make you feel dizzy. This can increase your chance of falling. Ask your doctor what other things that you can do to help prevent falls. This information is not intended to replace advice given to you by your health care provider. 70 Make sure you discuss any questions you have with your health care provider. Document Released: 12/31/2008 Document Revised: 08/12/2015 Document Reviewed: 04/10/2014 Elsevier Interactive Patient Education  2017 [employer].

## 2017-06-05 ENCOUNTER — Ambulatory Visit (INDEPENDENT_AMBULATORY_CARE_PROVIDER_SITE_OTHER): Payer: Medicare Other

## 2017-06-05 DIAGNOSIS — Z23 Encounter for immunization: Secondary | ICD-10-CM | POA: Diagnosis not present

## 2017-06-05 NOTE — Progress Notes (Signed)
Injection given with no complications 

## 2017-06-08 ENCOUNTER — Telehealth: Payer: Self-pay | Admitting: Family Medicine

## 2017-06-08 NOTE — Telephone Encounter (Signed)
Insurance will cover [employer] -

## 2017-06-19 ENCOUNTER — Other Ambulatory Visit: Payer: Self-pay | Admitting: Family Medicine

## 2017-06-26 DIAGNOSIS — E785 Hyperlipidemia, unspecified: Secondary | ICD-10-CM | POA: Diagnosis not present

## 2017-06-26 DIAGNOSIS — I10 Essential (primary) hypertension: Secondary | ICD-10-CM | POA: Diagnosis not present

## 2017-06-26 DIAGNOSIS — E063 Autoimmune thyroiditis: Secondary | ICD-10-CM | POA: Diagnosis not present

## 2017-06-26 DIAGNOSIS — E049 Nontoxic goiter, unspecified: Secondary | ICD-10-CM | POA: Diagnosis not present

## 2017-06-26 DIAGNOSIS — E039 Hypothyroidism, unspecified: Secondary | ICD-10-CM | POA: Diagnosis not present

## 2017-07-03 DIAGNOSIS — E063 Autoimmune thyroiditis: Secondary | ICD-10-CM | POA: Diagnosis not present

## 2017-07-03 DIAGNOSIS — E049 Nontoxic goiter, unspecified: Secondary | ICD-10-CM | POA: Diagnosis not present

## 2017-07-03 DIAGNOSIS — I10 Essential (primary) hypertension: Secondary | ICD-10-CM | POA: Diagnosis not present

## 2017-07-03 DIAGNOSIS — E039 Hypothyroidism, unspecified: Secondary | ICD-10-CM | POA: Diagnosis not present

## 2017-07-03 DIAGNOSIS — E785 Hyperlipidemia, unspecified: Secondary | ICD-10-CM | POA: Diagnosis not present

## 2017-07-11 DIAGNOSIS — E039 Hypothyroidism, unspecified: Secondary | ICD-10-CM | POA: Diagnosis not present

## 2017-07-11 DIAGNOSIS — I10 Essential (primary) hypertension: Secondary | ICD-10-CM | POA: Diagnosis not present

## 2017-07-11 DIAGNOSIS — E049 Nontoxic goiter, unspecified: Secondary | ICD-10-CM | POA: Diagnosis not present

## 2017-07-11 DIAGNOSIS — E785 Hyperlipidemia, unspecified: Secondary | ICD-10-CM | POA: Diagnosis not present

## 2017-07-11 DIAGNOSIS — E063 Autoimmune thyroiditis: Secondary | ICD-10-CM | POA: Diagnosis not present

## 2017-08-15 ENCOUNTER — Other Ambulatory Visit: Payer: Self-pay | Admitting: Family Medicine

## 2017-08-23 ENCOUNTER — Encounter: Payer: Medicare Other | Admitting: Family Medicine

## 2017-09-06 ENCOUNTER — Ambulatory Visit (INDEPENDENT_AMBULATORY_CARE_PROVIDER_SITE_OTHER): Payer: Medicare Other

## 2017-09-06 DIAGNOSIS — Z23 Encounter for immunization: Secondary | ICD-10-CM

## 2017-09-06 NOTE — Progress Notes (Signed)
2nd shingrix received with no complications

## 2017-10-04 ENCOUNTER — Other Ambulatory Visit: Payer: Self-pay | Admitting: Family Medicine

## 2017-10-04 DIAGNOSIS — Z1231 Encounter for screening mammogram for malignant neoplasm of breast: Secondary | ICD-10-CM

## 2017-10-09 ENCOUNTER — Telehealth: Payer: Self-pay

## 2017-10-09 DIAGNOSIS — E785 Hyperlipidemia, unspecified: Secondary | ICD-10-CM

## 2017-10-09 DIAGNOSIS — I10 Essential (primary) hypertension: Secondary | ICD-10-CM

## 2017-10-09 DIAGNOSIS — R7303 Prediabetes: Secondary | ICD-10-CM

## 2017-10-09 NOTE — Telephone Encounter (Signed)
Lab order sent to [facility]

## 2017-10-10 DIAGNOSIS — R7303 Prediabetes: Secondary | ICD-10-CM | POA: Diagnosis not present

## 2017-10-10 DIAGNOSIS — E785 Hyperlipidemia, unspecified: Secondary | ICD-10-CM | POA: Diagnosis not present

## 2017-10-10 DIAGNOSIS — I10 Essential (primary) hypertension: Secondary | ICD-10-CM | POA: Diagnosis not present

## 2017-10-11 LAB — COMPLETE METABOLIC PANEL WITH GFR
AG Ratio: 1.7 (calc) (ref 1.0–2.5)
ALKALINE PHOSPHATASE (APISO): 87 U/L (ref 33–130)
ALT: 19 U/L (ref 6–29)
AST: 20 U/L (ref 10–35)
Albumin: 4.4 g/dL (ref 3.6–5.1)
BUN: 22 mg/dL (ref 7–25)
CHLORIDE: 100 mmol/L (ref 98–110)
CO2: 32 mmol/L (ref 20–32)
CREATININE: 0.91 mg/dL (ref 0.50–0.99)
Calcium: 10 mg/dL (ref 8.6–10.4)
GFR, Est African American: 75 mL/min/{1.73_m2} (ref >=60)
GFR, Est Non African American: 64 mL/min/{1.73_m2} (ref >=60)
GLUCOSE: 84 mg/dL (ref 65–99)
Globulin: 2.6 g/dL (calc) (ref 1.9–3.7)
Potassium: 4.1 mmol/L (ref 3.5–5.3)
Sodium: 139 mmol/L (ref 135–146)
Total Bilirubin: 0.7 mg/dL (ref 0.2–1.2)
Total Protein: 7 g/dL (ref 6.1–8.1)

## 2017-10-11 LAB — LIPID PANEL
Cholesterol: 210 mg/dL — ABNORMAL HIGH (ref <200)
HDL: 74 mg/dL (ref >50)
LDL CHOLESTEROL (CALC): 120 mg/dL — AB
NON-HDL CHOLESTEROL (CALC): 136 mg/dL — AB (ref <130)
Total CHOL/HDL Ratio: 2.8 (calc) (ref <5.0)
Triglycerides: 72 mg/dL (ref <150)

## 2017-10-11 LAB — CBC
HEMATOCRIT: 40.2 % (ref 35.0–45.0)
HEMOGLOBIN: 13.4 g/dL (ref 11.7–15.5)
MCH: 26.6 pg — ABNORMAL LOW (ref 27.0–33.0)
MCHC: 33.3 g/dL (ref 32.0–36.0)
MCV: 79.8 fL — ABNORMAL LOW (ref 80.0–100.0)
MPV: 10.8 fL (ref 7.5–12.5)
Platelets: 349 10*3/uL (ref 140–400)
RBC: 5.04 10*6/uL (ref 3.80–5.10)
RDW: 13.9 % (ref 11.0–15.0)
WBC: 6.4 10*3/uL (ref 3.8–10.8)

## 2017-10-11 LAB — HEMOGLOBIN A1C
Hgb A1c MFr Bld: 5.8 % of total Hgb — ABNORMAL HIGH (ref <5.7)
MEAN PLASMA GLUCOSE: 120 (calc)
eAG (mmol/L): 6.6 (calc)

## 2017-10-16 ENCOUNTER — Ambulatory Visit (INDEPENDENT_AMBULATORY_CARE_PROVIDER_SITE_OTHER): Payer: Medicare Other | Admitting: Family Medicine

## 2017-10-16 ENCOUNTER — Encounter: Payer: Self-pay | Admitting: Family Medicine

## 2017-10-16 ENCOUNTER — Other Ambulatory Visit: Payer: Self-pay

## 2017-10-16 VITALS — BP 130/80 | HR 67 | Resp 12 | Ht 65.0 in | Wt 182.0 lb

## 2017-10-16 DIAGNOSIS — Z6830 Body mass index (BMI) 30.0-30.9, adult: Secondary | ICD-10-CM

## 2017-10-16 DIAGNOSIS — E559 Vitamin D deficiency, unspecified: Secondary | ICD-10-CM | POA: Diagnosis not present

## 2017-10-16 DIAGNOSIS — Z1211 Encounter for screening for malignant neoplasm of colon: Secondary | ICD-10-CM

## 2017-10-16 DIAGNOSIS — E6609 Other obesity due to excess calories: Secondary | ICD-10-CM

## 2017-10-16 DIAGNOSIS — E785 Hyperlipidemia, unspecified: Secondary | ICD-10-CM | POA: Diagnosis not present

## 2017-10-16 DIAGNOSIS — E8881 Metabolic syndrome: Secondary | ICD-10-CM | POA: Diagnosis not present

## 2017-10-16 DIAGNOSIS — R7303 Prediabetes: Secondary | ICD-10-CM

## 2017-10-16 DIAGNOSIS — I10 Essential (primary) hypertension: Secondary | ICD-10-CM | POA: Diagnosis not present

## 2017-10-16 DIAGNOSIS — Z723 Lack of physical exercise: Secondary | ICD-10-CM | POA: Diagnosis not present

## 2017-10-16 NOTE — Assessment & Plan Note (Signed)
The increased risk of cardiovascular disease associated with this diagnosis, and the need to consistently work on lifestyle to change this is discussed. Following  a  heart healthy diet ,commitment to 30 minutes of exercise at least 5 days per week, as well as control of blood sugar and cholesterol , and achieving a healthy weight are all the areas to be addressed .  

## 2017-10-16 NOTE — Assessment & Plan Note (Signed)
Improved. Patient re-educated about  the importance of commitment to a  minimum of 150 minutes of exercise per week.  The importance of healthy food choices with portion control discussed. Encouraged to start a food diary, count calories and to consider  joining a support group. Sample diet sheets offered. Goals set by the patient for the next several months.   Weight /BMI 10/16/2017 06/04/2017 04/04/2017  WEIGHT 182 lb 185 lb 4 oz 185 lb  HEIGHT 5\' 5"  5\' 5"  5\' 5"   BMI 30.29 kg/m2 30.83 kg/m2 30.79 kg/m2

## 2017-10-16 NOTE — Assessment & Plan Note (Signed)
Pt still needs to increase physical activity and is aware

## 2017-10-16 NOTE — Progress Notes (Signed)
Christina Carroll     MRN: [id number]      DOB: 11-13-1945   HPI Christina Carroll is here for follow up and re-evaluation of chronic medical conditions, medication management and review of any available recent lab and radiology data.  Preventive health is updated, specifically  Cancer screening and Immunization.   Questions or concerns regarding consultations or procedures which the PT has had in the interim are  addressed. The PT denies any adverse reactions to current medications since the last visit.  C/o  " growth" on inner thigh wants to know what to do about it  No regular exercise   ROS Denies recent fever or chills. Denies sinus pressure, nasal congestion, ear pain or sore throat. Denies chest congestion, productive cough or wheezing. Denies chest pains, palpitations and leg swelling Denies abdominal pain, nausea, vomiting,diarrhea or constipation.   Denies dysuria, frequency, hesitancy or incontinence. Denies uncontrolled  joint pain, swelling and limitation in mobility. Denies headaches, seizures, numbness, or tingling. Denies depression, anxiety or insomnia.  PE  BP 130/80 (BP Location: Left Arm, Patient Position: Sitting, Cuff Size: Large)   Pulse 67   Resp 12   Ht 5\' 5"  (1.651 m)   Wt 182 lb (82.6 kg)   SpO2 98%   BMI 30.29 kg/m   Patient alert and oriented and in no cardiopulmonary distress.  HEENT: No facial asymmetry, EOMI,   oropharynx pink and moist.  Neck supple no JVD, no mass.  Chest: Clear to auscultation bilaterally.  CVS: S1, S2 no murmurs, no S3.Regular rate.  ABD: Soft non tender.   Ext: No edema  MS: Adequate ROM spine, shoulders, hips and knees.  Skin: Intact, no ulcerations or rash noted.Viral wart hyperpigmented opn inner thigh   Psych: Good eye contact, normal affect. Memory intact not anxious or depressed appearing.  CNS: CN 2-12 intact, power,  normal throughout.no focal deficits noted.   Assessment & Plan  Essential  hypertension Controlled, no change in medication DASH diet and commitment to daily physical activity for a minimum of 30 minutes discussed and encouraged, as a part of hypertension management. The importance of attaining a healthy weight is also discussed.  BP/Weight 10/16/2017 06/04/2017 04/04/2017 01/01/2017 09/14/2016 08/17/2016 11/15/9369  Systolic BP 696 789 381 017 510 258 527  Diastolic BP 80 82 76 69 74 77 82  Wt. (Lbs) 182 185.25 185 190 190 192 187  BMI 30.29 30.83 30.79 31.62 31.62 31.95 31.12       Hyperlipidemia Hyperlipidemia:Low fat diet discussed and encouraged.   Lipid Panel  Lab Results  Component Value Date   CHOL 210 (H) 10/10/2017   HDL 74 10/10/2017   LDLCALC 120 (H) 10/10/2017   TRIG 72 10/10/2017   CHOLHDL 2.8 10/10/2017     Needs to reduce fried and fatty foods, not at goal Updated lab needed at/ before next visit.   Obesity Improved. Patient re-educated about  the importance of commitment to a  minimum of 150 minutes of exercise per week.  The importance of healthy food choices with portion control discussed. Encouraged to start a food diary, count calories and to consider  joining a support group. Sample diet sheets offered. Goals set by the patient for the next several months.   Weight /BMI 10/16/2017 06/04/2017 04/04/2017  WEIGHT 182 lb 185 lb 4 oz 185 lb  HEIGHT 5\' 5"  5\' 5"  5\' 5"   BMI 30.29 kg/m2 30.83 kg/m2 30.79 kg/m2      Prediabetes Patient educated about  the importance of limiting  Carbohydrate intake , the need to commit to daily physical activity for a minimum of 30 minutes , and to commit weight loss. The fact that changes in all these areas will reduce or eliminate all together the development of diabetes is stressed.   Diabetic Labs Latest Ref Rng & Units 10/10/2017 03/29/2017 08/11/2016 02/23/2016 08/27/2015  HbA1c <5.7 % of total Hgb 5.8(H) - 5.8(H) 5.8(H) 6.0(H)  Chol <200 mg/dL 210(H) 198 180 179 199  HDL >50 mg/dL 74 70 72 62 75    Calc LDL mg/dL (calc) 120(H) 109(H) 96 100(H) 105  Triglycerides <150 mg/dL 72 99 61 83 95  Creatinine 0.50 - 0.99 mg/dL 0.91 1.09(H) 0.94 1.08(H) 0.91   BP/Weight 10/16/2017 06/04/2017 04/04/2017 01/01/2017 09/14/2016 08/17/2016 1/61/0960  Systolic BP 454 098 119 147 829 562 130  Diastolic BP 80 82 76 69 74 77 82  Wt. (Lbs) 182 185.25 185 190 190 192 187  BMI 30.29 30.83 30.79 31.62 31.62 31.95 31.12   No flowsheet data found.    Metabolic syndrome X The increased risk of cardiovascular disease associated with this diagnosis, and the need to consistently work on lifestyle to change this is discussed. Following  a  heart healthy diet ,commitment to 30 minutes of exercise at least 5 days per week, as well as control of blood sugar and cholesterol , and achieving a healthy weight are all the areas to be addressed .   Lack of exercise Pt still needs to increase physical activity and is aware  Colon cancer screening Cologuard test arranged, asymptomatic and no polyps

## 2017-10-16 NOTE — Assessment & Plan Note (Signed)
Cologuard test arranged, asymptomatic and no polyps

## 2017-10-16 NOTE — Patient Instructions (Addendum)
Wellness with nurse March 19 or after, call if you need me sooner  HAPPY belated Birthday   MD f/U in 5. 5 months,   Fasting lipid, cmp and eGFR and Vit D 1 week before mD f/u  No changes in medication   Cologuard from home please  Please call end of August re flu vaccine  Increase activity and reduce sweets and fried and fatty fioods  Thank you  for choosing [facility]. We consider it a privelige to serve you.  Delivering excellent health care in a caring and  compassionate way is our goal.  Partnering with you,  so that together we can achieve this goal is our strategy.

## 2017-10-16 NOTE — Assessment & Plan Note (Signed)
Patient educated about the importance of limiting  Carbohydrate intake , the need to commit to daily physical activity for a minimum of 30 minutes , and to commit weight loss. The fact that changes in all these areas will reduce or eliminate all together the development of diabetes is stressed.   Diabetic Labs Latest Ref Rng & Units 10/10/2017 03/29/2017 08/11/2016 02/23/2016 08/27/2015  HbA1c <5.7 % of total Hgb 5.8(H) - 5.8(H) 5.8(H) 6.0(H)  Chol <200 mg/dL 210(H) 198 180 179 199  HDL >50 mg/dL 74 70 72 62 75  Calc LDL mg/dL (calc) 120(H) 109(H) 96 100(H) 105  Triglycerides <150 mg/dL 72 99 61 83 95  Creatinine 0.50 - 0.99 mg/dL 0.91 1.09(H) 0.94 1.08(H) 0.91   BP/Weight 10/16/2017 06/04/2017 04/04/2017 01/01/2017 09/14/2016 08/17/2016 03/31/1622  Systolic BP 469 507 225 750 518 335 825  Diastolic BP 80 82 76 69 74 77 82  Wt. (Lbs) 182 185.25 185 190 190 192 187  BMI 30.29 30.83 30.79 31.62 31.62 31.95 31.12   No flowsheet data found.

## 2017-10-16 NOTE — Assessment & Plan Note (Signed)
Hyperlipidemia:Low fat diet discussed and encouraged.   Lipid Panel  Lab Results  Component Value Date   CHOL 210 (H) 10/10/2017   HDL 74 10/10/2017   LDLCALC 120 (H) 10/10/2017   TRIG 72 10/10/2017   CHOLHDL 2.8 10/10/2017     Needs to reduce fried and fatty foods, not at goal Updated lab needed at/ before next visit.

## 2017-10-16 NOTE — Assessment & Plan Note (Signed)
Controlled, no change in medication DASH diet and commitment to daily physical activity for a minimum of 30 minutes discussed and encouraged, as a part of hypertension management. The importance of attaining a healthy weight is also discussed.  BP/Weight 10/16/2017 06/04/2017 04/04/2017 01/01/2017 09/14/2016 08/17/2016 9/98/0699  Systolic BP 967 227 737 505 107 125 247  Diastolic BP 80 82 76 69 74 77 82  Wt. (Lbs) 182 185.25 185 190 190 192 187  BMI 30.29 30.83 30.79 31.62 31.62 31.95 31.12

## 2017-10-20 IMAGING — CT CT MAXILLOFACIAL W/O CM
3 of 7 series · 15 of 47 positions shown, 18 images · non-contrast
Comparison: MR head 09/09/2012.

CLINICAL DATA: Patient fell and hit face and head. No reported loss
of consciousness.

EXAM:
CT HEAD WITHOUT CONTRAST
CT MAXILLOFACIAL WITHOUT CONTRAST
TECHNIQUE: Multidetector CT imaging of the head and maxillofacial structures
were performed using the standard protocol without intravenous
contrast. Multiplanar CT image reconstructions of the maxillofacial
structures were also generated.

[Series 3: head bone · axial · 0.43mm/px · z∈[+1626,+1734]mm · 10 of 67 slices shown, 13 images]
[im 7/67  brain]
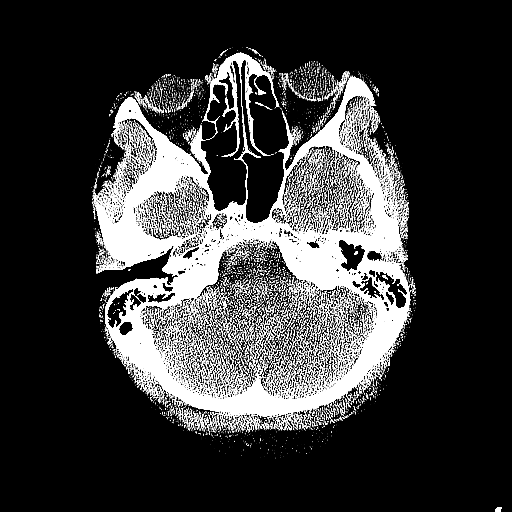
[im 7/67  bone]
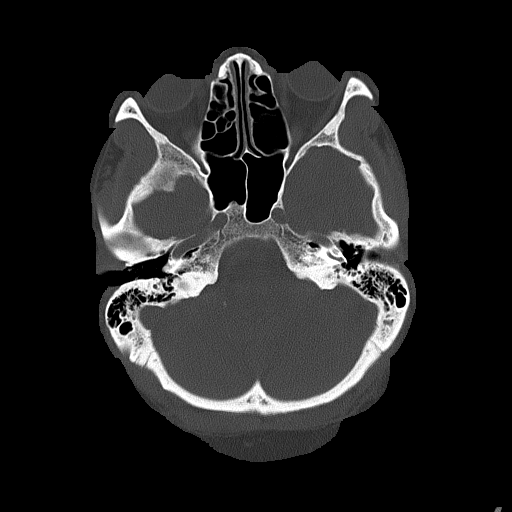
[im 13/67  bone]
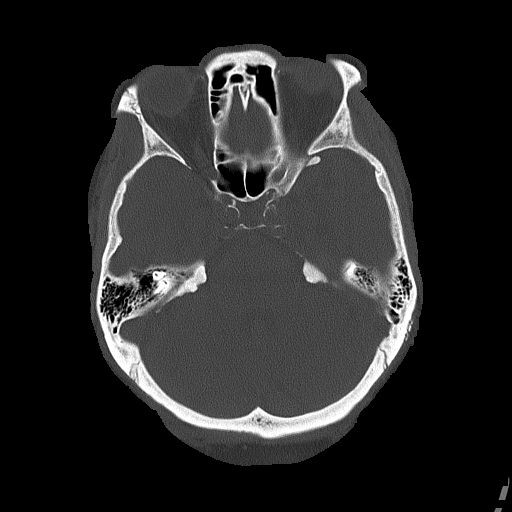
[im 19/67  bone]
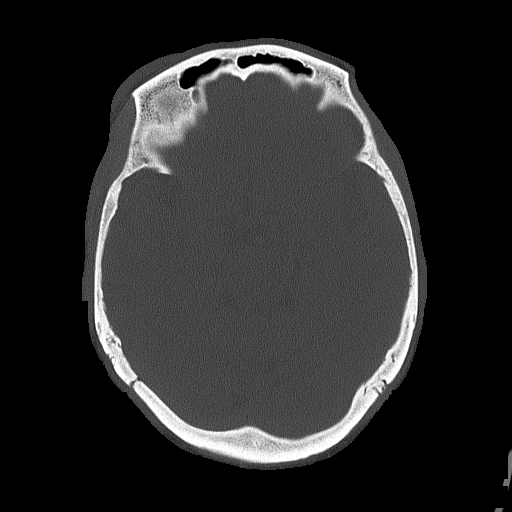
[im 25/67  bone]
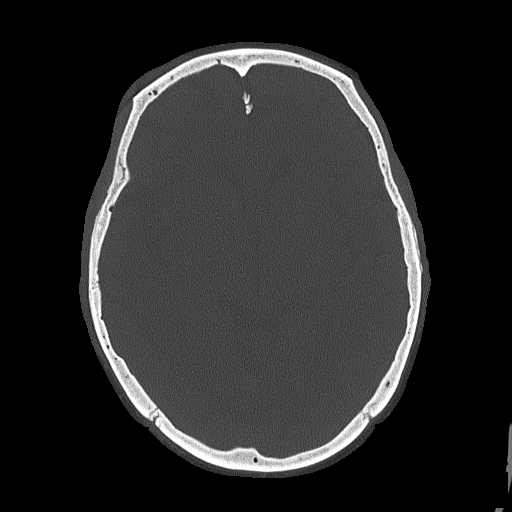
[im 31/67  brain]
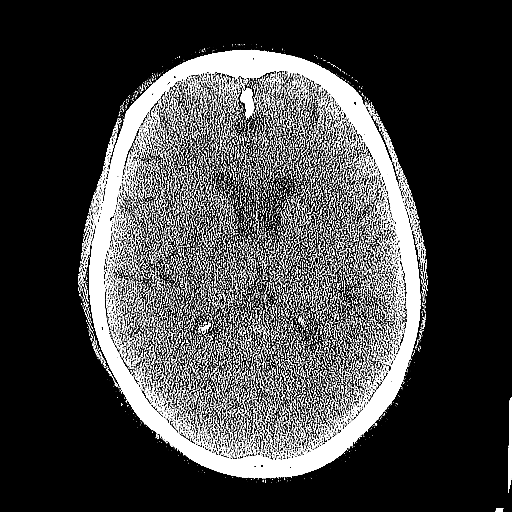
[im 31/67  bone]
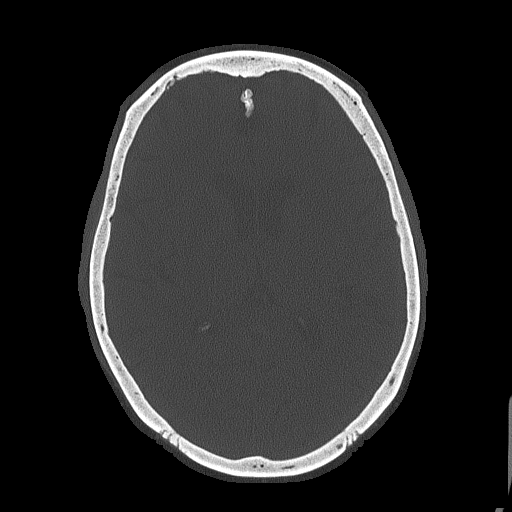
[im 37/67  bone]
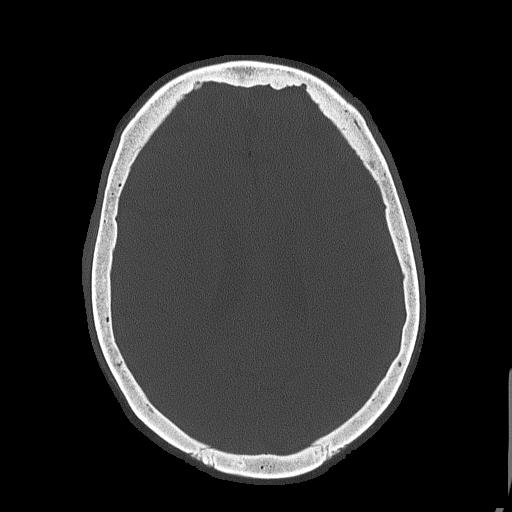
[im 43/67  bone]
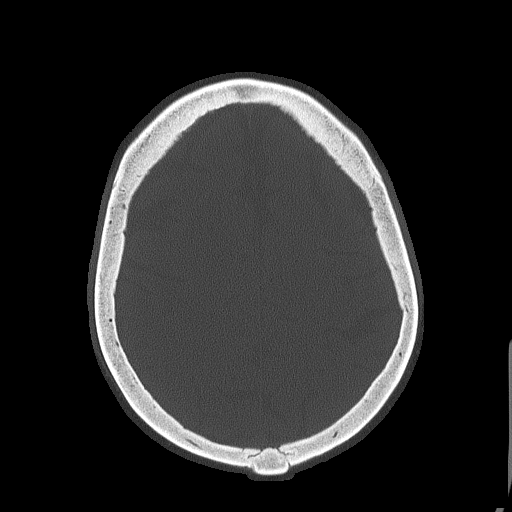
[im 49/67  bone]
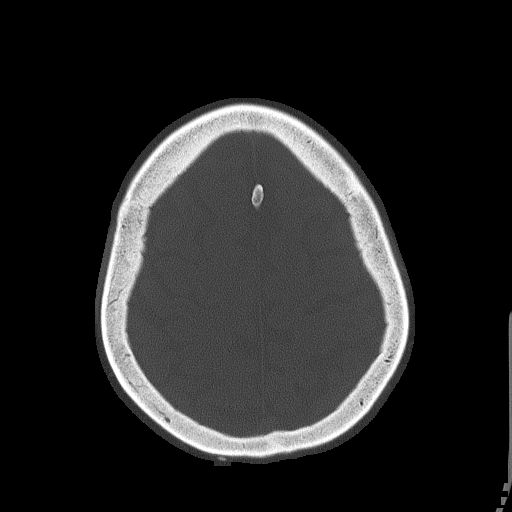
[im 55/67  brain]
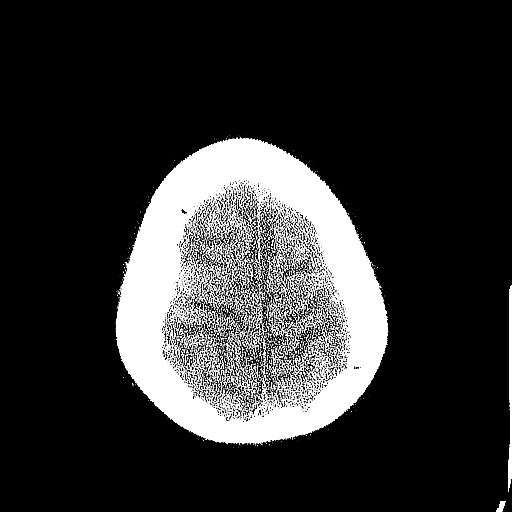
[im 55/67  bone]
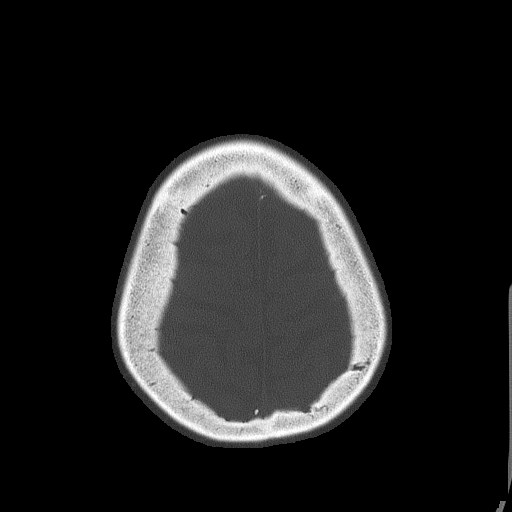
[im 61/67  bone]
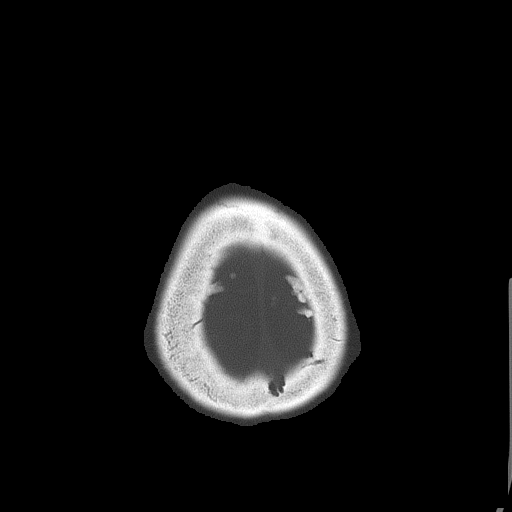

[Series 4: coronal soft tissue · coronal · 0.28mm/px · 3 of 67 slices shown]
[im 17/67  bone]
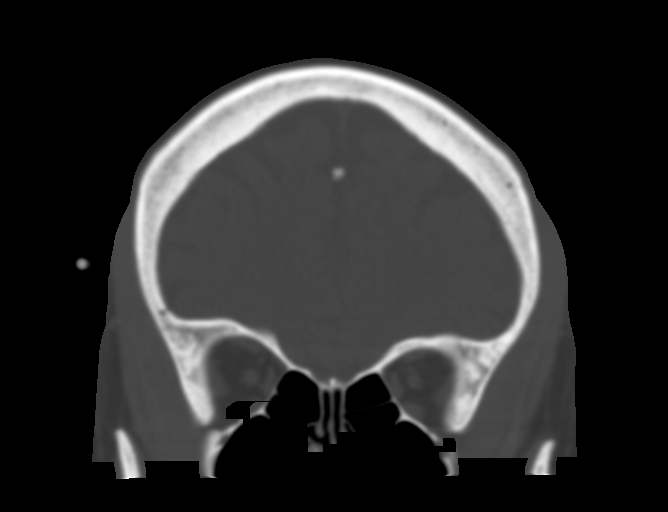
[im 34/67  bone]
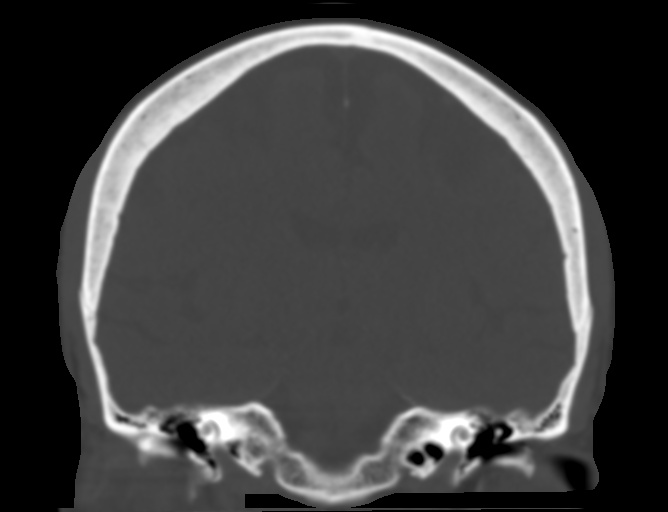
[im 50/67  bone]
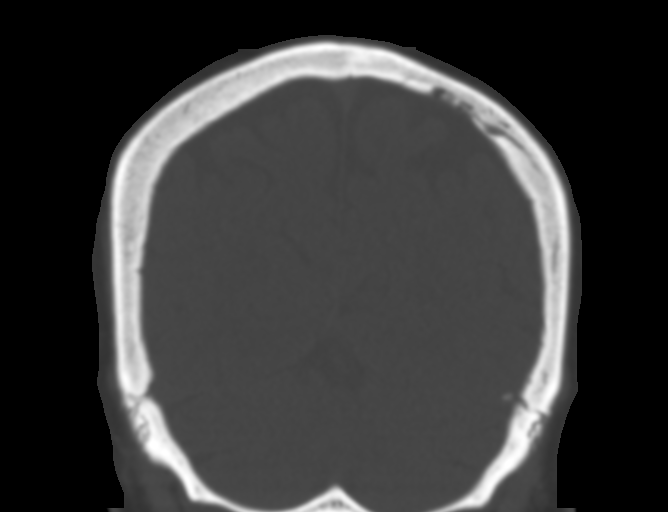

[Series 11: sagittal soft · sagittal · 0.33mm/px · 2 of 79 slices shown]
[im 27/79  bone]
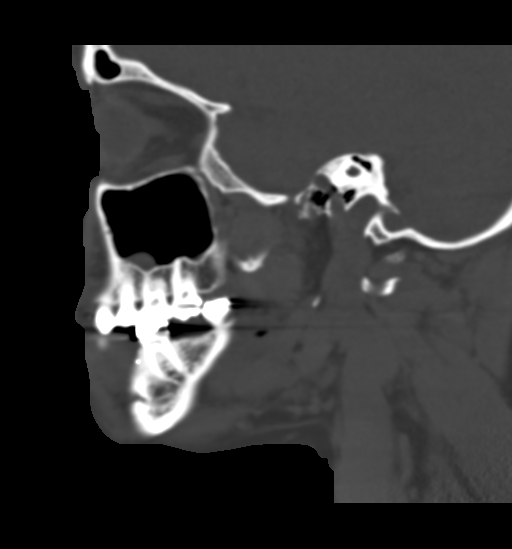
[im 53/79  bone]
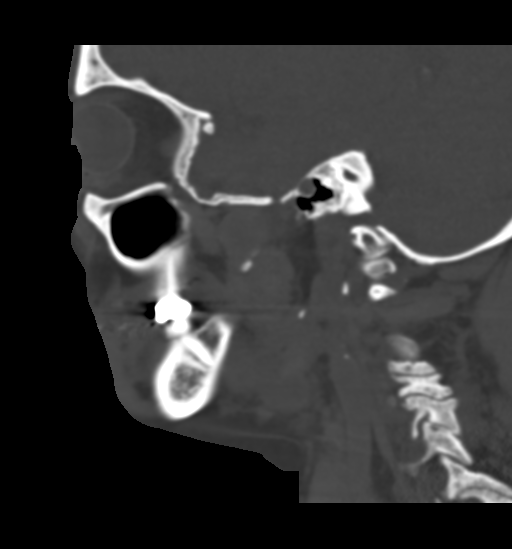

[15 of 47 positions shown; findings below may reference images not displayed]

FINDINGS: CT HEAD FINDINGS

Brain: No evidence for acute infarction, hemorrhage, mass lesion,
hydrocephalus, or extra-axial fluid. Mild atrophy. Hypoattenuation
of white matter representing small vessel disease.

Vascular: Calcification of the cavernous internal carotid arteries
consistent with cerebrovascular atherosclerotic disease. No signs of
intracranial large vessel occlusion.

Skull: Normal. Negative for fracture or focal lesion.

Other: Large RIGHT frontal scalp hematoma.

CT MAXILLOFACIAL FINDINGS

Osseous: No fracture or mandibular dislocation. No destructive
process.

Orbits: Negative. No postseptal traumatic or inflammatory finding.
Mild RIGHT preseptal soft tissue swelling. No foreign bodies.

Sinuses: Clear.No blowout injury.

Soft tissues: RIGHT-sided malar soft tissue swelling.
IMPRESSION: No skull fracture or intracranial hemorrhage. Mild atrophy and small
vessel disease.

No facial fracture or blowout injury.

RIGHT-sided facial swelling and RIGHT scalp hematoma.

## 2017-10-24 DIAGNOSIS — Z1211 Encounter for screening for malignant neoplasm of colon: Secondary | ICD-10-CM | POA: Diagnosis not present

## 2017-11-02 LAB — COLOGUARD: COLOGUARD: NEGATIVE

## 2017-11-07 ENCOUNTER — Other Ambulatory Visit: Payer: Self-pay | Admitting: Family Medicine

## 2017-11-08 ENCOUNTER — Ambulatory Visit (HOSPITAL_COMMUNITY)
Admission: RE | Admit: 2017-11-08 | Discharge: 2017-11-08 | Disposition: A | Discharge status: Home / Self Care | Payer: Medicare Other | Source: Ambulatory Visit | Attending: Family Medicine | Admitting: Family Medicine

## 2017-11-08 DIAGNOSIS — Z1231 Encounter for screening mammogram for malignant neoplasm of breast: Secondary | ICD-10-CM | POA: Diagnosis not present

## 2017-12-07 ENCOUNTER — Encounter: Payer: Self-pay | Admitting: Family Medicine

## 2017-12-21 ENCOUNTER — Ambulatory Visit (INDEPENDENT_AMBULATORY_CARE_PROVIDER_SITE_OTHER): Payer: Medicare Other

## 2017-12-21 DIAGNOSIS — Z23 Encounter for immunization: Secondary | ICD-10-CM | POA: Diagnosis not present

## 2018-01-02 DIAGNOSIS — E785 Hyperlipidemia, unspecified: Secondary | ICD-10-CM | POA: Diagnosis not present

## 2018-01-02 DIAGNOSIS — E049 Nontoxic goiter, unspecified: Secondary | ICD-10-CM | POA: Diagnosis not present

## 2018-01-02 DIAGNOSIS — E063 Autoimmune thyroiditis: Secondary | ICD-10-CM | POA: Diagnosis not present

## 2018-01-02 DIAGNOSIS — I10 Essential (primary) hypertension: Secondary | ICD-10-CM | POA: Diagnosis not present

## 2018-01-09 DIAGNOSIS — E049 Nontoxic goiter, unspecified: Secondary | ICD-10-CM | POA: Diagnosis not present

## 2018-01-09 DIAGNOSIS — E039 Hypothyroidism, unspecified: Secondary | ICD-10-CM | POA: Diagnosis not present

## 2018-01-09 DIAGNOSIS — E063 Autoimmune thyroiditis: Secondary | ICD-10-CM | POA: Diagnosis not present

## 2018-01-09 DIAGNOSIS — I10 Essential (primary) hypertension: Secondary | ICD-10-CM | POA: Diagnosis not present

## 2018-01-09 DIAGNOSIS — Z6832 Body mass index (BMI) 32.0-32.9, adult: Secondary | ICD-10-CM | POA: Diagnosis not present

## 2018-01-09 DIAGNOSIS — E785 Hyperlipidemia, unspecified: Secondary | ICD-10-CM | POA: Diagnosis not present

## 2018-01-09 DIAGNOSIS — E669 Obesity, unspecified: Secondary | ICD-10-CM | POA: Diagnosis not present

## 2018-02-18 DIAGNOSIS — M9905 Segmental and somatic dysfunction of pelvic region: Secondary | ICD-10-CM | POA: Diagnosis not present

## 2018-02-18 DIAGNOSIS — M5137 Other intervertebral disc degeneration, lumbosacral region: Secondary | ICD-10-CM | POA: Diagnosis not present

## 2018-02-20 DIAGNOSIS — M5137 Other intervertebral disc degeneration, lumbosacral region: Secondary | ICD-10-CM | POA: Diagnosis not present

## 2018-02-20 DIAGNOSIS — M9905 Segmental and somatic dysfunction of pelvic region: Secondary | ICD-10-CM | POA: Diagnosis not present

## 2018-02-21 DIAGNOSIS — M5137 Other intervertebral disc degeneration, lumbosacral region: Secondary | ICD-10-CM | POA: Diagnosis not present

## 2018-02-21 DIAGNOSIS — M9905 Segmental and somatic dysfunction of pelvic region: Secondary | ICD-10-CM | POA: Diagnosis not present

## 2018-02-25 DIAGNOSIS — M9905 Segmental and somatic dysfunction of pelvic region: Secondary | ICD-10-CM | POA: Diagnosis not present

## 2018-02-25 DIAGNOSIS — M5137 Other intervertebral disc degeneration, lumbosacral region: Secondary | ICD-10-CM | POA: Diagnosis not present

## 2018-02-28 DIAGNOSIS — M9905 Segmental and somatic dysfunction of pelvic region: Secondary | ICD-10-CM | POA: Diagnosis not present

## 2018-02-28 DIAGNOSIS — M5137 Other intervertebral disc degeneration, lumbosacral region: Secondary | ICD-10-CM | POA: Diagnosis not present

## 2018-03-04 DIAGNOSIS — M5137 Other intervertebral disc degeneration, lumbosacral region: Secondary | ICD-10-CM | POA: Diagnosis not present

## 2018-03-04 DIAGNOSIS — M9905 Segmental and somatic dysfunction of pelvic region: Secondary | ICD-10-CM | POA: Diagnosis not present

## 2018-03-06 DIAGNOSIS — M5137 Other intervertebral disc degeneration, lumbosacral region: Secondary | ICD-10-CM | POA: Diagnosis not present

## 2018-03-06 DIAGNOSIS — M9905 Segmental and somatic dysfunction of pelvic region: Secondary | ICD-10-CM | POA: Diagnosis not present

## 2018-03-07 DIAGNOSIS — M5137 Other intervertebral disc degeneration, lumbosacral region: Secondary | ICD-10-CM | POA: Diagnosis not present

## 2018-03-07 DIAGNOSIS — M9905 Segmental and somatic dysfunction of pelvic region: Secondary | ICD-10-CM | POA: Diagnosis not present

## 2018-03-11 DIAGNOSIS — M9905 Segmental and somatic dysfunction of pelvic region: Secondary | ICD-10-CM | POA: Diagnosis not present

## 2018-03-11 DIAGNOSIS — M5137 Other intervertebral disc degeneration, lumbosacral region: Secondary | ICD-10-CM | POA: Diagnosis not present

## 2018-03-18 DIAGNOSIS — M5137 Other intervertebral disc degeneration, lumbosacral region: Secondary | ICD-10-CM | POA: Diagnosis not present

## 2018-03-18 DIAGNOSIS — M9905 Segmental and somatic dysfunction of pelvic region: Secondary | ICD-10-CM | POA: Diagnosis not present

## 2018-03-19 ENCOUNTER — Ambulatory Visit (INDEPENDENT_AMBULATORY_CARE_PROVIDER_SITE_OTHER): Payer: Medicare Other | Admitting: Family Medicine

## 2018-03-19 ENCOUNTER — Encounter: Payer: Self-pay | Admitting: Family Medicine

## 2018-03-19 VITALS — BP 124/82 | Temp 98.9°F | Resp 15 | Ht 65.0 in | Wt 179.0 lb

## 2018-03-19 DIAGNOSIS — E663 Overweight: Secondary | ICD-10-CM

## 2018-03-19 DIAGNOSIS — H9193 Unspecified hearing loss, bilateral: Secondary | ICD-10-CM

## 2018-03-19 DIAGNOSIS — I10 Essential (primary) hypertension: Secondary | ICD-10-CM | POA: Diagnosis not present

## 2018-03-19 DIAGNOSIS — H6123 Impacted cerumen, bilateral: Secondary | ICD-10-CM | POA: Diagnosis not present

## 2018-03-19 DIAGNOSIS — J309 Allergic rhinitis, unspecified: Secondary | ICD-10-CM | POA: Insufficient documentation

## 2018-03-19 DIAGNOSIS — H9190 Unspecified hearing loss, unspecified ear: Secondary | ICD-10-CM | POA: Insufficient documentation

## 2018-03-19 DIAGNOSIS — J208 Acute bronchitis due to other specified organisms: Secondary | ICD-10-CM

## 2018-03-19 MED ORDER — PROMETHAZINE-DM 6.25-15 MG/5ML PO SYRP: ORAL_SOLUTION | ORAL | 0 refills | Status: DC

## 2018-03-19 MED ORDER — AZITHROMYCIN 250 MG PO TABS: ORAL_TABLET | ORAL | 0 refills | Status: DC

## 2018-03-19 MED ORDER — BENZONATATE 100 MG PO CAPS: 100.0000 mg | ORAL_CAPSULE | Freq: Two times a day (BID) | ORAL | 0 refills | Status: DC | PRN

## 2018-03-19 MED ORDER — CHLORPHENIRAMINE MALEATE 4 MG PO TABS: ORAL_TABLET | ORAL | 0 refills | Status: DC

## 2018-03-19 MED ORDER — LORATADINE 10 MG PO TABS: 10.0000 mg | ORAL_TABLET | Freq: Every day | ORAL | 3 refills | Status: DC

## 2018-03-19 MED ORDER — PREDNISONE 5 MG (21) PO TBPK: 5.0000 mg | ORAL_TABLET | ORAL | 0 refills | Status: DC

## 2018-03-19 NOTE — Progress Notes (Signed)
   Christina Carroll     MRN: [id number]      DOB: [birth year]   HPI Christina Carroll is here  5 day h/o worsening head and chest congestion, associated with fever and chills intermittently. Nasal drainage has thickened , and is yellowish green, and at times bloody. Sputum is thick and yellow. C/o bilateral ear pressure, c/o chronic  hearing loss and sore throat. Increasing fatigue , poor appetitie and sleep disturbed by cough. No improvement with OTC medication. Recently advised when she went to be tested for hearing aids, that she has cerumen impaction and she wants this removed  ROS  Denies chest pains, palpitations and leg swelling Denies abdominal pain, nausea, vomiting,diarrhea or constipation.   Denies dysuria, frequency, hesitancy or incontinence. Denies joint pain, swelling and limitation in mobility. Denies headaches, seizures, numbness, or tingling. Denies depression, anxiety or insomnia. Denies skin break down or rash.   PE  BP 124/82   Temp 98.9 F (37.2 C) (Oral)   Resp 15   Ht 5\' 5"  (1.651 m)   Wt 179 lb (81.2 kg)   SpO2 98%   BMI 29.79 kg/m   Patient alert and oriented and in no cardiopulmonary distress.  HEENT: No facial asymmetry, EOMI,   oropharynx pink and moist.  Neck supple no JVD, no mass.Bilateral cerumen impaction , maxillary sinus tenderness  Chest: bilateral crackles and few wheezes, adequate air entry  CVS: S1, S2 no murmurs, no S3.Regular rate.  ABD: Soft non tender.   Ext: No edema  MS: Adequate ROM spine, shoulders, hips and knees.  Skin: Intact, no ulcerations or rash noted.  Psych: Good eye contact, normal affect. Memory intact not anxious or depressed appearing.  CNS: CN 2-12 intact, power,  normal throughout.no focal deficits noted.   Assessment & Plan  Allergic sinusitis Uncontrolled pred x 5  Mg dose pack, loratidine daily,  , phenergan dm and chlorpheniramine as needed  Acute bronchitis tessalon perle and cough  suppressant , Z pack prescribed, call if not improved in 1 week  Bilateral impacted cerumen Associated with fullness and decreased hearing, cnal is s,mall and needs ENT to disimpact, referred  Hearing loss Chronic but assesed as worsening , refer to ENT for eval  Overweight (BMI 25.0-29.9) Improved, she is applauded on this and encouraged to continue in this direction Patient re-educated about  the importance of commitment to a  minimum of 150 minutes of exercise per week.  The importance of healthy food choices with portion control discussed. Encouraged to start a food diary, count calories and to consider  joining a support group. Sample diet sheets offered. Goals set by the patient for the next several months.   Weight /BMI 03/19/2018 10/16/2017 06/04/2017  WEIGHT 179 lb 182 lb 185 lb 4 oz  HEIGHT 5\' 5"  5\' 5"  5\' 5"   BMI 29.79 kg/m2 30.29 kg/m2 30.83 kg/m2      Essential hypertension Controlled, no change in medication DASH diet and commitment to daily physical activity for a minimum of 30 minutes discussed and encouraged, as a part of hypertension management. The importance of attaining a healthy weight is also discussed.  BP/Weight 03/19/2018 10/16/2017 06/04/2017 04/04/2017 01/01/2017 09/14/2016 0/11/3816  Systolic BP 299 371 696 789 381 017 510  Diastolic BP 82 80 82 76 69 74 77  Wt. (Lbs) 179 182 185.25 185 190 190 192  BMI 29.79 30.29 30.83 30.79 31.62 31.62 31.95

## 2018-03-19 NOTE — Assessment & Plan Note (Addendum)
tessalon perle and cough suppressant , Z pack prescribed, call if not improved in 1 week

## 2018-03-19 NOTE — Patient Instructions (Addendum)
Reschedule Jan appt , return to MD end August, call if you need me sooner.Keep wellness   Please get fasting labs in next 2 weeks, and we will send report to you  Congrats, blood pressure and weight are good  It is important that you exercise regularly at least 30 minutes 5 times a week. If you develop chest pain, have severe difficulty breathing, or feel very tired, stop exercising immediately and seek medical attention    Medications are sent to your pharmacy, azithromycin, tessalon perles, phenergan dM, loratidine and chlorpheniramine, and prednisone dose pack  You are referred to ENT re hearing loss and bilateral impaction  Best for 2020

## 2018-03-19 NOTE — Assessment & Plan Note (Addendum)
Uncontrolled pred x 5  Mg dose pack, loratidine daily,  , phenergan dm and chlorpheniramine as needed

## 2018-03-24 ENCOUNTER — Encounter: Payer: Self-pay | Admitting: Family Medicine

## 2018-03-24 NOTE — Assessment & Plan Note (Signed)
Controlled, no change in medication DASH diet and commitment to daily physical activity for a minimum of 30 minutes discussed and encouraged, as a part of hypertension management. The importance of attaining a healthy weight is also discussed.  BP/Weight 03/19/2018 10/16/2017 06/04/2017 04/04/2017 01/01/2017 09/14/2016 7/42/5956  Systolic BP 387 564 332 951 884 166 063  Diastolic BP 82 80 82 76 69 74 77  Wt. (Lbs) 179 182 185.25 185 190 190 192  BMI 29.79 30.29 30.83 30.79 31.62 31.62 31.95

## 2018-03-24 NOTE — Assessment & Plan Note (Addendum)
Improved, she is applauded on this and encouraged to continue in this direction Patient re-educated about  the importance of commitment to a  minimum of 150 minutes of exercise per week.  The importance of healthy food choices with portion control discussed. Encouraged to start a food diary, count calories and to consider  joining a support group. Sample diet sheets offered. Goals set by the patient for the next several months.   Weight /BMI 03/19/2018 10/16/2017 06/04/2017  WEIGHT 179 lb 182 lb 185 lb 4 oz  HEIGHT 5\' 5"  5\' 5"  5\' 5"   BMI 29.79 kg/m2 30.29 kg/m2 30.83 kg/m2

## 2018-03-24 NOTE — Assessment & Plan Note (Signed)
Associated with fullness and decreased hearing, cnal is s,mall and needs ENT to disimpact, referred

## 2018-03-24 NOTE — Assessment & Plan Note (Signed)
Chronic but assesed as worsening , refer to ENT for eval

## 2018-03-25 DIAGNOSIS — M5137 Other intervertebral disc degeneration, lumbosacral region: Secondary | ICD-10-CM | POA: Diagnosis not present

## 2018-03-25 DIAGNOSIS — M9905 Segmental and somatic dysfunction of pelvic region: Secondary | ICD-10-CM | POA: Diagnosis not present

## 2018-03-27 ENCOUNTER — Telehealth: Payer: Self-pay

## 2018-03-27 DIAGNOSIS — R7303 Prediabetes: Secondary | ICD-10-CM

## 2018-03-27 DIAGNOSIS — E785 Hyperlipidemia, unspecified: Secondary | ICD-10-CM | POA: Diagnosis not present

## 2018-03-27 DIAGNOSIS — M5137 Other intervertebral disc degeneration, lumbosacral region: Secondary | ICD-10-CM | POA: Diagnosis not present

## 2018-03-27 DIAGNOSIS — E559 Vitamin D deficiency, unspecified: Secondary | ICD-10-CM | POA: Diagnosis not present

## 2018-03-27 DIAGNOSIS — M19011 Primary osteoarthritis, right shoulder: Secondary | ICD-10-CM | POA: Diagnosis not present

## 2018-03-27 DIAGNOSIS — M9905 Segmental and somatic dysfunction of pelvic region: Secondary | ICD-10-CM | POA: Diagnosis not present

## 2018-03-27 NOTE — Telephone Encounter (Signed)
Lab ordered.

## 2018-03-28 ENCOUNTER — Encounter: Payer: Self-pay | Admitting: Family Medicine

## 2018-03-28 LAB — COMPLETE METABOLIC PANEL WITH GFR
AG Ratio: 1.4 (calc) (ref 1.0–2.5)
ALT: 21 U/L (ref 6–29)
AST: 18 U/L (ref 10–35)
Albumin: 3.9 g/dL (ref 3.6–5.1)
Alkaline phosphatase (APISO): 76 U/L (ref 33–130)
BUN: 20 mg/dL (ref 7–25)
CO2: 32 mmol/L (ref 20–32)
Calcium: 10 mg/dL (ref 8.6–10.4)
Chloride: 97 mmol/L — ABNORMAL LOW (ref 98–110)
Creat: 0.91 mg/dL (ref 0.60–0.93)
GFR, Est African American: 74 mL/min/{1.73_m2} (ref >=60)
GFR, Est Non African American: 64 mL/min/{1.73_m2} (ref >=60)
Globulin: 2.8 g/dL (calc) (ref 1.9–3.7)
Glucose, Bld: 75 mg/dL (ref 65–99)
Potassium: 4.9 mmol/L (ref 3.5–5.3)
Sodium: 137 mmol/L (ref 135–146)
TOTAL PROTEIN: 6.7 g/dL (ref 6.1–8.1)
Total Bilirubin: 0.9 mg/dL (ref 0.2–1.2)

## 2018-03-28 LAB — LIPID PANEL
CHOL/HDL RATIO: 3.1 (calc) (ref <5.0)
Cholesterol: 197 mg/dL (ref <200)
HDL: 63 mg/dL (ref >50)
LDL CHOLESTEROL (CALC): 115 mg/dL — AB
Non-HDL Cholesterol (Calc): 134 mg/dL (calc) — ABNORMAL HIGH (ref <130)
Triglycerides: 88 mg/dL (ref <150)

## 2018-03-28 LAB — VITAMIN D 25 HYDROXY (VIT D DEFICIENCY, FRACTURES): Vit D, 25-Hydroxy: 42 ng/mL (ref 30–100)

## 2018-03-28 LAB — HEMOGLOBIN A1C
Hgb A1c MFr Bld: 5.9 % of total Hgb — ABNORMAL HIGH (ref <5.7)
Mean Plasma Glucose: 123 (calc)
eAG (mmol/L): 6.8 (calc)

## 2018-04-04 ENCOUNTER — Ambulatory Visit: Payer: Medicare Other | Admitting: Family Medicine

## 2018-04-15 ENCOUNTER — Ambulatory Visit (INDEPENDENT_AMBULATORY_CARE_PROVIDER_SITE_OTHER): Payer: Medicare Other | Admitting: Otolaryngology

## 2018-04-15 DIAGNOSIS — H903 Sensorineural hearing loss, bilateral: Secondary | ICD-10-CM | POA: Diagnosis not present

## 2018-04-15 DIAGNOSIS — H6123 Impacted cerumen, bilateral: Secondary | ICD-10-CM | POA: Diagnosis not present

## 2018-06-05 ENCOUNTER — Encounter: Payer: Self-pay | Admitting: Family Medicine

## 2018-06-05 ENCOUNTER — Ambulatory Visit (INDEPENDENT_AMBULATORY_CARE_PROVIDER_SITE_OTHER): Payer: Medicare Other | Admitting: Family Medicine

## 2018-06-05 ENCOUNTER — Other Ambulatory Visit: Payer: Self-pay

## 2018-06-05 VITALS — BP 124/80 | HR 88 | Temp 99.0°F | Ht 65.0 in | Wt 179.0 lb

## 2018-06-05 DIAGNOSIS — Z780 Asymptomatic menopausal state: Secondary | ICD-10-CM

## 2018-06-05 DIAGNOSIS — Z0000 Encounter for general adult medical examination without abnormal findings: Secondary | ICD-10-CM | POA: Diagnosis not present

## 2018-06-05 NOTE — Progress Notes (Signed)
Subjective:   Christina Carroll is a 73 y.o. female who presents for Medicare Annual (Subsequent) preventive examination.  Review of Systems:   Review of Systems  Constitutional: Negative for chills and fever.  HENT: Negative for congestion, sinus pain and sore throat.   Eyes: Negative.   Respiratory: Negative for cough, shortness of breath and wheezing.   Cardiovascular: Negative for chest pain, palpitations and leg swelling.  Gastrointestinal: Negative.   Genitourinary: Negative.   Musculoskeletal: Negative.   Neurological: Negative.  Negative for dizziness, weakness and headaches.  Endo/Heme/Allergies: Negative.   Psychiatric/Behavioral: Negative.  Negative for memory loss. The patient is not nervous/anxious and does not have insomnia.   All other systems reviewed and are negative.          Objective:     Vitals: There were no vitals taken for this visit.  There is no height or weight on file to calculate BMI.    Physical Exam Vitals signs and nursing note reviewed.  Constitutional:      Appearance: Normal appearance. She is obese.  HENT:     Head: Normocephalic.     Right Ear: External ear normal.     Left Ear: External ear normal.     Nose: Nose normal.     Mouth/Throat:     Mouth: Mucous membranes are moist.  Eyes:     General:        Right eye: No discharge.        Left eye: No discharge.     Conjunctiva/sclera: Conjunctivae normal.  Cardiovascular:     Rate and Rhythm: Normal rate and regular rhythm.  Pulmonary:     Effort: Pulmonary effort is normal.     Breath sounds: Normal breath sounds.  Musculoskeletal: Normal range of motion.  Skin:    General: Skin is warm and dry.     Capillary Refill: Capillary refill takes less than 2 seconds.  Neurological:     Mental Status: She is alert and oriented to person, place, and time.  Psychiatric:        Mood and Affect: Mood normal.        Behavior: Behavior normal.        Thought Content: Thought  content normal.        Judgment: Judgment normal.      Advanced Directives 06/04/2017 01/01/2017 04/25/2016 12/23/2015 08/10/2015 11/30/2011  Does Patient Have a Medical Advance Directive? Yes No Yes Yes No Patient has advance directive, copy not in chart  Type of Advance Directive Healthcare Power of [place]  Does patient want to make changes to medical advance directive? - - Yes (MAU/Ambulatory/Procedural Areas - Information given) No - Patient declined - -  Copy of [place] in Chart? Yes - No - copy requested No - copy requested - -  Pre-existing out of facility DNR order (yellow form or pink MOST form) - - - - - No    Tobacco Social History   Tobacco Use  Smoking Status Never Smoker  Smokeless Tobacco Never Used     Counseling given: Not Answered   Clinical Intake:                       Past Medical History:  Diagnosis Date  . Arthritis   . Essential hypertension   . Hyperlipidemia   . Lymphocytic thyroiditis   . Obesity   .  Prediabetes    Past Surgical History:  Procedure Laterality Date  . Bilateral foot surgery bone removed  2000  . COLONOSCOPY  11/30/2011   Procedure: COLONOSCOPY;  Surgeon: Rogene Houston, MD;  Location: [facility];  Service: Endoscopy;  Laterality: N/A;  830  . Lumpectomy removed from thoracic spine area  1999   Family History  Problem Relation Age of Onset  . Stroke Mother   . Diabetes Mother   . Hypertension Mother   . Coronary artery disease Father   . Arthritis Unknown   . Cancer Unknown   . Breast cancer Maternal Aunt    Social History   Socioeconomic History  . Marital status: Divorced    Spouse name: Not on file  . Number of children: 1  . Years of education: Not on file  . Highest education level: Not on file  Occupational History  . Occupation: Employed   Scientific laboratory technician  . Financial resource strain:  Not hard at all  . Food insecurity:    Worry: Never true    Inability: Never true  . Transportation needs:    Medical: No    Non-medical: No  Tobacco Use  . Smoking status: Never Smoker  . Smokeless tobacco: Never Used  Substance and Sexual Activity  . Alcohol use: No    Alcohol/week: 0.0 standard drinks  . Drug use: No  . Sexual activity: Not Currently  Lifestyle  . Physical activity:    Days per week: 0 days    Minutes per session: 0 min  . Stress: Only a little  Relationships  . Social connections:    Talks on phone: More than three times a week    Gets together: More than three times a week    Attends religious service: More than 4 times per year    Active member of club or organization: Yes    Attends meetings of clubs or organizations: More than 4 times per year    Relationship status: Divorced  Other Topics Concern  . Not on file  Social History Narrative  . Not on file    Outpatient Encounter Medications as of 06/05/2018  Medication Sig  . aspirin (ASPIRIN LOW DOSE) 81 MG EC tablet Take 81 mg by mouth daily.   Marland Kitchen azithromycin (ZITHROMAX) 250 MG tablet Take two tablets on day one then one tablet once daily for four days  . benzonatate (TESSALON) 100 MG capsule Take 1 capsule (100 mg total) by mouth 2 (two) times daily as needed for cough.  . calcium-vitamin D (OSCAL WITH D) 500-200 MG-UNIT TABS tablet TAKE (1) TABLET BY MOUTH (3) TIMES DAILY.  . chlorpheniramine (CHLOR-TRIMETON) 4 MG tablet Take one tablet once daily as needed , for excess drainage  . Cholecalciferol (VITAMIN D) 2000 units CAPS Take 1 capsule (2,000 Units total) by mouth daily.  Marland Kitchen loratadine (CLARITIN) 10 MG tablet Take 1 tablet (10 mg total) by mouth daily.  Marland Kitchen NIFEdipine (PROCARDIA XL/ADALAT-CC) 60 MG 24 hr tablet TAKE 1 TABLET BY MOUTH  DAILY  . pravastatin (PRAVACHOL) 80 MG tablet TAKE 1 TABLET BY MOUTH  DAILY  . predniSONE (STERAPRED UNI-PAK 21 TAB) 5 MG (21) TBPK tablet Take 1 tablet (5 mg  total) by mouth as directed. Use as directed  . promethazine-dextromethorphan (PROMETHAZINE-DM) 6.25-15 MG/5ML syrup One teaspoon at bedtime as needed, for excess cough for patient  . SYNTHROID 25 MCG tablet   . triamterene-hydrochlorothiazide (MAXZIDE-25) 37.5-25 MG tablet TAKE 1 TABLET BY MOUTH  DAILY   No facility-administered encounter medications on file as of 06/05/2018.     Activities of Daily Living No flowsheet data found.  Patient Care Team: Fayrene Helper, MD as PCP - General Morayati, Lourdes Sledge, MD as Attending Physician (Endocrinology) Marybelle Killings, MD as Consulting Physician (Orthopedic Surgery)    Assessment:   This is a routine wellness examination for Lane.  Exercise Activities and Dietary recommendations    Goals    . Exercise 3x per week (30 min per time)     Patient would like to start exercising 3 times a week for at least 30 minutes at a time.        Fall Risk Fall Risk  03/19/2018 10/16/2017 06/04/2017 04/25/2016 03/09/2015  Falls in the past year? 1 No Yes Yes No  Number falls in past yr: 0 - 2 or more 1 -  Injury with Fall? 0 - Yes No -  Follow up - - - Falls evaluation completed -   Is the patient's home free of loose throw rugs in walkways, pet beds, electrical cords, etc?   yes      Grab bars in the bathroom? no      Handrails on the stairs?   yes      Adequate lighting?   yes  Timed Get Up and Go performed: 7 seconds   Depression Screen PHQ 2/9 Scores 03/19/2018 10/16/2017 06/04/2017 04/25/2016  PHQ - 2 Score 0 0 0 0  PHQ- 9 Score - - - -     Cognitive Function     6CIT Screen 06/04/2017 04/25/2016  What Year? 0 points 0 points  What month? 0 points 0 points  What time? 0 points 0 points  Count back from 20 0 points 0 points  Months in reverse 0 points 0 points  Repeat phrase 0 points 0 points  Total Score 0 0    Immunization History  Administered Date(s) Administered  . Influenza Split 02/21/2011, 01/31/2012  . Influenza  Whole 01/23/2007, 12/21/2008, 02/02/2010  . Influenza, High Dose Seasonal PF 12/21/2017  . Influenza,inj,Quad PF,6+ Mos 12/24/2013, 01/18/2015, 12/01/2015, 01/18/2017  . Pneumococcal Conjugate-13 09/22/2013  . Pneumococcal Polysaccharide-23 04/03/2013  . Td 04/10/2005  . Tdap 06/29/2014  . Zoster 04/19/2009  . Zoster Recombinat (Shingrix) 06/05/2017, 09/06/2017    Qualifies for Shingles Vaccine? No-previously had   Screening Tests Health Maintenance  Topic Date Due  . MAMMOGRAM  11/09/2019  . COLONOSCOPY  11/29/2021  . TETANUS/TDAP  06/28/2024  . INFLUENZA VACCINE  Completed  . DEXA SCAN  Completed  . Hepatitis C Screening  Completed  . PNA vac Low Risk Adult  Completed    Cancer Screenings: Lung: Low Dose CT Chest recommended if Age 89-80 years, 30 pack-year currently smoking OR have quit w/in 15years. Patient does not qualify. Breast:  Up to date on Mammogram? Yes   Up to date of Bone Density/Dexa? Yes  Colorectal: 10/2017 -nega  Additional Screenings: Hepatitis C Screening: negative in 2012     Plan:    1. Encounter for Medicare annual wellness exam  I have personally reviewed and noted the following in the patient's chart:   . Medical and social history . Use of alcohol, tobacco or illicit drugs  . Current medications and supplements . Functional ability and status . Nutritional status . Physical activity . Advanced directives . List of other physicians . Hospitalizations, surgeries, and ER visits in previous 12 months . Vitals . Screenings to include cognitive,  depression, and falls . Referrals and appointments  In addition, I have reviewed and discussed with patient certain preventive protocols, quality metrics, and best practice recommendations. A written personalized care plan for preventive services as well as general preventive health recommendations were provided to patient.   2. Post-menopausal - DG Bone Density; Future      Cherly Beach, NP   06/05/2018

## 2018-06-05 NOTE — Patient Instructions (Addendum)
Thank you for coming into the office today. I appreciate the opportunity to provide you with the care for your health and wellness.   Christina Carroll , Thank you for taking time to come for your Medicare Wellness Visit. I appreciate your ongoing commitment to your health goals. Please review the following plan we discussed and let me know if I can assist you in the future.   Screening recommendations/referrals: Recommended yearly ophthalmology/optometry visit for glaucoma screening and checkup Recommended yearly dental visit for hygiene and checkup   Preventive Care 85 Years and Older, Female Preventive care refers to lifestyle choices and visits with your health care provider that can promote health and wellness. What does preventive care include?  A yearly physical exam. This is also called an annual well check.  Dental exams once or twice a year.  Routine eye exams. Ask your health care provider how often you should have your eyes checked.  Personal lifestyle choices, including:  Daily care of your teeth and gums.  Regular physical activity.  Eating a healthy diet.  Avoiding tobacco and drug use.  Limiting alcohol use.  Practicing safe sex.  Taking low-dose aspirin every day.  Taking vitamin and mineral supplements as recommended by your health care provider. What happens during an annual well check? The services and screenings done by your health care provider during your annual well check will depend on your age, overall health, lifestyle risk factors, and family history of disease. Counseling  Your health care provider may ask you questions about your:  Alcohol use.  Tobacco use.  Drug use.  Emotional well-being.  Home and relationship well-being.  Sexual activity.  Eating habits.  History of falls.  Memory and ability to understand (cognition).  Work and work Statistician.  Reproductive health. Screening  You may have the following tests or  measurements:  Height, weight, and BMI.  Blood pressure.  Lipid and cholesterol levels. These may be checked every 5 years, or more frequently if you are over 32 years old.  Skin check.  Lung cancer screening. You may have this screening every year starting at age 28 if you have a 30-pack-year history of smoking and currently smoke or have quit within the past 15 years.  Fecal occult blood test (FOBT) of the stool. You may have this test every year starting at age 63.  Flexible sigmoidoscopy or colonoscopy. You may have a sigmoidoscopy every 5 years or a colonoscopy every 10 years starting at age 63.  Hepatitis C blood test.  Hepatitis B blood test.  Sexually transmitted disease (STD) testing.  Diabetes screening. This is done by checking your blood sugar (glucose) after you have not eaten for a while (fasting). You may have this done every 1-3 years.  Bone density scan. This is done to screen for osteoporosis. You may have this done starting at age 55.  Mammogram. This may be done every 1-2 years. Talk to your health care provider about how often you should have regular mammograms. Talk with your health care provider about your test results, treatment options, and if necessary, the need for more tests. Vaccines  Your health care provider may recommend certain vaccines, such as:  Influenza vaccine. This is recommended every year.  Tetanus, diphtheria, and acellular pertussis (Tdap, Td) vaccine. You may need a Td booster every 10 years.  Zoster vaccine. You may need this after age 27.  Pneumococcal 13-valent conjugate (PCV13) vaccine. One dose is recommended after age 71.  Pneumococcal  polysaccharide (PPSV23) vaccine. One dose is recommended after age 50. Talk to your health care provider about which screenings and vaccines you need and how often you need them. This information is not intended to replace advice given to you by your health care provider. Make sure you discuss  any questions you have with your health care provider. Document Released: 04/02/2015 Document Revised: 11/24/2015 Document Reviewed: 01/05/2015 Elsevier Interactive Patient Education  2017 [place] Prevention in the Home Falls can cause injuries. They can happen to people of all ages. There are many things you can do to make your home safe and to help prevent falls. What can I do on the outside of my home?  Regularly fix the edges of walkways and driveways and fix any cracks.  Remove anything that might make you trip as you walk through a door, such as a raised step or threshold.  Trim any bushes or trees on the path to your home.  Use bright outdoor lighting.  Clear any walking paths of anything that might make someone trip, such as rocks or tools.  Regularly check to see if handrails are loose or broken. Make sure that both sides of any steps have handrails.  Any raised decks and porches should have guardrails on the edges.  Have any leaves, snow, or ice cleared regularly.  Use sand or salt on walking paths during winter.  Clean up any spills in your garage right away. This includes oil or grease spills. What can I do in the bathroom?  Use night lights.  Install grab bars by the toilet and in the tub and shower. Do not use towel bars as grab bars.  Use non-skid mats or decals in the tub or shower.  If you need to sit down in the shower, use a plastic, non-slip stool.  Keep the floor dry. Clean up any water that spills on the floor as soon as it happens.  Remove soap buildup in the tub or shower regularly.  Attach bath mats securely with double-sided non-slip rug tape.  Do not have throw rugs and other things on the floor that can make you trip. What can I do in the bedroom?  Use night lights.  Make sure that you have a light by your bed that is easy to reach.  Do not use any sheets or blankets that are too big for your bed. They should not hang down  onto the floor.  Have a firm chair that has side arms. You can use this for support while you get dressed.  Do not have throw rugs and other things on the floor that can make you trip. What can I do in the kitchen?  Clean up any spills right away.  Avoid walking on wet floors.  Keep items that you use a lot in easy-to-reach places.  If you need to reach something above you, use a strong step stool that has a grab bar.  Keep electrical cords out of the way.  Do not use floor polish or wax that makes floors slippery. If you must use wax, use non-skid floor wax.  Do not have throw rugs and other things on the floor that can make you trip. What can I do with my stairs?  Do not leave any items on the stairs.  Make sure that there are handrails on both sides of the stairs and use them. Fix handrails that are broken or loose. Make sure that handrails are as long  as the stairways.  Check any carpeting to make sure that it is firmly attached to the stairs. Fix any carpet that is loose or worn.  Avoid having throw rugs at the top or bottom of the stairs. If you do have throw rugs, attach them to the floor with carpet tape.  Make sure that you have a light switch at the top of the stairs and the bottom of the stairs. If you do not have them, ask someone to add them for you. What else can I do to help prevent falls?  Wear shoes that:  Do not have high heels.  Have rubber bottoms.  Are comfortable and fit you well.  Are closed at the toe. Do not wear sandals.  If you use a stepladder:  Make sure that it is fully opened. Do not climb a closed stepladder.  Make sure that both sides of the stepladder are locked into place.  Ask someone to hold it for you, if possible.  Clearly mark and make sure that you can see:  Any grab bars or handrails.  First and last steps.  Where the edge of each step is.  Use tools that help you move around (mobility aids) if they are needed. These  include:  Canes.  Walkers.  Scooters.  Crutches.  Turn on the lights when you go into a dark area. Replace any light bulbs as soon as they burn out.  Set up your furniture so you have a clear path. Avoid moving your furniture around.  If any of your floors are uneven, fix them.  If there are any pets around you, be aware of where they are.  Review your medicines with your doctor. Some medicines can make you feel dizzy. This can increase your chance of falling. Ask your doctor what other things that you can do to help prevent falls. This information is not intended to replace advice given to you by your health care provider. Make sure you discuss any questions you have with your health care provider. Document Released: 12/31/2008 Document Revised: 08/12/2015 Document Reviewed: 04/10/2014 Elsevier Interactive Patient Education  2017 [place]:  It is recommended that you get at least 30 minutes of aerobic exercise at least 5 days/week (for weight loss, you may need as much as 60-90 minutes). This can be any activity that gets your heart rate up. This can be divided in 10-15 minute intervals if needed, but try and build up your endurance at least once a week.  Weight bearing exercise is also recommended twice weekly.  Eat a healthy diet with lots of vegetables, fruits and fiber.  "Colorful" foods have a lot of vitamins (ie green vegetables, tomatoes, red peppers, etc).  Limit sweet tea, regular sodas and alcoholic beverages, all of which has a lot of calories and sugar.  Up to 1 alcoholic drink daily may be beneficial for women (unless trying to lose weight, watch sugars).  Drink a lot of water.  Calcium recommendations are 1200-1500 mg daily (1500 mg for postmenopausal women or women without ovaries), and vitamin D 1000 IU daily.  This should be obtained from diet and/or supplements (vitamins), and calcium should not be taken all at once, but  in divided doses.  Monthly self breast exams and yearly mammograms for women over the age of 40 is recommended.  Remember to change the batteries in your smoke detectors when changing your clock times in the spring and fall.  Use  your seat belt every time you are in a car, and please drive safely and not be distracted with cell phones and texting while driving.   [place] YOUR HANDS WELL AND FREQUENTLY. AVOID TOUCHING YOUR FACE, UNLESS YOUR HANDS ARE FRESHLY WASHED.   GET FRESH AIR DAILY. STAY HYDRATED WITH WATER.   It was a pleasure to see you and I look forward to continuing to work together on your health and well-being. Please do not hesitate to call the office if you need care or have questions about your care.  Have a wonderful day and week.  With Gratitude,  Cherly Beach, DNP, AGNP-BC

## 2018-06-10 ENCOUNTER — Ambulatory Visit: Payer: Medicare Other

## 2018-06-18 ENCOUNTER — Other Ambulatory Visit: Payer: Self-pay

## 2018-06-18 ENCOUNTER — Encounter: Payer: Self-pay | Admitting: Family Medicine

## 2018-06-18 MED ORDER — NIFEDIPINE ER OSMOTIC RELEASE 60 MG PO TB24: 60.0000 mg | ORAL_TABLET | Freq: Every day | ORAL | 1 refills | Status: DC

## 2018-06-18 MED ORDER — TRIAMTERENE-HCTZ 37.5-25 MG PO TABS: 1.0000 | ORAL_TABLET | Freq: Every day | ORAL | 1 refills | Status: DC

## 2018-06-18 MED ORDER — PRAVASTATIN SODIUM 80 MG PO TABS: 80.0000 mg | ORAL_TABLET | Freq: Every day | ORAL | 1 refills | Status: DC

## 2018-06-20 ENCOUNTER — Other Ambulatory Visit: Payer: Self-pay | Admitting: Family Medicine

## 2018-07-04 DIAGNOSIS — I10 Essential (primary) hypertension: Secondary | ICD-10-CM | POA: Diagnosis not present

## 2018-07-04 DIAGNOSIS — E785 Hyperlipidemia, unspecified: Secondary | ICD-10-CM | POA: Diagnosis not present

## 2018-07-04 DIAGNOSIS — E049 Nontoxic goiter, unspecified: Secondary | ICD-10-CM | POA: Diagnosis not present

## 2018-07-04 DIAGNOSIS — E063 Autoimmune thyroiditis: Secondary | ICD-10-CM | POA: Diagnosis not present

## 2018-07-10 ENCOUNTER — Inpatient Hospital Stay (HOSPITAL_COMMUNITY): Admission: RE | Admit: 2018-07-10 | Payer: Medicare Other | Source: Ambulatory Visit

## 2018-07-11 DIAGNOSIS — E039 Hypothyroidism, unspecified: Secondary | ICD-10-CM | POA: Diagnosis not present

## 2018-07-11 DIAGNOSIS — E063 Autoimmune thyroiditis: Secondary | ICD-10-CM | POA: Diagnosis not present

## 2018-07-11 DIAGNOSIS — E049 Nontoxic goiter, unspecified: Secondary | ICD-10-CM | POA: Diagnosis not present

## 2018-07-11 DIAGNOSIS — E669 Obesity, unspecified: Secondary | ICD-10-CM | POA: Diagnosis not present

## 2018-07-11 DIAGNOSIS — Z6832 Body mass index (BMI) 32.0-32.9, adult: Secondary | ICD-10-CM | POA: Diagnosis not present

## 2018-07-11 DIAGNOSIS — E785 Hyperlipidemia, unspecified: Secondary | ICD-10-CM | POA: Diagnosis not present

## 2018-07-11 DIAGNOSIS — I10 Essential (primary) hypertension: Secondary | ICD-10-CM | POA: Diagnosis not present

## 2018-07-18 ENCOUNTER — Encounter: Payer: Self-pay | Admitting: Family Medicine

## 2018-07-22 ENCOUNTER — Telehealth: Payer: Self-pay | Admitting: *Deleted

## 2018-07-22 NOTE — Telephone Encounter (Signed)
Pt called wanting to make sure all her medications are being sent to [facility] except for her vitamin D as that's over the counter. For some reason she got synthroid from the wrong pharmacy. She has a 90 supply but wants to make sure the next time it is sent to [facility]

## 2018-07-23 NOTE — Telephone Encounter (Signed)
Made sure primary pharmacy is [facility]. Deleted other pharmacy

## 2018-08-21 ENCOUNTER — Ambulatory Visit (HOSPITAL_COMMUNITY)
Admission: RE | Admit: 2018-08-21 | Discharge: 2018-08-21 | Disposition: A | Discharge status: Home / Self Care | Payer: Medicare Other | Source: Ambulatory Visit | Attending: Family Medicine | Admitting: Family Medicine

## 2018-08-21 ENCOUNTER — Other Ambulatory Visit: Payer: Self-pay

## 2018-08-21 DIAGNOSIS — Z780 Asymptomatic menopausal state: Secondary | ICD-10-CM | POA: Diagnosis not present

## 2018-08-21 NOTE — Progress Notes (Signed)
Good bone health. Scan was normal.

## 2018-09-24 ENCOUNTER — Encounter: Payer: Self-pay | Admitting: Family Medicine

## 2018-09-25 ENCOUNTER — Other Ambulatory Visit (HOSPITAL_COMMUNITY): Payer: Self-pay | Admitting: Family Medicine

## 2018-09-25 DIAGNOSIS — Z1231 Encounter for screening mammogram for malignant neoplasm of breast: Secondary | ICD-10-CM

## 2018-10-02 ENCOUNTER — Encounter: Payer: Self-pay | Admitting: Family Medicine

## 2018-10-03 ENCOUNTER — Other Ambulatory Visit: Payer: Self-pay | Admitting: Family Medicine

## 2018-10-03 MED ORDER — OYSTER SHELL CALCIUM/D 500-200 MG-UNIT PO TABS: ORAL_TABLET | ORAL | 1 refills | Status: DC

## 2018-10-07 ENCOUNTER — Other Ambulatory Visit: Payer: Self-pay

## 2018-11-04 ENCOUNTER — Encounter: Payer: Self-pay | Admitting: Family Medicine

## 2018-11-05 ENCOUNTER — Telehealth: Payer: Self-pay | Admitting: Family Medicine

## 2018-11-05 NOTE — Telephone Encounter (Signed)
pls see pt message and order labs , thanks

## 2018-11-06 ENCOUNTER — Telehealth: Payer: Self-pay

## 2018-11-06 DIAGNOSIS — I10 Essential (primary) hypertension: Secondary | ICD-10-CM | POA: Diagnosis not present

## 2018-11-06 DIAGNOSIS — R7301 Impaired fasting glucose: Secondary | ICD-10-CM | POA: Diagnosis not present

## 2018-11-06 DIAGNOSIS — E785 Hyperlipidemia, unspecified: Secondary | ICD-10-CM

## 2018-11-06 NOTE — Telephone Encounter (Signed)
Labs ordered.

## 2018-11-07 ENCOUNTER — Encounter: Payer: Self-pay | Admitting: Family Medicine

## 2018-11-07 LAB — COMPLETE METABOLIC PANEL WITH GFR
AG Ratio: 1.6 (calc) (ref 1.0–2.5)
ALT: 24 U/L (ref 6–29)
AST: 25 U/L (ref 10–35)
Albumin: 4.3 g/dL (ref 3.6–5.1)
Alkaline phosphatase (APISO): 90 U/L (ref 37–153)
BUN/Creatinine Ratio: 19 (calc) (ref 6–22)
BUN: 19 mg/dL (ref 7–25)
CO2: 30 mmol/L (ref 20–32)
Calcium: 10.3 mg/dL (ref 8.6–10.4)
Chloride: 98 mmol/L (ref 98–110)
Creat: 1 mg/dL — ABNORMAL HIGH (ref 0.60–0.93)
GFR, Est African American: 66 mL/min/{1.73_m2} (ref >=60)
GFR, Est Non African American: 57 mL/min/{1.73_m2} — ABNORMAL LOW (ref >=60)
Globulin: 2.7 g/dL (calc) (ref 1.9–3.7)
Glucose, Bld: 86 mg/dL (ref 65–99)
Potassium: 5 mmol/L (ref 3.5–5.3)
Sodium: 138 mmol/L (ref 135–146)
Total Bilirubin: 1 mg/dL (ref 0.2–1.2)
Total Protein: 7 g/dL (ref 6.1–8.1)

## 2018-11-07 LAB — LIPID PANEL
Cholesterol: 207 mg/dL — ABNORMAL HIGH (ref <200)
HDL: 74 mg/dL (ref >=50)
LDL Cholesterol (Calc): 116 mg/dL (calc) — ABNORMAL HIGH
Non-HDL Cholesterol (Calc): 133 mg/dL (calc) — ABNORMAL HIGH (ref <130)
Total CHOL/HDL Ratio: 2.8 (calc) (ref <5.0)
Triglycerides: 71 mg/dL (ref <150)

## 2018-11-07 LAB — HEMOGLOBIN A1C
Hgb A1c MFr Bld: 5.8 % of total Hgb — ABNORMAL HIGH (ref <5.7)
Mean Plasma Glucose: 120 (calc)
eAG (mmol/L): 6.6 (calc)

## 2018-11-07 LAB — CBC
HCT: 41.1 % (ref 35.0–45.0)
Hemoglobin: 13.5 g/dL (ref 11.7–15.5)
MCH: 26.8 pg — ABNORMAL LOW (ref 27.0–33.0)
MCHC: 32.8 g/dL (ref 32.0–36.0)
MCV: 81.7 fL (ref 80.0–100.0)
MPV: 10.4 fL (ref 7.5–12.5)
Platelets: 334 10*3/uL (ref 140–400)
RBC: 5.03 10*6/uL (ref 3.80–5.10)
RDW: 14 % (ref 11.0–15.0)
WBC: 5.9 10*3/uL (ref 3.8–10.8)

## 2018-11-11 ENCOUNTER — Encounter (HOSPITAL_COMMUNITY): Payer: Self-pay

## 2018-11-11 ENCOUNTER — Ambulatory Visit (HOSPITAL_COMMUNITY)
Admission: RE | Admit: 2018-11-11 | Discharge: 2018-11-11 | Disposition: A | Discharge status: Home / Self Care | Payer: Medicare Other | Source: Ambulatory Visit | Attending: Family Medicine | Admitting: Family Medicine

## 2018-11-11 ENCOUNTER — Encounter: Payer: Self-pay | Admitting: Family Medicine

## 2018-11-11 ENCOUNTER — Ambulatory Visit (INDEPENDENT_AMBULATORY_CARE_PROVIDER_SITE_OTHER): Payer: Medicare Other | Admitting: Family Medicine

## 2018-11-11 ENCOUNTER — Other Ambulatory Visit: Payer: Self-pay

## 2018-11-11 VITALS — BP 130/84 | HR 77 | Temp 97.9°F | Resp 15 | Ht 65.0 in | Wt 180.0 lb

## 2018-11-11 DIAGNOSIS — I10 Essential (primary) hypertension: Secondary | ICD-10-CM | POA: Diagnosis not present

## 2018-11-11 DIAGNOSIS — R7303 Prediabetes: Secondary | ICD-10-CM | POA: Diagnosis not present

## 2018-11-11 DIAGNOSIS — E8881 Metabolic syndrome: Secondary | ICD-10-CM

## 2018-11-11 DIAGNOSIS — R7301 Impaired fasting glucose: Secondary | ICD-10-CM | POA: Diagnosis not present

## 2018-11-11 DIAGNOSIS — Z23 Encounter for immunization: Secondary | ICD-10-CM | POA: Diagnosis not present

## 2018-11-11 DIAGNOSIS — E785 Hyperlipidemia, unspecified: Secondary | ICD-10-CM

## 2018-11-11 DIAGNOSIS — E663 Overweight: Secondary | ICD-10-CM | POA: Diagnosis not present

## 2018-11-11 DIAGNOSIS — Z1231 Encounter for screening mammogram for malignant neoplasm of breast: Secondary | ICD-10-CM | POA: Insufficient documentation

## 2018-11-11 NOTE — Assessment & Plan Note (Signed)
The increased risk of cardiovascular disease associated with this diagnosis, and the need to consistently work on lifestyle to change this is discussed. Following  a  heart healthy diet ,commitment to 30 minutes of exercise at least 5 days per week, as well as control of blood sugar and cholesterol , and achieving a healthy weight are all the areas to be addressed .  

## 2018-11-11 NOTE — Assessment & Plan Note (Signed)
Hyperlipidemia:Low fat diet discussed and encouraged.   Lipid Panel  Lab Results  Component Value Date   CHOL 207 (H) 11/06/2018   HDL 74 11/06/2018   LDLCALC 116 (H) 11/06/2018   TRIG 71 11/06/2018   CHOLHDL 2.8 11/06/2018   Not at goal , need to reduce fried and fatty

## 2018-11-11 NOTE — Assessment & Plan Note (Signed)
  Patient re-educated about  the importance of commitment to a  minimum of 150 minutes of exercise per week as able.  The importance of healthy food choices with portion control discussed, as well as eating regularly and within a 12 hour window most days. The need to choose "clean , green" food 50 to 75% of the time is discussed, as well as to make water the primary drink and set a goal of 64 ounces water daily.    Weight /BMI 11/11/2018 06/05/2018 03/19/2018  WEIGHT 180 lb 179 lb 0.6 oz 179 lb  HEIGHT 5\' 5"  5\' 5"  5\' 5"   BMI 29.95 kg/m2 29.79 kg/m2 29.79 kg/m2

## 2018-11-11 NOTE — Patient Instructions (Signed)
F/U with mD in 6 months, call   If you need me before  Flu vaccine today  Please commit to regular exercise, and reduce fried and fatty foods   Please get fasting lipid, cmp and EGFr, and HBA1C 2nd week in March  Thanks for choosing [facility], we consider it a privelige to serve you.

## 2018-11-11 NOTE — Assessment & Plan Note (Signed)
Controlled, no change in medication DASH diet and commitment to daily physical activity for a minimum of 30 minutes discussed and encouraged, as a part of hypertension management. The importance of attaining a healthy weight is also discussed.  BP/Weight 11/11/2018 06/05/2018 03/19/2018 10/16/2017 06/04/2017 04/04/2017 [id number]  Systolic BP [id number]  Diastolic BP 84 80 82 80 82 76 69  Wt. (Lbs) 180 179.04 179 182 185.25 185 190  BMI 29.95 29.79 29.79 30.29 30.83 30.79 31.62

## 2018-11-11 NOTE — Progress Notes (Signed)
Christina Carroll     MRN: [id number]      DOB: Jul 20, 1945   HPI Christina Carroll for follow up and re-evaluation of chronic medical conditions, medication management and review of any available recent lab and radiology data.  Preventive health is updated, specifically  Cancer screening and Immunization.   Questions or concerns regarding consultations or procedures which the PT has had in the interim are  addressed. The PT denies any adverse reactions to current medications since the last visit.  Long h/o bilateral ankle and foot pain, keepin her awake an additional 20 mins, but getting her avg sleep of 5 to 6 hrs/ night No regular exercise still  ROS Denies recent fever or chills. Denies sinus pressure, nasal congestion, ear pain or sore throat. Denies chest congestion, productive cough or wheezing. Denies chest pains, palpitations and leg swelling Denies abdominal pain, nausea, vomiting,diarrhea or constipation.   Denies dysuria, frequency, hesitancy or incontinence. Shoulder improved but not 100 % Denies headaches, seizures, numbness, or tingling. Denies depression, anxiety or insomnia. Denies skin break down or rash.   PE  BP 130/84   Pulse 77   Temp 97.9 F (36.6 C) (Temporal)   Resp 15   Ht 5\' 5"  (1.651 m)   Wt 180 lb (81.6 kg)   SpO2 99%   BMI 29.95 kg/m   Patient alert and oriented and in no cardiopulmonary distress.  HEENT: No facial asymmetry, EOMI,   oropharynx pink and moist.  Neck supple no JVD, no mass.  Chest: Clear to auscultation bilaterally.  CVS: S1, S2 no murmurs, no S3.Regular rate.  ABD: Soft non tender.   Ext: No edema  MS: Adequate ROM spine, shoulders, hips and knees.  Skin: Intact, no ulcerations or rash noted.  Psych: Good eye contact, normal affect. Memory intact not anxious or depressed appearing.  CNS: CN 2-12 intact, power,  normal throughout.no focal deficits noted.   Assessment & Plan  Essential hypertension  Controlled, no change in medication DASH diet and commitment to daily physical activity for a minimum of 30 minutes discussed and encouraged, as a part of hypertension management. The importance of attaining a healthy weight is also discussed.  BP/Weight 11/11/2018 06/05/2018 03/19/2018 10/16/2017 06/04/2017 04/04/2017 123XX123  Systolic BP [id number] A999333 A999333 AB-123456789 AB-123456789 [id number]  Diastolic BP 84 80 82 80 82 76 69  Wt. (Lbs) 180 179.04 179 182 185.25 185 190  BMI 29.95 29.79 29.79 30.29 30.83 30.79 31.62       Hyperlipidemia Hyperlipidemia:Low fat diet discussed and encouraged.   Lipid Panel  Lab Results  Component Value Date   CHOL 207 (H) 11/06/2018   HDL 74 11/06/2018   LDLCALC 116 (H) 11/06/2018   TRIG 71 11/06/2018   CHOLHDL 2.8 11/06/2018   Not at goal , need to reduce fried and fatty     Overweight (BMI 25.0-29.9)  Patient re-educated about  the importance of commitment to a  minimum of 150 minutes of exercise per week as able.  The importance of healthy food choices with portion control discussed, as well as eating regularly and within a 12 hour window most days. The need to choose "clean , green" food 50 to 75% of the time is discussed, as well as to make water the primary drink and set a goal of 64 ounces water daily.    Weight /BMI 11/11/2018 06/05/2018 03/19/2018  WEIGHT 180 lb 179 lb 0.6 oz 179 lb  HEIGHT 5\' 5"  5\' 5"   5\' 5"   BMI 29.95 kg/m2 29.79 kg/m2 29.79 kg/m2      Metabolic syndrome X The increased risk of cardiovascular disease associated with this diagnosis, and the need to consistently work on lifestyle to change this is discussed. Following  a  heart healthy diet ,commitment to 30 minutes of exercise at least 5 days per week, as well as control of blood sugar and cholesterol , and achieving a healthy weight are all the areas to be addressed .   Prediabetes Improving, will work on this  Patient educated about the importance of limiting  Carbohydrate intake ,  the need to commit to daily physical activity for a minimum of 30 minutes , and to commit weight loss. The fact that changes in all these areas will reduce or eliminate all together the development of diabetes is stressed.   Diabetic Labs Latest Ref Rng & Units 11/06/2018 03/27/2018 10/10/2017 03/29/2017 08/11/2016  HbA1c <5.7 % of total Hgb 5.8(H) 5.9(H) 5.8(H) - 5.8(H)  Chol <200 mg/dL 207(H) 197 210(H) 198 180  HDL > OR = 50 mg/dL 74 63 74 70 72  Calc LDL mg/dL (calc) 116(H) 115(H) 120(H) 109(H) 96  Triglycerides <150 mg/dL 71 88 72 99 61  Creatinine 0.60 - 0.93 mg/dL 1.00(H) 0.91 0.91 1.09(H) 0.94   BP/Weight 11/11/2018 06/05/2018 03/19/2018 10/16/2017 06/04/2017 04/04/2017 [id number]  Systolic BP [id number] A999333 A999333 AB-123456789 AB-123456789 [id number] [id number]  Diastolic BP 84 80 82 80 82 76 69  Wt. (Lbs) 180 179.04 179 182 185.25 185 190  BMI 29.95 29.79 29.79 30.29 30.83 30.79 31.62   No flowsheet data found.

## 2018-11-11 NOTE — Assessment & Plan Note (Signed)
Improving, will work on this  Patient educated about the importance of limiting  Carbohydrate intake , the need to commit to daily physical activity for a minimum of 30 minutes , and to commit weight loss. The fact that changes in all these areas will reduce or eliminate all together the development of diabetes is stressed.   Diabetic Labs Latest Ref Rng & Units 11/06/2018 03/27/2018 10/10/2017 03/29/2017 08/11/2016  HbA1c <5.7 % of total Hgb 5.8(H) 5.9(H) 5.8(H) - 5.8(H)  Chol <200 mg/dL 207(H) 197 210(H) 198 180  HDL > OR = 50 mg/dL 74 63 74 70 72  Calc LDL mg/dL (calc) 116(H) 115(H) 120(H) 109(H) 96  Triglycerides <150 mg/dL 71 88 72 99 61  Creatinine 0.60 - 0.93 mg/dL 1.00(H) 0.91 0.91 1.09(H) 0.94   BP/Weight 11/11/2018 06/05/2018 03/19/2018 10/16/2017 06/04/2017 04/04/2017 [id number]  Systolic BP AB-[id number] A999333 A999333 AB-123456789 AB-123456789 123456 Q000111Q  Diastolic BP 84 80 82 80 82 76 69  Wt. (Lbs) 180 179.04 179 182 185.25 185 190  BMI 29.95 29.79 29.79 30.29 30.83 30.79 31.62   No flowsheet data found.

## 2018-11-18 ENCOUNTER — Other Ambulatory Visit: Payer: Self-pay | Admitting: Family Medicine

## 2018-12-26 DIAGNOSIS — E049 Nontoxic goiter, unspecified: Secondary | ICD-10-CM | POA: Diagnosis not present

## 2018-12-26 DIAGNOSIS — I10 Essential (primary) hypertension: Secondary | ICD-10-CM | POA: Diagnosis not present

## 2018-12-26 DIAGNOSIS — E063 Autoimmune thyroiditis: Secondary | ICD-10-CM | POA: Diagnosis not present

## 2018-12-26 DIAGNOSIS — E669 Obesity, unspecified: Secondary | ICD-10-CM | POA: Diagnosis not present

## 2018-12-26 DIAGNOSIS — E785 Hyperlipidemia, unspecified: Secondary | ICD-10-CM | POA: Diagnosis not present

## 2019-01-02 DIAGNOSIS — E785 Hyperlipidemia, unspecified: Secondary | ICD-10-CM | POA: Diagnosis not present

## 2019-01-02 DIAGNOSIS — I10 Essential (primary) hypertension: Secondary | ICD-10-CM | POA: Diagnosis not present

## 2019-01-02 DIAGNOSIS — E039 Hypothyroidism, unspecified: Secondary | ICD-10-CM | POA: Diagnosis not present

## 2019-01-02 DIAGNOSIS — E063 Autoimmune thyroiditis: Secondary | ICD-10-CM | POA: Diagnosis not present

## 2019-01-02 DIAGNOSIS — Z6832 Body mass index (BMI) 32.0-32.9, adult: Secondary | ICD-10-CM | POA: Diagnosis not present

## 2019-01-02 DIAGNOSIS — E669 Obesity, unspecified: Secondary | ICD-10-CM | POA: Diagnosis not present

## 2019-01-02 DIAGNOSIS — E049 Nontoxic goiter, unspecified: Secondary | ICD-10-CM | POA: Diagnosis not present

## 2019-01-14 ENCOUNTER — Other Ambulatory Visit: Payer: Self-pay

## 2019-01-14 ENCOUNTER — Other Ambulatory Visit: Payer: Self-pay | Admitting: Family Medicine

## 2019-01-14 MED ORDER — OYSTER SHELL CALCIUM/D 500-200 MG-UNIT PO TABS: ORAL_TABLET | ORAL | 1 refills | Status: DC

## 2019-01-17 ENCOUNTER — Telehealth: Payer: Self-pay | Admitting: *Deleted

## 2019-01-17 ENCOUNTER — Other Ambulatory Visit: Payer: Self-pay

## 2019-01-17 MED ORDER — VITAMIN D 50 MCG (2000 UT) PO CAPS: 1.0000 | ORAL_CAPSULE | Freq: Every day | ORAL | 3 refills | Status: DC

## 2019-01-17 NOTE — Telephone Encounter (Signed)
Pt called needing a refill on her vitamin D sent to [facility] she is out of her medication.

## 2019-01-17 NOTE — Telephone Encounter (Signed)
Prescription refilled.

## 2019-01-22 ENCOUNTER — Other Ambulatory Visit: Payer: Self-pay | Admitting: Family Medicine

## 2019-01-27 ENCOUNTER — Telehealth: Payer: Self-pay | Admitting: *Deleted

## 2019-01-27 NOTE — Telephone Encounter (Signed)
Pt called she has been trying to get her vitamin D filled for a couple of weeks. She did not understand why she cannot get the medication. She has been out for three weeks. She uses [facility].

## 2019-01-28 ENCOUNTER — Other Ambulatory Visit: Payer: Self-pay | Admitting: Family Medicine

## 2019-01-28 NOTE — Telephone Encounter (Signed)
Patient told it was sent in 10/30 and she will go there and see if its because its otc and see if they can fill it as a prescription

## 2019-04-25 DIAGNOSIS — Z23 Encounter for immunization: Secondary | ICD-10-CM | POA: Diagnosis not present

## 2019-05-07 DIAGNOSIS — E785 Hyperlipidemia, unspecified: Secondary | ICD-10-CM | POA: Diagnosis not present

## 2019-05-07 DIAGNOSIS — I10 Essential (primary) hypertension: Secondary | ICD-10-CM | POA: Diagnosis not present

## 2019-05-07 DIAGNOSIS — R7301 Impaired fasting glucose: Secondary | ICD-10-CM | POA: Diagnosis not present

## 2019-05-08 LAB — HEMOGLOBIN A1C
Hgb A1c MFr Bld: 5.9 % of total Hgb — ABNORMAL HIGH (ref <5.7)
Mean Plasma Glucose: 123 (calc)
eAG (mmol/L): 6.8 (calc)

## 2019-05-08 LAB — COMPLETE METABOLIC PANEL WITH GFR
AG Ratio: 1.7 (calc) (ref 1.0–2.5)
ALT: 25 U/L (ref 6–29)
AST: 24 U/L (ref 10–35)
Albumin: 4.4 g/dL (ref 3.6–5.1)
Alkaline phosphatase (APISO): 90 U/L (ref 37–153)
BUN/Creatinine Ratio: 17 (calc) (ref 6–22)
BUN: 17 mg/dL (ref 7–25)
CO2: 27 mmol/L (ref 20–32)
Calcium: 10.1 mg/dL (ref 8.6–10.4)
Chloride: 99 mmol/L (ref 98–110)
Creat: 1.03 mg/dL — ABNORMAL HIGH (ref 0.60–0.93)
GFR, Est African American: 63 mL/min/{1.73_m2} (ref >=60)
GFR, Est Non African American: 55 mL/min/{1.73_m2} — ABNORMAL LOW (ref >=60)
Globulin: 2.6 g/dL (calc) (ref 1.9–3.7)
Glucose, Bld: 91 mg/dL (ref 65–99)
Potassium: 3.9 mmol/L (ref 3.5–5.3)
Sodium: 139 mmol/L (ref 135–146)
Total Bilirubin: 0.6 mg/dL (ref 0.2–1.2)
Total Protein: 7 g/dL (ref 6.1–8.1)

## 2019-05-08 LAB — LIPID PANEL
Cholesterol: 205 mg/dL — ABNORMAL HIGH (ref <200)
HDL: 72 mg/dL (ref >=50)
LDL Cholesterol (Calc): 115 mg/dL (calc) — ABNORMAL HIGH
Non-HDL Cholesterol (Calc): 133 mg/dL (calc) — ABNORMAL HIGH (ref <130)
Total CHOL/HDL Ratio: 2.8 (calc) (ref <5.0)
Triglycerides: 84 mg/dL (ref <150)

## 2019-05-09 ENCOUNTER — Encounter: Payer: Self-pay | Admitting: Family Medicine

## 2019-05-09 ENCOUNTER — Other Ambulatory Visit: Payer: Self-pay

## 2019-05-09 ENCOUNTER — Ambulatory Visit (INDEPENDENT_AMBULATORY_CARE_PROVIDER_SITE_OTHER): Payer: Medicare Other | Admitting: Family Medicine

## 2019-05-09 DIAGNOSIS — E663 Overweight: Secondary | ICD-10-CM | POA: Diagnosis not present

## 2019-05-09 DIAGNOSIS — I10 Essential (primary) hypertension: Secondary | ICD-10-CM | POA: Diagnosis not present

## 2019-05-09 DIAGNOSIS — R7303 Prediabetes: Secondary | ICD-10-CM

## 2019-05-09 DIAGNOSIS — E785 Hyperlipidemia, unspecified: Secondary | ICD-10-CM | POA: Diagnosis not present

## 2019-05-09 DIAGNOSIS — G2581 Restless legs syndrome: Secondary | ICD-10-CM | POA: Diagnosis not present

## 2019-05-09 NOTE — Progress Notes (Signed)
Virtual Visit via Telephone Note   This visit type was conducted due to national recommendations for restrictions regarding the COVID-19 Pandemic (e.g. social distancing) in an effort to limit this patient's exposure and mitigate transmission in our community.  Due to her co-morbid illnesses, this patient is at least at moderate risk for complications without adequate follow up.  This format is felt to be most appropriate for this patient at this time.  The patient did not have access to video technology/had technical difficulties with video requiring transitioning to audio format only (telephone).  All issues noted in this document were discussed and addressed.  No physical exam could be performed with this format.    Evaluation Performed:  Follow-up visit  Date:  05/09/2019   ID:  Christina Carroll, DOB 01/23/1946, MRN [id number]  Patient Location: Home Provider Location: Office  Location of Patient: Home Location of Provider: Telehealth Consent was obtain for visit to be over via telehealth. I verified that I am speaking with the correct person using two identifiers.  PCP:  Fayrene Helper, MD   Chief Complaint:  Follow up for chronic conditions  History of Present Illness:    Christina Carroll is a 74 y.o. female with history of arthritis, hypertension, hyperlipidemia, obesity, thyroiditis, prediabetic.  Presents today for follow-up on the above conditions.  She reports that she has not been eating the best of diets but is trying to eat a little bit better she does use butters/margarine still with her cooking.  She also has tried to reduce the amount of sugar and eat a more natural sugar by a company called [employer].  She reports that she will make muffins from time to time for herself in the morning.  She tries to eat a lot of baked chicken to the point where she is sick of chicken she said.  She drinks too much water that she can stay on the bathroom most of the time.   She does admit that she is not able to exercise or workout at this time as she thinks that impacted a lot of how her health is at this time.  She reports she takes all her medications as directed and does not have any issues with them.  She denies having any need for refills at this time.  Her biggest concern is that she feels like she still has restless leg going on she was previously provided with a medication that she cannot remember the name of by Dr. Moshe Cipro but she looked up the side effects and decided that she did not want to be on that medication.  But she reports that she still continues to have discomfort/restlessness when she is trying to sleep.  She reports that it is very hard to describe at times.  It appears that she is already supplementing with vitamin D and iron.  She does not seem to be anemic and her last labs in August.  Most recent labs show that she has had no significant reduction in her cholesterol levels with a .01 increase in her A1c.  In addition her kidney function went from 66-63.  She does not have a blood pressure reading for me she does not like to check her blood pressure she does not trust herself with the blood pressure cuff.  She would like to come into the office here soon to see if she can get assessed for her restless leg and make sure her blood pressures are doing  good.  She denies having any chest pain, headaches, dizziness, vision changes, cough, shortness of breath, exposure to Covid.  She denies having any changes in her bowel or bladder habits.  She denies having any blood in her stool or urine.  She reports that she sleeps okay outside of the restless leg.  She reports that she eats well.  She denies having any recent falls.  She reports that her mood is good.  She denies having any skin issues or concerns.  No changes in memory either.  The patient does not have symptoms concerning for COVID-19 infection (fever, chills, cough, or new shortness of breath).    Past Medical, Surgical, Social History, Allergies, and Medications have been Reviewed.  Past Medical History:  Diagnosis Date  . Arthritis   . Essential hypertension   . Hyperlipidemia   . Lymphocytic thyroiditis   . Obesity   . Prediabetes    Past Surgical History:  Procedure Laterality Date  . Bilateral foot surgery bone removed  2000  . COLONOSCOPY  11/30/2011   Procedure: COLONOSCOPY;  Surgeon: Rogene Houston, MD;  Location: [facility];  Service: Endoscopy;  Laterality: N/A;  830  . Lumpectomy removed from thoracic spine area  1999     No outpatient medications have been marked as taking for the 05/09/19 encounter (Appointment) with Perlie Mayo, NP.     Allergies:   Patient has no known allergies.   ROS:   Please see the history of present illness.    All other systems reviewed and are negative.   Labs/Other Tests and Data Reviewed:    Recent Labs: 11/06/2018: Hemoglobin 13.5; Platelets 334 05/07/2019: ALT 25; BUN 17; Creat 1.03; Potassium 3.9; Sodium 139   Recent Lipid Panel Lab Results  Component Value Date/Time   CHOL 205 (H) 05/07/2019 08:34 AM   TRIG 84 05/07/2019 08:34 AM   HDL 72 05/07/2019 08:34 AM   CHOLHDL 2.8 05/07/2019 08:34 AM   LDLCALC 115 (H) 05/07/2019 08:34 AM    Wt Readings from Last 3 Encounters:  11/11/18 180 lb (81.6 kg)  06/05/18 179 lb 0.6 oz (81.2 kg)  03/19/18 179 lb (81.2 kg)     Objective:    Vital Signs:  There were no vitals taken for this visit.   GEN:  Alert and oriented RESPIRATORY:  No shortness of breath noted in conversation PSYCH:  Normal affect and mood  ASSESSMENT & PLAN:    1. Overweight (BMI 25.0-29.9)  2. Essential hypertension  3. Hyperlipidemia, unspecified hyperlipidemia type  4. Prediabetes  5. Restless leg    Time:   Today, I have spent 20 minutes with the patient with telehealth technology discussing the above problems.     Medication Adjustments/Labs and Tests Ordered: Current  medicines are reviewed at length with the patient today.  Concerns regarding medicines are outlined above.   Tests Ordered: No orders of the defined types were placed in this encounter.   Medication Changes: No orders of the defined types were placed in this encounter.   Disposition:  Follow up 3 months  Signed, Perlie Mayo, NP  05/09/2019 8:30 AM     [facility]

## 2019-05-09 NOTE — Assessment & Plan Note (Signed)
Encourage low-fat diet 

## 2019-05-09 NOTE — Patient Instructions (Addendum)
Happy New Year! May you have a year filled with hope, love, happiness and laughter.  I appreciate the opportunity to provide you with care for your health and wellness. Today we discussed: Overall health  Follow up: 2-3 weeks in the office for restless leg  No labs or referrals today  Please continue to practice social distancing to keep you, your family, and our community safe.  If you must go out, please wear a mask and practice good handwashing.  It was a pleasure to see you and I look forward to continuing to work together on your health and well-being. Please do not hesitate to call the office if you need care or have questions about your care.  Have a wonderful day and week. With Gratitude, Cherly Beach, DNP, AGNP-BC   Restless Legs Syndrome Restless legs syndrome is a condition that causes uncomfortable feelings or sensations in the legs, especially while sitting or lying down. The sensations usually cause an overwhelming urge to move the legs. The arms can also sometimes be affected. The condition can range from mild to severe. The symptoms often interfere with a person's ability to sleep. How is this treated? General instructions  Take over-the-counter and prescription medicines only as told by your health care provider.  Use methods to help relieve the uncomfortable sensations, such as: ? Massaging your legs. ? Walking or stretching. ? Taking a cold or hot bath.  Keep all follow-up visits as told by your health care provider. This is important. Lifestyle  Practice good sleep habits. For example, go to bed and get up at the same time every day. Most adults should get 7-9 hours of sleep each night.   Exercise regularly. Try to get at least 30 minutes of exercise most days of the week.   Practice ways of relaxing, such as yoga or meditation.

## 2019-05-09 NOTE — Assessment & Plan Note (Signed)
Christina Carroll is re-educated about the importance of exercise daily to help with weight management. A minumum of 30 minutes daily is recommended. Additionally, importance of healthy food choices  with portion control discussed.  Wt Readings from Last 3 Encounters:  11/11/18 180 lb (81.6 kg)  06/05/18 179 lb 0.6 oz (81.2 kg)  03/19/18 179 lb (81.2 kg)

## 2019-05-09 NOTE — Assessment & Plan Note (Signed)
Is currently on a vitamin and gets plenty of vitamin D.  She also did not want to take the medication as previously prescribed for this due to the side effects.  Is not currently exercising which is possibly part of the cause of the discomfort and not being able to sleep.  Strongly encouraged the use of stretching and exercise at least 30 minutes a day.  In addition to some information provided from the system attached to the AVS.  Will follow up with her in 2 to 3 weeks to make sure that there is nothing vascular going on that could be causing the discomfort. Patient acknowledged agreement and understanding of the plan.

## 2019-05-09 NOTE — Assessment & Plan Note (Signed)
A1c 5.9% Reduce-sugar foods

## 2019-05-09 NOTE — Assessment & Plan Note (Signed)
Christina Carroll is encouraged to maintain a well balanced diet that is low in salt. Controlled, continue current medication regimen.  Additionally, she is also reminded that exercise is beneficial for heart health and control of Blood pressure. 30-60 minutes daily is recommended-walking was suggested.

## 2019-05-14 ENCOUNTER — Ambulatory Visit: Payer: Medicare Other | Admitting: Family Medicine

## 2019-05-19 ENCOUNTER — Ambulatory Visit: Payer: Medicare Other | Admitting: Family Medicine

## 2019-05-23 DIAGNOSIS — Z23 Encounter for immunization: Secondary | ICD-10-CM | POA: Diagnosis not present

## 2019-05-30 ENCOUNTER — Ambulatory Visit (INDEPENDENT_AMBULATORY_CARE_PROVIDER_SITE_OTHER): Payer: Medicare Other | Admitting: Family Medicine

## 2019-05-30 ENCOUNTER — Encounter: Payer: Self-pay | Admitting: Family Medicine

## 2019-05-30 ENCOUNTER — Other Ambulatory Visit: Payer: Self-pay

## 2019-05-30 VITALS — BP 130/84 | HR 97 | Temp 97.4°F | Resp 16 | Ht 65.0 in | Wt 175.0 lb

## 2019-05-30 DIAGNOSIS — G2581 Restless legs syndrome: Secondary | ICD-10-CM

## 2019-05-30 MED ORDER — MAGNESIUM OXIDE -MG SUPPLEMENT 200 MG PO TABS: 200.0000 mg | ORAL_TABLET | Freq: Every day | ORAL | 0 refills | Status: DC

## 2019-05-30 NOTE — Patient Instructions (Addendum)
I appreciate the opportunity to provide you with care for your health and wellness. Today we discussed: restless leg   Follow up: 1 month   No labs or referrals today  Start magnesium (oxide or citrate) 200mg  at night, if causing too many bathroom trips take every other day or split in half.  Check your multiple for iron and see if magnesium (100-200 mg)   Please continue to practice social distancing to keep you, your family, and our community safe.  If you must go out, please wear a mask and practice good handwashing.  It was a pleasure to see you and I look forward to continuing to work together on your health and well-being. Please do not hesitate to call the office if you need care or have questions about your care.  Have a wonderful day and week. With Gratitude, Cherly Beach, DNP, AGNP-BC

## 2019-05-30 NOTE — Progress Notes (Signed)
Subjective:  Patient ID: Christina Carroll, female    DOB: 07/15/1945  Age: 74 y.o. MRN: [id number]  CC:  Chief Complaint  Patient presents with  . Restless leg       HPI  HPI Christina Carroll is a 74 year old female patient of Dr Simpson's. Well know to the clinic. Comes in today for restless leg. She has had this going on for years, feeling in her legs and sometimes her hands, she describes it as creepy crawly feeling that she has to move them around to make it feel better. Does not recall having previous treatment. Is not working out or exercising, but she knows would help some.  Today patient denies signs and symptoms of COVID 19 infection including fever, chills, cough, shortness of breath, and headache. Past Medical, Surgical, Social History, Allergies, and Medications have been Reviewed.   Past Medical History:  Diagnosis Date  . Arthritis   . BACK PAIN 02/02/2010   Qualifier: Diagnosis of  By: Moshe Cipro MD, Joycelyn Schmid    . Essential hypertension   . Hyperlipidemia   . Lymphocytic thyroiditis   . Obesity   . Prediabetes     Current Meds  Medication Sig  . aspirin (ASPIRIN LOW DOSE) 81 MG EC tablet Take 81 mg by mouth daily.   . calcium-vitamin D (OSCAL WITH D) 500-200 MG-UNIT TABS tablet TAKE (1) TABLET BY MOUTH (3) TIMES DAILY.  Marland Kitchen Chlorophyll 100 MG TABS Take 200 mg by mouth daily.  . Cholecalciferol (VITAMIN D) 50 MCG (2000 UT) CAPS Take 1 capsule (2,000 Units total) by mouth daily.  . Multiple Vitamin (MULTIVITAMIN) tablet Take 1 tablet by mouth daily.  Marland Kitchen NIFEdipine (PROCARDIA XL/NIFEDICAL XL) 60 MG 24 hr tablet TAKE 1 TABLET BY MOUTH  DAILY  . pravastatin (PRAVACHOL) 80 MG tablet TAKE 1 TABLET BY MOUTH  DAILY  . SYNTHROID 25 MCG tablet   . triamterene-hydrochlorothiazide (MAXZIDE-25) 37.5-25 MG tablet TAKE 1 TABLET BY MOUTH  DAILY    ROS:  Review of Systems  Constitutional: Negative.   HENT: Negative.   Eyes: Negative.   Respiratory: Negative.     Cardiovascular: Negative.   Gastrointestinal: Negative.   Genitourinary: Negative.   Musculoskeletal:       See HPI  Skin: Negative.   Neurological: Negative.   Endo/Heme/Allergies: Negative.   Psychiatric/Behavioral: Negative.   All other systems reviewed and are negative.   Objective:   Today's Vitals: BP 130/84   Pulse 97   Temp (!) 97.4 F (36.3 C) (Temporal)   Resp 16   Ht 5\' 5"  (1.651 m)   Wt 175 lb (79.4 kg)   SpO2 97%   BMI 29.12 kg/m  Vitals with BMI 05/30/2019 11/11/2018 06/05/2018  Height 5\' 5"  5\' 5"  5\' 5"   Weight 175 lbs 180 lbs 179 lbs 1 oz  BMI 29.12 [id number] [id number]  Systolic [id number] AB-123456789 A999333  Diastolic 84 84 80  Pulse 97 77 88     Physical Exam Vitals and nursing note reviewed.  Constitutional:      Appearance: Normal appearance. She is well-developed, well-groomed and overweight.  HENT:     Head: Normocephalic and atraumatic.     Right Ear: External ear normal.     Left Ear: External ear normal.     Mouth/Throat:     Comments: Mask in place  Eyes:     General:        Right eye: No discharge.  Left eye: No discharge.     Conjunctiva/sclera: Conjunctivae normal.  Cardiovascular:     Rate and Rhythm: Normal rate and regular rhythm.     Pulses: Normal pulses.     Heart sounds: Normal heart sounds.  Pulmonary:     Effort: Pulmonary effort is normal.     Breath sounds: Normal breath sounds.  Musculoskeletal:        General: Normal range of motion.     Cervical back: Normal range of motion and neck supple.  Skin:    General: Skin is warm.  Neurological:     General: No focal deficit present.     Mental Status: She is alert and oriented to person, place, and time.  Psychiatric:        Attention and Perception: Attention normal.        Mood and Affect: Mood normal.        Speech: Speech normal.        Behavior: Behavior normal. Behavior is cooperative.        Thought Content: Thought content normal.        Cognition and Memory: Cognition normal.         Judgment: Judgment normal.          Assessment   1. Restless leg     Tests ordered No orders of the defined types were placed in this encounter.    Plan: Please see assessment and plan per problem list above.   Meds ordered this encounter  Medications  . Magnesium Oxide 200 MG TABS    Sig: Take 1 tablet (200 mg total) by mouth at bedtime.    Dispense:  30 tablet    Refill:  0    Order Specific Question:   Supervising Provider    Answer:   Fayrene Helper [id number]    Patient to follow-up in 1 month.  Perlie Mayo, NP

## 2019-06-02 ENCOUNTER — Encounter: Payer: Self-pay | Admitting: Family Medicine

## 2019-06-02 NOTE — Assessment & Plan Note (Signed)
Today in PE she did not have any vascular changes noted. Will try magnesium to see if that aids. Is not anemic/iron def.  Again encouraged stretching and walking daily to help.

## 2019-06-09 ENCOUNTER — Ambulatory Visit: Payer: Medicare Other

## 2019-06-09 ENCOUNTER — Other Ambulatory Visit: Payer: Self-pay

## 2019-06-09 ENCOUNTER — Ambulatory Visit
Admission: EM | Admit: 2019-06-09 | Discharge: 2019-06-09 | Disposition: A | Discharge status: Home / Self Care | Payer: Medicare Other

## 2019-06-09 DIAGNOSIS — Z711 Person with feared health complaint in whom no diagnosis is made: Secondary | ICD-10-CM

## 2019-06-09 NOTE — ED Triage Notes (Signed)
Pt presents with c/o left eye irritation that began about a week ago, pt states eye is a little itchy and tearing but no redness

## 2019-06-09 NOTE — Discharge Instructions (Addendum)
Symptom is more likely from allergy symptoms Patient was advised to return for new or worsening symptoms. Reviewed expectations re: course of current medical issues. Questions answered. Outlined signs and symptoms indicating need for more acute intervention. Patient verbalized understanding. After Visit Summary given.

## 2019-06-09 NOTE — ED Provider Notes (Signed)
[facility]    CSN: [id number] Arrival date & time: 06/09/19  [id number]      History   Chief Complaint Chief Complaint  Patient presents with  . Conjunctivitis    HPI Christina Carroll is a 74 y.o. female.   Who presented to the urgent care with a complaint of watery eye last week that is now resolved.  Denies a precipitating event, trauma, or personal contact with bacterial conjunctivitis.  Report she is going to see her son in [place] and would like to make sure she does not  have a pinkeye.  Has not tried any OTC eyedrops.  Nothing makes her symptoms worse. Currently denies eye pain, discharge, itching, eye redness, crusting or pus, vision changes, double vision, FB sensation.    The history is provided by the patient. No language interpreter was used.  Conjunctivitis    Past Medical History:  Diagnosis Date  . Arthritis   . BACK PAIN 02/02/2010   Qualifier: Diagnosis of  By: Moshe Cipro MD, Joycelyn Schmid    . Essential hypertension   . Hyperlipidemia   . Lymphocytic thyroiditis   . NEVUS 05/12/2010   Qualifier: Diagnosis of  By: Moshe Cipro MD, Joycelyn Schmid    . Obesity   . Prediabetes   . TENDINITIS 02/02/2010   Qualifier: Diagnosis of  By: Moshe Cipro MD, Margaret      Patient Active Problem List   Diagnosis Date Noted  . Allergic sinusitis 03/19/2018  . Hearing loss 03/19/2018  . Lack of exercise 04/04/2017  . Primary osteoarthritis, 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  . Metabolic syndrome X [id number]  . Seasonal allergies 07/10/2012  . Prediabetes 02/22/2012  . Restless leg 09/19/2011  . Hyperlipidemia 08/30/2007  . Overweight (BMI 25.0-29.9) 08/30/2007  . Essential hypertension 08/30/2007    Past Surgical History:  Procedure Laterality Date  . Bilateral foot surgery bone removed  2000  . COLONOSCOPY  11/30/2011   Procedure: COLONOSCOPY;  Surgeon: Rogene Houston, MD;  [facility];  Service: Endoscopy;  Laterality: N/A;  830  . Lumpectomy removed from  thoracic spine area  1999    OB History   No obstetric history on file.      Home Medications    Prior to Admission medications   Medication Sig Start Date End Date Taking? Authorizing Provider  aspirin (ASPIRIN LOW DOSE) 81 MG EC tablet Take 81 mg by mouth daily.     [provider]  calcium-vitamin D (OSCAL WITH D) 500-200 MG-UNIT TABS tablet TAKE (1) TABLET BY MOUTH (3) TIMES DAILY. 01/28/19   Fayrene Helper, MD  Chlorophyll 100 MG TABS Take 200 mg by mouth daily.    [provider]  Cholecalciferol (VITAMIN D) 50 MCG (2000 UT) CAPS Take 1 capsule (2,000 Units total) by mouth daily. 01/17/19   Fayrene Helper, MD  Magnesium Oxide 200 MG TABS Take 1 tablet (200 mg total) by mouth at bedtime. 05/30/19   Perlie Mayo, NP  Multiple Vitamin (MULTIVITAMIN) tablet Take 1 tablet by mouth daily.    [provider]  NIFEdipine (PROCARDIA XL/NIFEDICAL XL) 60 MG 24 hr tablet TAKE 1 TABLET BY MOUTH  DAILY 11/19/18   Fayrene Helper, MD  pravastatin (PRAVACHOL) 80 MG tablet TAKE 1 TABLET BY MOUTH  DAILY 11/19/18   Fayrene Helper, MD  SYNTHROID 25 MCG tablet  05/29/17   [provider]  triamterene-hydrochlorothiazide (MAXZIDE-25) 37.5-25 MG tablet TAKE 1 TABLET BY MOUTH  DAILY 11/19/18   Moshe Cipro,  Norwood Levo, MD    Family History Family History  Problem Relation Age of Onset  . Stroke Mother   . Diabetes Mother   . Hypertension Mother   . Coronary artery disease Father   . Arthritis Other   . Cancer Other   . Breast cancer Maternal Aunt     Social History Social History   Tobacco Use  . Smoking status: Never Smoker  . Smokeless tobacco: Never Used  Substance Use Topics  . Alcohol use: No    Alcohol/week: 0.0 standard drinks  . Drug use: No     Allergies   Patient has no known allergies.   Review of Systems Review of Systems  Constitutional: Negative.   HENT: Negative.   Respiratory: Negative.   Cardiovascular: Negative.     All other systems reviewed and are negative.    Physical Exam Triage Vital Signs ED Triage Vitals  Enc Vitals Group     BP 06/09/19 0848 (!) 158/95     Pulse Rate 06/09/19 0848 62     Resp 06/09/19 0848 18     Temp 06/09/19 0848 98 F (36.7 C)     Temp src --      SpO2 06/09/19 0848 96 %     Weight --      Height --      Head Circumference --      Peak Flow --      Pain Score 06/09/19 0847 0     Pain Loc --      Pain Edu? --      Excl. in [place]? --    No data found.  Updated Vital Signs BP (!) 158/95   Pulse 62   Temp 98 F (36.7 C)   Resp 18   SpO2 96%   Visual Acuity Right Eye Distance:   Left Eye Distance:   Bilateral Distance:    Right Eye Near:   Left Eye Near:    Bilateral Near:     Physical Exam Vitals and nursing note reviewed.  Constitutional:      General: She is not in acute distress.    Appearance: Normal appearance. She is normal weight. She is not ill-appearing or toxic-appearing.  Eyes:     General: Lids are normal. Vision grossly intact. Gaze aligned appropriately. No visual field deficit.       Right eye: No foreign body, discharge or hordeolum.        Left eye: No foreign body, discharge or hordeolum.     Conjunctiva/sclera: Conjunctivae normal.     Right eye: Right conjunctiva is not injected. No exudate.    Left eye: Left conjunctiva is not injected. No exudate.    Pupils: Pupils are equal, round, and reactive to light.  Cardiovascular:     Rate and Rhythm: Normal rate and regular rhythm.     Pulses: Normal pulses.     Heart sounds: Normal heart sounds. No murmur. No gallop.   Pulmonary:     Effort: Pulmonary effort is normal. No respiratory distress.     Breath sounds: Normal breath sounds. No stridor. No wheezing, rhonchi or rales.  Chest:     Chest wall: No tenderness.  Neurological:     Mental Status: She is alert.      UC Treatments / Results  Labs (all labs ordered are listed, but only abnormal results are  displayed) Labs Reviewed - No data to display  EKG   Radiology No results found.  Procedures Procedures (including critical care time)  Medications Ordered in UC Medications - No data to display  Initial Impression / Assessment and Plan / UC Course  I have reviewed the triage vital signs and the nursing notes.  Pertinent labs & imaging results that were available during my care of the patient were reviewed by me and considered in my medical decision making (see chart for details).     Patient is stable for discharge and does not have conjunctivitis. Symptom is more likely from allergy and is now resolved. Advised patient to return for new or worsening of symptoms Follow-up with primary care   Final Clinical Impressions(s) / UC Diagnoses   Final diagnoses:  Worried well     Discharge Instructions     Symptom is more likely from allergy symptoms Patient was advised to return for new or worsening symptoms. Reviewed expectations re: course of current medical issues. Questions answered. Outlined signs and symptoms indicating need for more acute intervention. Patient verbalized understanding. After Visit Summary given.     ED Prescriptions    None     PDMP not reviewed this encounter.   Emerson Monte, [place] 06/09/19 [phone]

## 2019-06-10 ENCOUNTER — Encounter: Payer: Medicare Other | Admitting: Family Medicine

## 2019-06-16 ENCOUNTER — Ambulatory Visit (INDEPENDENT_AMBULATORY_CARE_PROVIDER_SITE_OTHER): Payer: Medicare Other

## 2019-06-16 ENCOUNTER — Other Ambulatory Visit: Payer: Self-pay

## 2019-06-16 VITALS — BP 130/84 | Ht 65.0 in | Wt 175.0 lb

## 2019-06-16 DIAGNOSIS — Z0000 Encounter for general adult medical examination without abnormal findings: Secondary | ICD-10-CM

## 2019-06-16 NOTE — Progress Notes (Signed)
Subjective:   Christina Carroll is a 74 y.o. female who presents for Medicare Annual (Subsequent) preventive examination.  Review of Systems:   Cardiac Risk Factors include: advanced age (>42men, >24 women);dyslipidemia;hypertension;sedentary lifestyle     Objective:     Vitals: BP 130/84   Ht 5\' 5"  (1.651 m)   Wt 175 lb (79.4 kg)   BMI 29.12 kg/m   Body mass index is 29.12 kg/m.  Advanced Directives 06/16/2019 06/04/2017 01/01/2017 04/25/2016 12/23/2015 08/10/2015 11/30/2011  Does Patient Have a Medical Advance Directive? Yes Yes No Yes Yes No Patient has advance directive, copy not in chart  Type of Advance Directive Healthcare Power of [place] of [place]  Does patient want to make changes to medical advance directive? - - - Yes (MAU/Ambulatory/Procedural Areas - Information given) No - Patient declined - -  Copy of [place] in Chart? No - copy requested Yes - No - copy requested No - copy requested - -  Pre-existing out of facility DNR order (yellow form or pink MOST form) - - - - - - No    Tobacco Social History   Tobacco Use  Smoking Status Never Smoker  Smokeless Tobacco Never Used     Counseling given: Not Answered   Clinical Intake:  Pre-visit preparation completed: No  Pain : No/denies pain Pain Score: 0-No pain     Nutritional Status: BMI 25 -29 Overweight Diabetes: No  How often do you need to have someone help you when you read instructions, pamphlets, or other written materials from your doctor or pharmacy?: 1 - Never        Past Medical History:  Diagnosis Date  . Arthritis   . BACK PAIN 02/02/2010   Qualifier: Diagnosis of  By: Moshe Cipro MD, Joycelyn Schmid    . Essential hypertension   . Hyperlipidemia   . Lymphocytic thyroiditis   . NEVUS 05/12/2010   Qualifier: Diagnosis of  By: Moshe Cipro MD, Joycelyn Schmid    . Obesity   .  Prediabetes   . TENDINITIS 02/02/2010   Qualifier: Diagnosis of  By: Moshe Cipro MD, Joycelyn Schmid     Past Surgical History:  Procedure Laterality Date  . Bilateral foot surgery bone removed  2000  . COLONOSCOPY  11/30/2011   Procedure: COLONOSCOPY;  Surgeon: Rogene Houston, MD;  [facility];  Service: Endoscopy;  Laterality: N/A;  830  . Lumpectomy removed from thoracic spine area  1999   Family History  Problem Relation Age of Onset  . Stroke Mother   . Diabetes Mother   . Hypertension Mother   . Coronary artery disease Father   . Arthritis Other   . Cancer Other   . Breast cancer Maternal Aunt   . Hypertension Son    Social History   Socioeconomic History  . Marital status: Divorced    Spouse name: Not on file  . Number of children: 1  . Years of education: Not on file  . Highest education level: Not on file  Occupational History  . Occupation: Employed   Tobacco Use  . Smoking status: Never Smoker  . Smokeless tobacco: Never Used  Substance and Sexual Activity  . Alcohol use: No    Alcohol/week: 0.0 standard drinks  . Drug use: No  . Sexual activity: Not Currently  Other Topics Concern  . Not on file  Social History Narrative  . Not  on file   Social Determinants of Health   Financial Resource Strain:   . Difficulty of Paying Living Expenses:   Food Insecurity: No Food Insecurity  . Worried About Charity fundraiser in the Last Year: Never true  . Ran Out of Food in the Last Year: Never true  Transportation Needs: No Transportation Needs  . Lack of Transportation (Medical): No  . Lack of Transportation (Non-Medical): No  Physical Activity: Insufficiently Active  . Days of Exercise per Week: 3 days  . Minutes of Exercise per Session: 20 min  Stress:   . Feeling of Stress :   Social Connections: Slightly Isolated  . Frequency of Communication with Friends and Family: More than three times a week  . Frequency of Social Gatherings with Friends and  Family: Once a week  . Attends Religious Services: More than 4 times per year  . Active Member of Clubs or Organizations: Yes  . Attends Archivist Meetings: 1 to 4 times per year  . Marital Status: Divorced    Outpatient Encounter Medications as of 06/16/2019  Medication Sig  . aspirin (ASPIRIN LOW DOSE) 81 MG EC tablet Take 81 mg by mouth daily.   . calcium-vitamin D (OSCAL WITH D) 500-200 MG-UNIT TABS tablet TAKE (1) TABLET BY MOUTH (3) TIMES DAILY.  Marland Kitchen Chlorophyll 100 MG TABS Take 200 mg by mouth daily.  . Cholecalciferol (VITAMIN D) 50 MCG (2000 UT) CAPS Take 1 capsule (2,000 Units total) by mouth daily.  . Magnesium Oxide 200 MG TABS Take 1 tablet (200 mg total) by mouth at bedtime.  . Multiple Vitamin (MULTIVITAMIN) tablet Take 1 tablet by mouth daily.  Marland Kitchen NIFEdipine (PROCARDIA XL/NIFEDICAL XL) 60 MG 24 hr tablet TAKE 1 TABLET BY MOUTH  DAILY  . pravastatin (PRAVACHOL) 80 MG tablet TAKE 1 TABLET BY MOUTH  DAILY  . SYNTHROID 25 MCG tablet   . triamterene-hydrochlorothiazide (MAXZIDE-25) 37.5-25 MG tablet TAKE 1 TABLET BY MOUTH  DAILY   No facility-administered encounter medications on file as of 06/16/2019.    Activities of Daily Living In your present state of health, do you have any difficulty performing the following activities: 06/16/2019  Hearing? N  Vision? N  Difficulty concentrating or making decisions? N  Walking or climbing stairs? N  Dressing or bathing? N  Doing errands, shopping? N  Preparing Food and eating ? N  Using the Toilet? N  In the past six months, have you accidently leaked urine? N  Do you have problems with loss of bowel control? N  Managing your Medications? N  Managing your Finances? N  Housekeeping or managing your Housekeeping? N  Some recent data might be hidden    Patient Care Team: Fayrene Helper, MD as PCP - General Mor[place]ati, Lourdes Sledge, MD as Attending Physician (Endocrinology) Marybelle Killings, MD as Consulting Physician  (Orthopedic Surgery)    Assessment:   This is a routine wellness examination for [place].  Exercise Activities and Dietary recommendations Current Exercise Habits: The patient does not participate in regular exercise at present, Exercise limited by: None identified  Goals    . Exercise 3x per week (30 min per time)     Patient would like to start exercising 3 times a week for at least 30 minutes at a time.     Marland Kitchen LIFESTYLE - DECREASE FALLS RISK     Has fallen a lot in her driveway and had her driveway re done with concrete so its smoother  and less risk of falling        Fall Risk Fall Risk  05/30/2019 05/09/2019 11/11/2018 06/05/2018 03/19/2018  Falls in the past year? 0 0 1 1 1   Number falls in past yr: 0 0 0 0 0  Injury with Fall? 0 0 0 1 0  Risk for fall due to : - - - History of fall(s) -  Follow up - Falls evaluation completed;Education provided - Falls evaluation completed;Education provided;Falls prevention discussed -   Is the patient's home free of loose throw rugs in walkways, pet beds, electrical cords, etc?   yes      Grab bars in the bathroom? yes      Handrails on the stairs?   yes      Adequate lighting?   yes  Timed Get Up and Go performed: unable to do due to virtual visit   Depression Screen PHQ 2/9 Scores 06/16/2019 05/09/2019 11/11/2018 06/05/2018  PHQ - 2 Score 0 0 0 0  PHQ- 9 Score - - - 0     Cognitive Function     6CIT Screen 06/16/2019 06/05/2018 06/04/2017 04/25/2016  What Year? 0 points 0 points 0 points 0 points  What month? 0 points 0 points 0 points 0 points  What time? 0 points 0 points 0 points 0 points  Count back from 20 0 points 0 points 0 points 0 points  Months in reverse 0 points 0 points 0 points 0 points  Repeat phrase 0 points 0 points 0 points 0 points  Total Score 0 0 0 0    Immunization History  Administered Date(s) Administered  . Fluad Quad(high Dose 65+) 11/11/2018  . Influenza Split 02/21/2011, 01/31/2012  . Influenza Whole  01/23/2007, 12/21/2008, 02/02/2010  . Influenza, High Dose Seasonal PF 12/21/2017  . Influenza,inj,Quad PF,6+ Mos 12/24/2013, 01/18/2015, 12/01/2015, 01/18/2017  . Pneumococcal Conjugate-13 09/22/2013  . Pneumococcal Polysaccharide-23 04/03/2013  . Td 04/10/2005  . Tdap 06/29/2014  . Zoster 04/19/2009  . Zoster Recombinat (Shingrix) 06/05/2017, 09/06/2017    Qualifies for Shingles Vaccine? Completed series  Screening Tests Health Maintenance  Topic Date Due  . MAMMOGRAM  11/10/2020  . COLONOSCOPY  11/29/2021  . TETANUS/TDAP  06/28/2024  . INFLUENZA VACCINE  Completed  . DEXA SCAN  Completed  . Hepatitis C Screening  Completed  . PNA vac Low Risk Adult  Completed    Cancer Screenings: Lung: Low Dose CT Chest recommended if Age 39-80 years, 30 pack-year currently smoking OR have quit w/in 15years. Patient does not qualify. Breast:  Up to date on Mammogram? Yes   Up to date of Bone Density/Dexa? Yes Colorectal: Due in 2023  Additional Screenings:: Hepatitis C Screening: done      Plan:     I have personally reviewed and noted the following in the patient's chart:   . Medical and social history . Use of alcohol, tobacco or illicit drugs  . Current medications and supplements . Functional ability and status . Nutritional status . Physical activity . Advanced directives . List of other physicians . Hospitalizations, surgeries, and ER visits in previous 12 months . Vitals . Screenings to include cognitive, depression, and falls . Referrals and appointments  In addition, I have reviewed and discussed with patient certain preventive protocols, quality metrics, and best practice recommendations. A written personalized care plan for preventive services as well as general preventive health recommendations were provided to patient.     Kate Sable, LPN, LPN  [id number]

## 2019-06-16 NOTE — Patient Instructions (Signed)
Christina Carroll , Thank you for taking time to come for your Medicare Wellness Visit. I appreciate your ongoing commitment to your health goals. Please review the following plan we discussed and let me know if I can assist you in the future.   Screening recommendations/referrals: Colonoscopy: Due 2023 Mammogram: Up to date  Bone Density: up to date  Recommended yearly ophthalmology/optometry visit for glaucoma screening and checkup Recommended yearly dental visit for hygiene and checkup  Vaccinations: Influenza vaccine: up to date  Pneumococcal vaccine: up to date  Tdap vaccine: up to date  Shingles vaccine: series completed   Advanced directives: completed  Conditions/risks identified: fall risk   Next appointment: 1 year    Preventive Care 21 Years and Older, Female Preventive care refers to lifestyle choices and visits with your health care provider that can promote health and wellness. What does preventive care include?  A yearly physical exam. This is also called an annual well check.  Dental exams once or twice a year.  Routine eye exams. Ask your health care provider how often you should have your eyes checked.  Personal lifestyle choices, including:  Daily care of your teeth and gums.  Regular physical activity.  Eating a healthy diet.  Avoiding tobacco and drug use.  Limiting alcohol use.  Practicing safe sex.  Taking low-dose aspirin every day.  Taking vitamin and mineral supplements as recommended by your health care provider. What happens during an annual well check? The services and screenings done by your health care provider during your annual well check will depend on your age, overall health, lifestyle risk factors, and family history of disease. Counseling  Your health care provider may ask you questions about your:  Alcohol use.  Tobacco use.  Drug use.  Emotional well-being.  Home and relationship well-being.  Sexual  activity.  Eating habits.  History of falls.  Memory and ability to understand (cognition).  Work and work Statistician.  Reproductive health. Screening  You may have the following tests or measurements:  Height, weight, and BMI.  Blood pressure.  Lipid and cholesterol levels. These may be checked every 5 years, or more frequently if you are over 30 years old.  Skin check.  Lung cancer screening. You may have this screening every year starting at age 46 if you have a 30-pack-year history of smoking and currently smoke or have quit within the past 15 years.  Fecal occult blood test (FOBT) of the stool. You may have this test every year starting at age 71.  Flexible sigmoidoscopy or colonoscopy. You may have a sigmoidoscopy every 5 years or a colonoscopy every 10 years starting at age 12.  Hepatitis C blood test.  Hepatitis B blood test.  Sexually transmitted disease (STD) testing.  Diabetes screening. This is done by checking your blood sugar (glucose) after you have not eaten for a while (fasting). You may have this done every 1-3 years.  Bone density scan. This is done to screen for osteoporosis. You may have this done starting at age 16.  Mammogram. This may be done every 1-2 years. Talk to your health care provider about how often you should have regular mammograms. Talk with your health care provider about your test results, treatment options, and if necessary, the need for more tests. Vaccines  Your health care provider may recommend certain vaccines, such as:  Influenza vaccine. This is recommended every year.  Tetanus, diphtheria, and acellular pertussis (Tdap, Td) vaccine. You may need a Td booster  every 10 years.  Zoster vaccine. You may need this after age 23.  Pneumococcal 13-valent conjugate (PCV13) vaccine. One dose is recommended after age 33.  Pneumococcal polysaccharide (PPSV23) vaccine. One dose is recommended after age 39. Talk to your health care  provider about which screenings and vaccines you need and how often you need them. This information is not intended to replace advice given to you by your health care provider. Make sure you discuss any questions you have with your health care provider. Document Released: 04/02/2015 Document Revised: 11/24/2015 Document Reviewed: 01/05/2015 Elsevier Interactive Patient Education  2017 [employer] Prevention in the Home Falls can cause injuries. They can happen to people of all ages. There are many things you can do to make your home safe and to help prevent falls. What can I do on the outside of my home?  Regularly fix the edges of walkways and driveways and fix any cracks.  Remove anything that might make you trip as you walk through a door, such as a raised step or threshold.  Trim any bushes or trees on the path to your home.  Use bright outdoor lighting.  Clear any walking paths of anything that might make someone trip, such as rocks or tools.  Regularly check to see if handrails are loose or broken. Make sure that both sides of any steps have handrails.  Any raised decks and porches should have guardrails on the edges.  Have any leaves, snow, or ice cleared regularly.  Use sand or salt on walking paths during winter.  Clean up any spills in your garage right away. This includes oil or grease spills. What can I do in the bathroom?  Use night lights.  Install grab bars by the toilet and in the tub and shower. Do not use towel bars as grab bars.  Use non-skid mats or decals in the tub or shower.  If you need to sit down in the shower, use a plastic, non-slip stool.  Keep the floor dry. Clean up any water that spills on the floor as soon as it happens.  Remove soap buildup in the tub or shower regularly.  Attach bath mats securely with double-sided non-slip rug tape.  Do not have throw rugs and other things on the floor that can make you trip. What can I do in  the bedroom?  Use night lights.  Make sure that you have a light by your bed that is easy to reach.  Do not use any sheets or blankets that are too big for your bed. They should not hang down onto the floor.  Have a firm chair that has side arms. You can use this for support while you get dressed.  Do not have throw rugs and other things on the floor that can make you trip. What can I do in the kitchen?  Clean up any spills right away.  Avoid walking on wet floors.  Keep items that you use a lot in easy-to-reach places.  If you need to reach something above you, use a strong step stool that has a grab bar.  Keep electrical cords out of the way.  Do not use floor polish or wax that makes floors slippery. If you must use wax, use non-skid floor wax.  Do not have throw rugs and other things on the floor that can make you trip. What can I do with my stairs?  Do not leave any items on the stairs.  Make  sure that there are handrails on both sides of the stairs and use them. Fix handrails that are broken or loose. Make sure that handrails are as long as the stairways.  Check any carpeting to make sure that it is firmly attached to the stairs. Fix any carpet that is loose or worn.  Avoid having throw rugs at the top or bottom of the stairs. If you do have throw rugs, attach them to the floor with carpet tape.  Make sure that you have a light switch at the top of the stairs and the bottom of the stairs. If you do not have them, ask someone to add them for you. What else can I do to help prevent falls?  Wear shoes that:  Do not have high heels.  Have rubber bottoms.  Are comfortable and fit you well.  Are closed at the toe. Do not wear sandals.  If you use a stepladder:  Make sure that it is fully opened. Do not climb a closed stepladder.  Make sure that both sides of the stepladder are locked into place.  Ask someone to hold it for you, if possible.  Clearly mark and  make sure that you can see:  Any grab bars or handrails.  First and last steps.  Where the edge of each step is.  Use tools that help you move around (mobility aids) if they are needed. These include:  Canes.  Walkers.  Scooters.  Crutches.  Turn on the lights when you go into a dark area. Replace any light bulbs as soon as they burn out.  Set up your furniture so you have a clear path. Avoid moving your furniture around.  If any of your floors are uneven, fix them.  If there are any pets around you, be aware of where they are.  Review your medicines with your doctor. Some medicines can make you feel dizzy. This can increase your chance of falling. Ask your doctor what other things that you can do to help prevent falls. This information is not intended to replace advice given to you by your health care provider. Make sure you discuss any questions you have with your health care provider. Document Released: 12/31/2008 Document Revised: 08/12/2015 Document Reviewed: 04/10/2014 Elsevier Interactive Patient Education  2017 [employer].

## 2019-06-26 DIAGNOSIS — E669 Obesity, unspecified: Secondary | ICD-10-CM | POA: Diagnosis not present

## 2019-06-26 DIAGNOSIS — E049 Nontoxic goiter, unspecified: Secondary | ICD-10-CM | POA: Diagnosis not present

## 2019-06-26 DIAGNOSIS — E785 Hyperlipidemia, unspecified: Secondary | ICD-10-CM | POA: Diagnosis not present

## 2019-06-26 DIAGNOSIS — I10 Essential (primary) hypertension: Secondary | ICD-10-CM | POA: Diagnosis not present

## 2019-06-26 DIAGNOSIS — E063 Autoimmune thyroiditis: Secondary | ICD-10-CM | POA: Diagnosis not present

## 2019-07-01 ENCOUNTER — Encounter: Payer: Self-pay | Admitting: Family Medicine

## 2019-07-03 DIAGNOSIS — I10 Essential (primary) hypertension: Secondary | ICD-10-CM | POA: Diagnosis not present

## 2019-07-03 DIAGNOSIS — E669 Obesity, unspecified: Secondary | ICD-10-CM | POA: Diagnosis not present

## 2019-07-03 DIAGNOSIS — E063 Autoimmune thyroiditis: Secondary | ICD-10-CM | POA: Diagnosis not present

## 2019-07-03 DIAGNOSIS — E039 Hypothyroidism, unspecified: Secondary | ICD-10-CM | POA: Diagnosis not present

## 2019-07-03 DIAGNOSIS — Z6831 Body mass index (BMI) 31.0-31.9, adult: Secondary | ICD-10-CM | POA: Diagnosis not present

## 2019-07-03 DIAGNOSIS — E785 Hyperlipidemia, unspecified: Secondary | ICD-10-CM | POA: Diagnosis not present

## 2019-07-03 DIAGNOSIS — E049 Nontoxic goiter, unspecified: Secondary | ICD-10-CM | POA: Diagnosis not present

## 2019-09-23 ENCOUNTER — Encounter: Payer: Self-pay | Admitting: Family Medicine

## 2019-09-24 ENCOUNTER — Other Ambulatory Visit: Payer: Self-pay | Admitting: *Deleted

## 2019-09-24 ENCOUNTER — Other Ambulatory Visit: Payer: Self-pay

## 2019-09-24 ENCOUNTER — Encounter: Payer: Self-pay | Admitting: Family Medicine

## 2019-09-24 ENCOUNTER — Ambulatory Visit (INDEPENDENT_AMBULATORY_CARE_PROVIDER_SITE_OTHER): Payer: Medicare Other | Admitting: Family Medicine

## 2019-09-24 ENCOUNTER — Ambulatory Visit (HOSPITAL_COMMUNITY)
Admission: RE | Admit: 2019-09-24 | Discharge: 2019-09-24 | Disposition: A | Discharge status: Home / Self Care | Payer: Medicare Other | Source: Ambulatory Visit | Attending: Family Medicine | Admitting: Family Medicine

## 2019-09-24 VITALS — BP 161/86 | HR 89 | Temp 97.6°F | Resp 18 | Ht 65.0 in | Wt 177.0 lb

## 2019-09-24 DIAGNOSIS — S79911A Unspecified injury of right hip, initial encounter: Secondary | ICD-10-CM | POA: Diagnosis not present

## 2019-09-24 DIAGNOSIS — M1611 Unilateral primary osteoarthritis, right hip: Secondary | ICD-10-CM | POA: Diagnosis not present

## 2019-09-24 DIAGNOSIS — W19XXXA Unspecified fall, initial encounter: Secondary | ICD-10-CM | POA: Diagnosis not present

## 2019-09-24 DIAGNOSIS — M25551 Pain in right hip: Secondary | ICD-10-CM | POA: Diagnosis not present

## 2019-09-24 DIAGNOSIS — M25522 Pain in left elbow: Secondary | ICD-10-CM

## 2019-09-24 DIAGNOSIS — M160 Bilateral primary osteoarthritis of hip: Secondary | ICD-10-CM | POA: Diagnosis not present

## 2019-09-24 NOTE — Patient Instructions (Signed)
I appreciate the opportunity to provide you with care for your health and wellness. Today we discussed: Fall injury to Right hip and Left Elbow  Follow up: 4 months  Orders: Right Hip Xray No labs or referrals today  Xray today at [facility] we will call with results.  Please continue to practice social distancing to keep you, your family, and our community safe.  If you must go out, please wear a mask and practice good handwashing.  It was a pleasure to see you and I look forward to continuing to work together on your health and well-being. Please do not hesitate to call the office if you need care or have questions about your care.  Have a wonderful day and week. With Gratitude, Cherly Beach, DNP, AGNP-BC

## 2019-09-24 NOTE — Progress Notes (Signed)
Subjective:  Patient ID: Christina Carroll, female    DOB: 02/07/1946  Age: 74 y.o. MRN: [id number]  CC:  Chief Complaint  Patient presents with  . Fall      HPI  HPI Christina Carroll is a 74 year old female patient of Dr. Griffin Dakin.  She presents today after sustaining a fall in her yard. She hit hard on her left elbow.  She then stood up felt she had her balance but then fell back straight onto her right hip and hurt it.  She reports that she rubbed green alcohol on her abrasions of the left elbow they are starting to heal and feel better. She also rubbed it on her right hip as well. However, Saturday she started getting pain on the lateral side of her hip. She has pain with pressure to the hip, walking, and laying. She tried Voltaren gel as well.  Denies numbness and tingling. Did not go to UC or ED post fall.   Today patient denies signs and symptoms of COVID 19 infection including fever, chills, cough, shortness of breath, and headache. Past Medical, Surgical, Social History, Allergies, and Medications have been Reviewed.   Past Medical History:  Diagnosis Date  . Arthritis   . BACK PAIN 02/02/2010   Qualifier: Diagnosis of  By: Moshe Cipro MD, Joycelyn Schmid    . Essential hypertension   . Hyperlipidemia   . Lymphocytic thyroiditis   . NEVUS 05/12/2010   Qualifier: Diagnosis of  By: Moshe Cipro MD, Joycelyn Schmid    . Obesity   . Prediabetes   . TENDINITIS 02/02/2010   Qualifier: Diagnosis of  By: Moshe Cipro MD, Joycelyn Schmid      Current Meds  Medication Sig  . aspirin (ASPIRIN LOW DOSE) 81 MG EC tablet Take 81 mg by mouth daily.   . calcium-vitamin D (OSCAL WITH D) 500-200 MG-UNIT TABS tablet TAKE (1) TABLET BY MOUTH (3) TIMES DAILY.  Marland Kitchen Chlorophyll 100 MG TABS Take 200 mg by mouth daily.  . Cholecalciferol (VITAMIN D) 50 MCG (2000 UT) CAPS Take 1 capsule (2,000 Units total) by mouth daily.  . Magnesium Oxide 200 MG TABS Take 1 tablet (200 mg total) by mouth at bedtime.  . Multiple  Vitamin (MULTIVITAMIN) tablet Take 1 tablet by mouth daily.  Marland Kitchen NIFEdipine (PROCARDIA XL/NIFEDICAL XL) 60 MG 24 hr tablet TAKE 1 TABLET BY MOUTH  DAILY  . pravastatin (PRAVACHOL) 80 MG tablet TAKE 1 TABLET BY MOUTH  DAILY  . SYNTHROID 25 MCG tablet   . triamterene-hydrochlorothiazide (MAXZIDE-25) 37.5-25 MG tablet TAKE 1 TABLET BY MOUTH  DAILY    ROS:  Review of Systems  Constitutional: Negative.   HENT: Negative.   Eyes: Negative.   Respiratory: Negative.   Cardiovascular: Negative.   Gastrointestinal: Negative.   Genitourinary: Negative.   Musculoskeletal: Positive for falls and joint pain.  Skin: Negative.   Neurological: Negative.   Endo/Heme/Allergies: Negative.   Psychiatric/Behavioral: Negative.   All other systems reviewed and are negative.    Objective:   Today's Vitals: BP (!) 161/86 (BP Location: Right Arm, Patient Position: Sitting, Cuff Size: Normal)   Pulse 89   Temp 97.6 F (36.4 C) (Temporal)   Resp 18   Ht 5\' 5"  (1.651 m)   Wt 177 lb (80.3 kg)   SpO2 97%   BMI 29.45 kg/m  Vitals with BMI 09/24/2019 06/16/2019 06/09/2019  Height 5\' 5"  5\' 5"  -  Weight 177 lbs 175 lbs -  BMI 65.53 74.82 -  Systolic 707 867  676  Diastolic 86 84 95  Pulse 89 - 62     Physical Exam Vitals and nursing note reviewed.  Constitutional:      Appearance: Normal appearance. She is obese.  HENT:     Head: Normocephalic and atraumatic.     Right Ear: External ear normal.     Left Ear: External ear normal.     Mouth/Throat:     Comments: Mask in place  Eyes:     General:        Right eye: No discharge.        Left eye: No discharge.     Conjunctiva/sclera: Conjunctivae normal.  Cardiovascular:     Rate and Rhythm: Normal rate and regular rhythm.     Pulses: Normal pulses.     Heart sounds: Normal heart sounds.  Pulmonary:     Effort: Pulmonary effort is normal.     Breath sounds: Normal breath sounds.  Musculoskeletal:     Cervical back: Normal range of motion and neck  supple.     Right hip: Tenderness and bony tenderness present. Decreased range of motion.     Left hip: Normal.  Skin:    General: Skin is warm.  Neurological:     General: No focal deficit present.     Mental Status: She is alert and oriented to person, place, and time.  Psychiatric:        Mood and Affect: Mood normal.        Behavior: Behavior normal.        Thought Content: Thought content normal.        Judgment: Judgment normal.     Assessment   1. Fall, initial encounter     Tests ordered Orders Placed This Encounter  Procedures  . DG Arthro Hip Right     Plan: Please see assessment and plan per problem list above.   No orders of the defined types were placed in this encounter.   Patient to follow-up in 4 months   Perlie Mayo, NP

## 2019-09-24 NOTE — Assessment & Plan Note (Signed)
Fall from standing height in yard. Hit Left Elbow and Right Hip R hip pain is present, xray is ordered Post xray medication and tx will be ordered.

## 2019-09-25 ENCOUNTER — Ambulatory Visit: Payer: Medicare Other | Admitting: Family Medicine

## 2019-10-23 ENCOUNTER — Encounter: Payer: Self-pay | Admitting: Family Medicine

## 2019-10-23 ENCOUNTER — Other Ambulatory Visit: Payer: Self-pay

## 2019-10-23 ENCOUNTER — Ambulatory Visit (INDEPENDENT_AMBULATORY_CARE_PROVIDER_SITE_OTHER): Payer: Medicare Other | Admitting: Family Medicine

## 2019-10-23 VITALS — BP 142/81 | HR 90 | Temp 97.5°F | Resp 16 | Ht 65.0 in | Wt 175.1 lb

## 2019-10-23 DIAGNOSIS — M1611 Unilateral primary osteoarthritis, right hip: Secondary | ICD-10-CM

## 2019-10-23 DIAGNOSIS — M5431 Sciatica, right side: Secondary | ICD-10-CM | POA: Diagnosis not present

## 2019-10-23 MED ORDER — PREDNISONE 10 MG (21) PO TBPK: ORAL_TABLET | ORAL | 0 refills | Status: DC

## 2019-10-23 MED ORDER — GABAPENTIN 100 MG PO CAPS: 100.0000 mg | ORAL_CAPSULE | Freq: Every day | ORAL | 0 refills | Status: DC

## 2019-10-23 NOTE — Progress Notes (Signed)
Subjective:  Patient ID: Christina Carroll, female    DOB: Aug 04, 1945  Age: 74 y.o. MRN: [id number]  CC:  Chief Complaint  Patient presents with  . Hip Pain      HPI  HPI Christina Carroll is a 74 year old female patient of Dr. Griffin Dakin who presents today with hip pain. She reports that she just drove to [place] to watch her granddaughter graduate pre-k.  It is a 2-1/2 half hour rough drive from here.  She reports that she started having discomfort and pain when she got there with her right hip.  She stayed there overnight to help the ease up before she drove back.  Reports that it got worse on the drive back.  She has been using Voltaren gel.  And she says that it helps some.  She has not had any injury or trauma to the area.  She does have some tenderness from [place] to thigh area lateral right side.  She is never been diagnosed with sciatica.  Today patient denies signs and symptoms of COVID 19 infection including fever, chills, cough, shortness of breath, and headache. Past Medical, Surgical, Social History, Allergies, and Medications have been Reviewed.   Past Medical History:  Diagnosis Date  . Arthritis   . BACK PAIN 02/02/2010   Qualifier: Diagnosis of  By: Moshe Cipro MD, Joycelyn Schmid    . Essential hypertension   . Hyperlipidemia   . Lymphocytic thyroiditis   . NEVUS 05/12/2010   Qualifier: Diagnosis of  By: Moshe Cipro MD, Joycelyn Schmid    . Obesity   . Prediabetes   . TENDINITIS 02/02/2010   Qualifier: Diagnosis of  By: Moshe Cipro MD, Joycelyn Schmid      Current Meds  Medication Sig  . aspirin (ASPIRIN LOW DOSE) 81 MG EC tablet Take 81 mg by mouth daily.   . calcium-vitamin D (OSCAL WITH D) 500-200 MG-UNIT TABS tablet TAKE (1) TABLET BY MOUTH (3) TIMES DAILY.  Marland Kitchen Chlorophyll 100 MG TABS Take 200 mg by mouth daily.  . Cholecalciferol (VITAMIN D) 50 MCG (2000 UT) CAPS Take 1 capsule (2,000 Units total) by mouth daily.  . Magnesium Oxide 200 MG TABS Take 1 tablet  (200 mg total) by mouth at bedtime.  . Multiple Vitamin (MULTIVITAMIN) tablet Take 1 tablet by mouth daily.  Marland Kitchen NIFEdipine (PROCARDIA XL/NIFEDICAL XL) 60 MG 24 hr tablet TAKE 1 TABLET BY MOUTH  DAILY  . pravastatin (PRAVACHOL) 80 MG tablet TAKE 1 TABLET BY MOUTH  DAILY  . SYNTHROID 25 MCG tablet   . triamterene-hydrochlorothiazide (MAXZIDE-25) 37.5-25 MG tablet TAKE 1 TABLET BY MOUTH  DAILY    ROS:  Review of Systems  Constitutional: Negative.   HENT: Negative.   Eyes: Negative.   Respiratory: Negative.   Cardiovascular: Negative.   Gastrointestinal: Negative.   Genitourinary: Negative.   Musculoskeletal: Positive for joint pain.  Skin: Negative.   Neurological: Negative.   Endo/Heme/Allergies: Negative.   Psychiatric/Behavioral: Negative.   All other systems reviewed and are negative.    Objective:   Today's Vitals: BP (!) 142/81 (BP Location: Right Arm, Patient Position: Sitting, Cuff Size: Normal)   Pulse 90   Temp (!) 97.5 F (36.4 C) (Temporal)   Resp 16   Ht 5\' 5"  (1.651 m)   Wt 175 lb 1.9 oz (79.4 kg)   SpO2 94%   BMI 29.14 kg/m  Vitals with BMI 10/23/2019 09/24/2019 06/16/2019  Height 5\' 5"  5\' 5"  5\' 5"   Weight 175 lbs 2 oz 177 lbs  175 lbs  BMI 29.14 82.50 03.70  Systolic 488 891 694  Diastolic 81 86 84  Pulse 90 89 -     Physical Exam Vitals and nursing note reviewed.  Constitutional:      Appearance: Normal appearance. She is obese.  HENT:     Head: Normocephalic and atraumatic.     Right Ear: External ear normal.     Left Ear: External ear normal.     Mouth/Throat:     Comments: Mask in place Eyes:     General:        Right eye: No discharge.        Left eye: No discharge.     Conjunctiva/sclera: Conjunctivae normal.  Cardiovascular:     Rate and Rhythm: Normal rate and regular rhythm.     Pulses: Normal pulses.     Heart sounds: Normal heart sounds.  Pulmonary:     Effort: Pulmonary effort is normal.     Breath sounds: Normal breath sounds.    Musculoskeletal:     Cervical back: Normal range of motion and neck supple.     Comments: Tenderness over hip, buttock and down outer thigh  Skin:    General: Skin is warm.  Neurological:     General: No focal deficit present.     Mental Status: She is alert and oriented to person, place, and time.  Psychiatric:        Mood and Affect: Mood normal.        Behavior: Behavior normal.        Thought Content: Thought content normal.        Judgment: Judgment normal.      Assessment   1. Sciatica of right side   2. Osteoarthritis of right hip, unspecified osteoarthritis type     Tests ordered No orders of the defined types were placed in this encounter.    Plan: Please see assessment and plan per problem list above.   Meds ordered this encounter  Medications  . predniSONE (STERAPRED UNI-PAK 21 TAB) 10 MG (21) TBPK tablet    Sig: Take as directed    Dispense:  21 tablet    Refill:  0    Order Specific Question:   Supervising Provider    Answer:   SIMPSON, MARGARET E [[id number]]  . gabapentin (NEURONTIN) 100 MG capsule    Sig: Take 1 capsule (100 mg total) by mouth daily for 15 days.    Dispense:  15 capsule    Refill:  0    Order Specific Question:   Supervising Provider    Answer:   Fayrene Helper [[id number]]    Patient to follow-up in as scheduled .  Perlie Mayo, NP

## 2019-10-23 NOTE — Patient Instructions (Addendum)
I appreciate the opportunity to provide you with care for your health and wellness. Today we discussed: sciatica, ankle swelling  Follow up: 01/27/2020 as scheduled  No labs or referrals today  Please take medications as directed  Compression socks- 5-10 or 10-15 mmHg   Avoid bending knees, sitting for long periods, and laying on Right side.  Happy you got to see your grandbaby graduate pre-k :)  Please continue to practice social distancing to keep you, your family, and our community safe.  If you must go out, please wear a mask and practice good handwashing.  It was a pleasure to see you and I look forward to continuing to work together on your health and well-being. Please do not hesitate to call the office if you need care or have questions about your care.  Have a wonderful day and week. With Gratitude, Cherly Beach, DNP, AGNP-BC

## 2019-10-24 ENCOUNTER — Encounter: Payer: Self-pay | Admitting: Family Medicine

## 2019-10-24 DIAGNOSIS — M5431 Sciatica, right side: Secondary | ICD-10-CM | POA: Insufficient documentation

## 2019-10-24 DIAGNOSIS — M1611 Unilateral primary osteoarthritis, right hip: Secondary | ICD-10-CM | POA: Insufficient documentation

## 2019-10-24 NOTE — Assessment & Plan Note (Signed)
She was noted to have osteoarthritis on her last x-ray that I ordered for her earlier in the month of July.  She continues to have this discomfort.  And it seems to be flared up secondary to the sciatica that she is experiencing I encouraged her to continue the use of Voltaren.  We will also do short course of prednisone.

## 2019-10-24 NOTE — Assessment & Plan Note (Signed)
Sciatica treatment measures provided.  Short course of gabapentin provided so that she can sleep well.  And do exercises as recommended.

## 2019-12-22 ENCOUNTER — Other Ambulatory Visit: Payer: Medicare Other

## 2019-12-29 ENCOUNTER — Other Ambulatory Visit (HOSPITAL_COMMUNITY): Payer: Self-pay | Admitting: Family Medicine

## 2019-12-29 DIAGNOSIS — Z1231 Encounter for screening mammogram for malignant neoplasm of breast: Secondary | ICD-10-CM

## 2020-01-05 ENCOUNTER — Ambulatory Visit (INDEPENDENT_AMBULATORY_CARE_PROVIDER_SITE_OTHER): Payer: Medicare Other

## 2020-01-05 ENCOUNTER — Other Ambulatory Visit: Payer: Self-pay

## 2020-01-05 DIAGNOSIS — Z23 Encounter for immunization: Secondary | ICD-10-CM

## 2020-01-07 ENCOUNTER — Ambulatory Visit (HOSPITAL_COMMUNITY)
Admission: RE | Admit: 2020-01-07 | Discharge: 2020-01-07 | Disposition: A | Discharge status: Home / Self Care | Payer: Medicare Other | Source: Ambulatory Visit | Attending: Family Medicine | Admitting: Family Medicine

## 2020-01-07 ENCOUNTER — Other Ambulatory Visit: Payer: Self-pay

## 2020-01-07 DIAGNOSIS — Z1231 Encounter for screening mammogram for malignant neoplasm of breast: Secondary | ICD-10-CM | POA: Diagnosis not present

## 2020-01-09 ENCOUNTER — Other Ambulatory Visit: Payer: Self-pay | Admitting: Family Medicine

## 2020-01-12 ENCOUNTER — Encounter: Payer: Self-pay | Admitting: Family Medicine

## 2020-01-12 ENCOUNTER — Other Ambulatory Visit (HOSPITAL_COMMUNITY): Payer: Self-pay | Admitting: Family Medicine

## 2020-01-12 DIAGNOSIS — R928 Other abnormal and inconclusive findings on diagnostic imaging of breast: Secondary | ICD-10-CM

## 2020-01-20 ENCOUNTER — Other Ambulatory Visit: Payer: Self-pay

## 2020-01-20 ENCOUNTER — Ambulatory Visit (HOSPITAL_COMMUNITY)
Admission: RE | Admit: 2020-01-20 | Discharge: 2020-01-20 | Disposition: A | Discharge status: Home / Self Care | Payer: Medicare Other | Source: Ambulatory Visit | Attending: Family Medicine | Admitting: Family Medicine

## 2020-01-20 DIAGNOSIS — R928 Other abnormal and inconclusive findings on diagnostic imaging of breast: Secondary | ICD-10-CM | POA: Diagnosis not present

## 2020-01-20 DIAGNOSIS — R921 Mammographic calcification found on diagnostic imaging of breast: Secondary | ICD-10-CM | POA: Diagnosis not present

## 2020-01-23 ENCOUNTER — Encounter: Payer: Self-pay | Admitting: Family Medicine

## 2020-01-24 DIAGNOSIS — Z23 Encounter for immunization: Secondary | ICD-10-CM | POA: Diagnosis not present

## 2020-01-26 NOTE — Telephone Encounter (Signed)
Pt has a follow up with Jarrett Soho 01-26-20 and would like to keep this appointment. Advised to call radiology dept today so that if they are unable to set her up we can help with this at the appointment.

## 2020-01-27 ENCOUNTER — Encounter: Payer: Self-pay | Admitting: Family Medicine

## 2020-01-27 ENCOUNTER — Other Ambulatory Visit: Payer: Self-pay | Admitting: Family Medicine

## 2020-01-27 ENCOUNTER — Ambulatory Visit (INDEPENDENT_AMBULATORY_CARE_PROVIDER_SITE_OTHER): Payer: Medicare Other | Admitting: Family Medicine

## 2020-01-27 ENCOUNTER — Other Ambulatory Visit: Payer: Self-pay

## 2020-01-27 VITALS — BP 144/84 | HR 97 | Temp 97.8°F | Ht 65.0 in | Wt 168.0 lb

## 2020-01-27 DIAGNOSIS — R7303 Prediabetes: Secondary | ICD-10-CM

## 2020-01-27 DIAGNOSIS — R921 Mammographic calcification found on diagnostic imaging of breast: Secondary | ICD-10-CM

## 2020-01-27 DIAGNOSIS — E785 Hyperlipidemia, unspecified: Secondary | ICD-10-CM

## 2020-01-27 DIAGNOSIS — H6123 Impacted cerumen, bilateral: Secondary | ICD-10-CM | POA: Diagnosis not present

## 2020-01-27 DIAGNOSIS — I10 Essential (primary) hypertension: Secondary | ICD-10-CM

## 2020-01-27 NOTE — Progress Notes (Signed)
Subjective:  Patient ID: Christina Carroll, female    DOB: 1946-01-04  Age: 74 y.o. MRN: [id number]  CC:  Chief Complaint  Patient presents with  . Follow-up      HPI  HPI   Christina Carroll is a 74 year old female patient Dr. Griffin Dakin.  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  She has not heard anything to would like the number to the office to that she can call and get the appointment set up.  Additionally she reports that she needs to be seen by Dr. the ENT secondary to having ongoing impaction in her ear which she does not feel that we could resolve here in the office.  She reports that she does not have his number anymore.  She denies having any changes in her sleep.  She denies any changes in her chewing or swallowing.  She denies having any changes in bowel or bladder habits.  She denies having any memory issues.  She denies having any falls or injuries.  Reports that her back and feet have felt much better secondary to starting wearing new shoes.  She denies having any skin, hearing or vision changes.  She has lost 7 pounds since her last visit.  She reports that she has been more active secondary to getting these new shoes.  She does report a little bit of this comfort in her lower right leg.  Denies having any sensation, pulse, coloration, swelling.  Today patient denies signs and symptoms of COVID 19 infection including fever, chills, cough, shortness of breath, and headache. Past Medical, Surgical, Social History, Allergies, and Medications have been Reviewed.   Past Medical History:  Diagnosis Date  . Arthritis   . BACK PAIN 02/02/2010   Qualifier: Diagnosis of  By: Moshe Cipro MD, Joycelyn Schmid    . Essential hypertension   . Hyperlipidemia   . Lymphocytic thyroiditis   . Metabolic syndrome X [id number]/82/9562  . NEVUS 05/12/2010   Qualifier: Diagnosis of  By: Moshe Cipro MD, Joycelyn Schmid    .  Obesity   . Prediabetes   . Primary osteoarthritis, 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  . TENDINITIS 02/02/2010   Qualifier: Diagnosis of  By: Moshe Cipro MD, Joycelyn Schmid      Current Meds  Medication Sig  . aspirin (ASPIRIN LOW DOSE) 81 MG EC tablet Take 81 mg by mouth daily.   . calcium-vitamin D (OSCAL WITH D) 500-200 MG-UNIT TABS tablet TAKE (1) TABLET BY MOUTH (3) TIMES DAILY.  Marland Kitchen Chlorophyll 100 MG TABS Take 200 mg by mouth daily.  . Cholecalciferol (VITAMIN D) 50 MCG (2000 UT) CAPS Take 1 capsule (2,000 Units total) by mouth daily.  . Magnesium Oxide 200 MG TABS Take 1 tablet (200 mg total) by mouth at bedtime.  . Multiple Vitamin (MULTIVITAMIN) tablet Take 1 tablet by mouth daily.  Marland Kitchen NIFEdipine (PROCARDIA XL/NIFEDICAL XL) 60 MG 24 hr tablet TAKE 1 TABLET BY MOUTH  DAILY  . pravastatin (PRAVACHOL) 80 MG tablet TAKE 1 TABLET BY MOUTH  DAILY  . SYNTHROID 25 MCG tablet   . triamterene-hydrochlorothiazide (MAXZIDE-25) 37.5-25 MG tablet TAKE 1 TABLET BY MOUTH  DAILY  . [DISCONTINUED] predniSONE (STERAPRED UNI-PAK 21 TAB) 10 MG (21) TBPK tablet Take as directed    ROS:  Review of Systems  Constitutional: Negative.   HENT: Positive for hearing loss.   Eyes: Negative.   Respiratory: Negative.   Cardiovascular:  Negative.   Gastrointestinal: Negative.   Genitourinary: Negative.   Musculoskeletal: Negative.   Skin: Negative.   Neurological: Negative.   Endo/Heme/Allergies: Negative.   Psychiatric/Behavioral: Negative.      Objective:   Today's Vitals: BP (!) 144/84 (BP Location: Right Arm, Patient Position: Sitting, Cuff Size: Normal)   Pulse 97   Temp 97.8 F (36.6 C) (Tympanic)   Ht 5\' 5"  (1.651 m)   Wt 168 lb (76.2 kg)   SpO2 97%   BMI 27.96 kg/m  Vitals with BMI 01/27/2020 10/23/2019 09/24/2019  Height 5\' 5"  5\' 5"  5\' 5"   Weight 168 lbs 175 lbs 2 oz 177 lbs  BMI 27.96 18.84 16.60  Systolic 630 160 109  Diastolic 84 81 86  Pulse 97 90 89     Physical Exam Vitals and nursing note  reviewed.  Constitutional:      Appearance: Normal appearance. She is well-developed, well-groomed and overweight.  HENT:     Head: Normocephalic and atraumatic.     Right Ear: External ear normal. Decreased hearing noted. There is impacted cerumen.     Left Ear: External ear normal. Decreased hearing noted.     Mouth/Throat:     Comments: Mask in place  Eyes:     General:        Right eye: No discharge.        Left eye: No discharge.     Conjunctiva/sclera: Conjunctivae normal.  Cardiovascular:     Rate and Rhythm: Normal rate and regular rhythm.     Pulses: Normal pulses.     Heart sounds: Normal heart sounds.  Pulmonary:     Effort: Pulmonary effort is normal.     Breath sounds: Normal breath sounds.  Musculoskeletal:        General: Normal range of motion.     Cervical back: Normal range of motion and neck supple.     Right lower leg: No edema.     Left lower leg: No edema.  Skin:    General: Skin is warm.  Neurological:     General: No focal deficit present.     Mental Status: She is alert and oriented to person, place, and time.  Psychiatric:        Attention and Perception: Attention normal.        Mood and Affect: Mood normal.        Speech: Speech normal.        Behavior: Behavior normal. Behavior is cooperative.        Thought Content: Thought content normal.        Cognition and Memory: Cognition normal.        Judgment: Judgment normal.    Assessment   1. Essential hypertension   2. Hyperlipidemia, unspecified hyperlipidemia type   3. Prediabetes   4. Bilateral impacted cerumen     Tests ordered No orders of the defined types were placed in this encounter.    Plan: Please see assessment and plan per problem list above.   No orders of the defined types were placed in this encounter.   Patient to follow-up in 06/09/2020   Note: This dictation was prepared with Dragon dictation along with smaller phrase technology. Similar sounding words can be  transcribed inadequately or may not be corrected upon review. Any transcriptional errors that result from this process are unintentional.      Perlie Mayo, NP

## 2020-01-27 NOTE — Patient Instructions (Addendum)
  HAPPY FALL!  I appreciate the opportunity to provide you with care for your health and wellness. Today we discussed: overall health   Follow up: Spring for CPE - with Dr Moshe Cipro if after March   No labs or referrals today  Call if your notice the leg discomfort occurs more or you have other changes we talked about  [facility]: [phone]   Phone number to Dr Deeann Saint office: [phone]  Have a wonderful [place].  Praying all is well with your breast.   Please continue to practice social distancing to keep you, your family, and our community safe.  If you must go out, please wear a mask and practice good handwashing.  It was a pleasure to see you and I look forward to continuing to work together on your health and well-being. Please do not hesitate to call the office if you need care or have questions about your care.  Have a wonderful day and week. With Gratitude, Cherly Beach, DNP, AGNP-BC

## 2020-01-29 ENCOUNTER — Other Ambulatory Visit: Payer: Self-pay

## 2020-01-29 ENCOUNTER — Encounter: Payer: Self-pay | Admitting: Family Medicine

## 2020-01-29 MED ORDER — PRAVASTATIN SODIUM 80 MG PO TABS
80.0000 mg | ORAL_TABLET | Freq: Every day | ORAL | 3 refills | Status: DC
Start: 2020-01-29 — End: 2021-02-07

## 2020-01-29 NOTE — Assessment & Plan Note (Signed)
Number to ENT provided, she was seen in the last year so should not have issue getting in.

## 2020-01-29 NOTE — Assessment & Plan Note (Signed)
Christina Carroll is encouraged to maintain a well balanced diet that is low in salt. Higher end of controlled, continue current medication regimen. But reports it is due to feeling anxious about breast Bx she needs to get.  Additionally, she is also reminded that exercise is beneficial for heart health and control of  Blood pressure. 30-60 minutes daily is recommended-walking was suggested.

## 2020-01-29 NOTE — Assessment & Plan Note (Signed)
Encourage low-fat diet 

## 2020-01-30 DIAGNOSIS — H6123 Impacted cerumen, bilateral: Secondary | ICD-10-CM | POA: Diagnosis not present

## 2020-02-05 ENCOUNTER — Ambulatory Visit
Admission: RE | Admit: 2020-02-05 | Discharge: 2020-02-05 | Disposition: A | Discharge status: Home / Self Care | Payer: Medicare Other | Source: Ambulatory Visit | Attending: Family Medicine | Admitting: Family Medicine

## 2020-02-05 ENCOUNTER — Other Ambulatory Visit: Payer: Self-pay

## 2020-02-05 DIAGNOSIS — R921 Mammographic calcification found on diagnostic imaging of breast: Secondary | ICD-10-CM

## 2020-02-05 DIAGNOSIS — N6489 Other specified disorders of breast: Secondary | ICD-10-CM | POA: Diagnosis not present

## 2020-02-06 ENCOUNTER — Telehealth: Payer: Self-pay

## 2020-02-06 NOTE — Telephone Encounter (Signed)
Patient called asking for need core biopsy. Her PCP out of office. Asked Cherly Beach NP to give results of report so that I could call patient with results. Called patient and gave results with verbal understanding.

## 2020-02-23 ENCOUNTER — Other Ambulatory Visit: Payer: Self-pay

## 2020-02-23 MED ORDER — TRIAMTERENE-HCTZ 37.5-25 MG PO TABS
1.0000 | ORAL_TABLET | Freq: Every day | ORAL | 3 refills | Status: DC
Start: 2020-02-23 — End: 2021-03-02

## 2020-06-09 ENCOUNTER — Encounter: Payer: Medicare Other | Admitting: Family Medicine

## 2020-06-16 ENCOUNTER — Ambulatory Visit (INDEPENDENT_AMBULATORY_CARE_PROVIDER_SITE_OTHER): Payer: Medicare Other

## 2020-06-16 ENCOUNTER — Other Ambulatory Visit: Payer: Self-pay

## 2020-06-16 VITALS — Ht 65.0 in | Wt 157.0 lb

## 2020-06-16 DIAGNOSIS — Z0000 Encounter for general adult medical examination without abnormal findings: Secondary | ICD-10-CM

## 2020-06-16 NOTE — Patient Instructions (Signed)
Christina Carroll , Thank you for taking time to come for your Medicare Wellness Visit. I appreciate your ongoing commitment to your health goals. Please review the following plan we discussed and let me know if I can assist you in the future.   Screening recommendations/referrals: Colonoscopy: 11/29/21 Mammogram: 01/06/22 Bone Density: Complete Recommended yearly ophthalmology/optometry visit for glaucoma screening and checkup Recommended yearly dental visit for hygiene and checkup  Vaccinations: Influenza vaccine: Fall 2022 Pneumococcal vaccine: Complete Tdap vaccine: 06/28/24 Shingles vaccine: Complete  Advanced directives: POA  Conditions/risks identified: None  Next appointment: 07/08/20 @ 8:30 am   Preventive Care 65 Years and Older, Female Preventive care refers to lifestyle choices and visits with your health care provider that can promote health and wellness. What does preventive care include?  A yearly physical exam. This is also called an annual well check.  Dental exams once or twice a year.  Routine eye exams. Ask your health care provider how often you should have your eyes checked.  Personal lifestyle choices, including:  Daily care of your teeth and gums.  Regular physical activity.  Eating a healthy diet.  Avoiding tobacco and drug use.  Limiting alcohol use.  Practicing safe sex.  Taking low-dose aspirin every day.  Taking vitamin and mineral supplements as recommended by your health care provider. What happens during an annual well check? The services and screenings done by your health care provider during your annual well check will depend on your age, overall health, lifestyle risk factors, and family history of disease. Counseling  Your health care provider may ask you questions about your:  Alcohol use.  Tobacco use.  Drug use.  Emotional well-being.  Home and relationship well-being.  Sexual activity.  Eating habits.  History of  falls.  Memory and ability to understand (cognition).  Work and work Statistician.  Reproductive health. Screening  You may have the following tests or measurements:  Height, weight, and BMI.  Blood pressure.  Lipid and cholesterol levels. These may be checked every 5 years, or more frequently if you are over 73 years old.  Skin check.  Lung cancer screening. You may have this screening every year starting at age 74 if you have a 30-pack-year history of smoking and currently smoke or have quit within the past 15 years.  Fecal occult blood test (FOBT) of the stool. You may have this test every year starting at age 52.  Flexible sigmoidoscopy or colonoscopy. You may have a sigmoidoscopy every 5 years or a colonoscopy every 10 years starting at age 70.  Hepatitis C blood test.  Hepatitis B blood test.  Sexually transmitted disease (STD) testing.  Diabetes screening. This is done by checking your blood sugar (glucose) after you have not eaten for a while (fasting). You may have this done every 1-3 years.  Bone density scan. This is done to screen for osteoporosis. You may have this done starting at age 99.  Mammogram. This may be done every 1-2 years. Talk to your health care provider about how often you should have regular mammograms. Talk with your health care provider about your test results, treatment options, and if necessary, the need for more tests. Vaccines  Your health care provider may recommend certain vaccines, such as:  Influenza vaccine. This is recommended every year.  Tetanus, diphtheria, and acellular pertussis (Tdap, Td) vaccine. You may need a Td booster every 10 years.  Zoster vaccine. You may need this after age 60.  Pneumococcal 13-valent conjugate (PCV13)  vaccine. One dose is recommended after age 86.  Pneumococcal polysaccharide (PPSV23) vaccine. One dose is recommended after age 63. Talk to your health care provider about which screenings and  vaccines you need and how often you need them. This information is not intended to replace advice given to you by your health care provider. Make sure you discuss any questions you have with your health care provider. Document Released: 04/02/2015 Document Revised: 11/24/2015 Document Reviewed: 01/05/2015 Elsevier Interactive Patient Education  2017 [place] Prevention in the Home Falls can cause injuries. They can happen to people of all ages. There are many things you can do to make your home safe and to help prevent falls. What can I do on the outside of my home?  Regularly fix the edges of walkways and driveways and fix any cracks.  Remove anything that might make you trip as you walk through a door, such as a raised step or threshold.  Trim any bushes or trees on the path to your home.  Use bright outdoor lighting.  Clear any walking paths of anything that might make someone trip, such as rocks or tools.  Regularly check to see if handrails are loose or broken. Make sure that both sides of any steps have handrails.  Any raised decks and porches should have guardrails on the edges.  Have any leaves, snow, or ice cleared regularly.  Use sand or salt on walking paths during winter.  Clean up any spills in your garage right away. This includes oil or grease spills. What can I do in the bathroom?  Use night lights.  Install grab bars by the toilet and in the tub and shower. Do not use towel bars as grab bars.  Use non-skid mats or decals in the tub or shower.  If you need to sit down in the shower, use a plastic, non-slip stool.  Keep the floor dry. Clean up any water that spills on the floor as soon as it happens.  Remove soap buildup in the tub or shower regularly.  Attach bath mats securely with double-sided non-slip rug tape.  Do not have throw rugs and other things on the floor that can make you trip. What can I do in the bedroom?  Use night  lights.  Make sure that you have a light by your bed that is easy to reach.  Do not use any sheets or blankets that are too big for your bed. They should not hang down onto the floor.  Have a firm chair that has side arms. You can use this for support while you get dressed.  Do not have throw rugs and other things on the floor that can make you trip. What can I do in the kitchen?  Clean up any spills right away.  Avoid walking on wet floors.  Keep items that you use a lot in easy-to-reach places.  If you need to reach something above you, use a strong step stool that has a grab bar.  Keep electrical cords out of the way.  Do not use floor polish or wax that makes floors slippery. If you must use wax, use non-skid floor wax.  Do not have throw rugs and other things on the floor that can make you trip. What can I do with my stairs?  Do not leave any items on the stairs.  Make sure that there are handrails on both sides of the stairs and use them. Fix handrails that are  broken or loose. Make sure that handrails are as long as the stairways.  Check any carpeting to make sure that it is firmly attached to the stairs. Fix any carpet that is loose or worn.  Avoid having throw rugs at the top or bottom of the stairs. If you do have throw rugs, attach them to the floor with carpet tape.  Make sure that you have a light switch at the top of the stairs and the bottom of the stairs. If you do not have them, ask someone to add them for you. What else can I do to help prevent falls?  Wear shoes that:  Do not have high heels.  Have rubber bottoms.  Are comfortable and fit you well.  Are closed at the toe. Do not wear sandals.  If you use a stepladder:  Make sure that it is fully opened. Do not climb a closed stepladder.  Make sure that both sides of the stepladder are locked into place.  Ask someone to hold it for you, if possible.  Clearly mark and make sure that you can  see:  Any grab bars or handrails.  First and last steps.  Where the edge of each step is.  Use tools that help you move around (mobility aids) if they are needed. These include:  Canes.  Walkers.  Scooters.  Crutches.  Turn on the lights when you go into a dark area. Replace any light bulbs as soon as they burn out.  Set up your furniture so you have a clear path. Avoid moving your furniture around.  If any of your floors are uneven, fix them.  If there are any pets around you, be aware of where they are.  Review your medicines with your doctor. Some medicines can make you feel dizzy. This can increase your chance of falling. Ask your doctor what other things that you can do to help prevent falls. This information is not intended to replace advice given to you by your health care provider. Make sure you discuss any questions you have with your health care provider. Document Released: 12/31/2008 Document Revised: 08/12/2015 Document Reviewed: 04/10/2014 Elsevier Interactive Patient Education  2017 [employer].

## 2020-06-16 NOTE — Progress Notes (Signed)
Subjective:   Christina Carroll is a 75 y.o. female who presents for Medicare Annual (Subsequent) preventive examination.  Review of Systems       Objective:    There were no vitals filed for this visit. There is no height or weight on file to calculate BMI.  Advanced Directives 06/16/2019 06/04/2017 01/01/2017 04/25/2016 12/23/2015 08/10/2015 11/30/2011  Does Patient Have a Medical Advance Directive? Yes Yes No Yes Yes No Patient has advance directive, copy not in chart  Type of Advance Directive Healthcare Power of [place] of [place]  Does patient want to make changes to medical advance directive? - - - Yes (MAU/Ambulatory/Procedural Areas - Information given) No - Patient declined - -  Copy of [place] in Chart? No - copy requested Yes - No - copy requested No - copy requested - -  Pre-existing out of facility DNR order (yellow form or pink MOST form) - - - - - - No    Current Medications (verified) Outpatient Encounter Medications as of 06/16/2020  Medication Sig  . aspirin (ASPIRIN LOW DOSE) 81 MG EC tablet Take 81 mg by mouth daily.   . calcium-vitamin D (OSCAL WITH D) 500-200 MG-UNIT TABS tablet TAKE (1) TABLET BY MOUTH (3) TIMES DAILY.  Marland Kitchen Chlorophyll 100 MG TABS Take 200 mg by mouth daily.  . Cholecalciferol (VITAMIN D) 50 MCG (2000 UT) CAPS Take 1 capsule (2,000 Units total) by mouth daily.  Marland Kitchen gabapentin (NEURONTIN) 100 MG capsule Take 1 capsule (100 mg total) by mouth daily for 15 days.  . Magnesium Oxide 200 MG TABS Take 1 tablet (200 mg total) by mouth at bedtime.  . Multiple Vitamin (MULTIVITAMIN) tablet Take 1 tablet by mouth daily.  Marland Kitchen NIFEdipine (PROCARDIA XL/NIFEDICAL XL) 60 MG 24 hr tablet TAKE 1 TABLET BY MOUTH  DAILY  . pravastatin (PRAVACHOL) 80 MG tablet Take 1 tablet (80 mg total) by mouth daily.  Marland Kitchen SYNTHROID 25 MCG tablet   .  triamterene-hydrochlorothiazide (MAXZIDE-25) 37.5-25 MG tablet Take 1 tablet by mouth daily.   No facility-administered encounter medications on file as of 06/16/2020.    Allergies (verified) Patient has no known allergies.   History: Past Medical History:  Diagnosis Date  . Arthritis   . BACK PAIN 02/02/2010   Qualifier: Diagnosis of  By: Moshe Cipro MD, Joycelyn Schmid    . Essential hypertension   . Hyperlipidemia   . Lymphocytic thyroiditis   . Metabolic syndrome X 11/11/2351  . NEVUS 05/12/2010   Qualifier: Diagnosis of  By: Moshe Cipro MD, Joycelyn Schmid    . Obesity   . Prediabetes   . Primary osteoarthritis, 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  . TENDINITIS 02/02/2010   Qualifier: Diagnosis of  By: Moshe Cipro MD, Joycelyn Schmid     Past Surgical History:  Procedure Laterality Date  . Bilateral foot surgery bone removed  2000  . COLONOSCOPY  11/30/2011   Procedure: COLONOSCOPY;  Surgeon: Rogene Houston, MD;  Location: [facility];  Service: Endoscopy;  Laterality: N/A;  830  . Lumpectomy removed from thoracic spine area  1999   Family History  Problem Relation Age of Onset  . Stroke Mother   . Diabetes Mother   . Hypertension Mother   . Coronary artery disease Father   . Arthritis Other   . Cancer Other   . Breast cancer Maternal Aunt   . Hypertension Son    Social  History   Socioeconomic History  . Marital status: Divorced    Spouse name: Not on file  . Number of children: 1  . Years of education: Not on file  . Highest education level: Not on file  Occupational History  . Occupation: Employed   Tobacco Use  . Smoking status: Never Smoker  . Smokeless tobacco: Never Used  Substance and Sexual Activity  . Alcohol use: No    Alcohol/week: 0.0 standard drinks  . Drug use: No  . Sexual activity: Not Currently  Other Topics Concern  . Not on file  Social History Narrative  . Not on file   Social Determinants of Health   Financial Resource Strain: Not on file  Food Insecurity: Not on  file  Transportation Needs: Not on file  Physical Activity: Not on file  Stress: Not on file  Social Connections: Not on file    Tobacco Counseling Counseling given: Not Answered   Clinical Intake:                 Diabetic? no         Activities of Daily Living No flowsheet data found.  Patient Care Team: Fayrene Helper, MD as PCP - General Mor[place]ati, Lourdes Sledge, MD as Attending Physician (Endocrinology) Marybelle Killings, MD as Consulting Physician (Orthopedic Surgery)  Indicate any recent Medical Services you may have received from other than [facility] providers in the past year (date may be approximate).     Assessment:   This is a routine wellness examination for [place].  Hearing/Vision screen No exam data present  Dietary issues and exercise activities discussed:    Goals    . Exercise 3x per week (30 min per time)     Patient would like to start exercising 3 times a week for at least 30 minutes at a time.     Marland Kitchen LIFESTYLE - DECREASE FALLS RISK     Has fallen a lot in her driveway and had her driveway re done with concrete so its smoother and less risk of falling       Depression Screen PHQ 2/9 Scores 01/27/2020 10/23/2019 09/24/2019 06/16/2019 05/09/2019 11/11/2018 06/05/2018  PHQ - 2 Score 0 0 0 0 0 0 0  PHQ- 9 Score - - - - - - 0    Fall Risk Fall Risk  01/27/2020 10/23/2019 09/24/2019 05/30/2019 05/09/2019  Falls in the past year? 1 1 1  0 0  Number falls in past yr: 1 0 0 0 0  Injury with Fall? 0 1 1 0 0  Risk for fall due to : Impaired balance/gait History of fall(s) History of fall(s) - -  Follow up Falls evaluation completed Falls evaluation completed Falls evaluation completed;Education provided;Falls prevention discussed - Falls evaluation completed;Education provided    FALL RISK PREVENTION PERTAINING TO THE HOME:  Any stairs in or around the home? No  If so, are there any without handrails? n/a Home free of loose throw rugs in walkways, pet beds,  electrical cords, etc? Yes  Adequate lighting in your home to reduce risk of falls? Yes   ASSISTIVE DEVICES UTILIZED TO PREVENT FALLS:  Life alert? No  Use of a cane, walker or w/c? No  Grab bars in the bathroom? No  Shower chair or bench in shower? No  Elevated toilet seat or a handicapped toilet? No   TIMED UP AND GO:  Was the test performed? No .  Length of time to ambulate n/a  Cognitive Function:     6CIT Screen 06/16/2019 06/05/2018 06/04/2017 04/25/2016  What Year? 0 points 0 points 0 points 0 points  What month? 0 points 0 points 0 points 0 points  What time? 0 points 0 points 0 points 0 points  Count back from 20 0 points 0 points 0 points 0 points  Months in reverse 0 points 0 points 0 points 0 points  Repeat phrase 0 points 0 points 0 points 0 points  Total Score 0 0 0 0    Immunizations Immunization History  Administered Date(s) Administered  . Fluad Quad(high Dose 65+) 11/11/2018, 01/05/2020  . Influenza Split 02/21/2011, 01/31/2012  . Influenza Whole 01/23/2007, 12/21/2008, 02/02/2010  . Influenza, High Dose Seasonal PF 12/21/2017  . Influenza,inj,Quad PF,6+ Mos 12/24/2013, 01/18/2015, 12/01/2015, 01/18/2017  . Moderna Sars-Covid-2 Vaccination 04/24/2019, 05/23/2019  . Pneumococcal Conjugate-13 09/22/2013  . Pneumococcal Polysaccharide-23 04/03/2013  . Td 04/10/2005  . Tdap 06/29/2014  . Zoster 04/19/2009  . Zoster Recombinat (Shingrix) 06/05/2017, 09/06/2017    TDAP status: Up to date  Flu Vaccine status: Up to date  Pneumococcal vaccine status: Up to date  Covid-19 vaccine status: Information provided on how to obtain vaccines.   Qualifies for Shingles Vaccine? Yes   Zostavax completed No   Shingrix Completed?: Yes  Screening Tests Health Maintenance  Topic Date Due  . COVID-19 Vaccine (3 - Booster for Moderna series) 11/23/2019  . COLONOSCOPY (Pts 45-66yrs Insurance coverage will need to be confirmed)  11/29/2021  . MAMMOGRAM  01/06/2022   . TETANUS/TDAP  06/28/2024  . INFLUENZA VACCINE  Completed  . DEXA SCAN  Completed  . Hepatitis C Screening  Completed  . PNA vac Low Risk Adult  Completed  . HPV VACCINES  Aged Out    Health Maintenance  Health Maintenance Due  Topic Date Due  . COVID-19 Vaccine (3 - Booster for Moderna series) 11/23/2019    Colorectal cancer screening: Type of screening: Colonoscopy. Completed 11/30/11. Repeat every 10 years  Mammogram status: Completed 01/07/20. Repeat every year  Bone Density status: Completed 09/17/18. Results reflect: Bone density results: NORMAL. Repeat every 5 years.  Lung Cancer Screening: (Low Dose CT Chest recommended if Age 46-80 years, 30 pack-year currently smoking OR have quit w/in 15years.) does not qualify.   Lung Cancer Screening Referral: n/a  Additional Screening:  Hepatitis C Screening: does not qualify; Completed  Vision Screening: Recommended annual ophthalmology exams for early detection of glaucoma and other disorders of the eye. Is the patient up to date with their annual eye exam?  No  Who is the provider or what is the name of the office in which the patient attends annual eye exams? n/a If pt is not established with a provider, would they like to be referred to a provider to establish care? No .   Dental Screening: Recommended annual dental exams for proper oral hygiene  Community Resource Referral / Chronic Care Management: CRR required this visit?  No   CCM required this visit?  No      Plan:     I have personally reviewed and noted the following in the patient's chart:   . Medical and social history . Use of alcohol, tobacco or illicit drugs  . Current medications and supplements . Functional ability and status . Nutritional status . Physical activity . Advanced directives . List of other physicians . Hospitalizations, surgeries, and ER visits in previous 12 months . Vitals . Screenings to include cognitive, depression, and  falls . Referrals and appointments  In addition, I have reviewed and discussed with patient certain preventive protocols, quality metrics, and best practice recommendations. A written personalized care plan for preventive services as well as general preventive health recommendations were provided to patient.     Laretta Bolster, [place]   6/41/5830   Nurse Notes: AWV conducted by nurse in office by phone. Patient gave consent to telehealth visit via audio. Provider in the office at the time of this visit. Patient at home at the time of this visit. Visit took 30 minutes to complete.

## 2020-07-08 ENCOUNTER — Ambulatory Visit (INDEPENDENT_AMBULATORY_CARE_PROVIDER_SITE_OTHER): Payer: Medicare Other | Admitting: Family Medicine

## 2020-07-08 ENCOUNTER — Encounter: Payer: Self-pay | Admitting: Family Medicine

## 2020-07-08 ENCOUNTER — Other Ambulatory Visit: Payer: Self-pay

## 2020-07-08 VITALS — BP 136/70 | HR 80 | Resp 15 | Ht 65.0 in | Wt 157.0 lb

## 2020-07-08 DIAGNOSIS — Z1231 Encounter for screening mammogram for malignant neoplasm of breast: Secondary | ICD-10-CM

## 2020-07-08 DIAGNOSIS — Z0000 Encounter for general adult medical examination without abnormal findings: Secondary | ICD-10-CM | POA: Diagnosis not present

## 2020-07-08 NOTE — Patient Instructions (Addendum)
F/U in mid Septemebr , call if you need me before, flu vaccine due at visit  Please schedule mammogram at checkout  Colonscopy due next year  Call for dermatology referral for face mole if you decide on this  Congrats on weight ,loss with change in diet   PLEASE bring your recent labs to office we will make a copy  No medication changes   It is important that you exercise regularly at least 30 minutes 5 times a week. If you develop chest pain, have severe difficulty breathing, or feel very tired, stop exercising immediately and seek medical attention   Think about what you will eat, plan ahead. Choose " clean, green, fresh or frozen" over canned, processed or packaged foods which are more sugary, salty and fatty. 70 to 75% of food eaten should be vegetables and fruit. Three meals at set times with snacks allowed between meals, but they must be fruit or vegetables. Aim to eat over a 12 hour period , example 7 am to 7 pm, and STOP after  your last meal of the day. Drink water,generally about 64 ounces per day, no other drink is as healthy. Fruit juice is best enjoyed in a healthy way, by EATING the fruit.  Thanks for choosing [facility], we consider it a privelige to serve you.

## 2020-07-08 NOTE — Progress Notes (Signed)
    Christina Carroll     MRN: [id number]      DOB: [birth year]  HPI: Patient is in for annual physical exam. C/o moles on face , holding on removal as not covered Has changed diet with excellent weight loss, has also started regular exercise in her yard. Feels well    PE: BP 136/70   Pulse 80   Resp 15   Ht 5\' 5"  (1.651 m)   Wt 157 lb (71.2 kg)   SpO2 98%   BMI 26.13 kg/m    Pleasant  female, alert and oriented x 3, in no cardio-pulmonary distress. Afebrile. HEENT No facial trauma or asymetry. Sinuses non tender.  Extra occullar muscles intact.. External ears normal, . Neck: supple, no adenopathy,JVD or thyromegaly.No bruits.  Chest: Clear to ascultation bilaterally.No crackles or wheezes. Non tender to palpation  Breast: Asymptomatic, not examined, mammogram uTD and normal  Cardiovascular system; Heart sounds normal,  S1 and  S2 ,no S3.  No murmur, or thrill. Apical beat not displaced Peripheral pulses normal.  Abdomen: Soft, non tender, no organomegaly or masses. No bruits. Bowel sounds normal. No guarding, tenderness or rebound.    Musculoskeletal exam: Full ROM of spine, hips , shoulders and knees. No deformity ,swelling or crepitus noted. No muscle wasting or atrophy.   Neurologic: Cranial nerves 2 to 12 intact. Power, tone ,sensation and reflexes normal throughout. No disturbance in gait. No tremor.  Skin: Intact, no ulceration, erythema , scaling or rash noted. Pigmentation normal throughout  Psych; Normal mood and affect. Judgement and concentration normal   Assessment & Plan:  Annual physical exam Annual exam as documented. Counseling done  re healthy lifestyle involving commitment to 150 minutes exercise per week, heart healthy diet, and attaining healthy weight.The importance of adequate sleep also discussed. Regular seat belt use and home safety, is also discussed. Changes in health habits are decided on by the patient with goals  and time frames  set for achieving them. Immunization and cancer screening needs are specifically addressed at this visit.

## 2020-07-08 NOTE — Assessment & Plan Note (Signed)

## 2020-09-13 ENCOUNTER — Telehealth: Payer: Self-pay

## 2020-09-13 NOTE — Telephone Encounter (Signed)
Brandi --please call the pt she had a bug bite

## 2020-09-13 NOTE — Telephone Encounter (Signed)
Got bit by something on her finger and its itching and the insurance nurse told her to get some hydrocortosone cream and if it doesn't get better to call her dr. Durward Fortes that is correct. She will try that and call back if it doesn't go away

## 2020-12-09 ENCOUNTER — Ambulatory Visit (INDEPENDENT_AMBULATORY_CARE_PROVIDER_SITE_OTHER): Payer: Medicare Other | Admitting: Family Medicine

## 2020-12-09 ENCOUNTER — Encounter: Payer: Self-pay | Admitting: Family Medicine

## 2020-12-09 ENCOUNTER — Other Ambulatory Visit: Payer: Self-pay

## 2020-12-09 VITALS — BP 134/86 | HR 70 | Resp 20 | Ht 63.0 in | Wt 157.0 lb

## 2020-12-09 DIAGNOSIS — E663 Overweight: Secondary | ICD-10-CM | POA: Diagnosis not present

## 2020-12-09 DIAGNOSIS — E559 Vitamin D deficiency, unspecified: Secondary | ICD-10-CM

## 2020-12-09 DIAGNOSIS — E785 Hyperlipidemia, unspecified: Secondary | ICD-10-CM

## 2020-12-09 DIAGNOSIS — I10 Essential (primary) hypertension: Secondary | ICD-10-CM | POA: Diagnosis not present

## 2020-12-09 DIAGNOSIS — R7303 Prediabetes: Secondary | ICD-10-CM

## 2020-12-09 DIAGNOSIS — Z23 Encounter for immunization: Secondary | ICD-10-CM | POA: Diagnosis not present

## 2020-12-09 NOTE — Patient Instructions (Addendum)
Annual exam with MD April 30 or after , call if you need me sooner  Flu vaccine today  Covid booster as planned  It is important that you exercise regularly at least 30 minutes 5 times a week. If you develop chest pain, have severe difficulty breathing, or feel very tired, stop exercising immediately and seek medical attention    Excellent labs except bad cholesterol slightly high in April  Labs today, lipid, cmp and EGFr, hBa1C and vit D   Thanks for choosing [facility], we consider it a privelige to serve you.

## 2020-12-09 NOTE — Progress Notes (Signed)
REMI RESTER     MRN: [id number]      DOB: [birth year]   HPI Ms. Rainone is here for follow up and re-evaluation of chronic medical conditions, medication management and review of any available recent lab and radiology data.  Preventive health is updated, specifically  Cancer screening and Immunization.   Questions or concerns regarding consultations or procedures which the PT has had in the interim are  addressed. The PT denies any adverse reactions to current medications since the last visit.  There are no new concerns.  There are no specific complaints   ROS Denies recent fever or chills. Denies sinus pressure, nasal congestion, ear pain or sore throat. Denies chest congestion, productive cough or wheezing. Denies chest pains, palpitations and leg swelling Denies abdominal pain, nausea, vomiting,diarrhea or constipation.   Denies dysuria, frequency, hesitancy or incontinence. Denies joint pain, swelling and limitation in mobility. Intermittent right leg pain Denies headaches, seizures, numbness, or tingling. Denies depression, anxiety or insomnia. Denies skin break down or rash.   PE  BP 134/86 (BP Location: Left Arm, Patient Position: Sitting, Cuff Size: Normal)   Pulse 70   Resp 20   Ht 5\' 3"  (1.6 m)   Wt 157 lb (71.2 kg)   SpO2 98%   BMI 27.81 kg/m   Patient alert and oriented and in no cardiopulmonary distress.  HEENT: No facial asymmetry, EOMI,     Neck supple .  Chest: Clear to auscultation bilaterally.  CVS: S1, S2 no murmurs, no S3.Regular rate.  ABD: Soft non tender.   Ext: No edema  MS: Adequate ROM spine, shoulders, hips and knees.  Skin: Intact, no ulcerations or rash noted.  Psych: Good eye contact, normal affect. Memory intact not anxious or depressed appearing.  CNS: CN 2-12 intact, power,  normal throughout.no focal deficits noted.   Assessment & Plan  Essential hypertension Controlled, no change in medication DASH diet and  commitment to daily physical activity for a minimum of 30 minutes discussed and encouraged, as a part of hypertension management. The importance of attaining a healthy weight is also discussed.  BP/Weight 12/09/2020 07/08/2020 06/16/2020 01/27/2020 10/23/2019 09/24/2019 9/56/3875  Systolic BP 643 329 - 518 841 660 630  Diastolic BP 86 70 - 84 81 86 84  Wt. (Lbs) 157 157 157 168 175.12 177 175  BMI 27.81 26.13 26.13 27.96 29.14 29.45 29.12       Overweight (BMI 25.0-29.9)  Patient re-educated about  the importance of commitment to a  minimum of 150 minutes of exercise per week as able.  The importance of healthy food choices with portion control discussed, as well as eating regularly and within a 12 hour window most days. The need to choose "clean , green" food 50 to 75% of the time is discussed, as well as to make water the primary drink and set a goal of 64 ounces water daily.    Weight /BMI 12/09/2020 07/08/2020 06/16/2020  WEIGHT 157 lb 157 lb 157 lb  HEIGHT 5\' 3"  5\' 5"  5\' 5"   BMI 27.81 kg/m2 26.13 kg/m2 26.13 kg/m2      Hyperlipidemia Hyperlipidemia:Low fat diet discussed and encouraged.   Lipid Panel  Lab Results  Component Value Date   CHOL 218 (H) 12/09/2020   HDL 92 12/09/2020   LDLCALC 114 (H) 12/09/2020   TRIG 69 12/09/2020   CHOLHDL 2.4 12/09/2020     Needs to reduce fat in diet, not at goal  Prediabetes Patient educated about  the importance of limiting  Carbohydrate intake , the need to commit to daily physical activity for a minimum of 30 minutes , and to commit weight loss. The fact that changes in all these areas will reduce or eliminate all together the development of diabetes is stressed.  Improved  Diabetic Labs Latest Ref Rng & Units 12/09/2020 05/07/2019 11/06/2018 03/27/2018 10/10/2017  HbA1c 4.8 - 5.6 % 5.7(H) 5.9(H) 5.8(H) 5.9(H) 5.8(H)  Chol 100 - 199 mg/dL 218(H) 205(H) 207(H) 197 210(H)  HDL >39 mg/dL 92 72 74 63 74  Calc LDL 0 - 99 mg/dL 114(H) 115(H)  116(H) 115(H) 120(H)  Triglycerides 0 - 149 mg/dL 69 84 71 88 72  Creatinine 0.57 - 1.00 mg/dL 1.09(H) 1.03(H) 1.00(H) 0.91 0.91   BP/Weight 12/09/2020 07/08/2020 06/16/2020 01/27/2020 10/23/2019 09/24/2019 10/20/2120  Systolic BP 482 500 - 370 488 891 694  Diastolic BP 86 70 - 84 81 86 84  Wt. (Lbs) 157 157 157 168 175.12 177 175  BMI 27.81 26.13 26.13 27.96 29.14 29.45 29.12   No flowsheet data found.

## 2020-12-10 LAB — CMP14+EGFR
ALT: 19 IU/L (ref 0–32)
AST: 23 IU/L (ref 0–40)
Albumin/Globulin Ratio: 2.2 (ref 1.2–2.2)
Albumin: 4.9 g/dL — ABNORMAL HIGH (ref 3.7–4.7)
Alkaline Phosphatase: 101 IU/L (ref 44–121)
BUN/Creatinine Ratio: 23 (ref 12–28)
BUN: 25 mg/dL (ref 8–27)
Bilirubin Total: 0.8 mg/dL (ref 0.0–1.2)
CO2: 28 mmol/L (ref 20–29)
Calcium: 10.7 mg/dL — ABNORMAL HIGH (ref 8.7–10.3)
Chloride: 96 mmol/L (ref 96–106)
Creatinine, Ser: 1.09 mg/dL — ABNORMAL HIGH (ref 0.57–1.00)
Globulin, Total: 2.2 g/dL (ref 1.5–4.5)
Glucose: 81 mg/dL (ref 65–99)
Potassium: 4.6 mmol/L (ref 3.5–5.2)
Sodium: 139 mmol/L (ref 134–144)
Total Protein: 7.1 g/dL (ref 6.0–8.5)
eGFR: 54 mL/min/{1.73_m2} — ABNORMAL LOW (ref >59)

## 2020-12-10 LAB — LIPID PANEL
Chol/HDL Ratio: 2.4 ratio (ref 0.0–4.4)
Cholesterol, Total: 218 mg/dL — ABNORMAL HIGH (ref 100–199)
HDL: 92 mg/dL (ref >39)
LDL Chol Calc (NIH): 114 mg/dL — ABNORMAL HIGH (ref 0–99)
Triglycerides: 69 mg/dL (ref 0–149)
VLDL Cholesterol Cal: 12 mg/dL (ref 5–40)

## 2020-12-10 LAB — VITAMIN D 25 HYDROXY (VIT D DEFICIENCY, FRACTURES): Vit D, 25-Hydroxy: 53.8 ng/mL (ref 30.0–100.0)

## 2020-12-10 LAB — HEMOGLOBIN A1C
Est. average glucose Bld gHb Est-mCnc: 117 mg/dL
Hgb A1c MFr Bld: 5.7 % — ABNORMAL HIGH (ref 4.8–5.6)

## 2020-12-12 NOTE — Assessment & Plan Note (Signed)
Hyperlipidemia:Low fat diet discussed and encouraged.   Lipid Panel  Lab Results  Component Value Date   CHOL 218 (H) 12/09/2020   HDL 92 12/09/2020   LDLCALC 114 (H) 12/09/2020   TRIG 69 12/09/2020   CHOLHDL 2.4 12/09/2020     Needs to reduce fat in diet, not at goal

## 2020-12-12 NOTE — Assessment & Plan Note (Signed)
Patient educated about the importance of limiting  Carbohydrate intake , the need to commit to daily physical activity for a minimum of 30 minutes , and to commit weight loss. The fact that changes in all these areas will reduce or eliminate all together the development of diabetes is stressed.  Improved  Diabetic Labs Latest Ref Rng & Units 12/09/2020 05/07/2019 11/06/2018 03/27/2018 10/10/2017  HbA1c 4.8 - 5.6 % 5.7(H) 5.9(H) 5.8(H) 5.9(H) 5.8(H)  Chol 100 - 199 mg/dL 218(H) 205(H) 207(H) 197 210(H)  HDL >39 mg/dL 92 72 74 63 74  Calc LDL 0 - 99 mg/dL 114(H) 115(H) 116(H) 115(H) 120(H)  Triglycerides 0 - 149 mg/dL 69 84 71 88 72  Creatinine 0.57 - 1.00 mg/dL 1.09(H) 1.03(H) 1.00(H) 0.91 0.91   BP/Weight 12/09/2020 07/08/2020 06/16/2020 01/27/2020 10/23/2019 09/24/2019 2/76/1848  Systolic BP 592 763 - 943 200 379 444  Diastolic BP 86 70 - 84 81 86 84  Wt. (Lbs) 157 157 157 168 175.12 177 175  BMI 27.81 26.13 26.13 27.96 29.14 29.45 29.12   No flowsheet data found.

## 2020-12-12 NOTE — Assessment & Plan Note (Signed)
  Patient re-educated about  the importance of commitment to a  minimum of 150 minutes of exercise per week as able.  The importance of healthy food choices with portion control discussed, as well as eating regularly and within a 12 hour window most days. The need to choose "clean , green" food 50 to 75% of the time is discussed, as well as to make water the primary drink and set a goal of 64 ounces water daily.    Weight /BMI 12/09/2020 07/08/2020 06/16/2020  WEIGHT 157 lb 157 lb 157 lb  HEIGHT 5\' 3"  5\' 5"  5\' 5"   BMI 27.81 kg/m2 26.13 kg/m2 26.13 kg/m2

## 2020-12-12 NOTE — Assessment & Plan Note (Signed)
Controlled, no change in medication DASH diet and commitment to daily physical activity for a minimum of 30 minutes discussed and encouraged, as a part of hypertension management. The importance of attaining a healthy weight is also discussed.  BP/Weight 12/09/2020 07/08/2020 06/16/2020 01/27/2020 10/23/2019 09/24/2019 0/40/4591  Systolic BP 368 599 - 234 144 360 165  Diastolic BP 86 70 - 84 81 86 84  Wt. (Lbs) 157 157 157 168 175.12 177 175  BMI 27.81 26.13 26.13 27.96 29.14 29.45 29.12

## 2020-12-20 ENCOUNTER — Other Ambulatory Visit: Payer: Self-pay

## 2020-12-20 ENCOUNTER — Telehealth: Payer: Self-pay | Admitting: Family Medicine

## 2020-12-20 ENCOUNTER — Encounter (INDEPENDENT_AMBULATORY_CARE_PROVIDER_SITE_OTHER): Payer: Self-pay | Admitting: *Deleted

## 2020-12-20 DIAGNOSIS — Z1211 Encounter for screening for malignant neoplasm of colon: Secondary | ICD-10-CM

## 2020-12-20 NOTE — Telephone Encounter (Signed)
Please place a referral for a colonoscopy to Dr Otelia Limes office

## 2020-12-20 NOTE — Telephone Encounter (Signed)
Referral entered  

## 2021-01-02 ENCOUNTER — Other Ambulatory Visit: Payer: Self-pay | Admitting: Family Medicine

## 2021-02-07 ENCOUNTER — Ambulatory Visit (HOSPITAL_COMMUNITY)
Admission: RE | Admit: 2021-02-07 | Discharge: 2021-02-07 | Disposition: A | Discharge status: Home / Self Care | Payer: Medicare Other | Source: Ambulatory Visit | Attending: Family Medicine | Admitting: Family Medicine

## 2021-02-07 ENCOUNTER — Other Ambulatory Visit: Payer: Self-pay | Admitting: *Deleted

## 2021-02-07 ENCOUNTER — Other Ambulatory Visit: Payer: Self-pay

## 2021-02-07 DIAGNOSIS — Z1231 Encounter for screening mammogram for malignant neoplasm of breast: Secondary | ICD-10-CM | POA: Insufficient documentation

## 2021-02-07 MED ORDER — PRAVASTATIN SODIUM 80 MG PO TABS
80.0000 mg | ORAL_TABLET | Freq: Every day | ORAL | 3 refills | Status: DC
Start: 2021-02-07 — End: 2022-02-06

## 2021-03-02 ENCOUNTER — Other Ambulatory Visit: Payer: Self-pay | Admitting: Family Medicine

## 2021-05-20 ENCOUNTER — Encounter: Payer: Self-pay | Admitting: Family Medicine

## 2021-06-08 LAB — COLOGUARD: COLOGUARD: NEGATIVE

## 2021-06-20 ENCOUNTER — Ambulatory Visit (INDEPENDENT_AMBULATORY_CARE_PROVIDER_SITE_OTHER): Payer: Medicare Other

## 2021-06-20 DIAGNOSIS — Z0000 Encounter for general adult medical examination without abnormal findings: Secondary | ICD-10-CM

## 2021-06-20 NOTE — Patient Instructions (Signed)
?  Christina Carroll , ?Thank you for taking time to come for your Medicare Wellness Visit. I appreciate your ongoing commitment to your health goals. Please review the following plan we discussed and let me know if I can assist you in the future.  ? ?These are the goals we discussed: ? Goals   ? ?  Exercise 3x per week (30 min per time)   ?  Patient would like to start exercising 3 times a week for at least 30 minutes at a time.  ?  ?  LIFESTYLE - DECREASE FALLS RISK   ?  Has fallen a lot in her driveway and had her driveway re done with concrete so its smoother and less risk of falling  ?  ?  Patient Stated   ?  Patient states that her goals in to live long enough to watch her grand baby grow up and to be as healthy as she can. ?  ? ?  ?  ?This is a list of the screening recommended for you and due dates:  ?Health Maintenance  ?Topic Date Due  ? COVID-19 Vaccine (3 - Booster for Moderna series) 02/10/2021  ? Flu Shot  10/18/2021  ? Mammogram  02/08/2023  ? Cologuard (Stool DNA test)  05/31/2024  ? Tetanus Vaccine  06/28/2024  ? Pneumonia Vaccine  Completed  ? DEXA scan (bone density measurement)  Completed  ? Hepatitis C Screening: USPSTF Recommendation to screen - Ages 33-79 yo.  Completed  ? Zoster (Shingles) Vaccine  Completed  ? HPV Vaccine  Aged Out  ? Colon Cancer Screening  Discontinued  ?  ?

## 2021-06-20 NOTE — Progress Notes (Signed)
? ?Subjective:  ? Christina Carroll is a 76 y.o. female who presents for Medicare Annual (Subsequent) preventive examination. ?I connected with  [name] on 06/20/21 by a audio enabled telemedicine application and verified that I am speaking with the correct person using two identifiers. ? ?Patient Location: Home ? ?Provider Location: Office/Clinic ? ?I discussed the limitations of evaluation and management by telemedicine. The patient expressed understanding and agreed to proceed.  ?Review of Systems    ? ?Cardiac Risk Factors include: advanced age (>34mn, >>77women);hypertension ? ?   ?Objective:  ?  ?There were no vitals filed for this visit. ?There is no height or weight on file to calculate BMI. ? ? ?  06/20/2021  ?  8:08 AM 06/16/2020  ?  9:16 AM 06/16/2019  ? 10:25 AM 06/04/2017  ?  3:03 PM 01/01/2017  ?  8:28 PM 04/25/2016  ?  9:42 AM 12/23/2015  ?  9:53 AM  ?Advanced Directives  ?Does Patient Have a Medical Advance Directive? No Yes Yes Yes No Yes Yes  ?Type of AArmed forces technical officerof [place] ?Does patient want to make changes to medical advance directive?      Yes (MAU/Ambulatory/Procedural Areas - Information given) No - Patient declined  ?Copy of [place] Chart?  No - copy requested No - copy requested Yes  No - copy requested No - copy requested  ?Would patient like information on creating a medical advance directive? No - Patient declined        ? ? ?Current Medications (verified) ?Outpatient Encounter Medications as of 06/20/2021  ?Medication Sig  ? aspirin 81 MG EC tablet Take 81 mg by mouth daily.  ? Calcium Carb-Cholecalciferol (OYSTER SHELL CALCIUM W/D) 500-5 MG-MCG TABS Take 1 tablet by mouth 3 (three) times daily.  ? Cholecalciferol (VITAMIN D) 50 MCG (2000 UT) CAPS Take 1 capsule (2,000 Units total) by mouth daily.  ? Multiple  Vitamin (MULTIVITAMIN) tablet Take 1 tablet by mouth daily.  ? NIFEdipine (PROCARDIA XL/NIFEDICAL XL) 60 MG 24 hr tablet TAKE 1 TABLET BY MOUTH  DAILY  ? pravastatin (PRAVACHOL) 80 MG tablet Take 1 tablet (80 mg total) by mouth daily.  ? triamterene-hydrochlorothiazide (MAXZIDE-25) 37.5-25 MG tablet TAKE 1 TABLET BY MOUTH  DAILY  ? UNITHROID 50 MCG tablet Take 50 mcg by mouth daily.  ? SYNTHROID 25 MCG tablet  (Patient not taking: Reported on 06/20/2021)  ? [DISCONTINUED] calcium-vitamin D (OSCAL WITH D) 500-200 MG-UNIT TABS tablet TAKE (1) TABLET BY MOUTH (3) TIMES DAILY.  ? ?No facility-administered encounter medications on file as of 06/20/2021.  ? ? ?Allergies (verified) ?Patient has no known allergies.  ? ?History: ?Past Medical History:  ?Diagnosis Date  ? Arthritis   ? BACK PAIN 02/02/2010  ? Qualifier: Diagnosis of  By: SMoshe CiproMD, MJoycelyn Schmid   ? Essential hypertension   ? Hyperlipidemia   ? Hypertension   ? Phreesia 07/05/2020  ? Lymphocytic thyroiditis   ? Metabolic syndrome X 90/27/2536 ? NEVUS 05/12/2010  ? Qualifier: Diagnosis of  By: SMoshe CiproMD, MJoycelyn Schmid   ? Obesity   ? Prediabetes   ? 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 TENDINITIS 02/02/2010  ? Qualifier: Diagnosis of  By: SMoshe CiproMD, MJoycelyn Schmid   ? Thyroid disease   ? Phreesia 07/05/2020  ? ?Past Surgical History:  ?Procedure Laterality  Date  ? Bilateral foot surgery bone removed  2000  ? COLONOSCOPY  11/30/2011  ? Procedure: COLONOSCOPY;  Surgeon: Rogene Houston, MD;  [facility];  Service: Endoscopy;  Laterality: N/A;  830  ? Lumpectomy removed from thoracic spine area  1999  ? ?Family History  ?Problem Relation Age of Onset  ? Stroke Mother   ? Diabetes Mother   ? Hypertension Mother   ? Coronary artery disease Father   ? Arthritis Other   ? Cancer Other   ? Breast cancer Maternal Aunt   ? Hypertension Son   ? ?Social History  ? ?Socioeconomic History  ? Marital status: Divorced  ?  Spouse name: Not on file  ? Number of  children: 1  ? Years of education: Not on file  ? Highest education level: Not on file  ?Occupational History  ? Occupation: Employed   ?Tobacco Use  ? Smoking status: Never  ? Smokeless tobacco: Never  ?Substance and Sexual Activity  ? Alcohol use: No  ?  Alcohol/week: 0.0 standard drinks  ? Drug use: No  ? Sexual activity: Not Currently  ?Other Topics Concern  ? Not on file  ?Social History Narrative  ? Not on file  ? ?Social Determinants of Health  ? ?Financial Resource Strain: Not on file  ?Food Insecurity: Not on file  ?Transportation Needs: Not on file  ?Physical Activity: Not on file  ?Stress: Not on file  ?Social Connections: Not on file  ? ? ?Tobacco Counseling ?Counseling given: Not Answered ? ? ?Clinical Intake: ? ?  ? ?Pain : No/denies pain ? ?  ? ?Diabetes: No ? ?How often do you need to have someone help you when you read instructions, pamphlets, or other written materials from your doctor or pharmacy?: 1 - Never ? ?Diabetic?no ? ?  ? ?  ? ? ?Activities of Daily Living ? ?  06/20/2021  ?  8:10 AM  ?In your present state of health, do you have any difficulty performing the following activities:  ?Hearing? 1  ?Comment sometimes  ?Vision? 1  ?Comment wears reading glasses  ?Difficulty concentrating or making decisions? 0  ?Walking or climbing stairs? 1  ?Comment climbing stairs sometimes due to hip pain  ?Dressing or bathing? 0  ?Doing errands, shopping? 0  ?Preparing Food and eating ? N  ?Using the Toilet? N  ?In the past six months, have you accidently leaked urine? N  ?Do you have problems with loss of bowel control? N  ?Managing your Medications? N  ?Managing your Finances? N  ?Housekeeping or managing your Housekeeping? N  ? ? ?Patient Care Team: ?Fayrene Helper, MD as PCP - General ?Lenard Simmer, MD as Attending Physician (Endocrinology) ?Marybelle Killings, MD as Consulting Physician (Orthopedic Surgery) ? ?Indicate any recent Medical Services you may have received from other than [facility]  providers in the past year (date may be approximate). ? ?   ?Assessment:  ? This is a routine wellness examination for [place]. ? ?Hearing/Vision screen ?No results found. ? ?Dietary issues and exercise activities discussed: ?Current Exercise Habits: The patient does not participate in regular exercise at present, Type of exercise: walking, Time (Minutes): 20, Frequency (Times/Week): 2, Weekly Exercise (Minutes/Week): 40, Intensity: Mild, Exercise limited by: orthopedic condition(s) ? ? Goals Addressed   ? ?  ?  ?  ?  ? This Visit's Progress  ?  Patient Stated     ?  Patient states that her goals  in to live long enough to watch her grand baby grow up and to be as healthy as she can. ?  ? ?  ? ?Depression Screen ? ?  06/20/2021  ?  8:09 AM 12/09/2020  ?  8:08 AM 06/16/2020  ?  9:13 AM 01/27/2020  ?  1:14 PM 10/23/2019  ?  3:07 PM 09/24/2019  ?  3:25 PM 06/16/2019  ? 10:33 AM  ?PHQ 2/9 Scores  ?PHQ - 2 Score 0 0 0 0 0 0 0  ?  ?Fall Risk ? ?  06/20/2021  ?  8:08 AM 12/09/2020  ?  8:07 AM 07/08/2020  ?  8:39 AM 06/16/2020  ?  9:16 AM 01/27/2020  ?  1:14 PM  ?Fall Risk   ?Falls in the past year? 1 0 '1 1 1  '$ ?Number falls in past yr: 0 0 '1 1 1  '$ ?Injury with Fall? 0 0 0 0 0  ?Risk for fall due to : History of fall(s) No Fall Risks  Impaired balance/gait Impaired balance/gait  ?Follow up Falls evaluation completed Falls evaluation completed  Falls evaluation completed Falls evaluation completed  ? ? ?FALL RISK PREVENTION PERTAINING TO THE HOME: ? ?Any stairs in or around the home? Yes  ?If so, are there any without handrails? No  ?Home free of loose throw rugs in walkways, pet beds, electrical cords, etc? Yes  ?Adequate lighting in your home to reduce risk of falls? Yes  ? ?ASSISTIVE DEVICES UTILIZED TO PREVENT FALLS: ? ?Life alert? No  ?Use of a cane, walker or w/c? No  ?Grab bars in the bathroom? Yes  ?Shower chair or bench in shower? Yes  ?Elevated toilet seat or a handicapped toilet? Yes  ? ?TIMED UP AND GO: ? ?Was the test performed?  No .  ?Length of time to ambulate 10 feet:  sec.  ? ? ? ?Cognitive Function: ?  ?  ? ?  06/20/2021  ?  8:14 AM 06/16/2020  ?  9:17 AM 06/16/2019  ? 10:28 AM 06/05/2018  ?  3:15 PM 06/04/2017  ?  3:06 PM  ?6CIT Screen  ?What

## 2021-07-21 ENCOUNTER — Ambulatory Visit (INDEPENDENT_AMBULATORY_CARE_PROVIDER_SITE_OTHER): Payer: Medicare Other | Admitting: Family Medicine

## 2021-07-21 ENCOUNTER — Encounter: Payer: Self-pay | Admitting: Family Medicine

## 2021-07-21 VITALS — BP 124/79 | HR 70 | Ht 63.0 in | Wt 161.1 lb

## 2021-07-21 DIAGNOSIS — E559 Vitamin D deficiency, unspecified: Secondary | ICD-10-CM

## 2021-07-21 DIAGNOSIS — I10 Essential (primary) hypertension: Secondary | ICD-10-CM

## 2021-07-21 DIAGNOSIS — Z1231 Encounter for screening mammogram for malignant neoplasm of breast: Secondary | ICD-10-CM

## 2021-07-21 DIAGNOSIS — Z0000 Encounter for general adult medical examination without abnormal findings: Secondary | ICD-10-CM

## 2021-07-21 DIAGNOSIS — E785 Hyperlipidemia, unspecified: Secondary | ICD-10-CM | POA: Diagnosis not present

## 2021-07-21 DIAGNOSIS — R7303 Prediabetes: Secondary | ICD-10-CM

## 2021-07-21 NOTE — Progress Notes (Signed)
? ? ?  Christina Carroll     MRN: [id number]      DOB: [birth year] ? ?HPI: ?Patient is in for annual physical exam. ?No other health concerns are expressed or addressed at the visit. ?Recent labs,  are reviewed. ?Immunization is reviewed , and  updated if needed. ? ? ?PE: ?BP 124/79   Pulse 70   Ht '5\' 3"'$  (1.6 m)   Wt 161 lb 1.9 oz (73.1 kg)   SpO2 98%   BMI 28.54 kg/m?  ? ?Pleasant  female, alert and oriented x 3, in no cardio-pulmonary distress. ?Afebrile. ?HEENT ?No facial trauma or asymetry. Sinuses non tender.  ?Extra occullar muscles intact.Marland Kitchen ?External ears normal, . ?Neck: supple, no adenopathy,JVD or thyromegaly.No bruits. ? ?Chest: ?Clear to ascultation bilaterally.No crackles or wheezes. ?Non tender to palpation ? ?Heart sounds normal,  S1 and  S2 ,no S3.  No murmur, or thrill. ?Apical beat not displaced ?Peripheral pulses normal. ? ? ? ? ? ?Musculoskeletal exam: ?Full ROM of spine, hips , shoulders and knees. ?No deformity ,swelling or crepitus noted. ?No muscle wasting or atrophy.  ? ?Neurologic: ?Cranial nerves 2 to 12 intact. ?Power, tone ,sensation and reflexes normal throughout. ?No disturbance in gait. No tremor. ? ?Skin: ?Intact, no ulceration, erythema , scaling or rash noted. ?Pigmentation normal throughout ? ?Psych; ?Normal mood and affect. Judgement and concentration normal ? ? ?Assessment & Plan:  ?Annual physical exam ?Annual exam as documented. ?Counseling done  re healthy lifestyle involving commitment to 150 minutes exercise per week, heart healthy diet, and attaining healthy weight.The importance of adequate sleep also discussed. ?Regular seat belt use and home safety, is also discussed. ?Changes in health habits are decided on by the patient with goals and time frames  set for achieving them. ?Immunization and cancer screening needs are specifically addressed at this visit. ? ? ?

## 2021-07-21 NOTE — Assessment & Plan Note (Signed)

## 2021-07-21 NOTE — Patient Instructions (Addendum)
F/U in January , call if you need me sooner ? ?Annual physical in 1 year ? ?Fasting lipid, chem 7 and eGFr, HBA1C and vit D 1 week before January appointment ? ? ?Please call and come for flu vaccine in the Fall ? ?Need covid booster ? ?Please schedule mammogram at checkout ? ?Commit to exercise 30 mins 5 days / week ? ?Try to make 80 % of what you eat fresh/ frozen fruit and vegetable, reduce fattty foods and sweets ?Thanks for choosing [facility], we consider it a privelige to serve you. ? ?

## 2021-08-03 ENCOUNTER — Telehealth: Payer: Self-pay | Admitting: Family Medicine

## 2021-08-03 ENCOUNTER — Other Ambulatory Visit: Payer: Self-pay

## 2021-08-03 DIAGNOSIS — Z780 Asymptomatic menopausal state: Secondary | ICD-10-CM

## 2021-08-03 NOTE — Telephone Encounter (Signed)
[employer] called and asked [place] to order a Bone Density for this patient.  ?

## 2021-08-03 NOTE — Telephone Encounter (Signed)
Dexa ordered for patient  ?

## 2021-08-09 ENCOUNTER — Other Ambulatory Visit (HOSPITAL_COMMUNITY): Payer: Medicare Other

## 2021-08-16 ENCOUNTER — Ambulatory Visit (HOSPITAL_COMMUNITY)
Admission: RE | Admit: 2021-08-16 | Discharge: 2021-08-16 | Disposition: A | Discharge status: Home / Self Care | Payer: Medicare Other | Source: Ambulatory Visit | Attending: Family Medicine | Admitting: Family Medicine

## 2021-08-16 DIAGNOSIS — Z780 Asymptomatic menopausal state: Secondary | ICD-10-CM | POA: Insufficient documentation

## 2021-11-11 ENCOUNTER — Encounter (INDEPENDENT_AMBULATORY_CARE_PROVIDER_SITE_OTHER): Payer: Self-pay | Admitting: *Deleted

## 2021-11-30 ENCOUNTER — Ambulatory Visit (INDEPENDENT_AMBULATORY_CARE_PROVIDER_SITE_OTHER): Payer: Medicare Other

## 2021-11-30 DIAGNOSIS — Z23 Encounter for immunization: Secondary | ICD-10-CM | POA: Diagnosis not present

## 2022-01-15 ENCOUNTER — Other Ambulatory Visit: Payer: Self-pay | Admitting: Family Medicine

## 2022-02-06 ENCOUNTER — Telehealth (INDEPENDENT_AMBULATORY_CARE_PROVIDER_SITE_OTHER): Payer: Self-pay | Admitting: *Deleted

## 2022-02-06 NOTE — Telephone Encounter (Signed)
Room 3 

## 2022-02-06 NOTE — Telephone Encounter (Signed)
LMOVM to call back 

## 2022-02-06 NOTE — Telephone Encounter (Signed)
  Procedure: colonoscopy  Has patient had this procedure before?  Yes 10 years or more  If so, when, by whom and where?    Is there a family history of colon cancer?  no  Who?  What age when diagnosed?    Is patient diabetic? If yes, Type 1 or Type 2   no      Does patient have prosthetic heart valve or mechanical valve?  no  Do you have a pacemaker/defibrillator?  no  Has patient ever had endocarditis/atrial fibrillation? no  Does patient use oxygen? no  Has patient had joint replacement within last 12 months?  no  Is patient constipated or do they take laxatives? no  Does patient have a history of alcohol/drug use?  no  Have you had a stroke/heart attack last 6 mths? no  Do you take medicine for weight loss?  no  For female patients,: have you had a hysterectomy no                      are you post menopausal yes                      do you still have your menstrual cycle no  Is patient on blood thinner such as Coumadin, Plavix and/or Aspirin? no  Medications:  Current Outpatient Medications on File Prior to Visit  Medication Sig Dispense Refill   Calcium Carbonate-Vitamin D (OYSTER SHELL CALCIUM 500 + D PO) Take 1 tablet by mouth in the morning, at noon, and at bedtime.     Cholecalciferol (VITAMIN D) 50 MCG (2000 UT) CAPS Take 1 capsule (2,000 Units total) by mouth daily. 100 capsule 3   NIFEdipine (PROCARDIA XL/NIFEDICAL XL) 60 MG 24 hr tablet TAKE 1 TABLET BY MOUTH  DAILY 90 tablet 3   pravastatin (PRAVACHOL) 80 MG tablet Take 80 mg by mouth daily.     triamterene-hydrochlorothiazide (MAXZIDE-25) 37.5-25 MG tablet TAKE 1 TABLET BY MOUTH  DAILY 90 tablet 3   UNITHROID 50 MCG tablet Take 50 mcg by mouth daily.     No current facility-administered medications on file prior to visit.     Allergies: No Known Allergies

## 2022-02-07 ENCOUNTER — Encounter: Payer: Self-pay | Admitting: *Deleted

## 2022-02-07 MED ORDER — PEG 3350-KCL-NA BICARB-NACL 420 G PO SOLR: 4000.0000 mL | Freq: Once | ORAL | 0 refills | Status: AC

## 2022-02-07 NOTE — Telephone Encounter (Signed)
Spoke with pt. Scheduled for 1/16 at 1:$5pm. Aware will send prep instructions/pre-op appt. Rx for prep sent to pharmacy. Confirmed she stated she will have same insurance next year.

## 2022-02-07 NOTE — Telephone Encounter (Signed)
From  recall list, no referral needed

## 2022-02-08 ENCOUNTER — Encounter: Payer: Self-pay | Admitting: *Deleted

## 2022-02-10 ENCOUNTER — Ambulatory Visit (HOSPITAL_COMMUNITY): Payer: Medicare Other

## 2022-02-13 ENCOUNTER — Other Ambulatory Visit: Payer: Self-pay | Admitting: Family Medicine

## 2022-02-20 ENCOUNTER — Ambulatory Visit (HOSPITAL_COMMUNITY)
Admission: RE | Admit: 2022-02-20 | Discharge: 2022-02-20 | Disposition: A | Discharge status: Home / Self Care | Payer: Medicare Other | Source: Ambulatory Visit | Attending: Family Medicine | Admitting: Family Medicine

## 2022-02-20 DIAGNOSIS — Z1231 Encounter for screening mammogram for malignant neoplasm of breast: Secondary | ICD-10-CM | POA: Diagnosis present

## 2022-02-21 ENCOUNTER — Other Ambulatory Visit (HOSPITAL_COMMUNITY): Payer: Self-pay | Admitting: Family Medicine

## 2022-02-21 DIAGNOSIS — R928 Other abnormal and inconclusive findings on diagnostic imaging of breast: Secondary | ICD-10-CM

## 2022-03-08 ENCOUNTER — Ambulatory Visit (HOSPITAL_COMMUNITY)
Admission: RE | Admit: 2022-03-08 | Discharge: 2022-03-08 | Disposition: A | Discharge status: Home / Self Care | Payer: Medicare Other | Source: Ambulatory Visit | Attending: Family Medicine | Admitting: Family Medicine

## 2022-03-08 DIAGNOSIS — R928 Other abnormal and inconclusive findings on diagnostic imaging of breast: Secondary | ICD-10-CM | POA: Diagnosis present

## 2022-03-22 NOTE — Telephone Encounter (Signed)
PA approved via [facility]. Atuh# [id number], DOS: Apr 04, 2022 - Jul 03, 2022

## 2022-03-23 ENCOUNTER — Ambulatory Visit (INDEPENDENT_AMBULATORY_CARE_PROVIDER_SITE_OTHER): Payer: Medicare Other | Admitting: Family Medicine

## 2022-03-23 ENCOUNTER — Encounter: Payer: Self-pay | Admitting: Family Medicine

## 2022-03-23 VITALS — BP 128/85 | HR 72 | Ht 63.0 in | Wt 162.1 lb

## 2022-03-23 DIAGNOSIS — E559 Vitamin D deficiency, unspecified: Secondary | ICD-10-CM | POA: Insufficient documentation

## 2022-03-23 DIAGNOSIS — I10 Essential (primary) hypertension: Secondary | ICD-10-CM

## 2022-03-23 DIAGNOSIS — R7303 Prediabetes: Secondary | ICD-10-CM

## 2022-03-23 DIAGNOSIS — E785 Hyperlipidemia, unspecified: Secondary | ICD-10-CM

## 2022-03-23 DIAGNOSIS — E663 Overweight: Secondary | ICD-10-CM | POA: Diagnosis not present

## 2022-03-23 NOTE — Assessment & Plan Note (Signed)
  Patient re-educated about  the importance of commitment to a  minimum of 150 minutes of exercise per week as able.  The importance of healthy food choices with portion control discussed, as well as eating regularly and within a 12 hour window most days. The need to choose "clean , green" food 50 to 75% of the time is discussed, as well as to make water the primary drink and set a goal of 64 ounces water daily.       03/23/2022    8:07 AM 07/21/2021    8:07 AM 12/09/2020    8:04 AM  Weight /BMI  Weight 162 lb 1.3 oz 161 lb 1.9 oz 157 lb  Height '5\' 3"'$  (1.6 m) '5\' 3"'$  (1.6 m) '5\' 3"'$  (1.6 m)  BMI 28.71 kg/m2 28.54 kg/m2 27.81 kg/m2

## 2022-03-23 NOTE — Assessment & Plan Note (Signed)
Controlled, no change in medication DASH diet and commitment to daily physical activity for a minimum of 30 minutes discussed and encouraged, as a part of hypertension management. The importance of attaining a healthy weight is also discussed.     03/23/2022    8:07 AM 07/21/2021    8:07 AM 12/09/2020    8:04 AM 07/08/2020    9:35 AM 07/08/2020    8:38 AM 06/16/2020    9:04 AM 01/27/2020    1:12 PM  BP/Weight  Systolic BP 539 672 897 915 041  364  Diastolic BP 85 79 86 70 80  84  Wt. (Lbs) 162.08 161.12 157  157 157 168  BMI 28.71 kg/m2 28.54 kg/m2 27.81 kg/m2  26.13 kg/m2 26.13 kg/m2 27.96 kg/m2

## 2022-03-23 NOTE — Assessment & Plan Note (Signed)
Hyperlipidemia:Low fat diet discussed and encouraged.   Lipid Panel  Lab Results  Component Value Date   CHOL 218 (H) 12/09/2020   HDL 92 12/09/2020   LDLCALC 114 (H) 12/09/2020   TRIG 69 12/09/2020   CHOLHDL 2.4 12/09/2020     Needs to update today, elevated LDL at last check

## 2022-03-23 NOTE — Patient Instructions (Addendum)
Annual exam May 8 or after, call if you need me sooner  Labs today, CBC, lipid, cmp and EGFr, HBA1C , vit D today    It is important that you exercise regularly at least 30 minutes 5 times a week. If you develop chest pain, have severe difficulty breathing, or feel very tired, stop exercising immediately and seek medical attention   You need Covid vaccine and RSV  Thanks for choosing [facility], we consider it a privelige to serve you.

## 2022-03-23 NOTE — Progress Notes (Signed)
Christina Carroll     MRN: [id number]      DOB: 19-Apr-1945   HPI [name] is here for follow up and re-evaluation of chronic medical conditions, medication management and review of any available recent lab and radiology data.  Preventive health is updated, specifically  Cancer screening and Immunization.   Questions or concerns regarding consultations or procedures which the PT has had in the interim are  addressed. The PT denies any adverse reactions to current medications since the last visit.  There are no new concerns.  There are no specific complaints , occasional back pain Still npo exercise commitment  ROS Denies recent fever or chills. Denies sinus pressure, nasal congestion, ear pain or sore throat. Denies chest congestion, productive cough or wheezing. Denies chest pains, palpitations and leg swelling Denies abdominal pain, nausea, vomiting,diarrhea or constipation.   Denies dysuria, frequency, hesitancy or incontinence. . Denies headaches, seizures, numbness, or tingling. Denies depression, anxiety or insomnia. Denies skin break down or rash.   PE  BP 128/85 (BP Location: Right Arm, Patient Position: Sitting, Cuff Size: Normal)   Pulse 72   Ht '5\' 3"'$  (1.6 m)   Wt 162 lb 1.3 oz (73.5 kg)   SpO2 98%   BMI 28.71 kg/m   Patient alert and oriented and in no cardiopulmonary distress.  HEENT: No facial asymmetry, EOMI,     Neck supple .  Chest: Clear to auscultation bilaterally.  CVS: S1, S2 no murmurs, no S3.Regular rate.  ABD: Soft non tender.   Ext: No edema  MS: Adequate ROM spine, shoulders, hips and knees.  Skin: Intact, no ulcerations or rash noted.  Psych: Good eye contact, normal affect. Memory intact not anxious or depressed appearing.  CNS: CN 2-12 intact, power,  normal throughout.no focal deficits noted.   Assessment & Plan  Essential hypertension Controlled, no change in medication DASH diet and commitment to daily physical  activity for a minimum of 30 minutes discussed and encouraged, as a part of hypertension management. The importance of attaining a healthy weight is also discussed.     03/23/2022    8:07 AM 07/21/2021    8:07 AM 12/09/2020    8:04 AM 07/08/2020    9:35 AM 07/08/2020    8:38 AM 06/16/2020    9:04 AM 01/27/2020    1:12 PM  BP/Weight  Systolic BP 824 235 361 443 154  008  Diastolic BP 85 79 86 70 80  84  Wt. (Lbs) 162.08 161.12 157  157 157 168  BMI 28.71 kg/m2 28.54 kg/m2 27.81 kg/m2  26.13 kg/m2 26.13 kg/m2 27.96 kg/m2       Hyperlipidemia Hyperlipidemia:Low fat diet discussed and encouraged.   Lipid Panel  Lab Results  Component Value Date   CHOL 218 (H) 12/09/2020   HDL 92 12/09/2020   LDLCALC 114 (H) 12/09/2020   TRIG 69 12/09/2020   CHOLHDL 2.4 12/09/2020     Needs to update today, elevated LDL at last check  Overweight (BMI 25.0-29.9)  Patient re-educated about  the importance of commitment to a  minimum of 150 minutes of exercise per week as able.  The importance of healthy food choices with portion control discussed, as well as eating regularly and within a 12 hour window most days. The need to choose "clean , green" food 50 to 75% of the time is discussed, as well as to make water the primary drink and set a goal of 64 ounces water daily.  03/23/2022    8:07 AM 07/21/2021    8:07 AM 12/09/2020    8:04 AM  Weight /BMI  Weight 162 lb 1.3 oz 161 lb 1.9 oz 157 lb  Height '5\' 3"'$  (1.6 m) '5\' 3"'$  (1.6 m) '5\' 3"'$  (1.6 m)  BMI 28.71 kg/m2 28.54 kg/m2 27.81 kg/m2      Prediabetes Updated lab needed at/ before next visit. Patient educated about the importance of limiting  Carbohydrate intake , the need to commit to daily physical activity for a minimum of 30 minutes , and to commit weight loss. The fact that changes in all these areas will reduce or eliminate all together the development of diabetes is stressed.      Latest Ref Rng & Units 12/09/2020    8:44 AM  05/07/2019    8:34 AM 11/06/2018   11:00 AM 03/27/2018   10:33 AM 10/10/2017    7:08 AM  Diabetic Labs  HbA1c 4.8 - 5.6 % 5.7  5.9  5.8  5.9  5.8   Chol 100 - 199 mg/dL 218  205  207  197  210   HDL >39 mg/dL 92  72  74  63  74   Calc LDL 0 - 99 mg/dL 114  115  116  115  120   Triglycerides 0 - 149 mg/dL 69  84  71  88  72   Creatinine 0.57 - 1.00 mg/dL 1.09  1.03  1.00  0.91  0.91       03/23/2022    8:07 AM 07/21/2021    8:07 AM 12/09/2020    8:04 AM 07/08/2020    9:35 AM 07/08/2020    8:38 AM 06/16/2020    9:04 AM 01/27/2020    1:12 PM  BP/Weight  Systolic BP 802 233 612 244 975  300  Diastolic BP 85 79 86 70 80  84  Wt. (Lbs) 162.08 161.12 157  157 157 168  BMI 28.71 kg/m2 28.54 kg/m2 27.81 kg/m2  26.13 kg/m2 26.13 kg/m2 27.96 kg/m2       No data to display            Vitamin D deficiency Updated lab needed

## 2022-03-23 NOTE — Assessment & Plan Note (Signed)
Updated lab needed at/ before next visit. Patient educated about the importance of limiting  Carbohydrate intake , the need to commit to daily physical activity for a minimum of 30 minutes , and to commit weight loss. The fact that changes in all these areas will reduce or eliminate all together the development of diabetes is stressed.      Latest Ref Rng & Units 12/09/2020    8:44 AM 05/07/2019    8:34 AM 11/06/2018   11:00 AM 03/27/2018   10:33 AM 10/10/2017    7:08 AM  Diabetic Labs  HbA1c 4.8 - 5.6 % 5.7  5.9  5.8  5.9  5.8   Chol 100 - 199 mg/dL 218  205  207  197  210   HDL >39 mg/dL 92  72  74  63  74   Calc LDL 0 - 99 mg/dL 114  115  116  115  120   Triglycerides 0 - 149 mg/dL 69  84  71  88  72   Creatinine 0.57 - 1.00 mg/dL 1.09  1.03  1.00  0.91  0.91       03/23/2022    8:07 AM 07/21/2021    8:07 AM 12/09/2020    8:04 AM 07/08/2020    9:35 AM 07/08/2020    8:38 AM 06/16/2020    9:04 AM 01/27/2020    1:12 PM  BP/Weight  Systolic BP 409 811 914 782 956  213  Diastolic BP 85 79 86 70 80  84  Wt. (Lbs) 162.08 161.12 157  157 157 168  BMI 28.71 kg/m2 28.54 kg/m2 27.81 kg/m2  26.13 kg/m2 26.13 kg/m2 27.96 kg/m2       No data to display

## 2022-03-23 NOTE — Assessment & Plan Note (Signed)
Updated lab needed.  

## 2022-03-24 LAB — LIPID PANEL
Chol/HDL Ratio: 2.5 ratio (ref 0.0–4.4)
Cholesterol, Total: 217 mg/dL — ABNORMAL HIGH (ref 100–199)
HDL: 86 mg/dL (ref >39)
LDL Chol Calc (NIH): 120 mg/dL — ABNORMAL HIGH (ref 0–99)
Triglycerides: 61 mg/dL (ref 0–149)
VLDL Cholesterol Cal: 11 mg/dL (ref 5–40)

## 2022-03-24 LAB — CMP14+EGFR
ALT: 23 IU/L (ref 0–32)
AST: 25 IU/L (ref 0–40)
Albumin/Globulin Ratio: 1.7 (ref 1.2–2.2)
Albumin: 4.5 g/dL (ref 3.8–4.8)
Alkaline Phosphatase: 99 IU/L (ref 44–121)
BUN/Creatinine Ratio: 16 (ref 12–28)
BUN: 17 mg/dL (ref 8–27)
Bilirubin Total: 0.7 mg/dL (ref 0.0–1.2)
CO2: 27 mmol/L (ref 20–29)
Calcium: 10.1 mg/dL (ref 8.7–10.3)
Chloride: 98 mmol/L (ref 96–106)
Creatinine, Ser: 1.08 mg/dL — ABNORMAL HIGH (ref 0.57–1.00)
Globulin, Total: 2.7 g/dL (ref 1.5–4.5)
Glucose: 87 mg/dL (ref 70–99)
Potassium: 4.2 mmol/L (ref 3.5–5.2)
Sodium: 140 mmol/L (ref 134–144)
Total Protein: 7.2 g/dL (ref 6.0–8.5)
eGFR: 54 mL/min/{1.73_m2} — ABNORMAL LOW (ref >59)

## 2022-03-24 LAB — CBC
Hematocrit: 41.2 % (ref 34.0–46.6)
Hemoglobin: 13.2 g/dL (ref 11.1–15.9)
MCH: 26.7 pg (ref 26.6–33.0)
MCHC: 32 g/dL (ref 31.5–35.7)
MCV: 83 fL (ref 79–97)
Platelets: 354 10*3/uL (ref 150–450)
RBC: 4.95 x10E6/uL (ref 3.77–5.28)
RDW: 14.1 % (ref 11.7–15.4)
WBC: 5.5 10*3/uL (ref 3.4–10.8)

## 2022-03-24 LAB — HEMOGLOBIN A1C
Est. average glucose Bld gHb Est-mCnc: 123 mg/dL
Hgb A1c MFr Bld: 5.9 % — ABNORMAL HIGH (ref 4.8–5.6)

## 2022-03-24 LAB — VITAMIN D 25 HYDROXY (VIT D DEFICIENCY, FRACTURES): Vit D, 25-Hydroxy: 53.6 ng/mL (ref 30.0–100.0)

## 2022-03-24 NOTE — Addendum Note (Signed)
Addended by: Smitty Knudsen on: 03/24/2022 08:46 AM   Modules accepted: Orders

## 2022-03-27 NOTE — Patient Instructions (Signed)
Christina Carroll  03/27/2022     '@PREFPERIOPPHARMACY'$ @   Your procedure is scheduled on  04/04/2022.   Report to [facility] at  1145  A.M.   Call this number if you have problems the morning of surgery:  [phone]  If you experience any cold or flu symptoms such as cough, fever, chills, shortness of breath, etc. between now and your scheduled surgery, please notify us at the above number.   Remember:  Follow the diet and prep instructions given to you by the office.     Take these medicines the morning of surgery with A SIP OF WATER                           Nifedipine, unithroid.     Do not wear jewelry, make-up or nail polish.  Do not wear lotions, powders, or perfumes, or deodorant.  Do not shave 48 hours prior to surgery.  Men may shave face and neck.  Do not bring valuables to the hospital.  [facility] is not responsible for any belongings or valuables.  Contacts, dentures or bridgework may not be worn into surgery.  Leave your suitcase in the car.  After surgery it may be brought to your room.  For patients admitted to the hospital, discharge time will be determined by your treatment team.  Patients discharged the day of surgery will not be allowed to drive home and must have someone with them for 24 hours.    Special instructions:   DO NOT smoke tobacco or vape for 24 hours before your procedure.  Please read over the following fact sheets that you were given. Anesthesia Post-op Instructions and Care and Recovery After Surgery      Colonoscopy, Adult, Care After The following information offers guidance on how to care for yourself after your procedure. Your health care provider may also give you more specific instructions. If you have problems or questions, contact your health care provider. What can I expect after the procedure? After the procedure, it is common to have: A small amount of blood in your stool for 24 hours after the  procedure. Some gas. Mild cramping or bloating of your abdomen. Follow these instructions at home: Eating and drinking  Drink enough fluid to keep your urine pale yellow. Follow instructions from your health care provider about eating or drinking restrictions. Resume your normal diet as told by your health care provider. Avoid heavy or fried foods that are hard to digest. Activity Rest as told by your health care provider. Avoid sitting for a long time without moving. Get up to take short walks every 1-2 hours. This is important to improve blood flow and breathing. Ask for help if you feel weak or unsteady. Return to your normal activities as told by your health care provider. Ask your health care provider what activities are safe for you. Managing cramping and bloating  Try walking around when you have cramps or feel bloated. If directed, apply heat to your abdomen as told by your health care provider. Use the heat source that your health care provider recommends, such as a moist heat pack or a heating pad. Place a towel between your skin and the heat source. Leave the heat on for 20-30 minutes. Remove the heat if your skin turns bright red. This is especially important if you are unable to feel pain, heat, or cold. You have a greater  risk of getting burned. General instructions If you were given a sedative during the procedure, it can affect you for several hours. Do not drive or operate machinery until your health care provider says that it is safe. For the first 24 hours after the procedure: Do not sign important documents. Do not drink alcohol. Do your regular daily activities at a slower pace than normal. Eat soft foods that are easy to digest. Take over-the-counter and prescription medicines only as told by your health care provider. Keep all follow-up visits. This is important. Contact a health care provider if: You have blood in your stool 2-3 days after the procedure. Get  help right away if: You have more than a small spotting of blood in your stool. You have large blood clots in your stool. You have swelling of your abdomen. You have nausea or vomiting. You have a fever. You have increasing pain in your abdomen that is not relieved with medicine. These symptoms may be an emergency. Get help right away. Call 911. Do not wait to see if the symptoms will go away. Do not drive yourself to the hospital. Summary After the procedure, it is common to have a small amount of blood in your stool. You may also have mild cramping and bloating of your abdomen. If you were given a sedative during the procedure, it can affect you for several hours. Do not drive or operate machinery until your health care provider says that it is safe. Get help right away if you have a lot of blood in your stool, nausea or vomiting, a fever, or increased pain in your abdomen. This information is not intended to replace advice given to you by your health care provider. Make sure you discuss any questions you have with your health care provider. Document Revised: 10/27/2020 Document Reviewed: 10/27/2020 Elsevier Patient Education  [place] After The following information offers guidance on how to care for yourself after your procedure. Your health care provider may also give you more specific instructions. If you have problems or questions, contact your health care provider. What can I expect after the procedure? After the procedure, it is common to have: Tiredness. Little or no memory about what happened during or after the procedure. Impaired judgment when it comes to making decisions. Nausea or vomiting. Some trouble with balance. Follow these instructions at home: For the time period you were told by your health care provider:  Rest. Do not participate in activities where you could fall or become injured. Do not drive or use machinery. Do  not drink alcohol. Do not take sleeping pills or medicines that cause drowsiness. Do not make important decisions or sign legal documents. Do not take care of children on your own. Medicines Take over-the-counter and prescription medicines only as told by your health care provider. If you were prescribed antibiotics, take them as told by your health care provider. Do not stop using the antibiotic even if you start to feel better. Eating and drinking Follow instructions from your health care provider about what you may eat and drink. Drink enough fluid to keep your urine pale yellow. If you vomit: Drink clear fluids slowly and in small amounts as you are able. Clear fluids include water, ice chips, low-calorie sports drinks, and fruit juice that has water added to it (diluted fruit juice). Eat light and bland foods in small amounts as you are able. These foods include bananas, applesauce, rice, lean meats, toast,  and crackers. General instructions  Have a responsible adult stay with you for the time you are told. It is important to have someone help care for you until you are awake and alert. If you have sleep apnea, surgery and some medicines can increase your risk for breathing problems. Follow instructions from your health care provider about wearing your sleep device: When you are sleeping. This includes during daytime naps. While taking prescription pain medicines, sleeping medicines, or medicines that make you drowsy. Do not use any products that contain nicotine or tobacco. These products include cigarettes, chewing tobacco, and vaping devices, such as e-cigarettes. If you need help quitting, ask your health care provider. Contact a health care provider if: You feel nauseous or vomit every time you eat or drink. You feel light-headed. You are still sleepy or having trouble with balance after 24 hours. You get a rash. You have a fever. You have redness or swelling around the IV  site. Get help right away if: You have trouble breathing. You have new confusion after you get home. These symptoms may be an emergency. Get help right away. Call 911. Do not wait to see if the symptoms will go away. Do not drive yourself to the hospital. This information is not intended to replace advice given to you by your health care provider. Make sure you discuss any questions you have with your health care provider. Document Revised: 08/01/2021 Document Reviewed: 08/01/2021 Elsevier Patient Education  [place].

## 2022-03-30 ENCOUNTER — Encounter (HOSPITAL_COMMUNITY)
Admission: RE | Admit: 2022-03-30 | Discharge: 2022-03-30 | Disposition: A | Discharge status: Home / Self Care | Payer: Medicare Other | Source: Ambulatory Visit | Attending: Gastroenterology | Admitting: Gastroenterology

## 2022-03-30 ENCOUNTER — Encounter (HOSPITAL_COMMUNITY): Payer: Self-pay

## 2022-03-30 VITALS — BP 126/71 | HR 99 | Temp 98.4°F | Resp 18 | Ht 63.0 in | Wt 162.1 lb

## 2022-03-30 DIAGNOSIS — Z01810 Encounter for preprocedural cardiovascular examination: Secondary | ICD-10-CM | POA: Diagnosis present

## 2022-03-30 DIAGNOSIS — Z6825 Body mass index (BMI) 25.0-25.9, adult: Secondary | ICD-10-CM | POA: Diagnosis not present

## 2022-03-30 DIAGNOSIS — I10 Essential (primary) hypertension: Secondary | ICD-10-CM | POA: Diagnosis not present

## 2022-03-30 DIAGNOSIS — E663 Overweight: Secondary | ICD-10-CM | POA: Diagnosis not present

## 2022-03-30 HISTORY — DX: Hypothyroidism, unspecified: E03.9

## 2022-03-31 NOTE — OR Nursing (Signed)
Called said she started  feeling like she developing a cold. Started allergy med. And wanted to let us now. Still planning to have procedure on tues unless she gets worse.

## 2022-04-04 ENCOUNTER — Other Ambulatory Visit: Payer: Self-pay

## 2022-04-04 ENCOUNTER — Ambulatory Visit (HOSPITAL_BASED_OUTPATIENT_CLINIC_OR_DEPARTMENT_OTHER): Payer: Medicare Other | Admitting: Anesthesiology

## 2022-04-04 ENCOUNTER — Ambulatory Visit (HOSPITAL_COMMUNITY): Payer: Medicare Other | Admitting: Anesthesiology

## 2022-04-04 ENCOUNTER — Encounter (HOSPITAL_COMMUNITY): Payer: Self-pay | Admitting: Gastroenterology

## 2022-04-04 ENCOUNTER — Ambulatory Visit (HOSPITAL_COMMUNITY)
Admission: RE | Admit: 2022-04-04 | Discharge: 2022-04-04 | Disposition: A | Discharge status: Home / Self Care | Payer: Medicare Other | Attending: Gastroenterology | Admitting: Gastroenterology

## 2022-04-04 ENCOUNTER — Encounter (HOSPITAL_COMMUNITY)
Admission: RE | Disposition: A | Discharge status: Home / Self Care | Payer: Self-pay | Source: Home / Self Care | Attending: Gastroenterology

## 2022-04-04 ENCOUNTER — Encounter (INDEPENDENT_AMBULATORY_CARE_PROVIDER_SITE_OTHER): Payer: Self-pay | Admitting: *Deleted

## 2022-04-04 DIAGNOSIS — E669 Obesity, unspecified: Secondary | ICD-10-CM | POA: Diagnosis not present

## 2022-04-04 DIAGNOSIS — E785 Hyperlipidemia, unspecified: Secondary | ICD-10-CM | POA: Insufficient documentation

## 2022-04-04 DIAGNOSIS — Z1211 Encounter for screening for malignant neoplasm of colon: Secondary | ICD-10-CM

## 2022-04-04 DIAGNOSIS — R7303 Prediabetes: Secondary | ICD-10-CM | POA: Diagnosis not present

## 2022-04-04 DIAGNOSIS — D123 Benign neoplasm of transverse colon: Secondary | ICD-10-CM | POA: Insufficient documentation

## 2022-04-04 DIAGNOSIS — G709 Myoneural disorder, unspecified: Secondary | ICD-10-CM | POA: Diagnosis not present

## 2022-04-04 DIAGNOSIS — K635 Polyp of colon: Secondary | ICD-10-CM | POA: Diagnosis not present

## 2022-04-04 DIAGNOSIS — I10 Essential (primary) hypertension: Secondary | ICD-10-CM | POA: Diagnosis not present

## 2022-04-04 DIAGNOSIS — K621 Rectal polyp: Secondary | ICD-10-CM | POA: Insufficient documentation

## 2022-04-04 DIAGNOSIS — E039 Hypothyroidism, unspecified: Secondary | ICD-10-CM | POA: Insufficient documentation

## 2022-04-04 DIAGNOSIS — K648 Other hemorrhoids: Secondary | ICD-10-CM | POA: Diagnosis not present

## 2022-04-04 DIAGNOSIS — D122 Benign neoplasm of ascending colon: Secondary | ICD-10-CM | POA: Insufficient documentation

## 2022-04-04 DIAGNOSIS — K5730 Diverticulosis of large intestine without perforation or abscess without bleeding: Secondary | ICD-10-CM

## 2022-04-04 DIAGNOSIS — Z833 Family history of diabetes mellitus: Secondary | ICD-10-CM | POA: Diagnosis not present

## 2022-04-04 DIAGNOSIS — Z8249 Family history of ischemic heart disease and other diseases of the circulatory system: Secondary | ICD-10-CM | POA: Diagnosis not present

## 2022-04-04 DIAGNOSIS — E663 Overweight: Secondary | ICD-10-CM

## 2022-04-04 HISTORY — PX: COLONOSCOPY WITH PROPOFOL: SHX5780

## 2022-04-04 HISTORY — PX: POLYPECTOMY: SHX149

## 2022-04-04 LAB — GLUCOSE, CAPILLARY: Glucose-Capillary: 94 mg/dL (ref 70–99)

## 2022-04-04 LAB — HM COLONOSCOPY

## 2022-04-04 SURGERY — COLONOSCOPY WITH PROPOFOL
Anesthesia: General

## 2022-04-04 MED ORDER — LACTATED RINGERS IV SOLN: INTRAVENOUS | Status: DC

## 2022-04-04 MED ORDER — PROPOFOL 10 MG/ML IV BOLUS
INTRAVENOUS | Status: DC | PRN
  Administered 2022-04-04: 50 mg via INTRAVENOUS
  Administered 2022-04-04: 100 mg via INTRAVENOUS
  Administered 2022-04-04: 50 mg via INTRAVENOUS

## 2022-04-04 MED ORDER — LIDOCAINE HCL (CARDIAC) PF 50 MG/5ML IV SOSY
PREFILLED_SYRINGE | INTRAVENOUS | Status: DC | PRN
  Administered 2022-04-04: 50 mg via INTRAVENOUS

## 2022-04-04 NOTE — Discharge Instructions (Signed)
You are being discharged home Resume your previous diet We are waiting for your pathology results Your physician has recommended a repeat colonoscopy - timeframe to be determined once pathology has been reviewed.  

## 2022-04-04 NOTE — Anesthesia Preprocedure Evaluation (Signed)
Anesthesia Evaluation  Patient identified by MRN, date of birth, ID band Patient awake    Reviewed: Allergy & Precautions, H&P , NPO status , Patient's Chart, lab work & pertinent test results, reviewed documented beta blocker date and time   Airway Mallampati: II  TM Distance: >3 FB Neck ROM: full    Dental no notable dental hx.    Pulmonary neg pulmonary ROS   Pulmonary exam normal breath sounds clear to auscultation       Cardiovascular Exercise Tolerance: Good hypertension, negative cardio ROS  Rhythm:regular Rate:Normal     Neuro/Psych  Neuromuscular disease negative neurological ROS  negative psych ROS   GI/Hepatic negative GI ROS, Neg liver ROS,,,  Endo/Other  negative endocrine ROSHypothyroidism    Renal/GU negative Renal ROS  negative genitourinary   Musculoskeletal   Abdominal   Peds  Hematology negative hematology ROS (+)   Anesthesia Other Findings   Reproductive/Obstetrics negative OB ROS                             Anesthesia Physical Anesthesia Plan  ASA: 2  Anesthesia Plan: General   Post-op Pain Management:    Induction:   PONV Risk Score and Plan: Propofol infusion  Airway Management Planned:   Additional Equipment:   Intra-op Plan:   Post-operative Plan:   Informed Consent: I have reviewed the patients History and Physical, chart, labs and discussed the procedure including the risks, benefits and alternatives for the proposed anesthesia with the patient or authorized representative who has indicated his/her understanding and acceptance.     Dental Advisory Given  Plan Discussed with: CRNA  Anesthesia Plan Comments:        Anesthesia Quick Evaluation

## 2022-04-04 NOTE — Anesthesia Procedure Notes (Signed)
Date/Time: 04/04/2022 11:38 AM  Performed by: Vista Deck, CRNAPre-anesthesia Checklist: Patient identified, Emergency Drugs available, Suction available, Timeout performed and Patient being monitored Patient Re-evaluated:Patient Re-evaluated prior to induction Oxygen Delivery Method: Nasal Cannula

## 2022-04-04 NOTE — Transfer of Care (Signed)
Immediate Anesthesia Transfer of Care Note  Patient: Christina Carroll  Procedure(s) Performed: COLONOSCOPY WITH PROPOFOL POLYPECTOMY INTESTINAL  Patient Location: Short Stay  Anesthesia Type:General  Level of Consciousness: awake and patient cooperative  Airway & Oxygen Therapy: Patient Spontanous Breathing  Post-op Assessment: Report given to RN and Post -op Vital signs reviewed and stable  Post vital signs: Reviewed and stable  Last Vitals:  Vitals Value Taken Time  BP 113/61 04/04/22 1210  Temp 36.4 C 04/04/22 1210  Pulse 73 04/04/22 1210  Resp 17 04/04/22 1210  SpO2 97 % 04/04/22 1210    Last Pain:  Vitals:   04/04/22 1210  TempSrc: Oral  PainSc: 0-No pain      Patients Stated Pain Goal: 10 (81/85/90 9311)  Complications: No notable events documented.

## 2022-04-04 NOTE — Op Note (Signed)
[facility] Patient Name: Christina Carroll Procedure Date: 04/04/2022 11:21 AM MRN: [id number] Date of Birth: 26-Nov-1945 Attending MD: Maylon Peppers , , [id number] CSN: [id number] Age: 77 Admit Type: Outpatient Procedure:                Colonoscopy Indications:              Screening for colorectal malignant neoplasm Providers:                Maylon Peppers, Caprice Kluver, Casimer Bilis,                            Technician Referring MD:              Medicines:                Monitored Anesthesia Care Complications:            No immediate complications. Estimated Blood Loss:     Estimated blood loss: none. Procedure:                Pre-Anesthesia Assessment:                           - Prior to the procedure, a History and Physical                            was performed, and patient medications, allergies                            and sensitivities were reviewed. The patient's                            tolerance of previous anesthesia was reviewed.                           - The risks and benefits of the procedure and the                            sedation options and risks were discussed with the                            patient. All questions were answered and informed                            consent was obtained.                           - ASA Grade Assessment: II - A patient with mild                            systemic disease.                           After obtaining informed consent, the colonoscope                            was passed under direct vision. Throughout the  procedure, the patient's blood pressure, pulse, and                            oxygen saturations were monitored continuously. The                            [id number]-HQ190L ([id number]) scope was introduced through                            the anus and advanced to the the cecum, identified                            by appendiceal orifice and ileocecal  valve. The                            colonoscopy was performed without difficulty. The                            patient tolerated the procedure well. The quality                            of the bowel preparation was good. Scope In: 11:44:26 AM Scope Out: 82:42:35 PM Scope Withdrawal Time: 0 hours 16 minutes 30 seconds  Total Procedure Duration: 0 hours 22 minutes 12 seconds  Findings:      The perianal and digital rectal examinations were normal.      Three sessile polyps were found in the ascending colon. The polyps were       3 to 4 mm in size. These polyps were removed with a cold snare.       Resection and retrieval were complete.      Two sessile polyps were found in the rectum and transverse colon. The       polyps were 1 mm in size. These polyps were removed with a cold biopsy       forceps. Resection and retrieval were complete.      Scattered medium-mouthed diverticula were found in the sigmoid colon.      Non-bleeding internal hemorrhoids were found during retroflexion. The       hemorrhoids were small. Impression:               - Three 3 to 4 mm polyps in the ascending colon,                            removed with a cold snare. Resected and retrieved.                           - Two 1 mm polyps in the rectum and in the                            transverse colon, removed with a cold biopsy                            forceps. Resected and retrieved.                           -  Diverticulosis in the sigmoid colon.                           - Non-bleeding internal hemorrhoids. Moderate Sedation:      Per Anesthesia Care Recommendation:           - Discharge patient to home (ambulatory).                           - Resume previous diet.                           - Await pathology results.                           - Repeat colonoscopy for surveillance based on                            pathology results. Procedure Code(s):        --- Professional ---                            [phone], Colonoscopy, flexible; with removal of                            tumor(s), polyp(s), or other lesion(s) by snare                            technique                           45380, 57, Colonoscopy, flexible; with biopsy,                            single or multiple Diagnosis Code(s):        --- Professional ---                           Z12.11, Encounter for screening for malignant                            neoplasm of colon                           D12.2, Benign neoplasm of ascending colon                           D12.8, Benign neoplasm of rectum                           D12.3, Benign neoplasm of transverse colon (hepatic                            flexure or splenic flexure)                           K64.8, Other hemorrhoids  K57.30, Diverticulosis of large intestine without                            perforation or abscess without bleeding CPT copyright 2022 American Medical Association. All rights reserved. The codes documented in this report are preliminary and upon coder review may  be revised to meet current compliance requirements. Maylon Peppers, MD Maylon Peppers,  04/04/2022 12:15:24 PM This report has been signed electronically. Number of Addenda: 0

## 2022-04-04 NOTE — H&P (Signed)
Christina Carroll is an 77 y.o. female.   Chief Complaint: CRC screening HPI: 77 y/o F with Pmh HTH, hyperlipidemia, hypertension, hypothyroidism, obesity, prediabetes, coming for screening colonoscopy.  Last colonoscopy was performed in 2013, no polyps were found.  The patient denies having any complaints such as melena, hematochezia, abdominal pain or distention, change in her bowel movement consistency or frequency, no changes in weight recently.  No family history of colorectal cancer.   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   TENDINITIS 02/02/2010   Qualifier: Diagnosis of  By: Moshe Cipro MD, Margaret     Thyroid disease    Phreesia 07/05/2020    Past Surgical History:  Procedure Laterality Date   Bilateral foot surgery bone removed  2000   COLONOSCOPY  11/30/2011   Procedure: COLONOSCOPY;  Surgeon: Rogene Houston, MD;  Location: [facility];  Service: Endoscopy;  Laterality: N/A;  830   Lumpectomy removed from thoracic spine area  1999    Family History  Problem Relation Age of Onset   Stroke Mother    Diabetes Mother    Hypertension Mother    Coronary artery disease Father    Arthritis Other    Cancer Other    Breast cancer Maternal Aunt    Hypertension Son    Social History:  reports that she has never smoked. She has never used smokeless tobacco. She reports that she does not drink alcohol and does not use drugs.  Allergies: No Known Allergies  Medications Prior to Admission  Medication Sig Dispense Refill   Calcium Carbonate-Vitamin D (OYSTER SHELL CALCIUM  500 + D PO) Take 1 tablet by mouth in the morning, at noon, and at bedtime.     LINIMENTS EX Apply 1 Application topically daily as needed (pain). Horse Liniment     NIFEdipine (PROCARDIA XL/NIFEDICAL XL) 60 MG 24 hr tablet TAKE 1 TABLET BY MOUTH  DAILY 90 tablet 3   pravastatin (PRAVACHOL) 80 MG tablet TAKE 1 TABLET BY MOUTH DAILY 90 tablet 3   triamterene-hydrochlorothiazide (MAXZIDE-25) 37.5-25 MG tablet TAKE 1 TABLET BY MOUTH  DAILY 90 tablet 3   UNITHROID 50 MCG tablet Take 50 mcg by mouth daily before breakfast.     Cholecalciferol (VITAMIN D) 50 MCG (2000 UT) CAPS Take 1 capsule (2,000 Units total) by mouth daily. (Patient not taking: Reported on 03/29/2022) 100 capsule 3    Results for orders placed or performed during the hospital encounter of 04/04/22 (from the past 48 hour(s))  Glucose, capillary     Status: None   Collection Time: 04/04/22 10:29 AM  Result Value Ref Range   Glucose-Capillary 94 70 - 99 mg/dL    Comment: Glucose reference range applies only to samples taken after fasting for at least 8 hours.   No results found.  Review of Systems  All other systems reviewed and are negative.   Pulse 73, temperature 98.5 F (36.9 C), temperature source Oral, resp. rate 16, SpO2 100 %. Physical Exam  GENERAL: The  patient is AO x3, in no acute distress. HEENT: Head is normocephalic and atraumatic. EOMI are intact. Mouth is well hydrated and without lesions. NECK: Supple. No masses LUNGS: Clear to auscultation. No presence of rhonchi/wheezing/rales. Adequate chest expansion HEART: RRR, normal s1 and s2. ABDOMEN: Soft, nontender, no guarding, no peritoneal signs, and nondistended. BS +. No masses. EXTREMITIES: Without any cyanosis, clubbing, rash, lesions or edema. NEUROLOGIC: AOx3, no focal motor deficit. SKIN: no jaundice, no rashes  Assessment/Plan 77 y/o F with Pmh HTH, hyperlipidemia, hypertension, hypothyroidism, obesity, prediabetes, coming for screening colonoscopy.   Will proceed with colonoscopy.  Harvel Quale, MD 04/04/2022, 11:25 AM

## 2022-04-05 LAB — SURGICAL PATHOLOGY

## 2022-04-05 NOTE — Anesthesia Postprocedure Evaluation (Signed)
Anesthesia Post Note  Patient: ULYSSA WALTHOUR  Procedure(s) Performed: COLONOSCOPY WITH PROPOFOL POLYPECTOMY INTESTINAL  Patient location during evaluation: Phase II Anesthesia Type: General Level of consciousness: awake Pain management: pain level controlled Vital Signs Assessment: post-procedure vital signs reviewed and stable Respiratory status: spontaneous breathing and respiratory function stable Cardiovascular status: blood pressure returned to baseline and stable Postop Assessment: no headache and no apparent nausea or vomiting Anesthetic complications: no Comments: Late entry   No notable events documented.   Last Vitals:  Vitals:   04/04/22 1038 04/04/22 1210  BP:  113/61  Pulse: 73 73  Resp: 16 17  Temp: 36.9 C 36.4 C  SpO2: 100% 97%    Last Pain:  Vitals:   04/04/22 1210  TempSrc: Oral  PainSc: 0-No pain                 Louann Sjogren

## 2022-04-10 ENCOUNTER — Encounter (HOSPITAL_COMMUNITY): Payer: Self-pay | Admitting: Gastroenterology

## 2022-04-18 ENCOUNTER — Other Ambulatory Visit: Payer: Self-pay | Admitting: Family Medicine

## 2022-07-03 ENCOUNTER — Ambulatory Visit (INDEPENDENT_AMBULATORY_CARE_PROVIDER_SITE_OTHER): Payer: Medicare Other | Admitting: Internal Medicine

## 2022-07-03 ENCOUNTER — Encounter: Payer: Self-pay | Admitting: Internal Medicine

## 2022-07-03 VITALS — BP 138/88 | Ht 63.0 in | Wt 159.0 lb

## 2022-07-03 DIAGNOSIS — H9193 Unspecified hearing loss, bilateral: Secondary | ICD-10-CM | POA: Diagnosis not present

## 2022-07-03 DIAGNOSIS — Z0000 Encounter for general adult medical examination without abnormal findings: Secondary | ICD-10-CM | POA: Diagnosis not present

## 2022-07-03 NOTE — Patient Instructions (Signed)
  Ms. Hillenbrand , Thank you for taking time to come for your Medicare Wellness Visit. I appreciate your ongoing commitment to your health goals. Please review the following plan we discussed and let me know if I can assist you in the future.   These are the goals we discussed:  Goals      Exercise 3x per week (30 min per time)     Patient would like to start exercising 3 times a week for at least 30 minutes at a time.      LIFESTYLE - DECREASE FALLS RISK     Has fallen a lot in her driveway and had her driveway re done with concrete so its smoother and less risk of falling      Patient Stated     Patient states that her goals in to live long enough to watch her grand baby grow up and to be as healthy as she can.        This is a list of the screening recommended for you and due dates:  Health Maintenance  Topic Date Due   COVID-19 Vaccine (7 - 2023-24 season) 12/09/2021   Flu Shot  10/19/2022   Medicare Annual Wellness Visit  07/03/2023   Mammogram  03/08/2024   Cologuard (Stool DNA test)  05/31/2024   DTaP/Tdap/Td vaccine (3 - Td or Tdap) 06/28/2024   Pneumonia Vaccine  Completed   DEXA scan (bone density measurement)  Completed   Hepatitis C Screening: USPSTF Recommendation to screen - Ages 26-79 yo.  Completed   Zoster (Shingles) Vaccine  Completed   HPV Vaccine  Aged Out   Colon Cancer Screening  Discontinued

## 2022-07-03 NOTE — Progress Notes (Signed)
Subjective:   Christina Carroll is a 77 y.o. female who presents for Medicare Annual (Subsequent) preventive examination.  Review of Systems    Review of Systems  All other systems reviewed and are negative.   Objective:    There were no vitals filed for this visit. There is no height or weight on file to calculate BMI.     07/03/2022    8:23 AM 04/04/2022   10:39 AM 03/30/2022   10:57 AM 06/20/2021    8:08 AM 06/16/2020    9:16 AM 06/16/2019   10:25 AM 06/04/2017    3:03 PM  Advanced Directives  Does Patient Have a Medical Advance Directive? No No No No Yes Yes Yes  Type of Primary school teacher of [employer] Power of Attorney  Copy of Healthcare Power of Attorney in Chart?     No - copy requested No - copy requested Yes  Would patient like information on creating a medical advance directive? Yes (MAU/Ambulatory/Procedural Areas - Information given) No - Patient declined No - Patient declined No - Patient declined       Current Medications (verified) Outpatient Encounter Medications as of 07/03/2022  Medication Sig   Calcium Carbonate-Vitamin D (OYSTER SHELL CALCIUM 500 + D PO) Take 1 tablet by mouth in the morning, at noon, and at bedtime.   Cholecalciferol (VITAMIN D) 50 MCG (2000 UT) CAPS Take 1 capsule (2,000 Units total) by mouth daily. (Patient not taking: Reported on 03/29/2022)   LINIMENTS EX Apply 1 Application topically daily as needed (pain). Horse Liniment   NIFEdipine (PROCARDIA XL/NIFEDICAL XL) 60 MG 24 hr tablet TAKE 1 TABLET BY MOUTH  DAILY   pravastatin (PRAVACHOL) 80 MG tablet TAKE 1 TABLET BY MOUTH DAILY   triamterene-hydrochlorothiazide (MAXZIDE-25) 37.5-25 MG tablet TAKE 1 TABLET BY MOUTH DAILY   UNITHROID 50 MCG tablet Take 50 mcg by mouth daily before breakfast.   No facility-administered encounter medications on file as of 07/03/2022.    Allergies (verified) Patient has no known allergies.    History: Past Medical History:  Diagnosis Date   Arthritis    BACK PAIN 02/02/2010   Qualifier: Diagnosis of  By: Lodema Hong MD, Margaret     Essential hypertension    Hyperlipidemia    Hypertension    Phreesia 07/05/2020   Hypothyroidism    Lymphocytic thyroiditis    Metabolic syndrome X 12/02/2013   NEVUS 05/12/2010   Qualifier: Diagnosis of  By: Lodema Hong MD, Margaret     Obesity    Prediabetes    Primary osteoarthritis, 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   TENDINITIS 02/02/2010   Qualifier: Diagnosis of  By: Lodema Hong MD, Margaret     Thyroid disease    Phreesia 07/05/2020   Past Surgical History:  Procedure Laterality Date   Bilateral foot surgery bone removed  2000   COLONOSCOPY  11/30/2011   Procedure: COLONOSCOPY;  Surgeon: Malissa Hippo, MD;  [facility];  Service: Endoscopy;  Laterality: N/A;  830   COLONOSCOPY WITH PROPOFOL N/A 04/04/2022   Procedure: COLONOSCOPY WITH PROPOFOL;  Surgeon: Dolores Frame, MD;  Location: [facility];  Service: Gastroenterology;  Laterality: N/A;  1:45pm, asa 3   Lumpectomy removed from thoracic spine area  1999   POLYPECTOMY  04/04/2022   Procedure: POLYPECTOMY INTESTINAL;  Surgeon: Dolores Frame, MD;  Location: [facility];  Service: Gastroenterology;;   Family History  Problem Relation Age of Onset  Stroke Mother    Diabetes Mother    Hypertension Mother    Coronary artery disease Father    Arthritis Other    Cancer Other    Breast cancer Maternal Aunt    Hypertension Son    Social History   Socioeconomic History   Marital status: Divorced    Spouse name: Not on file   Number of children: 1   Years of education: Not on file   Highest education level: Not on file  Occupational History   Occupation: Employed   Tobacco Use   Smoking status: Never   Smokeless tobacco: Never  Substance and Sexual Activity   Alcohol use: No    Alcohol/week: 0.0 standard drinks of alcohol   Drug use: No    Sexual activity: Not Currently  Other Topics Concern   Not on file  Social History Narrative   Not on file   Social Determinants of Health   Financial Resource Strain: Low Risk  (07/02/2022)   Overall Financial Resource Strain (CARDIA)    Difficulty of Paying Living Expenses: Not hard at all  Food Insecurity: No Food Insecurity (07/02/2022)   Hunger Vital Sign    Worried About Running Out of Food in the Last Year: Never true    Ran Out of Food in the Last Year: Never true  Transportation Needs: No Transportation Needs (07/02/2022)   PRAPARE - Administrator, Civil Service (Medical): No    Lack of Transportation (Non-Medical): No  Physical Activity: Unknown (07/02/2022)   Exercise Vital Sign    Days of Exercise per Week: Patient declined    Minutes of Exercise per Session: Not on file  Stress: No Stress Concern Present (07/02/2022)   [employer] of Occupational Health - Occupational Stress Questionnaire    Feeling of Stress : Only a little  Social Connections: Unknown (07/02/2022)   Social Connection and Isolation Panel [NHANES]    Frequency of Communication with Friends and Family: More than three times a week    Frequency of Social Gatherings with Friends and Family: More than three times a week    Attends Religious Services: Not on Marketing executive or Organizations: Yes    Attends Engineer, structural: More than 4 times per year    Marital Status: Divorced    Tobacco Counseling Counseling given: Not Answered   Clinical Intake:  Pre-visit preparation completed: Yes  Pain : No/denies pain     Diabetes: No  How often do you need to have someone help you when you read instructions, pamphlets, or other written materials from your doctor or pharmacy?: 1 - Never What is the last grade level you completed in school?: 4 years college     Activities of Daily Living    07/03/2022    8:31 AM 07/02/2022    9:17 AM  In your present  state of health, do you have any difficulty performing the following activities:  Hearing? 0 0  Vision? 0 0  Difficulty concentrating or making decisions? 0 0  Walking or climbing stairs? 0 0  Dressing or bathing? 0 0  Doing errands, shopping? 0 0  Preparing Food and eating ?  N  Using the Toilet?  N  In the past six months, have you accidently leaked urine?  N  Do you have problems with loss of bowel control?  N  Managing your Medications?  N  Managing your Finances?  N  Housekeeping or  managing your Housekeeping?  N    Patient Care Team: Kerri Perches, MD as PCP - General Morayati, Delsa Sale, MD as Attending Physician (Endocrinology) Eldred Manges, MD as Consulting Physician (Orthopedic Surgery)  Indicate any recent Medical Services you may have received from other than [facility] providers in the past year (date may be approximate).     Assessment:   This is a routine wellness examination for [place].  Hearing/Vision screen No results found.  Dietary issues and exercise activities discussed:     Goals Addressed   None    Depression Screen    07/03/2022    8:31 AM 03/23/2022    8:09 AM 07/21/2021    8:08 AM 06/20/2021    8:09 AM 12/09/2020    8:08 AM 06/16/2020    9:13 AM 01/27/2020    1:14 PM  PHQ 2/9 Scores  PHQ - 2 Score 0 0 0 0 0 0 0    Fall Risk    07/03/2022    8:31 AM 07/02/2022    9:17 AM 03/23/2022    8:09 AM 07/21/2021    8:08 AM 06/20/2021    8:08 AM  Fall Risk   Falls in the past year? 0 0 1 1 1   Number falls in past yr:   0 0 0  Injury with Fall?   0 1 0  Risk for fall due to :   History of fall(s) History of fall(s) History of fall(s)  Follow up   Falls evaluation completed Falls evaluation completed Falls evaluation completed    FALL RISK PREVENTION PERTAINING TO THE HOME:  Any stairs in or around the home? No  If so, are there any without handrails? No  Home free of loose throw rugs in walkways, pet beds, electrical cords, etc? Yes  Adequate  lighting in your home to reduce risk of falls? Yes   ASSISTIVE DEVICES UTILIZED TO PREVENT FALLS:  Life alert? No  Use of a cane, walker or w/c? No  Grab bars in the bathroom? No  Shower chair or bench in shower? Yes  Elevated toilet seat or a handicapped toilet? Yes    Cognitive Function:        06/20/2021    8:14 AM 06/16/2020    9:17 AM 06/16/2019   10:28 AM 06/05/2018    3:15 PM 06/04/2017    3:06 PM  6CIT Screen  What Year? 0 points 0 points 0 points 0 points 0 points  What month? 0 points 0 points 0 points 0 points 0 points  What time? 0 points 0 points 0 points 0 points 0 points  Count back from 20 0 points  0 points 0 points 0 points  Months in reverse 0 points  0 points 0 points 0 points  Repeat phrase 0 points  0 points 0 points 0 points  Total Score 0 points  0 points 0 points 0 points    Immunizations Immunization History  Administered Date(s) Administered   Fluad Quad(high Dose 65+) 11/11/2018, 01/05/2020, 12/09/2020, 11/30/2021   Influenza Split 02/21/2011, 01/31/2012   Influenza Whole 01/23/2007, 12/21/2008, 02/02/2010   Influenza, High Dose Seasonal PF 12/21/2017   Influenza,inj,Quad PF,6+ Mos 12/24/2013, 01/18/2015, 12/01/2015, 01/18/2017   MODERNA COVID-19 SARS-COV-2 PEDS BIVALENT BOOSTER 6Y-11Y 10/14/2021   Moderna SARS-COV2 Booster Vaccination 01/24/2020, 07/08/2020, 12/16/2020   Moderna Sars-Covid-2 Vaccination 04/24/2019, 05/23/2019   Pneumococcal Conjugate-13 09/22/2013   Pneumococcal Polysaccharide-23 04/03/2013   Td 04/10/2005   Tdap 06/29/2014   Zoster Recombinat (  Shingrix) 06/05/2017, 09/06/2017   Zoster, Live 04/19/2009    TDAP status: Up to date  Flu Vaccine status: Up to date  Pneumococcal vaccine status: Up to date  Covid-19 vaccine status: Completed vaccines  Qualifies for Shingles Vaccine? Yes   Zostavax completed No   Shingrix Completed?: Yes  Screening Tests Health Maintenance  Topic Date Due   COVID-19 Vaccine (7 - 2023-24  season) 12/09/2021   INFLUENZA VACCINE  10/19/2022   Medicare Annual Wellness (AWV)  07/03/2023   MAMMOGRAM  03/08/2024   Fecal DNA (Cologuard)  05/31/2024   DTaP/Tdap/Td (3 - Td or Tdap) 06/28/2024   Pneumonia Vaccine 31+ Years old  Completed   DEXA SCAN  Completed   Hepatitis C Screening  Completed   Zoster Vaccines- Shingrix  Completed   HPV VACCINES  Aged Out   COLONOSCOPY (Pts 45-2yrs Insurance coverage will need to be confirmed)  Discontinued    Health Maintenance  Health Maintenance Due  Topic Date Due   COVID-19 Vaccine (7 - 2023-24 season) 12/09/2021    Colorectal cancer screening: Type of screening: Colonoscopy. Completed 1/26/20204. Repeat every 5 years  Mammogram status: Completed 03/08/2022. Repeat 2 years  Bone Density status: Completed 08/16/2021. Results reflect: Bone density results: NORMAL.   Lung Cancer Screening: (Low Dose CT Chest recommended if Age 89-80 years, 30 pack-year currently smoking OR have quit w/in 15years.) does not qualify.    Additional Screening:  Hepatitis C Screening: does not qualify; Completed 02/27/2011  Vision Screening: Recommended annual ophthalmology exams for early detection of glaucoma and other disorders of the eye. Is the patient up to date with their annual eye exam?  No  Who is the provider or what is the name of the office in which the patient attends annual eye exams? [facility] If pt is not established with a provider, would they like to be referred to a provider to establish care? No .   Dental Screening: Recommended annual dental exams for proper oral hygiene  Community Resource Referral / Chronic Care Management: CRR required this visit?  No   CCM required this visit?  No      Plan:     I have personally reviewed and noted the following in the patient's chart:   Medical and social history Use of alcohol, tobacco or illicit drugs  Current medications and supplements including opioid prescriptions. Patient is  not currently taking opioid prescriptions. Functional ability and status Nutritional status Physical activity Advanced directives List of other physicians Hospitalizations, surgeries, and ER visits in previous 12 months Vitals Screenings to include cognitive, depression, and falls Referrals and appointments  In addition, I have reviewed and discussed with patient certain preventive protocols, quality metrics, and best practice recommendations. A written personalized care plan for preventive services as well as general preventive health recommendations were provided to patient.     Milus Banister, MD   07/03/2022

## 2022-07-08 LAB — LAB REPORT - SCANNED: EGFR: 63

## 2022-07-20 ENCOUNTER — Ambulatory Visit: Payer: Medicare Other | Attending: Audiologist | Admitting: Audiologist

## 2022-07-20 DIAGNOSIS — H903 Sensorineural hearing loss, bilateral: Secondary | ICD-10-CM | POA: Insufficient documentation

## 2022-07-20 NOTE — Procedures (Signed)
  Outpatient Audiology and [facility] [address] [place], [place]  [address] [phone]  AUDIOLOGICAL  EVALUATION  NAME: ANNISON BIRCHARD     DOB:   17-Apr-1945      MRN: [id number]                                                                                     DATE: 07/20/2022     REFERENT: Kerri Perches, MD STATUS: Outpatient DIAGNOSIS: Sensorineural Hearing Loss Bilateral     History: Katryna was seen for an audiological evaluation. Tori is receiving a hearing evaluation due to concerns for hearing loss. She used to wear hearing aids but lost them. She feels she does well without aids and just needs people to speak up and speak clear. Jonte has difficulty hearing at church when the minister's back is turned. This difficulty began gradually. No pain or pressure reported in either ear. Tinnitus intermittent in both ears.  Medical history negative for a condition which is a risk factor for hearing loss. No other relevant case history reported.   Evaluation:  Otoscopy showed a clear view of the tympanic membranes, bilaterally Tympanometry results were consistent with normal middle ear function, bilaterally   Audiometric testing was completed using conventional audiometry with supraural transducer. Speech Recognition Thresholds were 40dB in the right ear and 40dB in the left ear. Word Recognition was performed 80dB, scored 100% in the right ear and 100% in the left ear. Pure tone thresholds show mild sloping to moderately severe sensorineural hearing loss in each ear.   Results:  The test results were reviewed with Victorino Dike. She has a sloping sensorineural hearing loss consistent with age related changes. She needs hearing aids and needs to wear them consistently, not just a church. She was counseled on the need for hearing aids. She said she will think about it.   Recommendations: Amplification is necessary for both ears. Hearing aids can be  purchased from a variety of locations. See provided list for locations in the [place] area. Also provided a handout on [place] healthcare Hearing benefit.    32 minutes spent testing and counseling on results.   Ammie Ferrier  Audiologist, Au.D., CCC-A 07/20/2022  9:40 AM  Cc: Kerri Perches, MD

## 2022-07-26 ENCOUNTER — Encounter: Payer: Self-pay | Admitting: Family Medicine

## 2022-07-26 ENCOUNTER — Ambulatory Visit (INDEPENDENT_AMBULATORY_CARE_PROVIDER_SITE_OTHER): Payer: Medicare Other | Admitting: Family Medicine

## 2022-07-26 VITALS — BP 124/72 | HR 65 | Ht 63.0 in | Wt 163.0 lb

## 2022-07-26 DIAGNOSIS — Z0000 Encounter for general adult medical examination without abnormal findings: Secondary | ICD-10-CM

## 2022-07-26 NOTE — Progress Notes (Signed)
    Christina Carroll     MRN: [id number]      DOB: Jun 28, 1945  Chief Complaint  Patient presents with   Annual Exam    Cpe back pain     HPI: Patient is in for annual physical exam. C/o intermittent low back pain, not disturbing her sleep or limiting function Recent labs,  are reviewed. Immunization is reviewed , and  updated if needed.   PE: BP 124/72   Pulse 65   Ht 5\' 3"  (1.6 m)   Wt 163 lb (73.9 kg)   SpO2 98%   BMI 28.87 kg/m   Pleasant  female, alert and oriented x 3, in no cardio-pulmonary distress. Afebrile. HEENT No facial trauma or asymetry. Sinuses non tender.  Extra occullar muscles intact.. External ears normal, . Neck: supple, no adenopathy,JVD or thyromegaly.No bruits.  Chest: Clear to ascultation bilaterally.No crackles or wheezes. Non tender to palpation   Cardiovascular system; Heart sounds normal,  S1 and  S2 ,no S3.  No murmur, or thrill. Apical beat not displaced Peripheral pulses normal.  Abdomen: Soft, non tender, no organomegaly or masses. No bruits. Bowel sounds normal. No guarding, tenderness or rebound.    Musculoskeletal exam: Full ROM of spine, hips , shoulders and knees. No deformity ,swelling or crepitus noted. No muscle wasting or atrophy.   Neurologic: Cranial nerves 2 to 12 intact. Power, tone ,sensation and reflexes normal throughout. No disturbance in gait. No tremor.  Skin: Intact, no ulceration, erythema , scaling or rash noted. Pigmentation normal throughout  Psych; Normal mood and affect. Judgement and concentration normal   Assessment & Plan:  No problem-specific Assessment & Plan notes found for this encounter.

## 2022-07-26 NOTE — Patient Instructions (Addendum)
F/U in 6 months, call if you need `me sooner  7 pound weight loss in the next year is a good goal  Excellent exam  You need the RSV vaccine one time only,  Also the Covid booster   Please continue green eating Increase exercise commitment  Nurse will let you know if she found labs if not please drop off the ones you have so I can follow up  My concerns ar cholesterol, blood sugar , kidney and liver function  I will order labs for your next visit depending on what we see  when we get them, so will be in touch  Thanks for choosing [facility], we consider it a privelige to serve you.

## 2022-07-28 ENCOUNTER — Encounter: Payer: Self-pay | Admitting: Family Medicine

## 2022-07-28 DIAGNOSIS — Z0001 Encounter for general adult medical examination with abnormal findings: Secondary | ICD-10-CM | POA: Insufficient documentation

## 2022-07-28 DIAGNOSIS — Z0000 Encounter for general adult medical examination without abnormal findings: Secondary | ICD-10-CM | POA: Insufficient documentation

## 2022-07-28 NOTE — Assessment & Plan Note (Signed)

## 2022-10-05 ENCOUNTER — Telehealth: Payer: Self-pay | Admitting: Family Medicine

## 2022-10-05 DIAGNOSIS — E039 Hypothyroidism, unspecified: Secondary | ICD-10-CM

## 2022-10-05 NOTE — Telephone Encounter (Signed)
Patient called said her Thyroid doctor is closing their office end of August 2024.  Can patient send her records to Dr Lodema Hong until a new referral place for a new Thyroid doctor, she has been on medication for her thyroid and concern.   Patient has an appointment with Dr Lodema Hong on 08.20.2024 for a referral to a new thyroid provider.

## 2022-10-10 NOTE — Telephone Encounter (Signed)
LMTRC-KG 

## 2022-10-30 NOTE — Telephone Encounter (Signed)
Ok to refer the pt to Endo for thyroid?

## 2022-10-30 NOTE — Addendum Note (Signed)
Addended by: Syliva Overman E on: 10/30/2022 12:44 PM   Modules accepted: Orders

## 2022-10-30 NOTE — Telephone Encounter (Signed)
Patient wants to have  referral placed for Dr Fransico Him here in [place] for tyroid.

## 2022-11-07 ENCOUNTER — Ambulatory Visit (INDEPENDENT_AMBULATORY_CARE_PROVIDER_SITE_OTHER): Payer: Medicare Other | Admitting: Family Medicine

## 2022-11-07 ENCOUNTER — Encounter: Payer: Self-pay | Admitting: Family Medicine

## 2022-11-07 ENCOUNTER — Other Ambulatory Visit: Payer: Self-pay

## 2022-11-07 VITALS — BP 136/80 | HR 74 | Ht 63.0 in | Wt 153.0 lb

## 2022-11-07 DIAGNOSIS — R7303 Prediabetes: Secondary | ICD-10-CM

## 2022-11-07 DIAGNOSIS — I10 Essential (primary) hypertension: Secondary | ICD-10-CM | POA: Diagnosis not present

## 2022-11-07 DIAGNOSIS — E785 Hyperlipidemia, unspecified: Secondary | ICD-10-CM

## 2022-11-07 DIAGNOSIS — R630 Anorexia: Secondary | ICD-10-CM

## 2022-11-07 DIAGNOSIS — E039 Hypothyroidism, unspecified: Secondary | ICD-10-CM

## 2022-11-07 DIAGNOSIS — E559 Vitamin D deficiency, unspecified: Secondary | ICD-10-CM

## 2022-11-07 DIAGNOSIS — F418 Other specified anxiety disorders: Secondary | ICD-10-CM

## 2022-11-07 DIAGNOSIS — R634 Abnormal weight loss: Secondary | ICD-10-CM

## 2022-11-07 NOTE — Patient Instructions (Signed)
F/U in 6 weeks   Labs today cBC, lipid, cmp and eGFR, hBA1c, TSh and vit  D   dX weight loss   I will look furhter at record re inrtermittent chest pai and also address your stress  Thanks for choosing [facility], we consider it a privelige to serve you.

## 2022-11-07 NOTE — Progress Notes (Unsigned)
   Christina Carroll     MRN: [id number]      DOB: [birth year]  Chief Complaint  Patient presents with   Follow-up    Discuss issues feels like something is wrong 10 pound weight loss since may    HPI Christina Carroll is here at the encouragement of her friends , she has lost 10 pounds in the past 3 months, reports decreased appetite, no c/o abdominal pain , colonoscopy done 03/2022,mammogram in 02/2022 normal ROS Denies recent fever or chills. Denies sinus pressure, nasal congestion, ear pain or sore throat. Denies chest congestion, productive cough or wheezing. Denies chest pains, palpitations and leg swelling Denies abdominal pain, nausea, vomiting,diarrhea or constipation.   Denies dysuria, frequency, hesitancy or incontinence. Denies joint pain, swelling and limitation in mobility. Denies headaches, seizures, numbness, or tingling. Denies depression, anxiety or insomnia. Denies skin break down or rash.   PE  BP 136/80 (BP Location: Right Arm, Patient Position: Sitting, Cuff Size: Normal)   Pulse 74   Ht 5\' 3"  (1.6 m)   Wt 153 lb (69.4 kg)   SpO2 97%   BMI 27.10 kg/m   Patient alert and oriented and in no cardiopulmonary distress.  HEENT: No facial asymmetry, EOMI,     Neck supple .  Chest: Clear to auscultation bilaterally.  CVS: S1, S2 no murmurs, no S3.Regular rate.  ABD: Soft non tender.   Ext: No edema  MS: Adequate ROM spine, shoulders, hips and knees.  Skin: Intact, no ulcerations or rash noted.  Psych: Good eye contact, normal affect. Memory intact not anxious or depressed appearing.  CNS: CN 2-12 intact, power,  normal throughout.no focal deficits noted.   Assessment & Plan  No problem-specific Assessment & Plan notes found for this encounter.

## 2022-11-08 LAB — CMP14+EGFR
ALT: 22 IU/L (ref 0–32)
AST: 27 IU/L (ref 0–40)
Albumin: 4.6 g/dL (ref 3.8–4.8)
Alkaline Phosphatase: 102 IU/L (ref 44–121)
BUN/Creatinine Ratio: 20 (ref 12–28)
BUN: 20 mg/dL (ref 8–27)
Bilirubin Total: 1 mg/dL (ref 0.0–1.2)
CO2: 28 mmol/L (ref 20–29)
Calcium: 11 mg/dL — ABNORMAL HIGH (ref 8.7–10.3)
Chloride: 96 mmol/L (ref 96–106)
Creatinine, Ser: 1 mg/dL (ref 0.57–1.00)
Globulin, Total: 2.9 g/dL (ref 1.5–4.5)
Glucose: 89 mg/dL (ref 70–99)
Potassium: 4.5 mmol/L (ref 3.5–5.2)
Sodium: 140 mmol/L (ref 134–144)
Total Protein: 7.5 g/dL (ref 6.0–8.5)
eGFR: 59 mL/min/{1.73_m2} — ABNORMAL LOW (ref >59)

## 2022-11-08 LAB — HEMOGLOBIN A1C
Est. average glucose Bld gHb Est-mCnc: 126 mg/dL
Hgb A1c MFr Bld: 6 % — ABNORMAL HIGH (ref 4.8–5.6)

## 2022-11-08 LAB — LIPID PANEL
Chol/HDL Ratio: 2.7 ratio (ref 0.0–4.4)
Cholesterol, Total: 256 mg/dL — ABNORMAL HIGH (ref 100–199)
HDL: 95 mg/dL (ref >39)
LDL Chol Calc (NIH): 147 mg/dL — ABNORMAL HIGH (ref 0–99)
Triglycerides: 84 mg/dL (ref 0–149)
VLDL Cholesterol Cal: 14 mg/dL (ref 5–40)

## 2022-11-08 LAB — VITAMIN D 25 HYDROXY (VIT D DEFICIENCY, FRACTURES): Vit D, 25-Hydroxy: 54.8 ng/mL (ref 30.0–100.0)

## 2022-11-08 LAB — TSH: TSH: 2.5 u[IU]/mL (ref 0.450–4.500)

## 2022-11-09 DIAGNOSIS — R634 Abnormal weight loss: Secondary | ICD-10-CM | POA: Insufficient documentation

## 2022-11-09 DIAGNOSIS — F418 Other specified anxiety disorders: Secondary | ICD-10-CM | POA: Insufficient documentation

## 2022-11-09 DIAGNOSIS — E039 Hypothyroidism, unspecified: Secondary | ICD-10-CM | POA: Insufficient documentation

## 2022-11-09 DIAGNOSIS — R630 Anorexia: Secondary | ICD-10-CM | POA: Insufficient documentation

## 2022-11-09 NOTE — Assessment & Plan Note (Signed)
3 month h/o 10 pound weight loss, increased anxiety and mild depresssio , referred for therapy with close f/u, she is also to start weighing at home

## 2022-11-09 NOTE — Assessment & Plan Note (Signed)
Updated lab adequately corrected

## 2022-11-09 NOTE — Assessment & Plan Note (Signed)
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

## 2022-11-09 NOTE — Assessment & Plan Note (Addendum)
Trigger is "Life events" will benefit from and wants therapy, referred

## 2022-11-09 NOTE — Assessment & Plan Note (Signed)
Hyperlipidemia:Low fat diet discussed and encouraged.   Lipid Panel  Lab Results  Component Value Date   CHOL 256 (H) 11/07/2022   HDL 95 11/07/2022   LDLCALC 147 (H) 11/07/2022   TRIG 84 11/07/2022   CHOLHDL 2.7 11/07/2022     Uncontrolled , sites recent poor food choices as using food for comfort

## 2022-11-09 NOTE — Assessment & Plan Note (Signed)
Controlled, no change in medication DASH diet and commitment to daily physical activity for a minimum of 30 minutes discussed and encouraged, as a part of hypertension management. The importance of attaining a healthy weight is also discussed.     11/07/2022    3:50 PM 07/26/2022    8:47 AM 07/26/2022    8:10 AM 07/03/2022    9:10 AM 04/04/2022   12:10 PM 03/30/2022   10:29 AM 03/23/2022    8:07 AM  BP/Weight  Systolic BP 136 124 123 138 113 126 128  Diastolic BP 80 72 76 88 61 71 85  Wt. (Lbs) 153  163 159  162.08 162.08  BMI 27.1 kg/m2  28.87 kg/m2 28.17 kg/m2  28.71 kg/m2 28.71 kg/m2

## 2022-11-09 NOTE — Assessment & Plan Note (Addendum)
Deteriorated Patient educated about the importance of limiting  Carbohydrate intake , the need to commit to daily physical activity for a minimum of 30 minutes , and to commit weight loss. The fact that changes in all these areas will reduce or eliminate all together the development of diabetes is stressed.      Latest Ref Rng & Units 11/07/2022    4:46 PM 03/23/2022    8:49 AM 12/09/2020    8:44 AM 05/07/2019    8:34 AM 11/06/2018   11:00 AM  Diabetic Labs  HbA1c 4.8 - 5.6 % 6.0  5.9  5.7  5.9  5.8   Chol 100 - 199 mg/dL 213  086  578  469  629   HDL >39 mg/dL 95  86  92  72  74   Calc LDL 0 - 99 mg/dL 528  413  244  010  272   Triglycerides 0 - 149 mg/dL 84  61  69  84  71   Creatinine 0.57 - 1.00 mg/dL 5.36  6.44  0.34  7.42  1.00       11/07/2022    3:50 PM 07/26/2022    8:47 AM 07/26/2022    8:10 AM 07/03/2022    9:10 AM 04/04/2022   12:10 PM 03/30/2022   10:29 AM 03/23/2022    8:07 AM  BP/Weight  Systolic BP 136 124 123 138 113 126 128  Diastolic BP 80 72 76 88 61 71 85  Wt. (Lbs) 153  163 159  162.08 162.08  BMI 27.1 kg/m2  28.87 kg/m2 28.17 kg/m2  28.71 kg/m2 28.71 kg/m2       No data to display          Poor feed choice recently eating a lot of ice cream

## 2022-11-21 ENCOUNTER — Telehealth: Payer: Self-pay | Admitting: Family Medicine

## 2022-11-21 NOTE — Telephone Encounter (Signed)
Patient came by office and said she pick up her vitmain D rx but can not find it concern she might have throw it into the trash can. Can she get a new rx sent to her pharmacy  Cholecalciferol (VITAMIN D) 50 MCG (2000 UT) CAPS [[id number]]   Pharmacy  [facility] - [place], [place] - [address], [place] [address] Phone: [phone]  Fax: [phone]    Patient asked for nurse to give her a call.

## 2022-11-22 ENCOUNTER — Other Ambulatory Visit: Payer: Self-pay

## 2022-11-22 MED ORDER — VITAMIN D 50 MCG (2000 UT) PO CAPS: 1.0000 | ORAL_CAPSULE | Freq: Every day | ORAL | 3 refills | Status: DC

## 2022-11-22 NOTE — Telephone Encounter (Signed)
Refill sent.

## 2022-12-07 ENCOUNTER — Ambulatory Visit: Payer: Medicare Other | Admitting: Professional Counselor

## 2022-12-07 DIAGNOSIS — F418 Other specified anxiety disorders: Secondary | ICD-10-CM | POA: Diagnosis not present

## 2022-12-07 DIAGNOSIS — F320 Major depressive disorder, single episode, mild: Secondary | ICD-10-CM

## 2022-12-07 NOTE — BH Specialist Note (Signed)
Collaborative Care Initial Assessment  Session Start time: 2:00 pm   Session End time: 3:00  Total time in minutes: 60 min   Type of Contact:  Face to Face Patient consent obtained:  Yes  Types of Service: Collaborative care  Summary  Patient is a 77 yo female being referred to collaborative care by her pcp for anxiety and depression. Patient was engaged and cooperative during session.   Reason for referral in patient/family's own words:  "PCP thinks I need somebody to talk to"  Patient's goal for today's visit: "Just someone to talk to"  History of Present illness:   The patient is a 77 year old female with a history of depression, referred to collaborative care by her primary care physician due to elevated PHQ-9 and GAD-7 scores. She reports feeling significant financial stress, which has triggered depressive episodes. The patient describes staying at home most of the time, feeling isolated, and avoiding reaching out for help due to concerns about being judged by members of her church. She recently faced a stressful situation involving her home insurance, which threatened to drop her coverage unless a faulty wall was fixed. Although she was able to resolve the issue, it added to her stress. Additionally, her son may have cancer, and he has already undergone some surgeries, which has further contributed to her anxiety.  The patient expresses feelings of sadness about her stage of life, particularly as she sees her friends enjoying retirement and travel, while she is limited by a fixed income. She feels unable to do the things she used to enjoy, which has deepened her sense of depression. Despite her struggles, she has no history of psychiatric hospitalizations, suicidality, trauma, or substance abuse, and she is not currently taking any psychiatric medications. Medically, she is being treated for arthritis and hip pain by her primary care physician. She reports that her sleep is not  significantly impacted, though she has noticed a decrease in appetite. Overall, the patient is looking for someone to talk to and help her navigate this challenging time in her life, feeling that she needs support in working through her stress, isolation, and feelings about aging.   Clinical Assessment   PHQ-9 Assessments:    12/07/2022    2:17 PM 11/09/2022    2:16 PM 11/07/2022    3:50 PM 07/26/2022    8:11 AM 07/03/2022    8:31 AM  Depression screen PHQ 2/9  Decreased Interest 1 0 0 0 0  Down, Depressed, Hopeless 1 1 1 1  0  PHQ - 2 Score 2 1 1 1  0  Altered sleeping 0 0     Tired, decreased energy 0 0     Change in appetite 1 2     Feeling bad or failure about yourself  2 3     Trouble concentrating 0 0     Moving slowly or fidgety/restless 0 0     Suicidal thoughts 0 0     PHQ-9 Score 5 6     Difficult doing work/chores Somewhat difficult        GAD-7 Assessments:    12/07/2022    2:18 PM 11/09/2022    2:22 PM 07/26/2022    8:33 AM  GAD 7 : Generalized Anxiety Score  Nervous, Anxious, on Edge 1 3 0  Control/stop worrying 1 2 0  Worry too much - different things 1 2 0  Trouble relaxing 0 0 0  Restless 0 0 0  Easily annoyed or irritable 0 0  0  Afraid - awful might happen 0 1 0  Total GAD 7 Score 3 8 0  Anxiety Difficulty Somewhat difficult       Social History:  Household: Live Marital status: Divorced Number of Children: 1 Employment: Retired Education: Marketing executive Review of systems: Insomnia: Sleeping pattern is fair Changes in appetite: Decreased Decreased need for sleep: No Family history of bipolar disorder: No Hallucinations: No   Paranoia: No    Psychotropic medications: Current medications: None Patient taking medications as prescribed: No Side effects reported: NA  Current medications (medication list) Current Outpatient Medications on File Prior to Visit  Medication Sig Dispense Refill   Calcium Carbonate-Vitamin D (OYSTER SHELL CALCIUM  500 + D PO) Take 1 tablet by mouth in the morning, at noon, and at bedtime.     Cholecalciferol (VITAMIN D) 50 MCG (2000 UT) CAPS Take 1 capsule (2,000 Units total) by mouth daily. 100 capsule 3   LINIMENTS EX Apply 1 Application topically daily as needed (pain). Horse Liniment     NIFEdipine (PROCARDIA XL/NIFEDICAL XL) 60 MG 24 hr tablet TAKE 1 TABLET BY MOUTH  DAILY 90 tablet 3   pravastatin (PRAVACHOL) 80 MG tablet TAKE 1 TABLET BY MOUTH DAILY 90 tablet 3   triamterene-hydrochlorothiazide (MAXZIDE-25) 37.5-25 MG tablet TAKE 1 TABLET BY MOUTH DAILY 90 tablet 3   UNITHROID 50 MCG tablet Take 50 mcg by mouth daily before breakfast.     No current facility-administered medications on file prior to visit.    Psychiatric History: Past psychiatry diagnosis: No Patient currently being seen by therapist/psychiatrist:  No Prior Suicide Attempts: No Past psychiatry Hospitalization(s): No Past history of violence: No  Traumatic Experiences: History or current traumatic events (natural disaster, house fire, etc.)? no History or current physical trauma?  no History or current emotional trauma?  no History or current sexual trauma?  no History or current domestic or intimate partner violence?  no PTSD symptoms if any traumatic experiences no   Alcohol and/or Substance Use History   Tobacco Alcohol Other substances  Current use Never Drank a few times in her lifetime Never  Past use     Past treatment      Withdrawal Potential: None  Self-harm Behaviors Risk Assessment Self-harm risk factors:  None Patient endorses recent thoughts of harming self: Denies [place] Suicide Severity Rating Scale:   Guns in the home: No   Protective factors: Hopefulness  Danger to Others Risk Assessment Danger to others risk factors:  None Patient endorses recent thoughts of harming others:  Denies Dynamic Appraisal of Situational Aggression (DASA):   BH Counselor discussed emergency crisis plan with  client and provided local emergency services resources.  Mental status exam:   General Appearance Christina Carroll:  Casual Eye Contact:  Good Motor Behavior:  Normal Speech:  Normal Level of Consciousness:  Alert Mood:  Anxious Affect:  Appropriate Anxiety Level:  Minimal Thought Process:  Coherent Thought Content:  WNL Perception:  Normal Judgment:  Good Insight:  Present  Diagnosis:   Goals: Improve self care and increase social activity   Interventions: Behavioral Activation and CBT Cognitive Behavioral Therapy

## 2022-12-22 ENCOUNTER — Telehealth (INDEPENDENT_AMBULATORY_CARE_PROVIDER_SITE_OTHER): Payer: Medicare Other | Admitting: Professional Counselor

## 2022-12-22 DIAGNOSIS — F320 Major depressive disorder, single episode, mild: Secondary | ICD-10-CM

## 2022-12-22 NOTE — BH Specialist Note (Cosign Needed)
Virtual Behavioral Health Treatment Plan Team Note  MRN: [id number] NAME: Christina Carroll  DATE: 12/22/22  Start time: Start Time: 1024 End time: Stop Time: 1031 Total time: Total Time in Minutes (Visit): 7 Documentation Time: 20 min  Total number of Virtual BH Treatment Team Plan encounters: 1/4  Treatment Team Attendees: Dr. Vanetta Shawl and Esmond Harps  Collaborative Care Psychiatric Consultant Case Review    Assessment/Provisional Diagnosis Christina Carroll is a 77 y.o. year old female with history of depression, anxiety, hypertension. The patient is referred for depression.    # MDD, recurrent, mild The patient is experiencing depressive symptoms and anxiety in the context of the following stressors. The behavioral health specialist will offer supportive therapy.    # Weight loss/appetite loss It is unclear whether her appetite loss is secondary to her mood symptoms. Will continue to monitor it as her mood improves. While she may benefit from mirtazapine if her condition worsens in the future, will hold off on prescribing it for now, as she is responding very well to psychotherapy.   Recommendation BH specialist to follow up every other week for supportive therapy.   Goals, Interventions and Follow-up Plan Goals:  Improve self care and increase social activity Interventions: Behavioral Activation CBT Cognitive Behavioral Therapy Medication Management Recommendations: NA Follow-up Plan:  Bi-weekly behavior activation  History of the present illness Presenting Problem/Current Symptoms:  The patient is a 77 year old female with a history of depression, referred to collaborative care by her primary care physician due to elevated PHQ-9 and GAD-7 scores. She reports feeling significant financial stress, which has triggered depressive episodes. The patient describes staying at home most of the time, feeling isolated, and avoiding reaching out for help due to concerns about  being judged by members of her church. She recently faced a stressful situation involving her home insurance, which threatened to drop her coverage unless a faulty wall was fixed. Although she was able to resolve the issue, it added to her stress. Additionally, her son may have cancer, and he has already undergone some surgeries, which has further contributed to her anxiety.   The patient expresses feelings of sadness about her stage of life, particularly as she sees her friends enjoying retirement and travel, while she is limited by a fixed income. She feels unable to do the things she used to enjoy, which has deepened her sense of depression. Despite her struggles, she has no history of psychiatric hospitalizations, suicidality, trauma, or substance abuse, and she is not currently taking any psychiatric medications. Medically, she is being treated for arthritis and hip pain by her primary care physician. She reports that her sleep is not significantly impacted, though she has noticed a decrease in appetite. Overall, the patient is looking for someone to talk to and help her navigate this challenging time in her life, feeling that she needs support in working through her stress, isolation, and feelings about aging.     Screenings PHQ-9 Assessments:     12/07/2022    2:17 PM 11/09/2022    2:16 PM 11/07/2022    3:50 PM  Depression screen PHQ 2/9  Decreased Interest 1 0 0  Down, Depressed, Hopeless 1 1 1   PHQ - 2 Score 2 1 1   Altered sleeping 0 0   Tired, decreased energy 0 0   Change in appetite 1 2   Feeling bad or failure about yourself  2 3   Trouble concentrating 0 0   Moving slowly or fidgety/restless 0  0   Suicidal thoughts 0 0   PHQ-9 Score 5 6   Difficult doing work/chores Somewhat difficult     GAD-7 Assessments:     12/07/2022    2:18 PM 11/09/2022    2:22 PM 07/26/2022    8:33 AM  GAD 7 : Generalized Anxiety Score  Nervous, Anxious, on Edge 1 3 0  Control/stop worrying 1 2 0   Worry too much - different things 1 2 0  Trouble relaxing 0 0 0  Restless 0 0 0  Easily annoyed or irritable 0 0 0  Afraid - awful might happen 0 1 0  Total GAD 7 Score 3 8 0  Anxiety Difficulty Somewhat difficult      Past Medical History Past Medical History:  Diagnosis Date   Arthritis    BACK PAIN 02/02/2010   Qualifier: Diagnosis of  By: Lodema Hong MD, Margaret     Essential hypertension    Hyperlipidemia    Hypertension    Phreesia 07/05/2020   Hypothyroidism    Lymphocytic thyroiditis    Metabolic syndrome X 12/02/2013   NEVUS 05/12/2010   Qualifier: Diagnosis of  By: Lodema Hong MD, Margaret     Obesity    Prediabetes    Primary osteoarthritis, 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  Allergies:  Allergies as of 12/22/2022   (No Known Allergies)    Medication History Current medications:  Outpatient Encounter Medications as of 12/22/2022  Medication Sig   Calcium Carbonate-Vitamin D (OYSTER SHELL CALCIUM 500 + D PO) Take 1 tablet by mouth in the morning, at noon, and at bedtime.   Cholecalciferol (VITAMIN D) 50 MCG (2000 UT) CAPS Take 1 capsule (2,000 Units total) by mouth daily.   LINIMENTS EX Apply 1 Application topically daily as needed (pain). Horse Liniment   NIFEdipine (PROCARDIA XL/NIFEDICAL XL) 60 MG 24 hr tablet TAKE 1 TABLET BY MOUTH  DAILY   pravastatin (PRAVACHOL) 80 MG tablet TAKE 1 TABLET BY MOUTH DAILY   triamterene-hydrochlorothiazide (MAXZIDE-25) 37.5-25 MG tablet TAKE 1 TABLET BY MOUTH DAILY   UNITHROID 50 MCG tablet Take 50 mcg by mouth daily before breakfast.   No facility-administered encounter medications on file as of 12/22/2022.     Scribe for Treatment Team: [name]

## 2022-12-24 NOTE — Progress Notes (Addendum)
I have reviewed and agree with the  documentation. Milus Mallick. Lodema Hong, MD [facility] Primary Care Location Provider: office Location Patient: office

## 2022-12-29 ENCOUNTER — Ambulatory Visit: Payer: Medicare Other | Admitting: Professional Counselor

## 2023-01-05 ENCOUNTER — Ambulatory Visit: Payer: Medicare Other | Admitting: Professional Counselor

## 2023-01-05 ENCOUNTER — Ambulatory Visit: Payer: Medicare Other | Admitting: Family Medicine

## 2023-01-05 DIAGNOSIS — F320 Major depressive disorder, single episode, mild: Secondary | ICD-10-CM

## 2023-01-05 NOTE — BH Specialist Note (Unsigned)
[facility] Virtual BH Telephone Follow-up  MRN: [id number] NAME: Christina Carroll Date: 01/05/23  Start time: Start Time: 0900 End time: Stop Time: 0930 Total time: Total Time in Minutes (Visit): 30 Call number: Visit Number: 3- Third Visit  Reason for call today:  The patient is a 77 year old female presenting for a collaborative care follow-up. She appeared in a positive mood during the session, reporting that her symptoms have improved since our last meeting. She expressed feeling a sense of calm from the behavioral strategies I provided previously. The patient had been overwhelmed due to financial concerns and a threat from her homeowners insurance, which required her to fix a wall or risk losing coverage. This situation caused significant stress and anxiety as she was unsure how she would afford the repair. However, her son stepped in to help, and the wall was fixed, bringing her a great sense of relief. While she continues to feel some financial strain, she is much more hopeful and relaxed about the situation. A follow-up session is scheduled in two weeks.  PHQ-9 Scores:     01/05/2023    9:21 AM 01/05/2023    9:18 AM 12/07/2022    2:17 PM 11/09/2022    2:16 PM 11/07/2022    3:50 PM  Depression screen PHQ 2/9  Decreased Interest 1 1 1  0 0  Down, Depressed, Hopeless 0 0 1 1 1   PHQ - 2 Score 1 1 2 1 1   Altered sleeping 0 0 0 0   Tired, decreased energy 0 0 0 0   Change in appetite 0 1 1 2    Feeling bad or failure about yourself  2 0 2 3   Trouble concentrating 0 0 0 0   Moving slowly or fidgety/restless 0 0 0 0   Suicidal thoughts 0 0 0 0   PHQ-9 Score 3 2 5 6    Difficult doing work/chores Somewhat difficult  Somewhat difficult     GAD-7 Scores:     01/05/2023    9:20 AM 12/07/2022    2:18 PM 11/09/2022    2:22 PM 07/26/2022    8:33 AM  GAD 7 : Generalized Anxiety Score  Nervous, Anxious, on Edge 1 1 3  0  Control/stop worrying 1 1 2  0  Worry too much - different things 0  1 2 0  Trouble relaxing 0 0 0 0  Restless 0 0 0 0  Easily annoyed or irritable 0 0 0 0  Afraid - awful might happen 0 0 1 0  Total GAD 7 Score 2 3 8  0  Anxiety Difficulty  Somewhat difficult      Stress Current stressors:  Finances Sleep:  Good Appetite:  Good Coping ability:  Good Patient taking medications as prescribed:  Yes  Current medications:  Outpatient Encounter Medications as of 01/05/2023  Medication Sig   Calcium Carbonate-Vitamin D (OYSTER SHELL CALCIUM 500 + D PO) Take 1 tablet by mouth in the morning, at noon, and at bedtime.   Cholecalciferol (VITAMIN D) 50 MCG (2000 UT) CAPS Take 1 capsule (2,000 Units total) by mouth daily.   LINIMENTS EX Apply 1 Application topically daily as needed (pain). Horse Liniment   NIFEdipine (PROCARDIA XL/NIFEDICAL XL) 60 MG 24 hr tablet TAKE 1 TABLET BY MOUTH  DAILY   pravastatin (PRAVACHOL) 80 MG tablet TAKE 1 TABLET BY MOUTH DAILY   triamterene-hydrochlorothiazide (MAXZIDE-25) 37.5-25 MG tablet TAKE 1 TABLET BY MOUTH DAILY   UNITHROID 50 MCG tablet Take 50 mcg by  mouth daily before breakfast.   No facility-administered encounter medications on file as of 01/05/2023.     Self-harm Behaviors Risk Assessment Self-harm risk factors:   Patient endorses recent thoughts of harming self:    [place] Suicide Severity Rating Scale: Failed to redirect to the Timeline version of the REVFS SmartLink.   Danger to Others Risk Assessment Danger to others risk factors:   Patient endorses recent thoughts of harming others:    Dynamic Appraisal of Situational Aggression (DASA):      No data to display           Substance Use Assessment Patient recently consumed alcohol:    Alcohol Use Disorder Identification Test (AUDIT):     06/16/2020    9:16 AM 07/02/2022    9:17 AM  Alcohol Use Disorder Test (AUDIT)  1. How often do you have a drink containing alcohol? 0 0  2. How many drinks containing alcohol do you have on a typical day when  you are drinking? 0   3. How often do you have six or more drinks on one occasion? 0   AUDIT-C Score 0   Alcohol Brief Interventions/Follow-up AUDIT Score <7 follow-up not indicated     Goals, Interventions and Follow-up Plan Goals: Increase healthy adjustment to current life circumstances Interventions: Behavioral Activation and CBT Cognitive Behavioral Therapy Follow-up Plan:  3 weeks    [name]

## 2023-01-12 ENCOUNTER — Ambulatory Visit (INDEPENDENT_AMBULATORY_CARE_PROVIDER_SITE_OTHER): Payer: Medicare Other | Admitting: Family Medicine

## 2023-01-12 ENCOUNTER — Encounter: Payer: Self-pay | Admitting: Family Medicine

## 2023-01-12 VITALS — BP 124/82 | HR 94 | Ht 63.0 in | Wt 152.0 lb

## 2023-01-12 DIAGNOSIS — I10 Essential (primary) hypertension: Secondary | ICD-10-CM

## 2023-01-12 DIAGNOSIS — M25551 Pain in right hip: Secondary | ICD-10-CM

## 2023-01-12 DIAGNOSIS — Z1231 Encounter for screening mammogram for malignant neoplasm of breast: Secondary | ICD-10-CM

## 2023-01-12 DIAGNOSIS — E663 Overweight: Secondary | ICD-10-CM | POA: Diagnosis not present

## 2023-01-12 DIAGNOSIS — M5431 Sciatica, right side: Secondary | ICD-10-CM

## 2023-01-12 DIAGNOSIS — R7303 Prediabetes: Secondary | ICD-10-CM

## 2023-01-12 DIAGNOSIS — E785 Hyperlipidemia, unspecified: Secondary | ICD-10-CM | POA: Diagnosis not present

## 2023-01-12 MED ORDER — PREDNISONE 10 MG PO TABS: 10.0000 mg | ORAL_TABLET | Freq: Two times a day (BID) | ORAL | 0 refills | Status: DC

## 2023-01-12 NOTE — Assessment & Plan Note (Signed)
Noted twice ask endo to eval

## 2023-01-12 NOTE — Assessment & Plan Note (Signed)
  Patient re-educated about  the importance of commitment to a  minimum of 150 minutes of exercise per week as able.  The importance of healthy food choices with portion control discussed, as well as eating regularly and within a 12 hour window most days. The need to choose "clean , green" food 50 to 75% of the time is discussed, as well as to make water the primary drink and set a goal of 64 ounces water daily.       01/12/2023    2:09 PM 11/07/2022    3:50 PM 07/26/2022    8:10 AM  Weight /BMI  Weight 152 lb 0.6 oz 153 lb 163 lb  Height 5\' 3"  (1.6 m) 5\' 3"  (1.6 m) 5\' 3"  (1.6 m)  BMI 26.93 kg/m2 27.1 kg/m2 28.87 kg/m2

## 2023-01-12 NOTE — Assessment & Plan Note (Signed)
Patient educated about the importance of limiting  Carbohydrate intake , the need to commit to daily physical activity for a minimum of 30 minutes , and to commit weight loss. The fact that changes in all these areas will reduce or eliminate all together the development of diabetes is stressed.      Latest Ref Rng & Units 11/07/2022    4:46 PM 03/23/2022    8:49 AM 12/09/2020    8:44 AM 05/07/2019    8:34 AM 11/06/2018   11:00 AM  Diabetic Labs  HbA1c 4.8 - 5.6 % 6.0  5.9  5.7  5.9  5.8   Chol 100 - 199 mg/dL 161  096  045  409  811   HDL >39 mg/dL 95  86  92  72  74   Calc LDL 0 - 99 mg/dL 914  782  956  213  086   Triglycerides 0 - 149 mg/dL 84  61  69  84  71   Creatinine 0.57 - 1.00 mg/dL 5.78  4.69  6.29  5.28  1.00       01/12/2023    2:38 PM 01/12/2023    2:11 PM 01/12/2023    2:09 PM 11/07/2022    3:50 PM 07/26/2022    8:47 AM 07/26/2022    8:10 AM 07/03/2022    9:10 AM  BP/Weight  Systolic BP 124 139 151 136 124 123 138  Diastolic BP 82 90 88 80 72 76 88  Wt. (Lbs)   152.04 153  163 159  BMI   26.93 kg/m2 27.1 kg/m2  28.87 kg/m2 28.17 kg/m2       No data to display          Updated lab needed at/ before next visit.

## 2023-01-12 NOTE — Assessment & Plan Note (Signed)
Controlled, no change in medication DASH diet and commitment to daily physical activity for a minimum of 30 minutes discussed and encouraged, as a part of hypertension management. The importance of attaining a healthy weight is also discussed.     01/12/2023    2:38 PM 01/12/2023    2:11 PM 01/12/2023    2:09 PM 11/07/2022    3:50 PM 07/26/2022    8:47 AM 07/26/2022    8:10 AM 07/03/2022    9:10 AM  BP/Weight  Systolic BP 124 139 151 136 124 123 138  Diastolic BP 82 90 88 80 72 76 88  Wt. (Lbs)   152.04 153  163 159  BMI   26.93 kg/m2 27.1 kg/m2  28.87 kg/m2 28.17 kg/m2

## 2023-01-12 NOTE — Assessment & Plan Note (Signed)
Hyperlipidemia:Low fat diet discussed and encouraged.   Lipid Panel  Lab Results  Component Value Date   CHOL 256 (H) 11/07/2022   HDL 95 11/07/2022   LDLCALC 147 (H) 11/07/2022   TRIG 84 11/07/2022   CHOLHDL 2.7 11/07/2022     .ul

## 2023-01-12 NOTE — Patient Instructions (Addendum)
F/U mid February, call if you need me sooner  Please schedule mammogram at checkout  You are treated  with a 5 day course of prednisone for right sided sciatica and arthritic  pain in your right hip  Pls send message if symptoms persist or worsen  You will get the flu vaccine today once we know if you need it  Fasting CBC, lipid, cmp and EGFR, HBA1C 5 to 7 days before next appt  Thankful that therapy has been beneficial

## 2023-01-12 NOTE — Progress Notes (Unsigned)
   Christina Carroll     MRN: [id number]      DOB: 1945-10-14  Chief Complaint  Patient presents with   Follow-up    Follow up hip, knee,back pain     HPI Christina Carroll is here for follow up and re-evaluation of chronic medical conditions, medication management and review of any available recent lab and radiology data.  Preventive health is updated, specifically  Cancer screening and Immunization.   Questions or concerns regarding consultations or procedures which the PT has had in the interim are  addressed.Very happy with therapy Last 1 month she notes pain in right thigh at most rated at 4 zand in right leg  up to an 8, daily   ROS Denies recent fever or chills. Denies sinus pressure, nasal congestion, ear pain or sore throat. Denies chest congestion, productive cough or wheezing. Denies chest pains, palpitations and leg swelling Denies abdominal pain, nausea, vomiting,diarrhea or constipation.   Denies dysuria, frequency, hesitancy or incontinence. Denies joint pain, swelling and limitation in mobility. Denies headaches, seizures, numbness, or tingling. Denies depression, anxiety or insomnia. Denies skin break down or rash.   PE  BP (!) 139/90 (BP Location: Left Arm, Patient Position: Sitting, Cuff Size: Large)   Pulse 94   Ht 5\' 3"  (1.6 m)   Wt 152 lb 0.6 oz (69 kg)   SpO2 97%   BMI 26.93 kg/m   Patient alert and oriented and in no cardiopulmonary distress.  HEENT: No facial asymmetry, EOMI,     Neck supple .  Chest: Clear to auscultation bilaterally.  CVS: S1, S2 no murmurs, no S3.Regular rate.  ABD: Soft non tender.   Ext: No edema  MS: Adequate ROM spine, shoulders, hips and knees.  Skin: Intact, no ulcerations or rash noted.  Psych: Good eye contact, normal affect. Memory intact not anxious or depressed appearing.  CNS: CN 2-12 intact, power,  normal throughout.no focal deficits noted.   Assessment & Plan  No problem-specific Assessment & Plan  notes found for this encounter.

## 2023-01-14 DIAGNOSIS — M25551 Pain in right hip: Secondary | ICD-10-CM | POA: Insufficient documentation

## 2023-01-14 NOTE — Assessment & Plan Note (Signed)
Increased , 5 day course of prednisone , if persists or worsens will refer Ortho

## 2023-01-14 NOTE — Assessment & Plan Note (Signed)
Current flare 5 day course of prednisone prescribed, call if persists or worsens

## 2023-01-19 ENCOUNTER — Ambulatory Visit: Payer: Medicare Other | Admitting: Professional Counselor

## 2023-01-19 DIAGNOSIS — F320 Major depressive disorder, single episode, mild: Secondary | ICD-10-CM

## 2023-01-19 NOTE — BH Specialist Note (Signed)
[place] [facility] Telephone Follow-up  MRN: [id number] NAME: Christina Carroll Date: 01/19/23  Start time: Start Time: 1030 End time: Stop Time: 1100 Total time: Total Time in Minutes (Visit): 30 Call number: Visit Number: 4- Fourth Visit  Reason for call today:  The patient, a 77 year old female, returns for a follow-up in a positive mood. She reports that her symptoms have generally remained reduced, with only a slight increase in anxiety over the past week due to financial stress. She shared a desire to be in a better financial position and expressed that she had hoped for more financial freedom at this stage of her life. Despite these concerns, she has successfully met her initial treatment goals, particularly in managing her anxiety and feeling more comfortable reaching out for support when needed. The patient has a strong inclination to help others and often offers advice, which sometimes leads her to prioritize others over her own needs. She is making progress in recognizing this tendency and has been working on seeking help more readily for herself. Given her stability and positive progress, we will transition to monthly follow-up visits to help her maintain her current mood and continue developing her skills in self-care and boundary setting.  PHQ-9 Scores:     01/12/2023    2:10 PM 01/05/2023    9:21 AM 01/05/2023    9:18 AM 12/07/2022    2:17 PM 11/09/2022    2:16 PM  Depression screen PHQ 2/9  Decreased Interest 0 1 1 1  0  Down, Depressed, Hopeless 0 0 0 1 1  PHQ - 2 Score 0 1 1 2 1   Altered sleeping 0 0 0 0 0  Tired, decreased energy 0 0 0 0 0  Change in appetite 0 0 1 1 2   Feeling bad or failure about yourself  0 2 0 2 3  Trouble concentrating 0 0 0 0 0  Moving slowly or fidgety/restless 0 0 0 0 0  Suicidal thoughts 0 0 0 0 0  PHQ-9 Score 0 3 2 5 6   Difficult doing work/chores Not difficult at all Somewhat difficult  Somewhat difficult    GAD-7 Scores:      01/05/2023    9:20 AM 12/07/2022    2:18 PM 11/09/2022    2:22 PM 07/26/2022    8:33 AM  GAD 7 : Generalized Anxiety Score  Nervous, Anxious, on Edge 1 1 3  0  Control/stop worrying 1 1 2  0  Worry too much - different things 0 1 2 0  Trouble relaxing 0 0 0 0  Restless 0 0 0 0  Easily annoyed or irritable 0 0 0 0  Afraid - awful might happen 0 0 1 0  Total GAD 7 Score 2 3 8  0  Anxiety Difficulty  Somewhat difficult      Stress Current stressors:  arthritis and pain and financial pain Sleep:  Good Appetite:  Good Coping ability:  Fair Patient taking medications as prescribed:  yes  Current medications:  Outpatient Encounter Medications as of 01/19/2023  Medication Sig   Calcium Carbonate-Vitamin D (OYSTER SHELL CALCIUM 500 + D PO) Take 1 tablet by mouth in the morning, at noon, and at bedtime.   Cholecalciferol (VITAMIN D) 50 MCG (2000 UT) CAPS Take 1 capsule (2,000 Units total) by mouth daily.   LINIMENTS EX Apply 1 Application topically daily as needed (pain). Horse Liniment   NIFEdipine (PROCARDIA XL/NIFEDICAL XL) 60 MG 24 hr tablet TAKE 1 TABLET BY MOUTH  DAILY   pravastatin (PRAVACHOL) 80 MG tablet TAKE 1 TABLET BY MOUTH DAILY   predniSONE (DELTASONE) 10 MG tablet Take 1 tablet (10 mg total) by mouth 2 (two) times daily with a meal.   triamterene-hydrochlorothiazide (MAXZIDE-25) 37.5-25 MG tablet TAKE 1 TABLET BY MOUTH DAILY   UNITHROID 50 MCG tablet Take 50 mcg by mouth daily before breakfast.   No facility-administered encounter medications on file as of 01/19/2023.    Self-harm Behaviors Risk Assessment Self-harm risk factors:  None Patient endorses recent thoughts of harming self:  Denies  Danger to Others Risk Assessment Danger to others risk factors:  None Patient endorses recent thoughts of harming others:  Denies   Goals, Interventions and Follow-up Plan Goals: Increase healthy adjustment to current life circumstances Interventions: Behavioral Activation and CBT  Cognitive Behavioral Therapy Follow-up Plan:    Christina Carroll

## 2023-01-25 LAB — CMP14+EGFR
ALT: 22 [IU]/L (ref 0–32)
AST: 23 [IU]/L (ref 0–40)
Albumin: 4.5 g/dL (ref 3.8–4.8)
Alkaline Phosphatase: 102 [IU]/L (ref 44–121)
BUN/Creatinine Ratio: 16 (ref 12–28)
BUN: 17 mg/dL (ref 8–27)
Bilirubin Total: 0.9 mg/dL (ref 0.0–1.2)
CO2: 27 mmol/L (ref 20–29)
Calcium: 10.5 mg/dL — ABNORMAL HIGH (ref 8.7–10.3)
Chloride: 97 mmol/L (ref 96–106)
Creatinine, Ser: 1.05 mg/dL — ABNORMAL HIGH (ref 0.57–1.00)
Globulin, Total: 2.5 g/dL (ref 1.5–4.5)
Glucose: 91 mg/dL (ref 70–99)
Potassium: 4.2 mmol/L (ref 3.5–5.2)
Sodium: 140 mmol/L (ref 134–144)
Total Protein: 7 g/dL (ref 6.0–8.5)
eGFR: 55 mL/min/{1.73_m2} — ABNORMAL LOW (ref >59)

## 2023-01-25 LAB — HEMOGLOBIN A1C
Est. average glucose Bld gHb Est-mCnc: 117 mg/dL
Hgb A1c MFr Bld: 5.7 % — ABNORMAL HIGH (ref 4.8–5.6)

## 2023-01-25 LAB — CBC
Hematocrit: 42.3 % (ref 34.0–46.6)
Hemoglobin: 13.2 g/dL (ref 11.1–15.9)
MCH: 26 pg — ABNORMAL LOW (ref 26.6–33.0)
MCHC: 31.2 g/dL — ABNORMAL LOW (ref 31.5–35.7)
MCV: 83 fL (ref 79–97)
Platelets: 389 10*3/uL (ref 150–450)
RBC: 5.07 x10E6/uL (ref 3.77–5.28)
RDW: 14 % (ref 11.7–15.4)
WBC: 5.8 10*3/uL (ref 3.4–10.8)

## 2023-01-25 LAB — LIPID PANEL
Chol/HDL Ratio: 2.6 ratio (ref 0.0–4.4)
Cholesterol, Total: 221 mg/dL — ABNORMAL HIGH (ref 100–199)
HDL: 86 mg/dL (ref >39)
LDL Chol Calc (NIH): 121 mg/dL — ABNORMAL HIGH (ref 0–99)
Triglycerides: 79 mg/dL (ref 0–149)
VLDL Cholesterol Cal: 14 mg/dL (ref 5–40)

## 2023-01-26 ENCOUNTER — Ambulatory Visit: Payer: Medicare Other | Admitting: Family Medicine

## 2023-02-09 ENCOUNTER — Other Ambulatory Visit: Payer: Self-pay | Admitting: Family Medicine

## 2023-02-28 ENCOUNTER — Encounter: Payer: Self-pay | Admitting: "Endocrinology

## 2023-02-28 ENCOUNTER — Ambulatory Visit (INDEPENDENT_AMBULATORY_CARE_PROVIDER_SITE_OTHER): Payer: Medicare Other | Admitting: "Endocrinology

## 2023-02-28 VITALS — BP 114/76 | HR 68 | Ht 63.0 in | Wt 152.4 lb

## 2023-02-28 DIAGNOSIS — I10 Essential (primary) hypertension: Secondary | ICD-10-CM

## 2023-02-28 DIAGNOSIS — E782 Mixed hyperlipidemia: Secondary | ICD-10-CM

## 2023-02-28 DIAGNOSIS — E039 Hypothyroidism, unspecified: Secondary | ICD-10-CM | POA: Diagnosis not present

## 2023-02-28 DIAGNOSIS — R7303 Prediabetes: Secondary | ICD-10-CM

## 2023-02-28 NOTE — Progress Notes (Signed)
Endocrinology Consult Note                                            02/28/2023, 12:27 PM   Subjective:    Patient ID: Christina Carroll, female    DOB: [birth year], PCP Kerri Perches, MD   Past Medical History:  Diagnosis Date   Arthritis    BACK PAIN 02/02/2010   Qualifier: Diagnosis of  By: Lodema Hong MD, Margaret     Essential hypertension    Hyperlipidemia    Hypertension    Phreesia 07/05/2020   Hypothyroidism    Lymphocytic thyroiditis    Metabolic syndrome X 12/02/2013   NEVUS 05/12/2010   Qualifier: Diagnosis of  By: Lodema Hong MD, Margaret     Obesity    Prediabetes    Primary osteoarthritis, 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   TENDINITIS 02/02/2010   Qualifier: Diagnosis of  By: Lodema Hong MD, Margaret     Thyroid disease    Phreesia 07/05/2020   Past Surgical History:  Procedure Laterality Date   Bilateral foot surgery bone removed  2000   COLONOSCOPY  11/30/2011   Procedure: COLONOSCOPY;  Surgeon: Malissa Hippo, MD;  [facility];  Service: Endoscopy;  Laterality: N/A;  830   COLONOSCOPY WITH PROPOFOL N/A 04/04/2022   Procedure: COLONOSCOPY WITH PROPOFOL;  Surgeon: Dolores Frame, MD;  Location: [facility];  Service: Gastroenterology;  Laterality: N/A;  1:45pm, asa 3   Lumpectomy removed from thoracic spine area  1999   POLYPECTOMY  04/04/2022   Procedure: POLYPECTOMY INTESTINAL;  Surgeon: Dolores Frame, MD;  Location: [facility];  Service: Gastroenterology;;   Social History   Socioeconomic History   Marital status: Divorced    Spouse name: Not on file   Number of children: 1   Years of education: Not on file   Highest education level: Bachelor's degree (e.g., BA, AB, BS)  Occupational History   Occupation: Employed   Tobacco Use   Smoking status: Never   Smokeless tobacco: Never  Substance and Sexual Activity   Alcohol use: No    Alcohol/week: 0.0 standard drinks of alcohol   Drug use: No   Sexual  activity: Not Currently  Other Topics Concern   Not on file  Social History Narrative   Not on file   Social Determinants of Health   Financial Resource Strain: Low Risk  (07/02/2022)   Overall Financial Resource Strain (CARDIA)    Difficulty of Paying Living Expenses: Not hard at all  Food Insecurity: No Food Insecurity (07/25/2022)   Hunger Vital Sign    Worried About Running Out of Food in the Last Year: Never true    Ran Out of Food in the Last Year: Never true  Transportation Needs: No Transportation Needs (07/25/2022)   PRAPARE - Administrator, Civil Service (Medical): No    Lack of Transportation (Non-Medical): No  Physical Activity: Unknown (07/02/2022)   Exercise Vital Sign    Days of Exercise per Week: Patient declined    Minutes of Exercise per Session: Not on file  Stress: No Stress Concern Present (07/02/2022)   [employer] of Occupational Health - Occupational Stress Questionnaire    Feeling of Stress : Only a little  Social Connections: Unknown (07/02/2022)   Social Connection and Isolation Panel [NHANES]  Frequency of Communication with Friends and Family: More than three times a week    Frequency of Social Gatherings with Friends and Family: More than three times a week    Attends Religious Services: Not on Marketing executive or Organizations: Yes    Attends Engineer, structural: More than 4 times per year    Marital Status: Divorced   Family History  Problem Relation Age of Onset   Stroke Mother    Diabetes Mother    Hypertension Mother    Coronary artery disease Father    Arthritis Other    Cancer Other    Breast cancer Maternal Aunt    Hypertension Son    Outpatient Encounter Medications as of 02/28/2023  Medication Sig   Cholecalciferol (VITAMIN D) 50 MCG (2000 UT) CAPS Take 1 capsule (2,000 Units total) by mouth daily.   LINIMENTS EX Apply 1 Application topically daily as needed (pain). Horse Liniment    NIFEdipine (PROCARDIA XL/NIFEDICAL XL) 60 MG 24 hr tablet TAKE 1 TABLET BY MOUTH DAILY   pravastatin (PRAVACHOL) 80 MG tablet TAKE 1 TABLET BY MOUTH DAILY   triamterene-hydrochlorothiazide (MAXZIDE-25) 37.5-25 MG tablet TAKE 1 TABLET BY MOUTH DAILY   UNITHROID 50 MCG tablet Take 50 mcg by mouth daily before breakfast.   [DISCONTINUED] Calcium Carbonate-Vitamin D (OYSTER SHELL CALCIUM 500 + D PO) Take 1 tablet by mouth in the morning, at noon, and at bedtime.   [DISCONTINUED] predniSONE (DELTASONE) 10 MG tablet Take 1 tablet (10 mg total) by mouth 2 (two) times daily with a meal.   No facility-administered encounter medications on file as of 02/28/2023.   ALLERGIES: No Known Allergies  VACCINATION STATUS: Immunization History  Administered Date(s) Administered   Fluad Quad(high Dose 65+) 11/11/2018, 01/05/2020, 12/09/2020, 11/30/2021, 11/25/2022   Influenza Split 02/21/2011, 01/31/2012   Influenza Whole 01/23/2007, 12/21/2008, 02/02/2010   Influenza, High Dose Seasonal PF 12/21/2017   Influenza,inj,Quad PF,6+ Mos 12/24/2013, 01/18/2015, 12/01/2015, 01/18/2017   MODERNA COVID-19 SARS-COV-2 PEDS BIVALENT BOOSTER 14yr-4yr 10/14/2021   Moderna Covid-19 Vaccine Bivalent Booster 69yrs & up 11/28/2022   Moderna SARS-COV2 Booster Vaccination 01/24/2020, 07/08/2020, 12/16/2020   Moderna Sars-Covid-2 Vaccination 04/24/2019, 05/23/2019   Pneumococcal Conjugate-13 09/22/2013   Pneumococcal Polysaccharide-23 04/03/2013   Td 04/10/2005   Tdap 06/29/2014   Zoster Recombinant(Shingrix) 06/05/2017, 09/06/2017   Zoster, Live 04/19/2009    HPI Christina Carroll is 77 y.o. female who presents today with a medical history as above. she is being seen in consultation for hypothyroidism and hypercalcemia  requested by Kerri Perches, MD.  She is not an optimal historian.  Her records were reviewed.  She has seen Dr. Patrecia Pace in the past.  Reportedly, she has taken thyroid hormone for at least 10  years.  She denies prior history of thyroidectomy or thyroid ablation.  She is currently on Unithroid 50 mcg p.o. daily with good consistency and compliance. She does not have recent thyroid function tests. She is also taking calcium carbonate 1 tablet 3 times a day.  She has taken this medication for similar duration of time based on her recollection. Her most recent lab work showed mild to moderate hypercalcemia.  She does not have any parathyroid dysfunction or history of workup.  She denies nephrolithiasis.  She did have normal bone density from May 2023. She denies any history of fragility fractures. Her other medical problems include hyperlipidemia and hypertension on treatment.  She is on vitamin D supplement, 2000 units  daily.  She does not have acute complaints today.  Review of Systems  Constitutional: +mildly fluctuating body weight, no fatigue, no subjective hyperthermia, no subjective hypothermia Eyes: no blurry vision, no xerophthalmia ENT: no sore throat, no nodules palpated in throat, no dysphagia/odynophagia, no hoarseness Cardiovascular: no Chest Pain, no Shortness of Breath, no palpitations, no leg swelling Respiratory: no cough, no shortness of breath Gastrointestinal: no Nausea/Vomiting/Diarhhea Musculoskeletal: no muscle/joint aches Skin: no rashes Neurological: no tremors, no numbness, no tingling, no dizziness Psychiatric: no depression, no anxiety  Objective:       02/28/2023    8:32 AM 01/12/2023    2:38 PM 01/12/2023    2:11 PM  Vitals with BMI  Height 5\' 3"     Weight 152 lbs 6 oz    BMI 27    Systolic 114 124 756  Diastolic 76 82 90  Pulse 68      BP 114/76   Pulse 68   Ht 5\' 3"  (1.6 m)   Wt 152 lb 6.4 oz (69.1 kg)   BMI 27.00 kg/m   Wt Readings from Last 3 Encounters:  02/28/23 152 lb 6.4 oz (69.1 kg)  01/12/23 152 lb 0.6 oz (69 kg)  11/07/22 153 lb (69.4 kg)    Physical Exam  Constitutional:  Body mass index is 27 kg/m.,  not in acute  distress, normal state of mind Eyes: PERRLA, EOMI, no exophthalmos ENT: moist mucous membranes, no gross thyromegaly, no gross cervical lymphadenopathy Cardiovascular: normal precordial activity, Regular Rate and Rhythm, no Murmur/Rubs/Gallops Respiratory:  adequate breathing efforts, no gross chest deformity, Clear to auscultation bilaterally Gastrointestinal: abdomen soft, Non -tender, No distension, Bowel Sounds present, no gross organomegaly Musculoskeletal: no gross deformities, strength intact in all four extremities, no peripheral edema Skin: moist, warm, no rashes Neurological: no tremor with outstretched hands, Deep tendon reflexes normal in bilateral lower extremities.  CMP ( most recent) CMP     Component Value Date/Time   NA 140 01/24/2023 0901   K 4.2 01/24/2023 0901   CL 97 01/24/2023 0901   CO2 27 01/24/2023 0901   GLUCOSE 91 01/24/2023 0901   GLUCOSE 91 05/07/2019 0834   BUN 17 01/24/2023 0901   CREATININE 1.05 (H) 01/24/2023 0901   CREATININE 1.03 (H) 05/07/2019 0834   CALCIUM 10.5 (H) 01/24/2023 0901   PROT 7.0 01/24/2023 0901   ALBUMIN 4.5 01/24/2023 0901   AST 23 01/24/2023 0901   ALT 22 01/24/2023 0901   ALKPHOS 102 01/24/2023 0901   BILITOT 0.9 01/24/2023 0901   EGFR 55 (L) 01/24/2023 0901   GFRNONAA 55 (L) 05/07/2019 0834     Diabetic Labs (most recent): Lab Results  Component Value Date   HGBA1C 5.7 (H) 01/24/2023   HGBA1C 6.0 (H) 11/07/2022   HGBA1C 5.9 (H) 03/23/2022     Lipid Panel ( most recent) Lipid Panel     Component Value Date/Time   CHOL 221 (H) 01/24/2023 0901   TRIG 79 01/24/2023 0901   HDL 86 01/24/2023 0901   CHOLHDL 2.6 01/24/2023 0901   CHOLHDL 2.8 05/07/2019 0834   VLDL 12 08/11/2016 0818   LDLCALC 121 (H) 01/24/2023 0901   LDLCALC 115 (H) 05/07/2019 0834   LABVLDL 14 01/24/2023 0901      Lab Results  Component Value Date   TSH 2.500 11/07/2022     Bone density on Aug 16, 2021 AP Spine L1-L4 08/16/2021 73.8  Normal 2.3 1.456 g/cm2 13.2% Yes AP Spine L1-L4 06/13/2006 58.6 Normal 0.9  1.286 g/cm2 - -   DualFemur Total Left 08/16/2021 73.8 Normal -0.5 0.942 g/cm2 -4.9% Yes DualFemur Total Left 08/21/2018 70.8 Normal -0.1 0.991 g/cm2 1.7% -   DualFemur Total Mean 08/16/2021 73.8 Normal 0.0 1.004 g/cm2 -4.9% Yes DualFemur Total Mean 08/21/2018 70.8 - - 1.056 g/cm2 0.8% -    Left Forearm Radius 33% 08/16/2021 73.8 Normal -0.3 0.689 g/cm2 -3.1% -  ASSESSMENT: The BMD measured at Femur Total Left is 0.942 g/cm2 with a T-score of -0.5. This patient is considered normal according to World Health Organization [facility]) criteria. The scan quality is good. Compared with the prior study on 08/21/2018, the BMD of the total mean shows a statistically significant decrease.  Assessment & Plan:   1. Hypothyroidism, unspecified type  2. Hypercalcemia  3.  Prediabetes   - DASANY HAUER  is being seen at a kind request of Kerri Perches, MD. - I have reviewed her available  records and clinically evaluated the patient. - Based on these reviews, she has longstanding hypothyroidism on thyroid hormone supplement, as well as mild to moderate hypercalcemia while patient is on calcium supplements however,  there is not sufficient information to proceed with definitive treatment plan.  She is advised to continue her Unithroid 50 mcg p.o. daily before breakfast.   - We discussed about the correct intake of her thyroid hormone, on empty stomach at fasting, with water, separated by at least 30 minutes from breakfast and other medications,  and separated by more than 4 hours from calcium, iron, multivitamins, acid reflux medications (PPIs). -Patient is made aware of the fact that thyroid hormone replacement is needed for life, dose to be adjusted by periodic monitoring of thyroid function tests.  It is likely that her calcium supplements are causing hypercalcemia.  She will need workup to rule out parathyroid  dysfunction.   -Her physical exam is negative for goiter, will not need thyroid imaging at this time.  Her next labs will include:  - TSH - T4, free - Thyroid peroxidase antibody - Thyroglobulin antibody - Magnesium - Phosphorus - PTH, intact and calcium - PTH-related peptide  She will be considered for further workup if she presents with labs indicative of  hyperparathyroidism. Regarding her prediabetes: - she acknowledges that there is a room for improvement in her food and drink choices. - Suggestion is made for her to avoid simple carbohydrates  from her diet including Cakes, Sweet Desserts, Ice Cream, Soda (diet and regular), Sweet Tea, Candies, Chips, Cookies, Store Bought Juices, Alcohol , Artificial Sweeteners,  Coffee Creamer, and "Sugar-free" Products, Lemonade. This will help patient to have more stable blood glucose profile and potentially avoid unintended weight gain.   - she is advised to maintain close follow up with Kerri Perches, MD for primary care needs.   -Thank you for involving me in the care of this pleasant patient.  Time spent with the patient: 46  minutes, of which >50% was spent in  counseling her about her hypothyroidism, hypercalcemia, prediabetes and the rest in obtaining information about her symptoms, reviewing her previous labs/studies ( including abstractions from other facilities),  evaluations, and treatments,  and developing a plan to confirm diagnosis and long term treatment based on the latest standards of care/guidelines; and documenting her care.  Christina Carroll participated in the discussions, expressed understanding, and voiced agreement with the above plans.  All questions were answered to her satisfaction. she is encouraged to contact clinic should she have any questions or  concerns prior to her return visit.  Follow up plan: Return in about 5 weeks (around 04/04/2023) for F/U with Pre-visit Labs.   Marquis Lunch, MD [facility] [address] [place], [place] [address] Phone: [phone]  Fax: [phone]     02/28/2023, 12:27 PM  This note was partially dictated with voice recognition software. Similar sounding words can be transcribed inadequately or may not  be corrected upon review.

## 2023-03-09 ENCOUNTER — Ambulatory Visit: Payer: Medicare Other | Admitting: Professional Counselor

## 2023-03-09 DIAGNOSIS — F320 Major depressive disorder, single episode, mild: Secondary | ICD-10-CM | POA: Diagnosis not present

## 2023-03-09 NOTE — BH Specialist Note (Unsigned)
[place] [facility] Telephone Follow-up  MRN: [id number] NAME: NAFIA MIESSE Date: 03/12/23  Start time: Start Time: 0858 End time: Stop Time: 0930 Total time: Total Time in Minutes (Visit): 32 Call number: Visit Number: 5-Fifth Visit  Reason for call today:  The patient is a 77 year old female who presented for a collaborative care follow-up in an overall positive mood. She continues to maintain improved functioning and mood and is now meeting monthly due to her stability. The patient reports finding significant benefit from her earlier sessions and expresses a desire to continue monthly check-ins despite her stable condition. She enjoys discussing topics such as music and her personal interests during therapy.  The sessions now focus on relapse prevention and identifying meaningful, purposeful activities to enhance her quality of life as she navigates aging and occasional depressive doubts. The patient remains actively involved in her church and has a supportive community but values the confidentiality of therapy, where she feels free to discuss anything without judgment. Given her preference for ongoing support, a monthly follow-up schedule will be maintained to ensure she continues to thrive.  PHQ-9 Scores:     01/19/2023   10:42 AM 01/12/2023    2:10 PM 01/05/2023    9:21 AM 01/05/2023    9:18 AM 12/07/2022    2:17 PM  Depression screen PHQ 2/9  Decreased Interest 1 0 1 1 1   Down, Depressed, Hopeless 1 0 0 0 1  PHQ - 2 Score 2 0 1 1 2   Altered sleeping 0 0 0 0 0  Tired, decreased energy 0 0 0 0 0  Change in appetite 0 0 0 1 1  Feeling bad or failure about yourself  1 0 2 0 2  Trouble concentrating 0 0 0 0 0  Moving slowly or fidgety/restless 0 0 0 0 0  Suicidal thoughts 0 0 0 0 0  PHQ-9 Score 3 0 3 2 5   Difficult doing work/chores Somewhat difficult Not difficult at all Somewhat difficult  Somewhat difficult   GAD-7 Scores:     01/19/2023   10:43 AM 01/05/2023     9:20 AM 12/07/2022    2:18 PM 11/09/2022    2:22 PM  GAD 7 : Generalized Anxiety Score  Nervous, Anxious, on Edge 1 1 1 3   Control/stop worrying 1 1 1 2   Worry too much - different things 1 0 1 2  Trouble relaxing 1 0 0 0  Restless 0 0 0 0  Easily annoyed or irritable 0 0 0 0  Afraid - awful might happen 1 0 0 1  Total GAD 7 Score 5 2 3 8   Anxiety Difficulty Somewhat difficult  Somewhat difficult     Stress Current stressors:  Sons health Sleep:  Good Appetite:  Good Coping ability:  Good Patient taking medications as prescribed:  Yes  Current medications:  Outpatient Encounter Medications as of 03/09/2023  Medication Sig   Cholecalciferol (VITAMIN D) 50 MCG (2000 UT) CAPS Take 1 capsule (2,000 Units total) by mouth daily.   LINIMENTS EX Apply 1 Application topically daily as needed (pain). Horse Liniment   NIFEdipine (PROCARDIA XL/NIFEDICAL XL) 60 MG 24 hr tablet TAKE 1 TABLET BY MOUTH DAILY   pravastatin (PRAVACHOL) 80 MG tablet TAKE 1 TABLET BY MOUTH DAILY   triamterene-hydrochlorothiazide (MAXZIDE-25) 37.5-25 MG tablet TAKE 1 TABLET BY MOUTH DAILY   UNITHROID 50 MCG tablet Take 50 mcg by mouth daily before breakfast.   No facility-administered encounter medications on file as  of 03/09/2023.     Self-harm Behaviors Risk Assessment Self-harm risk factors:  None Patient endorses recent thoughts of harming self:  Denies   Danger to Others Risk Assessment Danger to others risk factors:  None Patient endorses recent thoughts of harming others:  Denies   Substance Use Assessment Patient recently consumed alcohol:  No  Alcohol Use Disorder Identification Test (AUDIT):     06/16/2020    9:16 AM 07/02/2022    9:17 AM  Alcohol Use Disorder Test (AUDIT)  1. How often do you have a drink containing alcohol? 0 0  2. How many drinks containing alcohol do you have on a typical day when you are drinking? 0   3. How often do you have six or more drinks on one occasion? 0   AUDIT-C  Score 0   Alcohol Brief Interventions/Follow-up AUDIT Score <7 follow-up not indicated     Goals, Interventions and Follow-up Plan Goals:  Maintain improved mood Interventions: Supportive Counseling Follow-up Plan:  Monthly visits    [name]

## 2023-03-12 ENCOUNTER — Ambulatory Visit (HOSPITAL_COMMUNITY)
Admission: RE | Admit: 2023-03-12 | Discharge: 2023-03-12 | Disposition: A | Discharge status: Home / Self Care | Payer: Medicare Other | Source: Ambulatory Visit | Attending: Family Medicine | Admitting: Family Medicine

## 2023-03-12 DIAGNOSIS — Z1231 Encounter for screening mammogram for malignant neoplasm of breast: Secondary | ICD-10-CM | POA: Insufficient documentation

## 2023-04-03 ENCOUNTER — Other Ambulatory Visit: Payer: Self-pay | Admitting: Family Medicine

## 2023-04-03 ENCOUNTER — Telehealth: Payer: Self-pay | Admitting: Family Medicine

## 2023-04-03 NOTE — Telephone Encounter (Signed)
 error

## 2023-04-05 ENCOUNTER — Ambulatory Visit: Payer: Medicare Other | Admitting: "Endocrinology

## 2023-04-10 LAB — MAGNESIUM: Magnesium: 2 mg/dL (ref 1.6–2.3)

## 2023-04-10 LAB — PTH-RELATED PEPTIDE: PTH-related peptide: <2 pmol/L

## 2023-04-10 LAB — THYROGLOBULIN ANTIBODY: Thyroglobulin Antibody: <1 [IU]/mL (ref 0.0–0.9)

## 2023-04-10 LAB — PTH, INTACT AND CALCIUM
Calcium: 10.6 mg/dL — ABNORMAL HIGH (ref 8.7–10.3)
PTH: 21 pg/mL (ref 15–65)

## 2023-04-10 LAB — PHOSPHORUS: Phosphorus: 3.5 mg/dL (ref 3.0–4.3)

## 2023-04-10 LAB — TSH: TSH: 1.64 u[IU]/mL (ref 0.450–4.500)

## 2023-04-10 LAB — T4, FREE: Free T4: 1.33 ng/dL (ref 0.82–1.77)

## 2023-04-10 LAB — THYROID PEROXIDASE ANTIBODY: Thyroperoxidase Ab SerPl-aCnc: 312 [IU]/mL — ABNORMAL HIGH (ref 0–34)

## 2023-04-16 ENCOUNTER — Telehealth: Payer: Self-pay | Admitting: Family Medicine

## 2023-04-16 NOTE — Telephone Encounter (Signed)
Prescription Request  04/16/2023  LOV: 01/12/2023  What is the name of the medication or equipment? 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 Yes   Which pharmacy would you like this sent to?  [facility] Patient notified that their request is being sent to the clinical staff for review and that they should receive a response within 2 business days.   Please advise at  walk into office

## 2023-04-17 ENCOUNTER — Other Ambulatory Visit: Payer: Self-pay

## 2023-04-17 ENCOUNTER — Other Ambulatory Visit: Payer: Self-pay | Admitting: Family Medicine

## 2023-04-17 MED ORDER — VITAMIN D 50 MCG (2000 UT) PO CAPS: 1.0000 | ORAL_CAPSULE | Freq: Every day | ORAL | 3 refills | Status: DC

## 2023-04-17 MED ORDER — PREDNISONE 5 MG PO TABS: 5.0000 mg | ORAL_TABLET | Freq: Two times a day (BID) | ORAL | 0 refills | Status: AC

## 2023-04-17 NOTE — Telephone Encounter (Signed)
Medication sent.

## 2023-04-17 NOTE — Telephone Encounter (Signed)
Copied from CRM [id number]. Topic: Clinical - Medication Refill >> Apr 17, 2023  9:00 AM Bobbye Morton wrote: Most Recent Primary Care Visit:  Provider: [name]  Department: [facility]  Visit Type: INTEGRATED BH FOLLOW UP  Date: 03/09/2023  Medication: Cholecalciferol (VITAMIN D) 50 MCG (2000 UT) CAPS  Has the patient contacted their pharmacy? Yes (Agent: If no, request that the patient contact the pharmacy for the refill. If patient does not wish to contact the pharmacy document the reason why and proceed with request.) (Agent: If yes, when and what did the pharmacy advise?)  Is this the correct pharmacy for this prescription? Yes If no, delete pharmacy and type the correct one.  This is the patient's preferred pharmacy:  [facility] - [place], [place] - [address] [place] [address] Phone: [phone] Fax: [phone]   Has the prescription been filled recently? No  Is the patient out of the medication? Yes  Has the patient been seen for an appointment in the last year OR does the patient have an upcoming appointment? Yes  Can we respond through MyChart? No  Agent: Please be advised that Rx refills may take up to 3 business days. We ask that you follow-up with your pharmacy.

## 2023-05-02 ENCOUNTER — Ambulatory Visit (INDEPENDENT_AMBULATORY_CARE_PROVIDER_SITE_OTHER): Payer: Medicare Other | Admitting: Family Medicine

## 2023-05-02 ENCOUNTER — Encounter: Payer: Self-pay | Admitting: Family Medicine

## 2023-05-02 VITALS — BP 128/73 | HR 84 | Ht 63.0 in | Wt 151.1 lb

## 2023-05-02 DIAGNOSIS — I10 Essential (primary) hypertension: Secondary | ICD-10-CM

## 2023-05-02 DIAGNOSIS — I83811 Varicose veins of right lower extremities with pain: Secondary | ICD-10-CM

## 2023-05-02 DIAGNOSIS — E785 Hyperlipidemia, unspecified: Secondary | ICD-10-CM

## 2023-05-02 DIAGNOSIS — M1611 Unilateral primary osteoarthritis, right hip: Secondary | ICD-10-CM

## 2023-05-02 DIAGNOSIS — F418 Other specified anxiety disorders: Secondary | ICD-10-CM

## 2023-05-02 DIAGNOSIS — R7303 Prediabetes: Secondary | ICD-10-CM

## 2023-05-02 DIAGNOSIS — E559 Vitamin D deficiency, unspecified: Secondary | ICD-10-CM | POA: Diagnosis not present

## 2023-05-02 DIAGNOSIS — M79604 Pain in right leg: Secondary | ICD-10-CM

## 2023-05-02 DIAGNOSIS — M5431 Sciatica, right side: Secondary | ICD-10-CM

## 2023-05-02 DIAGNOSIS — M25551 Pain in right hip: Secondary | ICD-10-CM

## 2023-05-02 NOTE — Patient Instructions (Addendum)
Annual exam early to mid June, call if ytou need me sooner  You are referred to orthopedics re right lower extremity pain, which I believe is from arthritis and disc disease    Fasting lipid panel, cmp and EGFR, hBA1C and vit D 3 to 5 days before next appointment  Try not to let negative outside forces rob you from genuine satisfaction and purpose in your life  Thanks for choosing [facility], we consider it a privelige to serve you.

## 2023-05-06 DIAGNOSIS — M79604 Pain in right leg: Secondary | ICD-10-CM | POA: Insufficient documentation

## 2023-05-06 DIAGNOSIS — I83811 Varicose veins of right lower extremities with pain: Secondary | ICD-10-CM | POA: Insufficient documentation

## 2023-05-06 NOTE — Progress Notes (Signed)
ROSENDA GEFFRARD     MRN: [id number]      DOB: 11-17-1945  Chief Complaint  Patient presents with   Follow-up    Follow up R leg pain     HPI Ms. Sills is here for follow up and re-evaluation of chronic medical conditions, medication management and review of any available recent lab and radiology data.  Preventive health is updated, specifically  Cancer screening and Immunization.   C/o persistent RLE pain , did not take prednisone as prescribed  ROS Denies recent fever or chills. Denies sinus pressure, nasal congestion, ear pain or sore throat. Denies chest congestion, productive cough or wheezing. Denies chest pains, palpitations and leg swelling Denies abdominal pain, nausea, vomiting,diarrhea or constipation.   Denies dysuria, frequency, hesitancy or incontinence.Denies skin break down or rash.   PE  BP 128/73 (BP Location: Right Arm, Patient Position: Sitting, Cuff Size: Normal)   Pulse 84   Ht 5\' 3"  (1.6 m)   Wt 151 lb 1.9 oz (68.5 kg)   SpO2 98%   BMI 26.77 kg/m   Patient alert and oriented and in no cardiopulmonary distress.  HEENT: No facial asymmetry, EOMI,     Neck supple .  Chest: Clear to auscultation bilaterally.  CVS: S1, S2 no murmurs, no S3.Regular rate.  ABD: Soft non tender.   Ext: No edema  MS: Decreased ROM spine,  adequate in shoulders, hips and knees.  Skin: Intact, no ulcerations or rash noted.  Psych: Good eye contact, normal affect. Memory intact not anxious or depressed appearing.  CNS: CN 2-12 intact, power,  normal throughout.no focal deficits noted.   Assessment & Plan  Sciatica of right side Peristent , has not followed management plan, refer Ortho for eval andmnmagement  Pain of right lower extremity Ongoing and disabling at times, Ortho to eval and manage., pt did not follow management plan I ha recommended and continues to be symptomatic  Right hip pain Reports right hip pain with limited mobility at times ,  ortho eval  Prediabetes Patient educated about the importance of limiting  Carbohydrate intake , the need to commit to daily physical activity for a minimum of 30 minutes , and to commit weight loss. The fact that changes in all these areas will reduce or eliminate all together the development of diabetes is stressed.      Latest Ref Rng & Units 01/24/2023    9:01 AM 11/07/2022    4:46 PM 03/23/2022    8:49 AM 12/09/2020    8:44 AM 05/07/2019    8:34 AM  Diabetic Labs  HbA1c 4.8 - 5.6 % 5.7  6.0  5.9  5.7  5.9   Chol 100 - 199 mg/dL 409  811  914  782  956   HDL >39 mg/dL 86  95  86  92  72   Calc LDL 0 - 99 mg/dL 213  086  578  469  629   Triglycerides 0 - 149 mg/dL 79  84  61  69  84   Creatinine 0.57 - 1.00 mg/dL 5.28  4.13  2.44  0.10  1.03       05/02/2023    2:17 PM 02/28/2023    8:32 AM 01/12/2023    2:38 PM 01/12/2023    2:11 PM 01/12/2023    2:09 PM 11/07/2022    3:50 PM 07/26/2022    8:47 AM  BP/Weight  Systolic BP 128 114 124 139 151 136 124  Diastolic BP 73 76 82 90 88 80 72  Wt. (Lbs) 151.12 152.4   152.04 153   BMI 26.77 kg/m2 27 kg/m2   26.93 kg/m2 27.1 kg/m2        No data to display          Improving Updated lab needed at/ before next visit.   Hyperlipidemia Hyperlipidemia:Low fat diet discussed and encouraged.   Lipid Panel  Lab Results  Component Value Date   CHOL 221 (H) 01/24/2023   HDL 86 01/24/2023   LDLCALC 121 (H) 01/24/2023   TRIG 79 01/24/2023   CHOLHDL 2.6 01/24/2023     Updated lab needed at/ before next visit. Needs to reduce fried and fatty foods  Essential hypertension, benign Controlled, no change in medication DASH diet and commitment to daily physical activity for a minimum of 30 minutes discussed and encouraged, as a part of hypertension management. The importance of attaining a healthy weight is also discussed.     05/02/2023    2:17 PM 02/28/2023    8:32 AM 01/12/2023    2:38 PM 01/12/2023    2:11 PM 01/12/2023     2:09 PM 11/07/2022    3:50 PM 07/26/2022    8:47 AM  BP/Weight  Systolic BP 128 114 124 139 151 136 124  Diastolic BP 73 76 82 90 88 80 72  Wt. (Lbs) 151.12 152.4   152.04 153   BMI 26.77 kg/m2 27 kg/m2   26.93 kg/m2 27.1 kg/m2        Depression with anxiety Improved with therapy

## 2023-05-06 NOTE — Assessment & Plan Note (Signed)
Patient educated about the importance of limiting  Carbohydrate intake , the need to commit to daily physical activity for a minimum of 30 minutes , and to commit weight loss. The fact that changes in all these areas will reduce or eliminate all together the development of diabetes is stressed.      Latest Ref Rng & Units 01/24/2023    9:01 AM 11/07/2022    4:46 PM 03/23/2022    8:49 AM 12/09/2020    8:44 AM 05/07/2019    8:34 AM  Diabetic Labs  HbA1c 4.8 - 5.6 % 5.7  6.0  5.9  5.7  5.9   Chol 100 - 199 mg/dL 161  096  045  409  811   HDL >39 mg/dL 86  95  86  92  72   Calc LDL 0 - 99 mg/dL 914  782  956  213  086   Triglycerides 0 - 149 mg/dL 79  84  61  69  84   Creatinine 0.57 - 1.00 mg/dL 5.78  4.69  6.29  5.28  1.03       05/02/2023    2:17 PM 02/28/2023    8:32 AM 01/12/2023    2:38 PM 01/12/2023    2:11 PM 01/12/2023    2:09 PM 11/07/2022    3:50 PM 07/26/2022    8:47 AM  BP/Weight  Systolic BP 128 114 124 139 151 136 124  Diastolic BP 73 76 82 90 88 80 72  Wt. (Lbs) 151.12 152.4   152.04 153   BMI 26.77 kg/m2 27 kg/m2   26.93 kg/m2 27.1 kg/m2        No data to display          Improving Updated lab needed at/ before next visit.

## 2023-05-06 NOTE — Assessment & Plan Note (Signed)
Ongoing and disabling at times, Ortho to eval and manage., pt did not follow management plan I ha recommended and continues to be symptomatic

## 2023-05-06 NOTE — Assessment & Plan Note (Signed)
Peristent , has not followed management plan, refer Ortho for eval andmnmagement

## 2023-05-06 NOTE — Assessment & Plan Note (Signed)
Reports right hip pain with limited mobility at times , ortho eval

## 2023-05-06 NOTE — Assessment & Plan Note (Signed)
Hyperlipidemia:Low fat diet discussed and encouraged.   Lipid Panel  Lab Results  Component Value Date   CHOL 221 (H) 01/24/2023   HDL 86 01/24/2023   LDLCALC 121 (H) 01/24/2023   TRIG 79 01/24/2023   CHOLHDL 2.6 01/24/2023     Updated lab needed at/ before next visit. Needs to reduce fried and fatty foods

## 2023-05-06 NOTE — Assessment & Plan Note (Signed)
 Improved with therapy

## 2023-05-06 NOTE — Assessment & Plan Note (Addendum)
Controlled, no change in medication DASH diet and commitment to daily physical activity for a minimum of 30 minutes discussed and encouraged, as a part of hypertension management. The importance of attaining a healthy weight is also discussed.     05/02/2023    2:17 PM 02/28/2023    8:32 AM 01/12/2023    2:38 PM 01/12/2023    2:11 PM 01/12/2023    2:09 PM 11/07/2022    3:50 PM 07/26/2022    8:47 AM  BP/Weight  Systolic BP 128 114 124 139 151 136 124  Diastolic BP 73 76 82 90 88 80 72  Wt. (Lbs) 151.12 152.4   152.04 153   BMI 26.77 kg/m2 27 kg/m2   26.93 kg/m2 27.1 kg/m2

## 2023-05-09 ENCOUNTER — Ambulatory Visit: Payer: Medicare Other | Admitting: "Endocrinology

## 2023-05-22 ENCOUNTER — Ambulatory Visit: Payer: Medicare Other | Admitting: "Endocrinology

## 2023-05-26 ENCOUNTER — Other Ambulatory Visit: Payer: Self-pay | Admitting: Family Medicine

## 2023-06-04 ENCOUNTER — Encounter: Payer: Self-pay | Admitting: "Endocrinology

## 2023-06-04 ENCOUNTER — Ambulatory Visit (INDEPENDENT_AMBULATORY_CARE_PROVIDER_SITE_OTHER): Admitting: "Endocrinology

## 2023-06-04 VITALS — BP 120/74 | HR 72 | Ht 63.0 in | Wt 146.0 lb

## 2023-06-04 DIAGNOSIS — E782 Mixed hyperlipidemia: Secondary | ICD-10-CM

## 2023-06-04 DIAGNOSIS — R7303 Prediabetes: Secondary | ICD-10-CM | POA: Diagnosis not present

## 2023-06-04 DIAGNOSIS — E063 Autoimmune thyroiditis: Secondary | ICD-10-CM | POA: Diagnosis not present

## 2023-06-04 MED ORDER — UNITHROID 50 MCG PO TABS: 50.0000 ug | ORAL_TABLET | Freq: Every day | ORAL | 1 refills | Status: DC

## 2023-06-04 NOTE — Progress Notes (Signed)
 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   TENDINITIS 02/02/2010   Qualifier: Diagnosis of  By: Christina Carroll     Thyroid disease    Phreesia 07/05/2020   Past Surgical History:  Procedure Laterality Date   Bilateral foot surgery bone removed  2000   COLONOSCOPY  11/30/2011   Procedure: COLONOSCOPY;  Surgeon: Christina Hippo, MD;  Location: [facility];  Service: Endoscopy;  Laterality: N/A;  830   COLONOSCOPY WITH PROPOFOL N/A 04/04/2022   Procedure: COLONOSCOPY WITH PROPOFOL;  Surgeon: Christina Frame, MD;  Location: [facility];  Service: Gastroenterology;  Laterality: N/A;  1:45pm, asa 3   Lumpectomy removed from thoracic spine area  1999   POLYPECTOMY  04/04/2022   Procedure: POLYPECTOMY INTESTINAL;  Surgeon: Christina Frame, MD;  Location: [facility];  Service: Gastroenterology;;   Social History   Socioeconomic History   Marital status: Divorced    Spouse name: Not on file   Number of children: 1   Years of education: Not on file   Highest education level: Bachelor's degree (e.g., BA, AB, BS)  Occupational History   Occupation: Employed   Tobacco Use   Smoking status: Never   Smokeless tobacco: Never  Substance and Sexual Activity   Alcohol use: No    Alcohol/week: 0.0 standard drinks of alcohol   Drug use: No   Sexual  activity: Not Currently  Other Topics Concern   Not on file  Social History Narrative   Not on file   Social Drivers of Health   Financial Resource Strain: Low Risk  (07/02/2022)   Overall Financial Resource Strain (CARDIA)    Difficulty of Paying Living Expenses: Not hard at all  Food Insecurity: No Food Insecurity (07/25/2022)   Hunger Vital Sign    Worried About Running Out of Food in the Last Year: Never true    Ran Out of Food in the Last Year: Never true  Transportation Needs: No Transportation Needs (07/25/2022)   PRAPARE - Administrator, Civil Service (Medical): No    Lack of Transportation (Non-Medical): No  Physical Activity: Unknown (07/02/2022)   Exercise Vital Sign    Days of Exercise per Week: Patient declined    Minutes of Exercise per Session: Not on file  Stress: No Stress Concern Present (07/02/2022)   Christina Carroll [employer] Occupational Health - Occupational Stress Questionnaire    Feeling of Stress : Only a little  Social Connections: Unknown (07/02/2022)   Social Connection and Isolation Panel [NHANES]    Frequency of Communication  with Friends and Family: More than three times a week    Frequency of Social Gatherings with Friends and Family: More than three times a week    Attends Religious Services: Not on Marketing executive or Organizations: Yes    Attends Engineer, structural: More than 4 times per year    Marital Status: Divorced   Family History  Problem Relation Age of Onset   Stroke Mother    Diabetes Mother    Hypertension Mother    Coronary artery disease Father    Arthritis Other    Cancer Other    Breast cancer Maternal Aunt    Hypertension Son    Outpatient Encounter Medications as of 06/04/2023  Medication Sig   Cholecalciferol (VITAMIN D) 50 MCG (2000 UT) CAPS Take 1 capsule (2,000 Units total) by mouth daily.   LINIMENTS EX Apply 1 Application topically daily as needed (pain). Horse Liniment   NIFEdipine  (PROCARDIA XL/NIFEDICAL XL) 60 MG 24 hr tablet TAKE 1 TABLET BY MOUTH DAILY   pravastatin (PRAVACHOL) 80 MG tablet TAKE 1 TABLET BY MOUTH DAILY   triamterene-hydrochlorothiazide (MAXZIDE-25) 37.5-25 MG tablet TAKE 1 TABLET BY MOUTH DAILY   UNITHROID 50 MCG tablet Take 1 tablet (50 mcg total) by mouth daily before breakfast.   [DISCONTINUED] UNITHROID 50 MCG tablet Take 50 mcg by mouth daily before breakfast.   No facility-administered encounter medications on file as of 06/04/2023.   ALLERGIES: No Known Allergies  VACCINATION STATUS: Immunization History  Administered Date(s) Administered   Fluad Quad(high Dose 65+) 11/11/2018, 01/05/2020, 12/09/2020, 11/30/2021, 11/25/2022   Influenza Split 02/21/2011, 01/31/2012   Influenza Whole 01/23/2007, 12/21/2008, 02/02/2010   Influenza, High Dose Seasonal PF 12/21/2017   Influenza,inj,Quad PF,6+ Mos 12/24/2013, 01/18/2015, 12/01/2015, 01/18/2017   MODERNA COVID-19 SARS-COV-2 PEDS BIVALENT BOOSTER 54yr-3yr 10/14/2021   Moderna Covid-19 Vaccine Bivalent Booster 26yrs & up 11/28/2022   Moderna SARS-COV2 Booster Vaccination 01/24/2020, 07/08/2020, 12/16/2020   Moderna Sars-Covid-2 Vaccination 04/24/2019, 05/23/2019   Pneumococcal Conjugate-13 09/22/2013   Pneumococcal Polysaccharide-23 04/03/2013   Td 04/10/2005   Tdap 06/29/2014   Zoster Recombinant(Shingrix) 06/05/2017, 09/06/2017   Zoster, Live 04/19/2009    HPI [name] is 78 y.o. female who presents today with a medical history as above. she is being seen in follow-up after she was seen in consultation for hypothyroidism and hypercalcemia  requested by Christina Perches, MD.  She is not an optimal historian.  She came alone to clinic today.  See notes from previous visit.   She ran out of her thyroid hormone for 2 to 3 weeks.  She did not do previsit labs.  Her last measurement calcium was better than her previous visit calcium.  She was supposed to be on Unithroid 50 mcg  p.o. daily before  breakfast.  She has seen Dr. Patrecia Pace in the past.  Reportedly, she has taken thyroid hormone for at least 10 years.  She denies prior history of thyroidectomy or thyroid ablation.    She does not have recent thyroid function tests. She was taken off of her calcium supplements during her last visit.   Her most recent lab work showed mild to moderate hypercalcemia.  She does not have any parathyroid dysfunction or history of workup.  She denies nephrolithiasis.   Her labs from April 03, 2023 showed calcium 10.6, PTH 21. She did have normal bone density from May 2023. She denies any history of fragility fractures. Her other medical problems  include hyperlipidemia and hypertension on treatment.  She is on vitamin D supplement, 2000 units daily.  She does not have acute complaints today.  Review of Systems  Constitutional: +mildly fluctuating body weight, no fatigue, no subjective hyperthermia, no subjective hypothermia   Objective:       06/04/2023    8:38 AM 05/02/2023    2:17 PM 02/28/2023    8:32 AM  Vitals with BMI  Height 5\' 3"  5\' 3"  5\' 3"   Weight 146 lbs 151 lbs 2 oz 152 lbs 6 oz  BMI 25.87 26.78 27  Systolic 120 128 161  Diastolic 74 73 76  Pulse 72 84 68    BP 120/74   Pulse 72   Ht 5\' 3"  (1.6 m)   Wt 146 lb (66.2 kg)   BMI 25.86 kg/m   Wt Readings from Last 3 Encounters:  06/04/23 146 lb (66.2 kg)  05/02/23 151 lb 1.9 oz (68.5 kg)  02/28/23 152 lb 6.4 oz (69.1 kg)    Physical Exam  Constitutional:  Body mass index is 25.86 kg/m.,  not in acute distress, ,  +patient with significant cognitive deficit. Eyes: PERRLA, EOMI, no exophthalmos ENT: moist mucous membranes, no gross thyromegaly, no gross cervical lymphadenopathy  Musculoskeletal: no gross deformities, strength intact in all four extremities, no peripheral edema Skin: moist, warm, no rashes Neurological: no tremor with outstretched hands, Deep tendon reflexes normal in bilateral  lower extremities.  CMP ( most recent) CMP     Component Value Date/Time   NA 140 01/24/2023 0901   K 4.2 01/24/2023 0901   CL 97 01/24/2023 0901   CO2 27 01/24/2023 0901   GLUCOSE 91 01/24/2023 0901   GLUCOSE 91 05/07/2019 0834   BUN 17 01/24/2023 0901   CREATININE 1.05 (H) 01/24/2023 0901   CREATININE 1.03 (H) 05/07/2019 0834   CALCIUM 10.6 (H) 04/03/2023 1103   PROT 7.0 01/24/2023 0901   ALBUMIN 4.5 01/24/2023 0901   AST 23 01/24/2023 0901   ALT 22 01/24/2023 0901   ALKPHOS 102 01/24/2023 0901   BILITOT 0.9 01/24/2023 0901   EGFR 55 (L) 01/24/2023 0901   GFRNONAA 55 (L) 05/07/2019 0834     Diabetic Labs (most recent): Lab Results  Component Value Date   HGBA1C 5.7 (H) 01/24/2023   HGBA1C 6.0 (H) 11/07/2022   HGBA1C 5.9 (H) 03/23/2022     Lipid Panel ( most recent) Lipid Panel     Component Value Date/Time   CHOL 221 (H) 01/24/2023 0901   TRIG 79 01/24/2023 0901   HDL 86 01/24/2023 0901   CHOLHDL 2.6 01/24/2023 0901   CHOLHDL 2.8 05/07/2019 0834   VLDL 12 08/11/2016 0818   LDLCALC 121 (H) 01/24/2023 0901   LDLCALC 115 (H) 05/07/2019 0834   LABVLDL 14 01/24/2023 0901      Lab Results  Component Value Date   TSH 1.640 04/03/2023   TSH 2.500 11/07/2022   FREET4 1.33 04/03/2023     Bone density on Aug 16, 2021 [facility] Spine L1-L4 08/16/2021 73.8 Normal 2.3 1.456 g/cm2 13.2% Yes [facility] Spine L1-L4 06/13/2006 58.6 Normal 0.9 1.286 g/cm2 - -   DualFemur Total Left 08/16/2021 73.8 Normal -0.5 0.942 g/cm2 -4.9% Yes DualFemur Total Left 08/21/2018 70.8 Normal -0.1 0.991 g/cm2 1.7% -   DualFemur Total Mean 08/16/2021 73.8 Normal 0.0 1.004 g/cm2 -4.9% Yes DualFemur Total Mean 08/21/2018 70.8 - - 1.056 g/cm2 0.8% -    Left Forearm Radius 33% 08/16/2021 73.8 Normal -0.3 0.689 g/cm2 -3.1% -  ASSESSMENT: The BMD measured at Femur Total Left is 0.942 g/cm2 with a T-score of -0.5. This patient is considered normal according to World Health Organization [facility])  criteria. The scan quality is good. Compared with the prior study on 08/21/2018, the BMD of the total mean shows a statistically significant decrease.  Assessment & Plan:   1. Hypothyroidism, unspecified type  2. Hypercalcemia  3.  Prediabetes   - I have reviewed her new and available  records and clinically evaluated the patient. - Based on these reviews, she has longstanding hypothyroidism on thyroid hormone supplement, for which she was supposed to be on thyroid hormone supplement.  I discussed and refilled her Unithroid 50 mcg p.o. daily before breakfast.   - We discussed about the correct intake of her thyroid hormone, on empty stomach at fasting, with water, separated by at least 30 minutes from breakfast and other medications,  and separated by more than 4 hours from calcium, iron, multivitamins, acid reflux medications (PPIs). -Patient is made aware of the fact that thyroid hormone replacement is needed for life, dose to be adjusted by periodic monitoring of thyroid function tests.   -Regarding her moderate hypercalcemia ; she was advised to stay off of calcium supplements.  Her labs in January showed slight improvement in her calcium to 10.6 associated with PTH of 21.  She is not a surgical candidate for now. FHH is not ruled out.  I discussed and ordered 24-hour urine calcium measurement for her to complete before her return.   -Her physical exam is negative for goiter, will not need thyroid imaging at this time. Regarding her prediabetes: - she acknowledges that there is a room for improvement in her food and drink choices. - Suggestion is made for her to avoid simple carbohydrates  from her diet including Cakes, Sweet Desserts, Ice Cream, Soda (diet and regular), Sweet Tea, Candies, Chips, Cookies, Store Bought Juices, Alcohol , Artificial Sweeteners,  Coffee Creamer, and "Sugar-free" Products, Lemonade. This will help patient to have more stable blood glucose profile and potentially  avoid unintended weight gain.  She is advised to continue pravastatin 80 mg p.o. nightly, as well as triamterene-hydrochlorothiazide 37.5-25 mg p.o. daily with breakfast for hypertension.  -Patient seems to have mild to moderate cognitive deficit, may benefit from social work assessment.  she is advised to maintain close follow up with Christina Perches, MD for primary care needs.  -  This note was partially dictated with voice recognition software. Similar sounding words can be transcribed inadequately or may not  be corrected upon review.   Follow up plan: Return in about 3 weeks (around 06/25/2023) for Labs Today- Non-Fasting Ok, 24 Hr Urine Ca & Cr.   Marquis Lunch, MD [facility] [address] [place], [place] [address] Phone: [phone]  Fax: [phone]     06/04/2023, 9:56 AM  This note was partially dictated with voice recognition software. Similar sounding words can be transcribed inadequately or may not  be corrected upon review.

## 2023-06-05 LAB — PTH, INTACT AND CALCIUM
Calcium: 10.5 mg/dL — ABNORMAL HIGH (ref 8.7–10.3)
PTH: 36 pg/mL (ref 15–65)

## 2023-06-05 LAB — T4, FREE: Free T4: 1.01 ng/dL (ref 0.82–1.77)

## 2023-06-05 LAB — LIPID PANEL
Chol/HDL Ratio: 2.7 ratio (ref 0.0–4.4)
Cholesterol, Total: 244 mg/dL — ABNORMAL HIGH (ref 100–199)
HDL: 89 mg/dL (ref >39)
LDL Chol Calc (NIH): 141 mg/dL — ABNORMAL HIGH (ref 0–99)
Triglycerides: 85 mg/dL (ref 0–149)
VLDL Cholesterol Cal: 14 mg/dL (ref 5–40)

## 2023-06-05 LAB — TSH: TSH: 2.75 u[IU]/mL (ref 0.450–4.500)

## 2023-06-07 ENCOUNTER — Ambulatory Visit: Admitting: "Endocrinology

## 2023-06-14 LAB — CALCIUM, URINE, 24 HOUR
Calcium, 24H Urine: 14 mg/(24.h) (ref 0–320)
Calcium, Urine: 1 mg/dL

## 2023-06-14 LAB — CREATININE, URINE, 24 HOUR
Creatinine, 24H Ur: 511 mg/(24.h) — ABNORMAL LOW (ref 800–1800)
Creatinine, Urine: 36.5 mg/dL

## 2023-06-15 ENCOUNTER — Telehealth: Payer: Self-pay

## 2023-06-15 NOTE — Telephone Encounter (Signed)
Tried to return call to pt but did not receive an answer and was unable to leave a message.

## 2023-06-25 ENCOUNTER — Ambulatory Visit (INDEPENDENT_AMBULATORY_CARE_PROVIDER_SITE_OTHER): Admitting: "Endocrinology

## 2023-06-25 ENCOUNTER — Encounter: Payer: Self-pay | Admitting: "Endocrinology

## 2023-06-25 VITALS — BP 138/82 | HR 64 | Ht 63.0 in | Wt 144.4 lb

## 2023-06-25 DIAGNOSIS — E063 Autoimmune thyroiditis: Secondary | ICD-10-CM | POA: Diagnosis not present

## 2023-06-25 DIAGNOSIS — E782 Mixed hyperlipidemia: Secondary | ICD-10-CM | POA: Diagnosis not present

## 2023-06-25 NOTE — Progress Notes (Signed)
 06/25/2023, 3:37 PM   Endocrinology follow-up note  Subjective:    Patient ID: Christina Carroll, female    DOB: [birth year], PCP Christina Perches, MD   Past Medical History:  Diagnosis Date   Arthritis    BACK PAIN 02/02/2010   Qualifier: Diagnosis of  By: Christina Carroll     Essential hypertension    Hyperlipidemia    Hypertension    Phreesia 07/05/2020   Hypothyroidism    Lymphocytic thyroiditis    Metabolic syndrome X 12/02/2013   NEVUS 05/12/2010   Qualifier: Diagnosis of  By: Christina Carroll     Obesity    Prediabetes    Primary osteoarthritis, 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   TENDINITIS 02/02/2010   Qualifier: Diagnosis of  By: Christina Carroll     Thyroid disease    Phreesia 07/05/2020   Past Surgical History:  Procedure Laterality Date   Bilateral foot surgery bone removed  2000   COLONOSCOPY  11/30/2011   Procedure: COLONOSCOPY;  Surgeon: Christina Hippo, MD;  [facility];  Service: Endoscopy;  Laterality: N/A;  830   COLONOSCOPY WITH PROPOFOL N/A 04/04/2022   Procedure: COLONOSCOPY WITH PROPOFOL;  Surgeon: Christina Frame, MD;  Location: [facility];  Service: Gastroenterology;  Laterality: N/A;  1:45pm, asa 3   Lumpectomy removed from thoracic spine area  1999   POLYPECTOMY  04/04/2022   Procedure: POLYPECTOMY INTESTINAL;  Surgeon: Christina Frame, MD;  Location: [facility];  Service: Gastroenterology;;   Social History   Socioeconomic History   Marital status: Divorced    Spouse name: Not on file   Number of children: 1   Years of education: Not on file   Highest education level: Bachelor's degree (e.g., BA, AB, BS)  Occupational History   Occupation: Employed   Tobacco Use   Smoking status: Never   Smokeless tobacco: Never  Substance and Sexual Activity   Alcohol use: No    Alcohol/week: 0.0 standard drinks of alcohol   Drug use: No   Sexual  activity: Not Currently  Other Topics Concern   Not on file  Social History Narrative   Not on file   Social Drivers of Health   Financial Resource Strain: Low Risk  (07/02/2022)   Overall Financial Resource Strain (CARDIA)    Difficulty of Paying Living Expenses: Not hard at all  Food Insecurity: No Food Insecurity (07/25/2022)   Hunger Vital Sign    Worried About Running Out of Food in the Last Year: Never true    Ran Out of Food in the Last Year: Never true  Transportation Needs: No Transportation Needs (07/25/2022)   PRAPARE - Administrator, Civil Service (Medical): No    Lack of Transportation (Non-Medical): No  Physical Activity: Unknown (07/02/2022)   Exercise Vital Sign    Days of Exercise per Week: Patient declined    Minutes of Exercise per Session: Not on file  Stress: No Stress Concern Present (07/02/2022)   Christina Carroll [employer] Occupational Health - Occupational Stress Questionnaire    Feeling of Stress : Only a little  Social Connections: Unknown (07/02/2022)   Social Connection and Isolation Panel [NHANES]    Frequency of Communication  with Friends and Family: More than three times a week    Frequency of Social Gatherings with Friends and Family: More than three times a week    Attends Religious Services: Not on Marketing executive or Organizations: Yes    Attends Engineer, structural: More than 4 times per year    Marital Status: Divorced   Family History  Problem Relation Age of Onset   Stroke Mother    Diabetes Mother    Hypertension Mother    Coronary artery disease Father    Arthritis Other    Cancer Other    Breast cancer Maternal Aunt    Hypertension Son    Outpatient Encounter Medications as of 06/25/2023  Medication Sig   Cholecalciferol (VITAMIN D) 50 MCG (2000 UT) CAPS Take 1 capsule (2,000 Units total) by mouth daily.   LINIMENTS EX Apply 1 Application topically daily as needed (pain). Horse Liniment   NIFEdipine  (PROCARDIA XL/NIFEDICAL XL) 60 MG 24 hr tablet TAKE 1 TABLET BY MOUTH DAILY   pravastatin (PRAVACHOL) 80 MG tablet TAKE 1 TABLET BY MOUTH DAILY   triamterene-hydrochlorothiazide (MAXZIDE-25) 37.5-25 MG tablet TAKE 1 TABLET BY MOUTH DAILY   UNITHROID 50 MCG tablet Take 1 tablet (50 mcg total) by mouth daily before breakfast.   No facility-administered encounter medications on file as of 06/25/2023.   ALLERGIES: No Known Allergies  VACCINATION STATUS: Immunization History  Administered Date(s) Administered   Fluad Quad(high Dose 65+) 11/11/2018, 01/05/2020, 12/09/2020, 11/30/2021, 11/25/2022   Influenza Split 02/21/2011, 01/31/2012   Influenza Whole 01/23/2007, 12/21/2008, 02/02/2010   Influenza, High Dose Seasonal PF 12/21/2017   Influenza,inj,Quad PF,6+ Mos 12/24/2013, 01/18/2015, 12/01/2015, 01/18/2017   MODERNA COVID-19 SARS-COV-2 PEDS BIVALENT BOOSTER 48yr-27yr 10/14/2021   Moderna Covid-19 Vaccine Bivalent Booster 52yrs & up 11/28/2022   Moderna SARS-COV2 Booster Vaccination 01/24/2020, 07/08/2020, 12/16/2020   Moderna Sars-Covid-2 Vaccination 04/24/2019, 05/23/2019   Pneumococcal Conjugate-13 09/22/2013   Pneumococcal Polysaccharide-23 04/03/2013   Td 04/10/2005   Tdap 06/29/2014   Zoster Recombinant(Shingrix) 06/05/2017, 09/06/2017   Zoster, Live 04/19/2009    HPI Christina Carroll is 78 y.o. female who presents today with a medical history as above. she is being seen in follow-up after she was seen in consultation for hypothyroidism and hypercalcemia  requested by Christina Perches, MD.  She is not an optimal historian.  She came alone to clinic today.  See notes from previous visit.   She has been more consistent this time taking her thyroid hormone.  Her previsit labs are consistent with appropriate replacement.    She has seen Christina Carroll in the past.  Reportedly, she has taken thyroid hormone for at least 10 years.  She denies prior history of thyroidectomy or  thyroid ablation.    She does not have recent thyroid function tests. She remains off of calcium supplements since last visit.  Her previsit labs show mild hypercalcemia 10.5 associated with inappropriately normal PTH of 36.  Her previsit 24-hour urine calcium was only 14 likely inadequate collection.  She does not have any parathyroid dysfunction or history of workup.  She denies nephrolithiasis.   Her labs from April 03, 2023 showed calcium 10.6, PTH 21. She did have normal bone density from May 2023. She denies any history of fragility fractures. Her other medical problems include hyperlipidemia and hypertension on treatment.  She is on vitamin D supplement, 2000 units daily.  She does not have acute complaints today.  Review of  Systems  Constitutional: +mildly fluctuating body weight, no fatigue, no subjective hyperthermia, no subjective hypothermia   Objective:       06/25/2023    9:38 AM 06/04/2023    8:38 AM 05/02/2023    2:17 PM  Vitals with BMI  Height 5\' 3"  5\' 3"  5\' 3"   Weight 144 lbs 6 oz 146 lbs 151 lbs 2 oz  BMI 25.59 25.87 26.78  Systolic 138 120 604  Diastolic 82 74 73  Pulse 64 72 84    BP 138/82   Pulse 64   Ht 5\' 3"  (1.6 m)   Wt 144 lb 6.4 oz (65.5 kg)   BMI 25.58 kg/m   Wt Readings from Last 3 Encounters:  06/25/23 144 lb 6.4 oz (65.5 kg)  06/04/23 146 lb (66.2 kg)  05/02/23 151 lb 1.9 oz (68.5 kg)    Physical Exam  Constitutional:  Body mass index is 25.58 kg/m.,  not in acute distress, ,  +patient with significant cognitive deficit. Eyes: PERRLA, EOMI, no exophthalmos   Musculoskeletal: no gross deformities, strength intact in all four extremities, no peripheral edema Skin: moist, warm, no rashes Neurological: no tremor with outstretched hands, Deep tendon reflexes normal in bilateral lower extremities.  CMP ( most recent) CMP     Component Value Date/Time   NA 140 01/24/2023 0901   K 4.2 01/24/2023 0901   CL 97 01/24/2023 0901   CO2  27 01/24/2023 0901   GLUCOSE 91 01/24/2023 0901   GLUCOSE 91 05/07/2019 0834   BUN 17 01/24/2023 0901   CREATININE 1.05 (H) 01/24/2023 0901   CREATININE 1.03 (H) 05/07/2019 0834   CALCIUM 10.5 (H) 06/04/2023 0930   PROT 7.0 01/24/2023 0901   ALBUMIN 4.5 01/24/2023 0901   AST 23 01/24/2023 0901   ALT 22 01/24/2023 0901   ALKPHOS 102 01/24/2023 0901   BILITOT 0.9 01/24/2023 0901   EGFR 55 (L) 01/24/2023 0901   GFRNONAA 55 (L) 05/07/2019 0834     Diabetic Labs (most recent): Lab Results  Component Value Date   HGBA1C 5.7 (H) 01/24/2023   HGBA1C 6.0 (H) 11/07/2022   HGBA1C 5.9 (H) 03/23/2022     Lipid Panel ( most recent) Lipid Panel     Component Value Date/Time   CHOL 244 (H) 06/04/2023 0930   TRIG 85 06/04/2023 0930   HDL 89 06/04/2023 0930   CHOLHDL 2.7 06/04/2023 0930   CHOLHDL 2.8 05/07/2019 0834   VLDL 12 08/11/2016 0818   LDLCALC 141 (H) 06/04/2023 0930   LDLCALC 115 (H) 05/07/2019 0834   LABVLDL 14 06/04/2023 0930      Lab Results  Component Value Date   TSH 2.750 06/04/2023   TSH 1.640 04/03/2023   TSH 2.500 11/07/2022   FREET4 1.01 06/04/2023   FREET4 1.33 04/03/2023     Bone density on Aug 16, 2021 [facility] Spine L1-L4 08/16/2021 73.8 Normal 2.3 1.456 g/cm2 13.2% Yes [facility] Spine L1-L4 06/13/2006 58.6 Normal 0.9 1.286 g/cm2 - -   DualFemur Total Left 08/16/2021 73.8 Normal -0.5 0.942 g/cm2 -4.9% Yes DualFemur Total Left 08/21/2018 70.8 Normal -0.1 0.991 g/cm2 1.7% -   DualFemur Total Mean 08/16/2021 73.8 Normal 0.0 1.004 g/cm2 -4.9% Yes DualFemur Total Mean 08/21/2018 70.8 - - 1.056 g/cm2 0.8% -    Left Forearm Radius 33% 08/16/2021 73.8 Normal -0.3 0.689 g/cm2 -3.1% -  ASSESSMENT: The BMD measured at Femur Total Left is 0.942 g/cm2 with a T-score of -0.5. This patient is considered normal according to  World Health Organization [facility]) criteria. The scan quality is good. Compared with the prior study on 08/21/2018, the BMD of the total mean shows  a statistically significant decrease.  Recent Results (from the past 2160 hours)  TSH     Status: None   Collection Time: 04/03/23 11:03 AM  Result Value Ref Range   TSH 1.640 0.450 - 4.500 uIU/mL  T4, free     Status: None   Collection Time: 04/03/23 11:03 AM  Result Value Ref Range   Free T4 1.33 0.82 - 1.77 ng/dL  Thyroid peroxidase antibody     Status: Abnormal   Collection Time: 04/03/23 11:03 AM  Result Value Ref Range   Thyroperoxidase Ab SerPl-aCnc 312 (H) 0 - 34 IU/mL  Thyroglobulin antibody     Status: None   Collection Time: 04/03/23 11:03 AM  Result Value Ref Range   Thyroglobulin Antibody <1.0 0.0 - 0.9 IU/mL    Comment: Thyroglobulin Antibody measured by [employer] Methodology It should be noted that the presence of thyroglobulin antibodies may not be pathogenic nor diagnostic, especially at very low levels. The assay manufacturer has found that four percent of individuals without evidence of thyroid disease or autoimmunity will have positive TgAb levels up to 4 IU/mL.   Magnesium     Status: None   Collection Time: 04/03/23 11:03 AM  Result Value Ref Range   Magnesium 2.0 1.6 - 2.3 mg/dL  Phosphorus     Status: None   Collection Time: 04/03/23 11:03 AM  Result Value Ref Range   Phosphorus 3.5 3.0 - 4.3 mg/dL  PTH, intact and calcium     Status: Abnormal   Collection Time: 04/03/23 11:03 AM  Result Value Ref Range   Calcium 10.6 (H) 8.7 - 10.3 mg/dL   PTH 21 15 - 65 pg/mL   PTH Interp Comment     Comment: Interpretation                 Intact PTH    Calcium                                 (pg/mL)      (mg/dL) Normal                          15 - 65     8.6 - 10.2 Primary Hyperparathyroidism         >65          >10.2 Secondary Hyperparathyroidism       >65          <10.2 Non-Parathyroid Hypercalcemia       <65          >10.2 Hypoparathyroidism                  <15          < 8.6 Non-Parathyroid Hypocalcemia    15 - 65          < 8.6   PTH-related  peptide     Status: None   Collection Time: 04/03/23 11:03 AM  Result Value Ref Range   PTH-related peptide <2.0 pmol/L    Comment: This test was developed and its performance characteristics determined by [facility]. It has not been cleared or approved by the Food and Drug Administration. Reference Range: All Ages: <2.0 The PTHrP assay should not be used to exclude  cancer or screen tumor patients for humoral hypercalcemia of malignancy (HHM). The results should always be assessed in conjunction with the patient's medical history, clinical examination, and other findings. If test results are clinically discordant, please contact the laboratory.   PTH, intact and calcium     Status: Abnormal   Collection Time: 06/04/23  9:30 AM  Result Value Ref Range   Calcium 10.5 (H) 8.7 - 10.3 mg/dL   PTH 36 15 - 65 pg/mL   PTH Interp Comment     Comment: Interpretation                 Intact PTH    Calcium                                 (pg/mL)      (mg/dL) Normal                          15 - 65     8.6 - 10.2 Primary Hyperparathyroidism         >65          >10.2 Secondary Hyperparathyroidism       >65          <10.2 Non-Parathyroid Hypercalcemia       <65          >10.2 Hypoparathyroidism                  <15          < 8.6 Non-Parathyroid Hypocalcemia    15 - 65          < 8.6   TSH     Status: None   Collection Time: 06/04/23  9:30 AM  Result Value Ref Range   TSH 2.750 0.450 - 4.500 uIU/mL  T4, free     Status: None   Collection Time: 06/04/23  9:30 AM  Result Value Ref Range   Free T4 1.01 0.82 - 1.77 ng/dL  Lipid panel     Status: Abnormal   Collection Time: 06/04/23  9:30 AM  Result Value Ref Range   Cholesterol, Total 244 (H) 100 - 199 mg/dL   Triglycerides 85 0 - 149 mg/dL   HDL 89 >78 mg/dL   VLDL Cholesterol Cal 14 5 - 40 mg/dL   LDL Chol Calc (NIH) 295 (H) 0 - 99 mg/dL   Chol/HDL Ratio 2.7 0.0 - 4.4 ratio    Comment:                                   T. Chol/HDL Ratio                                              Men  Women                               1/2 Avg.Risk  3.4    3.3                                   Avg.Risk  5.0  4.4                                2X Avg.Risk  9.6    7.1                                3X Avg.Risk 23.4   11.0   Calcium, urine, 24 hour     Status: None   Collection Time: 06/13/23  9:00 AM  Result Value Ref Range   Calcium, Urine 1.0 Not Estab. mg/dL   Calcium, 86V Urine 14 0 - 320 mg/24 hr  Creatinine, urine, 24 hour     Status: Abnormal   Collection Time: 06/13/23  9:00 AM  Result Value Ref Range   Creatinine, Urine 36.5 Not Estab. mg/dL   Creatinine, 78I Ur 696 (L) 800 - 1,800 mg/24 hr     Assessment & Plan:   1. Hypothyroidism, unspecified type  2. Hypercalcemia  3.  Prediabetes   - I have reviewed her new and available  records and clinically evaluated the patient. - Based on these reviews, she has longstanding hypothyroidism on thyroid hormone supplement, for which she is on regular thyroid hormone supplement.  She is advised to continue Unithroid 50 mcg p.o. daily before breakfast.     - We discussed about the correct intake of her thyroid hormone, on empty stomach at fasting, with water, separated by at least 30 minutes from breakfast and other medications,  and separated by more than 4 hours from calcium, iron, multivitamins, acid reflux medications (PPIs). -Patient is made aware of the fact that thyroid hormone replacement is needed for life, dose to be adjusted by periodic monitoring of thyroid function tests.   -Regarding her moderate hypercalcemia ; she remains off of calcium supplements.  Her previsit labs show mild hypercalcemia of 10.6 associated with inappropriately normal PTH of 36.    Her 24-hour urine calcium was only 14 mg / 24 hours.  She is not a surgical candidate for now. FHH is not ruled out.  She will be continued on expectant management.  She will return in 6 months with repeat PTH/calcium,  vitamin D measurements.  - she  will not need thyroid imaging at this time. Regarding her prediabetes: - she acknowledges that there is a room for improvement in her food and drink choices. - Suggestion is made for her to avoid simple carbohydrates  from her diet including Cakes, Sweet Desserts, Ice Cream, Soda (diet and regular), Sweet Tea, Candies, Chips, Cookies, Store Bought Juices, Alcohol , Artificial Sweeteners,  Coffee Creamer, and "Sugar-free" Products, Lemonade. This will help patient to have more stable blood glucose profile and potentially avoid unintended weight gain.  She is advised to continue pravastatin 80 mg p.o. nightly, as well as triamterene-hydrochlorothiazide 37.5-25 mg p.o. daily with breakfast for hypertension.  -Patient seems to have mild to moderate cognitive deficit, may benefit from social work assessment.  she is advised to maintain close follow up with Christina Perches, MD for primary care needs.  -  This note was partially dictated with voice recognition software. Similar sounding words can be transcribed inadequately or may not  be corrected upon review.   Follow up plan: Return in about 6 months (around 12/25/2023) for F/U with Pre-visit Labs, A1c -NV.   Marquis Lunch, MD [facility] [address]  [address] [place], [place] [address] Phone: [phone]  Fax: [phone]     06/25/2023, 3:37 PM  This note was partially dictated with voice recognition software. Similar sounding words can be transcribed inadequately or may not  be corrected upon review.

## 2023-07-20 ENCOUNTER — Telehealth: Payer: Self-pay | Admitting: Family Medicine

## 2023-07-20 NOTE — Telephone Encounter (Signed)
 Copied from CRM [id number]. Topic: General - Other >> Jul 20, 2023  1:35 PM Christina Carroll S wrote: Reason for CRM: Patient son Christina Carroll @ [phone] called and asked that Dr Rodolph Clap call him back regarding his mother- would not give any additional information, stated would just rather speak with the Doctor.

## 2023-07-20 NOTE — Telephone Encounter (Signed)
 Will give to Dr. Rodolph Clap to see what is needed

## 2023-07-20 NOTE — Telephone Encounter (Signed)
 Spoke to pt's son, states him and his wife have concerns about severe decline in patients mental state. States she has be showing issues with memory loss and depression. Pt would like to talk to you about what you advise? If they should come in for a visit with you or if can be discussed on a call? Please advise

## 2023-07-20 NOTE — Telephone Encounter (Signed)
 Spoke to patient received [facility] medical clearance form to have total right hip arthroplasty ASAP.  Appointment scheduled with DR Rodolph Clap for her physical 06.12.2025.  Patient would like to get in sooner if needed for medical clearance for her hip surgery.  Please advise if patient can be worked into schedule sooner with Dr Rodolph Clap. Call patient at [phone]. Forms were put in provider box.   Medical Clearance   Noted Copied Sleeved ( fax to [facility] at [phone].

## 2023-07-20 NOTE — Telephone Encounter (Signed)
 Tried calling to get more information. N/A. Lvm to cb

## 2023-07-23 ENCOUNTER — Telehealth: Payer: Self-pay

## 2023-07-23 ENCOUNTER — Ambulatory Visit

## 2023-07-23 NOTE — Telephone Encounter (Signed)
 Copied from CRM [id number]. Topic: General - Other >> Jul 23, 2023 10:51 AM Hassie Lint wrote: Reason for CRM: Patients son Carren Civatte returning a call from [place].  Carren Civatte can be reached at [phone]

## 2023-07-23 NOTE — Progress Notes (Signed)
 Patient is in office today for a nurse visit for  weight check . Patient weight was 139 lbs, has concerns about weight loss. States she will f/u with provider this week for an office visit.

## 2023-07-23 NOTE — Telephone Encounter (Signed)
 Lvm for son to cb

## 2023-07-23 NOTE — Telephone Encounter (Signed)
 Pt son informed.

## 2023-07-24 LAB — LIPID PANEL
Chol/HDL Ratio: 2.7 ratio (ref 0.0–4.4)
Cholesterol, Total: 224 mg/dL — ABNORMAL HIGH (ref 100–199)
HDL: 83 mg/dL (ref >39)
LDL Chol Calc (NIH): 129 mg/dL — ABNORMAL HIGH (ref 0–99)
Triglycerides: 68 mg/dL (ref 0–149)
VLDL Cholesterol Cal: 12 mg/dL (ref 5–40)

## 2023-07-24 LAB — VITAMIN D 25 HYDROXY (VIT D DEFICIENCY, FRACTURES): Vit D, 25-Hydroxy: 38.9 ng/mL (ref 30.0–100.0)

## 2023-07-24 LAB — CMP14+EGFR
ALT: 22 IU/L (ref 0–32)
AST: 21 IU/L (ref 0–40)
Albumin: 4.3 g/dL (ref 3.8–4.8)
Alkaline Phosphatase: 101 IU/L (ref 44–121)
BUN/Creatinine Ratio: 18 (ref 12–28)
BUN: 19 mg/dL (ref 8–27)
Bilirubin Total: 1 mg/dL (ref 0.0–1.2)
CO2: 27 mmol/L (ref 20–29)
Calcium: 10.4 mg/dL — ABNORMAL HIGH (ref 8.7–10.3)
Chloride: 94 mmol/L — ABNORMAL LOW (ref 96–106)
Creatinine, Ser: 1.08 mg/dL — ABNORMAL HIGH (ref 0.57–1.00)
Globulin, Total: 2.8 g/dL (ref 1.5–4.5)
Glucose: 95 mg/dL (ref 70–99)
Potassium: 4.3 mmol/L (ref 3.5–5.2)
Sodium: 138 mmol/L (ref 134–144)
Total Protein: 7.1 g/dL (ref 6.0–8.5)
eGFR: 54 mL/min/{1.73_m2} — ABNORMAL LOW (ref >59)

## 2023-07-24 LAB — HEMOGLOBIN A1C
Est. average glucose Bld gHb Est-mCnc: 120 mg/dL
Hgb A1c MFr Bld: 5.8 % — ABNORMAL HIGH (ref 4.8–5.6)

## 2023-07-26 ENCOUNTER — Encounter: Payer: Self-pay | Admitting: Family Medicine

## 2023-07-26 ENCOUNTER — Ambulatory Visit (INDEPENDENT_AMBULATORY_CARE_PROVIDER_SITE_OTHER): Admitting: Family Medicine

## 2023-07-26 VITALS — BP 125/73 | HR 71 | Resp 18 | Ht 65.0 in | Wt 138.0 lb

## 2023-07-26 DIAGNOSIS — E039 Hypothyroidism, unspecified: Secondary | ICD-10-CM

## 2023-07-26 DIAGNOSIS — R410 Disorientation, unspecified: Secondary | ICD-10-CM | POA: Diagnosis not present

## 2023-07-26 DIAGNOSIS — E785 Hyperlipidemia, unspecified: Secondary | ICD-10-CM

## 2023-07-26 DIAGNOSIS — F418 Other specified anxiety disorders: Secondary | ICD-10-CM

## 2023-07-26 DIAGNOSIS — R413 Other amnesia: Secondary | ICD-10-CM

## 2023-07-26 DIAGNOSIS — R7303 Prediabetes: Secondary | ICD-10-CM

## 2023-07-26 NOTE — Patient Instructions (Signed)
 F/U in 6 weeks, call if you need me sooer  MRI Brain scan ,  and caroticd  doppler, as well as referral to neurology and neuropsychiatery will be done on urrgent   basis  Thanks for choosing [facility], we consider it a privelige to serve you.

## 2023-08-07 ENCOUNTER — Encounter: Payer: Self-pay | Admitting: Family Medicine

## 2023-08-07 DIAGNOSIS — R413 Other amnesia: Secondary | ICD-10-CM | POA: Insufficient documentation

## 2023-08-07 DIAGNOSIS — R410 Disorientation, unspecified: Secondary | ICD-10-CM | POA: Insufficient documentation

## 2023-08-07 NOTE — Assessment & Plan Note (Addendum)
 Noted in past 4 months, MMSE in office normal, reported history by on is concerning, brain scan, and neurology eval

## 2023-08-07 NOTE — Assessment & Plan Note (Addendum)
 Negative depression screen and mild anxiety at visit, has had therapy which she found beneficial,ongoing financial strain and lives alone

## 2023-08-07 NOTE — Assessment & Plan Note (Signed)
 Patient educated about the importance of limiting  Carbohydrate intake , the need to commit to daily physical activity for a minimum of 30 minutes , and to commit weight loss. The fact that changes in all these areas will reduce or eliminate all together the development of diabetes is stressed.      Latest Ref Rng & Units 07/23/2023    9:08 AM 06/04/2023    9:30 AM 01/24/2023    9:01 AM 11/07/2022    4:46 PM 03/23/2022    8:49 AM  Diabetic Labs  HbA1c 4.8 - 5.6 % 5.8   5.7  6.0  5.9   Chol 100 - 199 mg/dL 045  409  811  914  782   HDL >39 mg/dL 83  89  86  95  86   Calc LDL 0 - 99 mg/dL 956  213  086  578  469   Triglycerides 0 - 149 mg/dL 68  85  79  84  61   Creatinine 0.57 - 1.00 mg/dL 6.29   5.28  4.13  2.44       07/26/2023    4:38 PM 07/23/2023    8:10 AM 06/25/2023    9:38 AM 06/04/2023    8:38 AM 05/02/2023    2:17 PM 02/28/2023    8:32 AM 01/12/2023    2:38 PM  BP/Weight  Systolic BP 125  010 120 128 114 124  Diastolic BP 73  82 74 73 76 82  Wt. (Lbs) 138.04 139 144.4 146 151.12 152.4   BMI 22.97 kg/m2 23.13 kg/m2 25.58 kg/m2 25.86 kg/m2 26.77 kg/m2 27 kg/m2        No data to display          Deteriorated needs to lower/ control carbs and sweets

## 2023-08-07 NOTE — Assessment & Plan Note (Addendum)
 Hyperlipidemia:Low fat diet discussed and encouraged. Uncontrolled , lower fat intake and change to crestor, will need to speak with pt Lipid Panel  Lab Results  Component Value Date   CHOL 224 (H) 07/23/2023   HDL 83 07/23/2023   LDLCALC 129 (H) 07/23/2023   TRIG 68 07/23/2023   CHOLHDL 2.7 07/23/2023     Not at goal needs to lower fat intake and inc statin dose

## 2023-08-07 NOTE — Progress Notes (Signed)
 Christina Carroll     MRN: [id number]      DOB: 05-03-1945  Chief Complaint  Patient presents with   surgical clearance     Surgical clearance for hip replacement in July    Altered Mental Status    Pt son also here to discuss confusion and altered mental status     HPI Christina Carroll is here with above concerns. Son in from out of state concerned that pt is demonstrating increased confusion and forgetfulness over last several months. He is aware of significant financial strain , stress and pressure over the years which he has tried to help with ,but there is  definite reason for concern Most notably, he had a birthday in the past month, that she celebrated with him travelling out of state to be with him, and he reports she recently asked about his birthday and had no recall of celebrating it with him She has recently been seeing therapist with good response Has upcoming hip replacement and needs to be medically cleared will get another appointment for that as this situation is more urgent  ROS See HPI  Denies recent fever or chills. Denies sinus pressure, nasal congestion, ear pain or sore throat. Denies chest congestion, productive cough or wheezing. Denies chest pains, palpitations and leg swelling Denies abdominal pain, nausea, vomiting,diarrhea or constipation.   Denies dysuria, frequency, hesitancy or incontinence. C/o hip pain  and limitation in mobility. Denies headaches, seizures, numbness, or tingling.  Denies skin break down or rash.   PE  BP 125/73   Pulse 71   Resp 18   Ht 5\' 5"  (1.651 m)   Wt 138 lb 0.6 oz (62.6 kg)   SpO2 97%   BMI 22.97 kg/m   Patient alert and oriented and in no cardiopulmonary distress.  HEENT: No facial asymmetry, EOMI,     Neck supple .  Chest: Clear to auscultation bilaterally.  CVS: S1, S2 no murmurs, no S3.Regular rate.  ABD: Soft non tender.   Ext: No edema  MS: Adequate ROM spine, shoulders, reduced in hip Skin:  Intact, no ulcerations or rash noted.  Psych: Good eye contact, normal affect.  anxious and mildly  depressed appearing.at times poor eye contact  CNS: CN 2-12 intact, power,  normal throughout.no focal deficits noted.   Assessment & Plan  Impaired memory Noted in past 4 months, MMSE in office normal, reported history by on is concerning, brain scan, and neurology eval  Depression with anxiety Negative depression screen and mild anxiety at visit, has had therapy which she found beneficial,ongoing financial strain and lives alone  Intermittent confusion Noted in past approx 2 to 3 months, brain scan and carotid doppler and neurology eval  Hypercalcemia Persitent followed by Endo  Prediabetes Patient educated about the importance of limiting  Carbohydrate intake , the need to commit to daily physical activity for a minimum of 30 minutes , and to commit weight loss. The fact that changes in all these areas will reduce or eliminate all together the development of diabetes is stressed.      Latest Ref Rng & Units 07/23/2023    9:08 AM 06/04/2023    9:30 AM 01/24/2023    9:01 AM 11/07/2022    4:46 PM 03/23/2022    8:49 AM  Diabetic Labs  HbA1c 4.8 - 5.6 % 5.8   5.7  6.0  5.9   Chol 100 - 199 mg/dL 841  324  401  027  253  HDL >39 mg/dL 83  89  86  95  86   Calc LDL 0 - 99 mg/dL 981  191  478  295  621   Triglycerides 0 - 149 mg/dL 68  85  79  84  61   Creatinine 0.57 - 1.00 mg/dL 3.08   6.57  8.46  9.62       07/26/2023    4:38 PM 07/23/2023    8:10 AM 06/25/2023    9:38 AM 06/04/2023    8:38 AM 05/02/2023    2:17 PM 02/28/2023    8:32 AM 01/12/2023    2:38 PM  BP/Weight  Systolic BP 125  952 120 128 114 124  Diastolic BP 73  82 74 73 76 82  Wt. (Lbs) 138.04 139 144.4 146 151.12 152.4   BMI 22.97 kg/m2 23.13 kg/m2 25.58 kg/m2 25.86 kg/m2 26.77 kg/m2 27 kg/m2        No data to display          Deteriorated needs to lower/ control carbs and  sweets  Hyperlipidemia Hyperlipidemia:Low fat diet discussed and encouraged. Uncontrolled , lower fat intake and change to crestor, will need to speak with pt Lipid Panel  Lab Results  Component Value Date   CHOL 224 (H) 07/23/2023   HDL 83 07/23/2023   LDLCALC 129 (H) 07/23/2023   TRIG 68 07/23/2023   CHOLHDL 2.7 07/23/2023     Not at goal needs to lower fat intake and inc statin dose  Hypothyroid Managed by endo and controlled on current med

## 2023-08-07 NOTE — Assessment & Plan Note (Signed)
 Persitent followed by Endo

## 2023-08-07 NOTE — Telephone Encounter (Signed)
 First attempt, n/a, lvm to cb

## 2023-08-07 NOTE — Assessment & Plan Note (Addendum)
 Noted in past approx 2 to 3 months, brain scan and carotid doppler and neurology eval

## 2023-08-07 NOTE — Telephone Encounter (Signed)
 Pls review recent results with pt, let her know blood sugar has increased slightly so reduce sweets and starches, still prediabetic, cholesterol above goal , needs to lower fat intake and I recommend switching to crestor 20 mg from pravachol  80 mg let me know if she agrees, liver function is good, thyroid  controlled

## 2023-08-07 NOTE — Assessment & Plan Note (Signed)
 Managed by endo and controlled on current med

## 2023-08-08 ENCOUNTER — Telehealth: Payer: Self-pay

## 2023-08-08 NOTE — Telephone Encounter (Signed)
 Called pt, called dropped. Tried calling back x2, call will not go through

## 2023-08-08 NOTE — Telephone Encounter (Signed)
 Copied from CRM [id number]. Topic: Clinical - Lab/Test Results >> Aug 08, 2023  2:25 PM Abigail D wrote: Reason for CRM: Patient returned call, called CAL 2x and patient hung up in the process

## 2023-08-09 NOTE — Telephone Encounter (Signed)
 Lvm to cb

## 2023-08-15 NOTE — Telephone Encounter (Signed)
Pt informed

## 2023-08-21 ENCOUNTER — Other Ambulatory Visit: Payer: Self-pay

## 2023-08-21 ENCOUNTER — Telehealth: Payer: Self-pay

## 2023-08-21 DIAGNOSIS — R413 Other amnesia: Secondary | ICD-10-CM

## 2023-08-21 NOTE — Telephone Encounter (Signed)
 Copied from CRM [id number]. Topic: Clinical - Request for Lab/Test Order >> Aug 21, 2023 10:04 AM Christina Carroll wrote: Reason for CRM: Pt inquiring if she needs to come in for labs before physical scheduled next week 08/30/2023. If so, pt requesting orders be put in and a call so that she may come in to have labs done.   Please assist pt further

## 2023-08-21 NOTE — Telephone Encounter (Signed)
 Labs ordered. Lvm to cb

## 2023-08-23 NOTE — Telephone Encounter (Signed)
Pt informed

## 2023-08-27 ENCOUNTER — Ambulatory Visit: Payer: Self-pay

## 2023-08-27 NOTE — Telephone Encounter (Signed)
 FYI Only or Action Required?: FYI only for provider  Patient was last seen in primary care on 07/26/2023 by Towanda Fret, MD. Called Nurse Triage reporting questions about medication.  Triage Disposition: Information or Advice Only Call     Copied from CRM [id number]. Topic: Clinical - Medication Question >> Aug 27, 2023  4:31 PM Stanly Early wrote: Reason for CRM: patient has a few question about her medication, [phone]     Reason for Disposition  Health Information question, no triage required and triager able to answer question  Answer Assessment - Initial Assessment Questions 1. REASON FOR CALL or QUESTION: "What is your reason for calling today?" or "How can I best help you?" or "What question do you have that I can help answer?"     Patient is asking which medication she was supposed to stop taking after she ran out. This RN referred to notes from telephone encounter on 07/23/23 stating Pravastatin  to continue until patient runs out and then switch to Crestor. Patient states she has a few days left. Patient has appt upcoming on Thursday and will address this at the appt with PCP.  Protocols used: Information Only Call - No Triage-A-AH

## 2023-08-28 ENCOUNTER — Ambulatory Visit (HOSPITAL_COMMUNITY)
Admission: RE | Admit: 2023-08-28 | Discharge: 2023-08-28 | Disposition: A | Discharge status: Home / Self Care | Source: Ambulatory Visit | Attending: Family Medicine | Admitting: Family Medicine

## 2023-08-28 DIAGNOSIS — R413 Other amnesia: Secondary | ICD-10-CM

## 2023-08-28 DIAGNOSIS — R410 Disorientation, unspecified: Secondary | ICD-10-CM | POA: Insufficient documentation

## 2023-08-28 LAB — CBC WITH DIFFERENTIAL/PLATELET
Basophils Absolute: 0 10*3/uL (ref 0.0–0.2)
Basos: 1 %
EOS (ABSOLUTE): 0 10*3/uL (ref 0.0–0.4)
Eos: 1 %
Hematocrit: 41.3 % (ref 34.0–46.6)
Hemoglobin: 13.7 g/dL (ref 11.1–15.9)
Immature Grans (Abs): 0 10*3/uL (ref 0.0–0.1)
Immature Granulocytes: 0 %
Lymphocytes Absolute: 2.2 10*3/uL (ref 0.7–3.1)
Lymphs: 37 %
MCH: 27.5 pg (ref 26.6–33.0)
MCHC: 33.2 g/dL (ref 31.5–35.7)
MCV: 83 fL (ref 79–97)
Monocytes Absolute: 0.7 10*3/uL (ref 0.1–0.9)
Monocytes: 12 %
Neutrophils Absolute: 3 10*3/uL (ref 1.4–7.0)
Neutrophils: 48 %
Platelets: 378 10*3/uL (ref 150–450)
RBC: 4.98 x10E6/uL (ref 3.77–5.28)
RDW: 14 % (ref 11.7–15.4)
WBC: 5.9 10*3/uL (ref 3.4–10.8)

## 2023-08-28 LAB — IRON: Iron: 69 ug/dL (ref 27–139)

## 2023-08-28 LAB — FERRITIN: Ferritin: 270 ng/mL — ABNORMAL HIGH (ref 15–150)

## 2023-08-28 LAB — RPR: RPR Ser Ql: NONREACTIVE

## 2023-08-28 LAB — VITAMIN B12: Vitamin B-12: 627 pg/mL (ref 232–1245)

## 2023-08-30 ENCOUNTER — Ambulatory Visit (INDEPENDENT_AMBULATORY_CARE_PROVIDER_SITE_OTHER): Payer: Medicare Other | Admitting: Family Medicine

## 2023-08-30 ENCOUNTER — Encounter: Payer: Self-pay | Admitting: Family Medicine

## 2023-08-30 VITALS — BP 132/75 | HR 66 | Resp 16 | Ht 65.0 in | Wt 136.1 lb

## 2023-08-30 DIAGNOSIS — Z01811 Encounter for preprocedural respiratory examination: Secondary | ICD-10-CM

## 2023-08-30 DIAGNOSIS — E785 Hyperlipidemia, unspecified: Secondary | ICD-10-CM | POA: Diagnosis not present

## 2023-08-30 DIAGNOSIS — Z0001 Encounter for general adult medical examination with abnormal findings: Secondary | ICD-10-CM

## 2023-08-30 DIAGNOSIS — I10 Essential (primary) hypertension: Secondary | ICD-10-CM

## 2023-08-30 DIAGNOSIS — Z01818 Encounter for other preprocedural examination: Secondary | ICD-10-CM | POA: Insufficient documentation

## 2023-08-30 DIAGNOSIS — Z1231 Encounter for screening mammogram for malignant neoplasm of breast: Secondary | ICD-10-CM

## 2023-08-30 MED ORDER — ROSUVASTATIN CALCIUM 20 MG PO TABS: 20.0000 mg | ORAL_TABLET | Freq: Every day | ORAL | 3 refills | Status: DC

## 2023-08-30 NOTE — Assessment & Plan Note (Signed)
 Annual exam as documented. . Immunization and cancer screening needs are specifically addressed at this visit.

## 2023-08-30 NOTE — Assessment & Plan Note (Addendum)
 History reviewed, only significant complaint is right hip pain eKg; sinus brady, no ischemia or lVH CXR and CCUA pendine  Current labs excellent

## 2023-08-30 NOTE — Progress Notes (Signed)
    Christina Carroll     MRN: [id number]      DOB: Jan 01, 1946  Chief Complaint  Patient presents with   Annual Exam    Cpe     HPI: Patient is in for annual physical exam.  Recent labs,  and radiology tests are reviewed. Pre op examination for upcoming right hip replacement is also done at this visit Immunization is reviewed , and  updated if needed.   PE: BP 132/75   Pulse 66   Resp 16   Ht 5' 5 (1.651 m)   Wt 136 lb 1.3 oz (61.7 kg)   SpO2 98%   BMI 22.64 kg/m   Pleasant  female, alert and oriented x 3, in no cardio-pulmonary distress. Afebrile. HEENT No facial trauma or asymetry. Sinuses non tender.  Extra occullar muscles intact.. External ears normal, . Neck: supple, no adenopathy,JVD or thyromegaly.No bruits.  Chest: Clear to ascultation bilaterally.No crackles or wheezes. Non tender to palpation   Cardiovascular system; Heart sounds normal,  S1 and  S2 ,no S3.  No murmur, or thrill. Apical beat not displaced Peripheral pulses normal.  Abdomen: Soft, non tender, no organomegaly or masses. No bruits. Bowel sounds normal. No guarding, tenderness or rebound.     Musculoskeletal exam:Decreased   ROM of spine,and right  hip , normal in shoulders and knees. No deformity ,swelling or crepitus noted. No muscle wasting or atrophy.   Neurologic: Cranial nerves 2 to 12 intact. Power, tone ,sensation  normal throughout.  disturbance in gait. No tremor.  Skin: Intact, no ulceration, erythema , scaling or rash noted. Pigmentation normal throughout  Psych; Normal mood and affect. Judgement and concentration normal   Assessment & Plan:  Encounter for Medicare annual examination with abnormal findings Annual exam as documented. . Immunization and cancer screening needs are specifically addressed at this visit.   Preop examination History reviewed, only significant complaint is right hip pain eKg; sinus brady, no ischemia or lVH CXR and CCUA  pendine  Current labs excellent

## 2023-08-30 NOTE — Patient Instructions (Addendum)
 F/U in 3 monhts, call if you need me ooner  New for cholesterol is rosuvastatin 20 mg daily, stop pravastatin  once you get this  CXR today  CCUA with reflex c/s  to be returned today or tomorrow as soon as possible  EKg today  Please schedule mammogram at checkout  Fasting lipid, cmp and eGFR 3 to 5 days before next visit  All the best with right hip replacement, and HAPPY BIRTHDAY!!   Thanks for choosing [facility], we consider it a privelige to serve you.

## 2023-09-20 ENCOUNTER — Other Ambulatory Visit: Payer: Self-pay

## 2023-09-20 ENCOUNTER — Telehealth: Payer: Self-pay | Admitting: Family Medicine

## 2023-09-20 MED ORDER — OYSTER SHELL CALCIUM/D3 500-5 MG-MCG PO TABS: ORAL_TABLET | ORAL | 1 refills | Status: DC

## 2023-09-20 NOTE — Telephone Encounter (Signed)
 Called patient lvm to call and schedule appt      Copied from CRM [id number]. Topic: Clinical - Medical Advice >> Sep 20, 2023  4:23 PM Winona R wrote: Pt would like to know if she needs a Pre- Op appointment before her surgery. She mentioned she forgot to ask while in the office today.

## 2023-09-25 ENCOUNTER — Ambulatory Visit: Admitting: Family Medicine

## 2023-09-26 ENCOUNTER — Ambulatory Visit (HOSPITAL_COMMUNITY)
Admission: RE | Admit: 2023-09-26 | Discharge: 2023-09-26 | Disposition: A | Discharge status: Home / Self Care | Source: Ambulatory Visit | Attending: Family Medicine | Admitting: Family Medicine

## 2023-09-26 DIAGNOSIS — Z01811 Encounter for preprocedural respiratory examination: Secondary | ICD-10-CM | POA: Diagnosis present

## 2023-09-27 ENCOUNTER — Ambulatory Visit: Payer: Self-pay | Admitting: Family Medicine

## 2023-09-27 NOTE — Patient Instructions (Addendum)
 SURGICAL WAITING ROOM VISITATION  Patients having surgery or a procedure may have no more than 2 support people in the waiting area - these visitors may rotate.    Children under the age of 39 must have an adult with them who is not the patient.  Visitors with respiratory illnesses are discouraged from visiting and should remain at home.  If the patient needs to stay at the hospital during part of their recovery, the visitor guidelines for inpatient rooms apply. Pre-op nurse will coordinate an appropriate time for 1 support person to accompany patient in pre-op.  This support person may not rotate.    Please refer to the [facility] website for the visitor guidelines for Inpatients (after your surgery is over and you are in a regular room).       Your procedure is scheduled on: 10/09/23   Report to [facility] Main Entrance    Report to admitting at 6:10 AM   Call this number if you have problems the morning of surgery [phone]   Do not eat food :After Midnight.   After Midnight you may have the following liquids until 5:40 AM DAY OF SURGERY  Water  Non-Citrus Juices (without pulp, NO RED-Apple, White grape, White cranberry) Black Coffee (NO MILK/CREAM OR CREAMERS, sugar ok)  Clear Tea (NO MILK/CREAM OR CREAMERS, sugar ok) regular and decaf                             Plain Jell-O (NO RED)                                           Fruit ices (not with fruit pulp, NO RED)                                     Popsicles (NO RED)                                                               Sports drinks like Gatorade (NO RED)                The day of surgery:  Drink ONE (1) Pre-Surgery G2 at 5:40 AM the morning of surgery. Drink in one sitting. Do not sip.  This drink was given to you during your hospital  pre-op appointment visit. Nothing else to drink after completing the  Pre-Surgery  G2.     Oral Hygiene is also important to reduce your risk of infection.                                     Remember - BRUSH YOUR TEETH THE MORNING OF SURGERY WITH YOUR REGULAR TOOTHPASTE   Stop all vitamins and herbal supplements 7 days before surgery.   Take these medicines the morning of surgery with A SIP OF WATER : Nifedipine (procardia ), Rosuvastatin , Unithroid   Do not take Maxzide(triamterene /hydrochlorothiazide ) the morning of surgery.             You may not have any metal on your body including hair pins, jewelry, and body piercing             Do not wear make-up, lotions, powders, perfumes/cologne, or deodorant  Do not wear nail polish including gel and S&S, artificial/acrylic nails, or any other type of covering on natural nails including finger and toenails. If you have artificial nails, gel coating, etc. that needs to be removed by a nail salon please have this removed prior to surgery or surgery may need to be canceled/ delayed if the surgeon/ anesthesia feels like they are unable to be safely monitored.   Do not shave  48 hours prior to surgery.    Do not bring valuables to the hospital. [facility] IS NOT             RESPONSIBLE   FOR VALUABLES.   Contacts, glasses, dentures or bridgework may not be worn into surgery.   Bring small overnight bag day of surgery.   DO NOT BRING YOUR HOME MEDICATIONS TO THE HOSPITAL. PHARMACY WILL DISPENSE MEDICATIONS LISTED ON YOUR MEDICATION LIST TO YOU DURING YOUR ADMISSION IN THE HOSPITAL!    Patients discharged on the day of surgery will not be allowed to drive home.  Someone NEEDS to stay with you for the first 24 hours after anesthesia.   Special Instructions: Bring a copy of your healthcare power of attorney and living will documents the day of surgery if you haven't scanned them before.              Please read over the following fact sheets you were given: IF YOU HAVE QUESTIONS ABOUT YOUR PRE-OP INSTRUCTIONS PLEASE CALL [phone] [place]   If you received a COVID test during  your pre-op visit  it is requested that you wear a mask when out in public, stay away from anyone that may not be feeling well and notify your surgeon if you develop symptoms. If you test positive for Covid or have been in contact with anyone that has tested positive in the last 10 days please notify you surgeon.      Pre-operative 5 CHG Bath Instructions   You can play a key role in reducing the risk of infection after surgery. Your skin needs to be as free of germs as possible. You can reduce the number of germs on your skin by washing with CHG (chlorhexidine  gluconate) soap before surgery. CHG is an antiseptic soap that kills germs and continues to kill germs even after washing.   DO NOT use if you have an allergy to chlorhexidine /CHG or antibacterial soaps. If your skin becomes reddened or irritated, stop using the CHG and notify one of our RNs at [phone].   Please shower with the CHG soap starting 4 days before surgery using the following schedule:     Please keep in mind the following:  DO NOT shave, including legs and underarms, starting the day of your first shower.   You may shave your face at any point before/day of surgery.  Place clean sheets on your bed the day you start using CHG soap. Use a clean washcloth (not used since being washed) for each shower. DO NOT sleep with pets once you start using the CHG.   CHG Shower Instructions:  If you choose to wash your hair and private area, wash first with your  normal shampoo/soap.  After you use shampoo/soap, rinse your hair and body thoroughly to remove shampoo/soap residue.  Turn the water  OFF and apply about 3 tablespoons (45 ml) of CHG soap to a CLEAN washcloth.  Apply CHG soap ONLY FROM YOUR NECK DOWN TO YOUR TOES (washing for 3-5 minutes)  DO NOT use CHG soap on face, private areas, open wounds, or sores.  Pay special attention to the area where your surgery is being performed.  If you are having back surgery, having  someone wash your back for you may be helpful. Wait 2 minutes after CHG soap is applied, then you may rinse off the CHG soap.  Pat dry with a clean towel  Put on clean clothes/pajamas   If you choose to wear lotion, please use ONLY the CHG-compatible lotions on the back of this paper.     Additional instructions for the day of surgery: DO NOT APPLY any lotions, deodorants, cologne, or perfumes.   Put on clean/comfortable clothes.  Brush your teeth.  Ask your nurse before applying any prescription medications to the skin.      CHG Compatible Lotions   Aveeno Moisturizing lotion  Cetaphil Moisturizing Cream  Cetaphil Moisturizing Lotion  Clairol Herbal Essence Moisturizing Lotion, Dry Skin  Clairol Herbal Essence Moisturizing Lotion, Extra Dry Skin  Clairol Herbal Essence Moisturizing Lotion, Normal Skin  Curel Age Defying Therapeutic Moisturizing Lotion with Alpha Hydroxy  Curel Extreme Care Body Lotion  Curel Soothing Hands Moisturizing Hand Lotion  Curel Therapeutic Moisturizing Cream, Fragrance-Free  Curel Therapeutic Moisturizing Lotion, Fragrance-Free  Curel Therapeutic Moisturizing Lotion, Original Formula  Eucerin Daily Replenishing Lotion  Eucerin Dry Skin Therapy Plus Alpha Hydroxy Crme  Eucerin Dry Skin Therapy Plus Alpha Hydroxy Lotion  Eucerin Original Crme  Eucerin Original Lotion  Eucerin Plus Crme Eucerin Plus Lotion  Eucerin TriLipid Replenishing Lotion  Keri Anti-Bacterial Hand Lotion  Keri Deep Conditioning Original Lotion Dry Skin Formula Softly Scented  Keri Deep Conditioning Original Lotion, Fragrance Free Sensitive Skin Formula  Keri Lotion Fast Absorbing Fragrance Free Sensitive Skin Formula  Keri Lotion Fast Absorbing Softly Scented Dry Skin Formula  Keri Original Lotion  Keri Skin Renewal Lotion Keri Silky Smooth Lotion  Keri Silky Smooth Sensitive Skin Lotion  Nivea Body Creamy Conditioning Oil  Nivea Body Extra Enriched Lotion  Nivea Body  Original Lotion  Nivea Body Sheer Moisturizing Lotion Nivea Crme  Nivea Skin Firming Lotion  NutraDerm 30 Skin Lotion  NutraDerm Skin Lotion  NutraDerm Therapeutic Skin Cream  NutraDerm Therapeutic Skin Lotion  ProShield Protective Hand Cream   Incentive Spirometer  An incentive spirometer is a tool that can help keep your lungs clear and active. This tool measures how well you are filling your lungs with each breath. Taking long deep breaths may help reverse or decrease the chance of developing breathing (pulmonary) problems (especially infection) following: A long period of time when you are unable to move or be active. BEFORE THE PROCEDURE  If the spirometer includes an indicator to show your best effort, your nurse or respiratory therapist will set it to a desired goal. If possible, sit up straight or lean slightly forward. Try not to slouch. Hold the incentive spirometer in an upright position. INSTRUCTIONS FOR USE  Sit on the edge of your bed if possible, or sit up as far as you can in bed or on a chair. Hold the incentive spirometer in an upright position. Breathe out normally. Place the mouthpiece in your mouth and seal  your lips tightly around it. Breathe in slowly and as deeply as possible, raising the piston or the ball toward the top of the column. Hold your breath for 3-5 seconds or for as long as possible. Allow the piston or ball to fall to the bottom of the column. Remove the mouthpiece from your mouth and breathe out normally. Rest for a few seconds and repeat Steps 1 through 7 at least 10 times every 1-2 hours when you are awake. Take your time and take a few normal breaths between deep breaths. The spirometer may include an indicator to show your best effort. Use the indicator as a goal to work toward during each repetition. After each set of 10 deep breaths, practice coughing to be sure your lungs are clear. If you have an incision (the cut made at the time of  surgery), support your incision when coughing by placing a pillow or rolled up towels firmly against it. Once you are able to get out of bed, walk around indoors and cough well. You may stop using the incentive spirometer when instructed by your caregiver.  RISKS AND COMPLICATIONS Take your time so you do not get dizzy or light-headed. If you are in pain, you may need to take or ask for pain medication before doing incentive spirometry. It is harder to take a deep breath if you are having pain. AFTER USE Rest and breathe slowly and easily. It can be helpful to keep track of a log of your progress. Your caregiver can provide you with a simple table to help with this. If you are using the spirometer at home, follow these instructions: SEEK MEDICAL CARE IF:  You are having difficultly using the spirometer. You have trouble using the spirometer as often as instructed. Your pain medication is not giving enough relief while using the spirometer. You develop fever of 100.5 F (38.1 C) or higher. SEEK IMMEDIATE MEDICAL CARE IF:  You cough up bloody sputum that had not been present before. You develop fever of 102 F (38.9 C) or greater. You develop worsening pain at or near the incision site. MAKE SURE YOU:  Understand these instructions. Will watch your condition. Will get help right away if you are not doing well or get worse. Document Released: 07/17/2006 Document Revised: 05/29/2011 Document Reviewed: 09/17/2006   WHAT IS A BLOOD TRANSFUSION? Blood Transfusion Information  A transfusion is the replacement of blood or some of its parts. Blood is made up of multiple cells which provide different functions. Red blood cells carry oxygen and are used for blood loss replacement. White blood cells fight against infection. Platelets control bleeding. Plasma helps clot blood. Other blood products are available for specialized needs, such as hemophilia or other clotting disorders. BEFORE THE  TRANSFUSION  Who gives blood for transfusions?  Healthy volunteers who are fully evaluated to make sure their blood is safe. This is blood bank blood. Transfusion therapy is the safest it has ever been in the practice of medicine. Before blood is taken from a donor, a complete history is taken to make sure that person has no history of diseases nor engages in risky social behavior (examples are intravenous drug use or sexual activity with multiple partners). The donor's travel history is screened to minimize risk of transmitting infections, such as malaria. The donated blood is tested for signs of infectious diseases, such as HIV and hepatitis. The blood is then tested to be sure it is compatible with you in order to minimize  the chance of a transfusion reaction. If you or a relative donates blood, this is often done in anticipation of surgery and is not appropriate for emergency situations. It takes many days to process the donated blood. RISKS AND COMPLICATIONS Although transfusion therapy is very safe and saves many lives, the main dangers of transfusion include:  Getting an infectious disease. Developing a transfusion reaction. This is an allergic reaction to something in the blood you were given. Every precaution is taken to prevent this. The decision to have a blood transfusion has been considered carefully by your caregiver before blood is given. Blood is not given unless the benefits outweigh the risks. AFTER THE TRANSFUSION Right after receiving a blood transfusion, you will usually feel much better and more energetic. This is especially true if your red blood cells have gotten low (anemic). The transfusion raises the level of the red blood cells which carry oxygen, and this usually causes an energy increase. The nurse administering the transfusion will monitor you carefully for complications. HOME CARE INSTRUCTIONS  No special instructions are needed after a transfusion. You may find your  energy is better. Speak with your caregiver about any limitations on activity for underlying diseases you may have. SEEK MEDICAL CARE IF:  Your condition is not improving after your transfusion. You develop redness or irritation at the intravenous (IV) site. SEEK IMMEDIATE MEDICAL CARE IF:  Any of the following symptoms occur over the next 12 hours: Shaking chills. You have a temperature by mouth above 102 F (38.9 C), not controlled by medicine. Chest, back, or muscle pain. People around you feel you are not acting correctly or are confused. Shortness of breath or difficulty breathing. Dizziness and fainting. You get a rash or develop hives. You have a decrease in urine output. Your urine turns a dark color or changes to pink, red, or brown. Any of the following symptoms occur over the next 10 days: You have a temperature by mouth above 102 F (38.9 C), not controlled by medicine. Shortness of breath. Weakness after normal activity. The white part of the eye turns yellow (jaundice). You have a decrease in the amount of urine or are urinating less often. Your urine turns a dark color or changes to pink, red, or brown. Document Released: 03/03/2000 Document Revised: 05/29/2011 Document Reviewed: 10/21/2007 [facility] Patient Information 2014 [place], [place].

## 2023-09-27 NOTE — Progress Notes (Addendum)
 COVID Vaccine received:  []  No [x]  Yes Date of any COVID positive Test in last 90 days: no PCP - Rollene Pesa MD Cardiologist - n/a  Chest x-ray - 09/27/23 Epic EKG -  08/30/23 Epic Stress Test - 02/23/14 Epic ECHO -  Cardiac Cath -   Bowel Prep - [x]  No  []   Yes ______  Pacemaker / ICD device [x]  No []  Yes   Spinal Cord Stimulator:[x]  No []  Yes       History of Sleep Apnea? [x]  No []  Yes   CPAP used?- [x]  No []  Yes    Does the patient monitor blood sugar?          [x]  No []  Yes  []  N/A  Patient has: []  NO Hx DM   [x]  Pre-DM                 []  DM1  []   DM2 Does patient have a [employer] or Dexacom? []  No []  Yes   Fasting Blood Sugar Ranges-  Checks Blood Sugar _____ times a day  GLP1 agonist / usual dose - no GLP1 instructions:  SGLT-2 inhibitors / usual dose - no SGLT-2 instructions:   Blood Thinner / Instructions:no Aspirin Instructions:ASA 81mg . Will stop on 10/04/23  Comments:   Activity level: Patient is able  to climb a flight of stairs without difficulty; [x]  No CP  [x]  No SOB,    Patient can  perform ADLs without assistance.   Anesthesia review:   Patient denies shortness of breath, fever, cough and chest pain at PAT appointment.  Patient verbalized understanding and agreement to the Pre-Surgical Instructions that were given to them at this PAT appointment. Patient was also educated of the need to review these PAT instructions again prior to his/her surgery.I reviewed the appropriate phone numbers to call if they have any and questions or concerns.

## 2023-09-28 ENCOUNTER — Other Ambulatory Visit: Payer: Self-pay

## 2023-09-28 ENCOUNTER — Encounter (HOSPITAL_COMMUNITY)
Admission: RE | Admit: 2023-09-28 | Discharge: 2023-09-28 | Disposition: A | Discharge status: Home / Self Care | Source: Ambulatory Visit | Attending: Orthopedic Surgery | Admitting: Orthopedic Surgery

## 2023-09-28 ENCOUNTER — Encounter (HOSPITAL_COMMUNITY): Payer: Self-pay

## 2023-09-28 VITALS — BP 134/76 | HR 66 | Temp 97.9°F | Resp 16 | Ht 65.0 in | Wt 138.0 lb

## 2023-09-28 DIAGNOSIS — I10 Essential (primary) hypertension: Secondary | ICD-10-CM | POA: Diagnosis not present

## 2023-09-28 DIAGNOSIS — M1611 Unilateral primary osteoarthritis, right hip: Secondary | ICD-10-CM | POA: Diagnosis not present

## 2023-09-28 DIAGNOSIS — Z01818 Encounter for other preprocedural examination: Secondary | ICD-10-CM

## 2023-09-28 DIAGNOSIS — Z01812 Encounter for preprocedural laboratory examination: Secondary | ICD-10-CM | POA: Insufficient documentation

## 2023-09-28 LAB — CBC
HCT: 40.3 % (ref 36.0–46.0)
Hemoglobin: 13.2 g/dL (ref 12.0–15.0)
MCH: 26.6 pg (ref 26.0–34.0)
MCHC: 32.8 g/dL (ref 30.0–36.0)
MCV: 81.1 fL (ref 80.0–100.0)
Platelets: 363 K/uL (ref 150–400)
RBC: 4.97 MIL/uL (ref 3.87–5.11)
RDW: 14.2 % (ref 11.5–15.5)
WBC: 5.9 K/uL (ref 4.0–10.5)
nRBC: 0 % (ref 0.0–0.2)

## 2023-09-28 LAB — UA/M W/RFLX CULTURE, ROUTINE
Bilirubin, UA: NEGATIVE
Glucose, UA: NEGATIVE
Ketones, UA: NEGATIVE
Nitrite, UA: NEGATIVE
Protein,UA: NEGATIVE
RBC, UA: NEGATIVE
Specific Gravity, UA: 1.007 (ref 1.005–1.030)
Urobilinogen, Ur: 0.2 mg/dL (ref 0.2–1.0)
pH, UA: 6 (ref 5.0–7.5)

## 2023-09-28 LAB — BASIC METABOLIC PANEL WITH GFR
Anion gap: 10 (ref 5–15)
BUN: 18 mg/dL (ref 8–23)
CO2: 28 mmol/L (ref 22–32)
Calcium: 9.9 mg/dL (ref 8.9–10.3)
Chloride: 99 mmol/L (ref 98–111)
Creatinine, Ser: 1.13 mg/dL — ABNORMAL HIGH (ref 0.44–1.00)
GFR, Estimated: 51 mL/min — ABNORMAL LOW (ref >60)
Glucose, Bld: 94 mg/dL (ref 70–99)
Potassium: 3.7 mmol/L (ref 3.5–5.1)
Sodium: 137 mmol/L (ref 135–145)

## 2023-09-28 LAB — TYPE AND SCREEN
ABO/RH(D): O POS
Antibody Screen: NEGATIVE

## 2023-09-28 LAB — MICROSCOPIC EXAMINATION
Bacteria, UA: NONE SEEN
Casts: NONE SEEN /LPF

## 2023-09-28 LAB — URINE CULTURE, REFLEX

## 2023-09-28 LAB — SURGICAL PCR SCREEN
MRSA, PCR: NEGATIVE
Staphylococcus aureus: NEGATIVE

## 2023-10-08 MED ORDER — LIDOCAINE HCL (PF) 2 % IJ SOLN
INTRAMUSCULAR | Status: AC
  Filled 2023-10-08: qty 10

## 2023-10-08 MED ORDER — ONDANSETRON HCL 4 MG/2ML IJ SOLN
INTRAMUSCULAR | Status: AC
  Filled 2023-10-08: qty 2

## 2023-10-08 MED ORDER — DEXAMETHASONE SODIUM PHOSPHATE 10 MG/ML IJ SOLN
INTRAMUSCULAR | Status: AC
  Filled 2023-10-08: qty 1

## 2023-10-08 NOTE — H&P (Signed)
 TOTAL HIP ADMISSION H&P  Patient is admitted for right total hip arthroplasty.  Therapy Plans: HEP Disposition: Home alone but will ask cousin vs son to stay Planned DVT Prophylaxis: aspirin  81mg  BID DME needed: walker PCP: Dr. Rollene Pesa -- clearance received TXA: IV Allergies: NKDA Anesthesia Concerns: none BMI: 24.7 Last HgbA1c: Not diabetic   Other: - tramadol  / oxycodone , tylenol , celebrex, methocarbamol  (prefers minimal pain meds) - Has a great son to help her - No hx of VTE or cancer  Subjective:  Chief Complaint: right hip pain  HPI: Christina Carroll, 78 y.o. female, has a history of pain and functional disability in the right hip(s) due to arthritis and patient has failed non-surgical conservative treatments for greater than 12 weeks to include NSAID's and/or analgesics and activity modification.  Onset of symptoms was gradual starting 2 years ago with gradually worsening course since that time.The patient noted no past surgery on the right hip(s).  Patient currently rates pain in the right hip at 8 out of 10 with activity. Patient has worsening of pain with activity and weight bearing, pain that interfers with activities of daily living, and pain with passive range of motion. Patient has evidence of joint space narrowing by imaging studies. This condition presents safety issues increasing the risk of falls.  There is no current active infection.  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/06/2023   Pain of right lower extremity 05/06/2023   Right hip pain 01/14/2023   Hypercalcemia 01/12/2023   Depression with anxiety 11/09/2022   Hypothyroid 11/09/2022   Poor appetite 11/09/2022   Weight loss, unintentional 11/09/2022   Encounter for Medicare annual examination with abnormal findings 07/28/2022    Vitamin D  deficiency 03/23/2022   Sciatica of right side 10/24/2019   Osteoarthritis of right hip 10/24/2019   Fall 09/24/2019   Hearing loss 03/19/2018   Special screening for malignant neoplasms, colon 08/17/2016   Prediabetes 02/22/2012   Restless leg 09/19/2011   Hyperlipidemia 08/30/2007   Overweight (BMI 25.0-29.9) 08/30/2007   Essential hypertension, benign 08/30/2007   Past Medical History:  Diagnosis Date   Arthritis    BACK PAIN 02/02/2010   Qualifier: Diagnosis of  By: Pesa MD, Margaret     Essential hypertension    Hyperlipidemia    Hypertension    Phreesia 07/05/2020   Hypothyroidism    Lymphocytic thyroiditis    Metabolic syndrome X 12/02/2013   NEVUS 05/12/2010   Qualifier: Diagnosis of  By: Pesa MD, Margaret     Obesity    Prediabetes    Primary osteoarthritis, 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   TENDINITIS 02/02/2010   Qualifier: Diagnosis of  By: Pesa MD, Margaret     Thyroid  disease    Phreesia 07/05/2020    Past Surgical History:  Procedure Laterality Date   Bilateral foot surgery bone removed  2000   COLONOSCOPY  11/30/2011   Procedure: COLONOSCOPY;  Surgeon: Claudis RAYMOND Rivet, MD;  Location: [facility];  Service: Endoscopy;  Laterality: N/A;  830   COLONOSCOPY WITH PROPOFOL  N/A 04/04/2022   Procedure: COLONOSCOPY WITH PROPOFOL ;  Surgeon: Eartha Angelia Sieving, MD;  Location: [facility];  Service: Gastroenterology;  Laterality: N/A;  1:45pm, asa 3   Lumpectomy removed from thoracic spine area  1999   POLYPECTOMY  04/04/2022   Procedure: POLYPECTOMY INTESTINAL;  Surgeon: Eartha Flavors, Toribio, MD;  Location: [facility];  Service: Gastroenterology;;    No current facility-administered medications for this encounter.   Current Outpatient Medications  Medication Sig Dispense Refill Last Dose/Taking   Ascorbic Acid (VITAMIN C PO) Take 1 tablet by mouth daily.   Taking   BLACK CURRANT SEED OIL PO Take 1 capsule by mouth daily.   Taking    Cholecalciferol (VITAMIN D ) 50 MCG (2000 UT) CAPS Take 1 capsule (2,000 Units total) by mouth daily. 100 capsule 3 Taking   CINNAMON PO Take 1 capsule by mouth daily.   Taking   LINIMENTS EX Apply 1 Application topically daily as needed (pain). Horse Liniment   Taking As Needed   NIFEdipine  (PROCARDIA  XL/NIFEDICAL XL) 60 MG 24 hr tablet TAKE 1 TABLET BY MOUTH DAILY 90 tablet 3 Taking   rosuvastatin  (CRESTOR ) 20 MG tablet Take 1 tablet (20 mg total) by mouth daily. 90 tablet 3 Taking   triamterene -hydrochlorothiazide  (MAXZIDE -25) 37.5-25 MG tablet TAKE 1 TABLET BY MOUTH DAILY 90 tablet 3 Taking   UNITHROID  50 MCG tablet Take 1 tablet (50 mcg total) by mouth daily before breakfast. 90 tablet 1 Taking   No Known Allergies  Social History   Tobacco Use   Smoking status: Never   Smokeless tobacco: Never  Substance Use Topics   Alcohol use: No    Alcohol/week: 0.0 standard drinks of alcohol    Family History  Problem Relation Age of Onset   Stroke Mother    Diabetes Mother    Hypertension Mother    Coronary artery disease Father    Arthritis Other    Cancer Other    Breast cancer Maternal Aunt    Hypertension Son      Review of Systems  Constitutional:  Negative for chills and fever.  Respiratory:  Negative for cough and shortness of breath.   Cardiovascular:  Negative for chest pain.  Gastrointestinal:  Negative for nausea and vomiting.  Musculoskeletal:  Positive for arthralgias.     Objective:  Physical Exam Well nourished and well developed. General: Alert and oriented x3, cooperative and pleasant, no acute distress.  Right hip exam: Range of motion of her right hip is significantly limited with hip flexion internal rotation just beyond neutral with pelvic tilting due to pain, external rotation at 20 degrees She has noted to have an external rotation contracture with active hip flexion No significant lower extremity edema, erythema or calf tenderness In comparison her  left hip range of motion is more fluid with some tightness but without significant groin pain with hip flexion internal rotation greater than 10 degrees with external rotation to 30 degrees  Vital signs in last 24 hours:    Labs:   Estimated body mass index is 22.96 kg/m as calculated from the following:   Height as of 09/28/23: 5' 5 (1.651 m).   Weight as of 09/28/23: 62.6 kg.   Imaging Review Plain radiographs demonstrate severe degenerative joint disease of the right hip(s). The bone quality appears to be adequate for age and reported activity level.      Assessment/Plan:  End stage arthritis, right hip(s)  The patient history, physical examination, clinical judgement of the provider and imaging studies are consistent with end stage degenerative joint disease of the right hip(s) and total hip arthroplasty is deemed medically necessary. The treatment options including medical management, injection therapy, arthroscopy and arthroplasty were discussed at length. The risks and benefits of total hip arthroplasty were presented  and reviewed. The risks due to aseptic loosening, infection, stiffness, dislocation/subluxation,  thromboembolic complications and other imponderables were discussed.  The patient acknowledged the explanation, agreed to proceed with the plan and consent was signed. Patient is being admitted for inpatient treatment for surgery, pain control, PT, OT, prophylactic antibiotics, VTE prophylaxis, progressive ambulation and ADL's and discharge planning.The patient is planning to be discharged home.   Rosina Calin, PA-C Orthopedic Surgery [facility] [place] [phone]

## 2023-10-08 NOTE — Anesthesia Preprocedure Evaluation (Signed)
 Anesthesia Evaluation  Patient identified by MRN, date of birth, ID band Patient awake    Reviewed: Allergy & Precautions, NPO status , Patient's Chart, lab work & pertinent test results  Airway Mallampati: II  TM Distance: >3 FB Neck ROM: Full    Dental no notable dental hx.    Pulmonary neg pulmonary ROS   Pulmonary exam normal        Cardiovascular hypertension, Pt. on medications  Rhythm:Regular Rate:Normal     Neuro/Psych   Anxiety Depression    negative neurological ROS     GI/Hepatic negative GI ROS, Neg liver ROS,,,  Endo/Other  Hypothyroidism    Renal/GU negative Renal ROS  negative genitourinary   Musculoskeletal  (+) Arthritis , Osteoarthritis,    Abdominal Normal abdominal exam  (+)   Peds  Hematology Lab Results      Component                Value               Date                      WBC                      5.9                 09/28/2023                HGB                      13.2                09/28/2023                HCT                      40.3                09/28/2023                MCV                      81.1                09/28/2023                PLT                      363                 09/28/2023              Anesthesia Other Findings   Reproductive/Obstetrics                              Anesthesia Physical Anesthesia Plan  ASA: 2  Anesthesia Plan: MAC and Spinal   Post-op Pain Management:    Induction: Intravenous  PONV Risk Score and Plan: 2 and Ondansetron , Dexamethasone , Treatment may vary due to age or medical condition and Propofol  infusion  Airway Management Planned: Simple Face Mask and Nasal Cannula  Additional Equipment: None  Intra-op Plan:   Post-operative Plan:   Informed Consent: I have reviewed the patients History and Physical, chart, labs and discussed the procedure including the risks, benefits and alternatives for  the proposed  anesthesia with the patient or authorized representative who has indicated his/her understanding and acceptance.     Dental advisory given  Plan Discussed with: CRNA  Anesthesia Plan Comments:          Anesthesia Quick Evaluation

## 2023-10-09 ENCOUNTER — Other Ambulatory Visit: Payer: Self-pay

## 2023-10-09 ENCOUNTER — Ambulatory Visit (HOSPITAL_COMMUNITY)

## 2023-10-09 ENCOUNTER — Ambulatory Visit (HOSPITAL_COMMUNITY): Admitting: Anesthesiology

## 2023-10-09 ENCOUNTER — Encounter (HOSPITAL_COMMUNITY): Payer: Self-pay | Admitting: Orthopedic Surgery

## 2023-10-09 ENCOUNTER — Observation Stay (HOSPITAL_COMMUNITY)

## 2023-10-09 ENCOUNTER — Encounter (HOSPITAL_COMMUNITY)
Admission: RE | Disposition: A | Discharge status: Home / Self Care | Payer: Self-pay | Source: Home / Self Care | Attending: Orthopedic Surgery

## 2023-10-09 ENCOUNTER — Observation Stay (HOSPITAL_COMMUNITY)
Admission: RE | Admit: 2023-10-09 | Discharge: 2023-10-11 | Disposition: A | Discharge status: Home / Self Care | Attending: Orthopedic Surgery | Admitting: Orthopedic Surgery

## 2023-10-09 ENCOUNTER — Encounter (HOSPITAL_COMMUNITY): Admitting: Anesthesiology

## 2023-10-09 DIAGNOSIS — E039 Hypothyroidism, unspecified: Secondary | ICD-10-CM | POA: Insufficient documentation

## 2023-10-09 DIAGNOSIS — Z79899 Other long term (current) drug therapy: Secondary | ICD-10-CM | POA: Diagnosis not present

## 2023-10-09 DIAGNOSIS — M1611 Unilateral primary osteoarthritis, right hip: Secondary | ICD-10-CM | POA: Diagnosis not present

## 2023-10-09 DIAGNOSIS — Z96641 Presence of right artificial hip joint: Secondary | ICD-10-CM

## 2023-10-09 DIAGNOSIS — I10 Essential (primary) hypertension: Secondary | ICD-10-CM | POA: Insufficient documentation

## 2023-10-09 HISTORY — PX: TOTAL HIP ARTHROPLASTY: SHX124

## 2023-10-09 LAB — ABO/RH: ABO/RH(D): O POS

## 2023-10-09 SURGERY — ARTHROPLASTY, HIP, TOTAL, ANTERIOR APPROACH
Anesthesia: Monitor Anesthesia Care | Site: Hip | Laterality: Right

## 2023-10-09 MED ORDER — MENTHOL 3 MG MT LOZG: 1.0000 | LOZENGE | OROMUCOSAL | Status: DC | PRN

## 2023-10-09 MED ORDER — MIDAZOLAM HCL 2 MG/2ML IJ SOLN
INTRAMUSCULAR | Status: AC
  Filled 2023-10-09: qty 2

## 2023-10-09 MED ORDER — ROSUVASTATIN CALCIUM 20 MG PO TABS
20.0000 mg | ORAL_TABLET | Freq: Every day | ORAL | Status: DC
  Administered 2023-10-09 – 2023-10-11 (×3): 20 mg via ORAL
  Filled 2023-10-09 (×3): qty 1

## 2023-10-09 MED ORDER — PHENYLEPHRINE 80 MCG/ML (10ML) SYRINGE FOR IV PUSH (FOR BLOOD PRESSURE SUPPORT)
PREFILLED_SYRINGE | INTRAVENOUS | Status: DC | PRN
Start: 2023-10-09 — End: 2023-10-09
  Administered 2023-10-09: 80 ug via INTRAVENOUS
  Administered 2023-10-09 (×2): 160 ug via INTRAVENOUS

## 2023-10-09 MED ORDER — LEVOTHYROXINE SODIUM 50 MCG PO TABS
50.0000 ug | ORAL_TABLET | Freq: Every day | ORAL | Status: DC
  Administered 2023-10-10 – 2023-10-11 (×2): 50 ug via ORAL
  Filled 2023-10-09 (×2): qty 1

## 2023-10-09 MED ORDER — DEXAMETHASONE SODIUM PHOSPHATE 10 MG/ML IJ SOLN
8.0000 mg | Freq: Once | INTRAMUSCULAR | Status: AC
  Administered 2023-10-09: 10 mg via INTRAVENOUS

## 2023-10-09 MED ORDER — PROPOFOL 1000 MG/100ML IV EMUL
INTRAVENOUS | Status: AC
  Filled 2023-10-09: qty 100

## 2023-10-09 MED ORDER — ONDANSETRON HCL 4 MG/2ML IJ SOLN
INTRAMUSCULAR | Status: DC | PRN
  Administered 2023-10-09: 4 mg via INTRAVENOUS

## 2023-10-09 MED ORDER — LACTATED RINGERS IV SOLN: INTRAVENOUS | Status: DC

## 2023-10-09 MED ORDER — POLYETHYLENE GLYCOL 3350 17 G PO PACK
17.0000 g | PACK | Freq: Two times a day (BID) | ORAL | Status: DC
  Administered 2023-10-09 – 2023-10-11 (×4): 17 g via ORAL
  Filled 2023-10-09 (×5): qty 1

## 2023-10-09 MED ORDER — EPHEDRINE SULFATE-NACL 50-0.9 MG/10ML-% IV SOSY
PREFILLED_SYRINGE | INTRAVENOUS | Status: DC | PRN
  Administered 2023-10-09: 5 mg via INTRAVENOUS
  Administered 2023-10-09: 10 mg via INTRAVENOUS

## 2023-10-09 MED ORDER — DIPHENHYDRAMINE HCL 12.5 MG/5ML PO ELIX: 12.5000 mg | ORAL_SOLUTION | ORAL | Status: DC | PRN

## 2023-10-09 MED ORDER — METOCLOPRAMIDE HCL 5 MG/ML IJ SOLN: 5.0000 mg | Freq: Three times a day (TID) | INTRAMUSCULAR | Status: DC | PRN

## 2023-10-09 MED ORDER — LIDOCAINE HCL (PF) 2 % IJ SOLN
INTRAMUSCULAR | Status: AC
  Filled 2023-10-09: qty 5

## 2023-10-09 MED ORDER — OXYCODONE HCL 5 MG PO TABS
5.0000 mg | ORAL_TABLET | ORAL | Status: DC | PRN
  Administered 2023-10-10: 5 mg via ORAL
  Filled 2023-10-09: qty 1

## 2023-10-09 MED ORDER — ONDANSETRON HCL 4 MG/2ML IJ SOLN
INTRAMUSCULAR | Status: AC
  Filled 2023-10-09: qty 2

## 2023-10-09 MED ORDER — NIFEDIPINE ER OSMOTIC RELEASE 60 MG PO TB24
60.0000 mg | ORAL_TABLET | Freq: Every day | ORAL | Status: DC
  Administered 2023-10-09 – 2023-10-11 (×3): 60 mg via ORAL
  Filled 2023-10-09 (×4): qty 1

## 2023-10-09 MED ORDER — ONDANSETRON HCL 4 MG/2ML IJ SOLN: 4.0000 mg | Freq: Four times a day (QID) | INTRAMUSCULAR | Status: DC | PRN

## 2023-10-09 MED ORDER — PROPOFOL 500 MG/50ML IV EMUL
INTRAVENOUS | Status: DC | PRN
  Administered 2023-10-09: 10 mg via INTRAVENOUS
  Administered 2023-10-09: 107.5 ug/kg/min via INTRAVENOUS
  Administered 2023-10-09: 10 mg via INTRAVENOUS

## 2023-10-09 MED ORDER — TRANEXAMIC ACID-NACL 1000-0.7 MG/100ML-% IV SOLN
1000.0000 mg | INTRAVENOUS | Status: AC
Start: 2023-10-09 — End: 2023-10-09
  Administered 2023-10-09: 1000 mg via INTRAVENOUS
  Filled 2023-10-09: qty 100

## 2023-10-09 MED ORDER — ONDANSETRON HCL 4 MG PO TABS: 4.0000 mg | ORAL_TABLET | Freq: Four times a day (QID) | ORAL | Status: DC | PRN

## 2023-10-09 MED ORDER — PHENOL 1.4 % MT LIQD: 1.0000 | OROMUCOSAL | Status: DC | PRN

## 2023-10-09 MED ORDER — HYDROMORPHONE HCL 1 MG/ML IJ SOLN: 0.5000 mg | INTRAMUSCULAR | Status: DC | PRN

## 2023-10-09 MED ORDER — DEXAMETHASONE SODIUM PHOSPHATE 10 MG/ML IJ SOLN
10.0000 mg | Freq: Once | INTRAMUSCULAR | Status: AC
  Administered 2023-10-10: 10 mg via INTRAVENOUS
  Filled 2023-10-09: qty 1

## 2023-10-09 MED ORDER — ACETAMINOPHEN 325 MG PO TABS: 325.0000 mg | ORAL_TABLET | Freq: Four times a day (QID) | ORAL | Status: DC | PRN

## 2023-10-09 MED ORDER — SODIUM CHLORIDE (PF) 0.9 % IJ SOLN
INTRAMUSCULAR | Status: DC | PRN
  Administered 2023-10-09: 61 mL

## 2023-10-09 MED ORDER — METHOCARBAMOL 1000 MG/10ML IJ SOLN: 500.0000 mg | Freq: Four times a day (QID) | INTRAMUSCULAR | Status: DC | PRN

## 2023-10-09 MED ORDER — ACETAMINOPHEN 500 MG PO TABS
1000.0000 mg | ORAL_TABLET | Freq: Four times a day (QID) | ORAL | Status: DC
  Administered 2023-10-09 – 2023-10-11 (×8): 1000 mg via ORAL
  Filled 2023-10-09 (×10): qty 2

## 2023-10-09 MED ORDER — METHOCARBAMOL 500 MG PO TABS
500.0000 mg | ORAL_TABLET | Freq: Four times a day (QID) | ORAL | Status: DC | PRN
  Administered 2023-10-09 – 2023-10-11 (×3): 500 mg via ORAL
  Filled 2023-10-09 (×3): qty 1

## 2023-10-09 MED ORDER — ASPIRIN 81 MG PO CHEW
81.0000 mg | CHEWABLE_TABLET | Freq: Two times a day (BID) | ORAL | Status: DC
  Administered 2023-10-09 – 2023-10-11 (×4): 81 mg via ORAL
  Filled 2023-10-09 (×4): qty 1

## 2023-10-09 MED ORDER — TRIAMTERENE-HCTZ 37.5-25 MG PO TABS
1.0000 | ORAL_TABLET | Freq: Every day | ORAL | Status: DC
  Administered 2023-10-10 – 2023-10-11 (×2): 1 via ORAL
  Filled 2023-10-09 (×2): qty 1

## 2023-10-09 MED ORDER — BISACODYL 10 MG RE SUPP: 10.0000 mg | Freq: Every day | RECTAL | Status: DC | PRN

## 2023-10-09 MED ORDER — 0.9 % SODIUM CHLORIDE (POUR BTL) OPTIME
TOPICAL | Status: DC | PRN
  Administered 2023-10-09: 1000 mL

## 2023-10-09 MED ORDER — LIDOCAINE HCL (PF) 2 % IJ SOLN
INTRAMUSCULAR | Status: DC | PRN
  Administered 2023-10-09: 40 mg via INTRADERMAL

## 2023-10-09 MED ORDER — SODIUM CHLORIDE 0.9 % IV SOLN: INTRAVENOUS | Status: DC

## 2023-10-09 MED ORDER — FENTANYL CITRATE PF 50 MCG/ML IJ SOSY: 25.0000 ug | PREFILLED_SYRINGE | INTRAMUSCULAR | Status: DC | PRN

## 2023-10-09 MED ORDER — BUPIVACAINE IN DEXTROSE 0.75-8.25 % IT SOLN
INTRATHECAL | Status: DC | PRN
  Administered 2023-10-09: 1.6 mL via INTRATHECAL

## 2023-10-09 MED ORDER — CEFAZOLIN SODIUM-DEXTROSE 2-4 GM/100ML-% IV SOLN
2.0000 g | Freq: Four times a day (QID) | INTRAVENOUS | Status: AC
  Administered 2023-10-09 (×2): 2 g via INTRAVENOUS
  Filled 2023-10-09 (×2): qty 100

## 2023-10-09 MED ORDER — MIDAZOLAM HCL 5 MG/5ML IJ SOLN
INTRAMUSCULAR | Status: DC | PRN
  Administered 2023-10-09: .5 mg via INTRAVENOUS

## 2023-10-09 MED ORDER — POVIDONE-IODINE 10 % EX SWAB: 2.0000 | Freq: Once | CUTANEOUS | Status: DC

## 2023-10-09 MED ORDER — METOCLOPRAMIDE HCL 5 MG PO TABS: 5.0000 mg | ORAL_TABLET | Freq: Three times a day (TID) | ORAL | Status: DC | PRN

## 2023-10-09 MED ORDER — SENNA 8.6 MG PO TABS
2.0000 | ORAL_TABLET | Freq: Every day | ORAL | Status: DC
  Administered 2023-10-09: 17.2 mg via ORAL
  Filled 2023-10-09 (×2): qty 2

## 2023-10-09 MED ORDER — ALUM & MAG HYDROXIDE-SIMETH 200-200-20 MG/5ML PO SUSP: 30.0000 mL | ORAL | Status: DC | PRN

## 2023-10-09 MED ORDER — ACETAMINOPHEN 10 MG/ML IV SOLN
1000.0000 mg | Freq: Once | INTRAVENOUS | Status: DC | PRN
Start: 2023-10-09 — End: 2023-10-09

## 2023-10-09 MED ORDER — TRANEXAMIC ACID-NACL 1000-0.7 MG/100ML-% IV SOLN
1000.0000 mg | Freq: Once | INTRAVENOUS | Status: AC
  Administered 2023-10-09: 1000 mg via INTRAVENOUS
  Filled 2023-10-09: qty 100

## 2023-10-09 MED ORDER — TRAMADOL HCL 50 MG PO TABS
50.0000 mg | ORAL_TABLET | Freq: Four times a day (QID) | ORAL | Status: DC | PRN
  Administered 2023-10-09: 50 mg via ORAL
  Administered 2023-10-10 – 2023-10-11 (×2): 100 mg via ORAL
  Filled 2023-10-09: qty 1
  Filled 2023-10-09 (×2): qty 2

## 2023-10-09 MED ORDER — CEFAZOLIN SODIUM-DEXTROSE 2-4 GM/100ML-% IV SOLN
2.0000 g | INTRAVENOUS | Status: AC
Start: 2023-10-09 — End: 2023-10-09
  Administered 2023-10-09: 2 g via INTRAVENOUS
  Filled 2023-10-09: qty 100

## 2023-10-09 MED ORDER — ORAL CARE MOUTH RINSE: 15.0000 mL | Freq: Once | OROMUCOSAL | Status: AC

## 2023-10-09 MED ORDER — DEXAMETHASONE SODIUM PHOSPHATE 10 MG/ML IJ SOLN
INTRAMUSCULAR | Status: AC
  Filled 2023-10-09: qty 1

## 2023-10-09 MED ORDER — CHLORHEXIDINE GLUCONATE 0.12 % MT SOLN
15.0000 mL | Freq: Once | OROMUCOSAL | Status: AC
  Administered 2023-10-09: 15 mL via OROMUCOSAL

## 2023-10-09 SURGICAL SUPPLY — 37 items
BAG COUNTER SPONGE SURGICOUNT (BAG) IMPLANT
BAG ZIPLOCK 12X15 (MISCELLANEOUS) ×2 IMPLANT
BLADE SAG 18X100X1.27 (BLADE) ×2 IMPLANT
COVER PERINEAL POST (MISCELLANEOUS) ×2 IMPLANT
COVER SURGICAL LIGHT HANDLE (MISCELLANEOUS) ×2 IMPLANT
CUP ACETBLR 52 OD PINNACLE (Hips) IMPLANT
DERMABOND ADVANCED .7 DNX12 (GAUZE/BANDAGES/DRESSINGS) ×2 IMPLANT
DRAPE FOOT SWITCH (DRAPES) ×2 IMPLANT
DRAPE STERI IOBAN 125X83 (DRAPES) ×2 IMPLANT
DRAPE U-SHAPE 47X51 STRL (DRAPES) ×4 IMPLANT
DRESSING AQUACEL AG SP 3.5X10 (GAUZE/BANDAGES/DRESSINGS) ×2 IMPLANT
DURAPREP 26ML APPLICATOR (WOUND CARE) ×2 IMPLANT
ELECT REM PT RETURN 15FT ADLT (MISCELLANEOUS) ×2 IMPLANT
GLOVE BIO SURGEON STRL SZ 6 (GLOVE) ×2 IMPLANT
GLOVE BIOGEL PI IND STRL 6.5 (GLOVE) ×2 IMPLANT
GLOVE BIOGEL PI IND STRL 7.5 (GLOVE) ×2 IMPLANT
GLOVE ORTHO TXT STRL SZ7.5 (GLOVE) ×4 IMPLANT
GOWN STRL REUS W/ TWL LRG LVL3 (GOWN DISPOSABLE) ×4 IMPLANT
HEAD CERAMIC DELTA 36 PLUS 1.5 (Hips) IMPLANT
HOLDER FOLEY CATH W/STRAP (MISCELLANEOUS) ×2 IMPLANT
KIT TURNOVER KIT A (KITS) ×2 IMPLANT
LINER NEUTRAL 52X36MM PLUS 4 (Liner) IMPLANT
MANIFOLD NEPTUNE II (INSTRUMENTS) ×2 IMPLANT
NDL SAFETY ECLIPSE 18X1.5 (NEEDLE) ×2 IMPLANT
PACK ANTERIOR HIP CUSTOM (KITS) ×2 IMPLANT
PENCIL SMOKE EVACUATOR (MISCELLANEOUS) ×2 IMPLANT
SCREW 6.5MMX30MM (Screw) IMPLANT
STEM FEM ACTIS HIGH SZ2 (Stem) IMPLANT
SUT MNCRL AB 4-0 PS2 18 (SUTURE) ×2 IMPLANT
SUT VIC AB 1 CT1 36 (SUTURE) ×6 IMPLANT
SUT VIC AB 2-0 CT1 TAPERPNT 27 (SUTURE) ×4 IMPLANT
SUTURE STRATFX 0 PDS 27 VIOLET (SUTURE) ×2 IMPLANT
SYR 3ML LL SCALE MARK (SYRINGE) ×2 IMPLANT
TOWEL GREEN STERILE FF (TOWEL DISPOSABLE) ×2 IMPLANT
TRAY FOLEY MTR SLVR 16FR STAT (SET/KITS/TRAYS/PACK) ×2 IMPLANT
TUBE SUCTION HIGH CAP CLEAR NV (SUCTIONS) ×2 IMPLANT
WATER STERILE IRR 1000ML POUR (IV SOLUTION) ×2 IMPLANT

## 2023-10-09 NOTE — Plan of Care (Signed)

## 2023-10-09 NOTE — Anesthesia Procedure Notes (Signed)
 Procedure Name: MAC Date/Time: 10/09/2023 8:49 AM  Performed by: Erick Fitz, CRNAPre-anesthesia Checklist: Patient identified, Emergency Drugs available, Suction available, Patient being monitored and Timeout performed Patient Re-evaluated:Patient Re-evaluated prior to induction Oxygen Delivery Method: Simple face mask Preoxygenation: Pre-oxygenation with 100% oxygen Placement Confirmation: positive ETCO2 Dental Injury: Teeth and Oropharynx as per pre-operative assessment

## 2023-10-09 NOTE — Anesthesia Procedure Notes (Signed)
 Spinal  Patient location during procedure: OR Start time: 10/09/2023 8:32 AM End time: 10/09/2023 8:34 AM Staffing Performed: anesthesiologist  Anesthesiologist: Dorethea Cordella SQUIBB, DO Performed by: Dorethea Cordella SQUIBB, DO Authorized by: Dorethea Cordella SQUIBB, DO   Preanesthetic Checklist Completed: patient identified, IV checked, site marked, risks and benefits discussed, surgical consent, monitors and equipment checked, pre-op evaluation and timeout performed Spinal Block Patient position: sitting Prep: DuraPrep Patient monitoring: heart rate, cardiac monitor, continuous pulse ox and blood pressure Approach: midline Location: L3-4 Injection technique: single-shot Needle Needle type: Pencan  Needle gauge: 24 G Needle length: 10 cm Assessment Events: CSF return Additional Notes Patient identified. Risks/Benefits/Options discussed with patient including but not limited to bleeding, infection, nerve damage, paralysis, failed block, incomplete pain control, headache, blood pressure changes, nausea, vomiting, reactions to medications, itching and postpartum back pain. Confirmed with bedside nurse the patient's most recent platelet count. Confirmed with patient that they are not currently taking any anticoagulation, have any bleeding history or any family history of bleeding disorders. Patient expressed understanding and wished to proceed. All questions were answered. Sterile technique was used throughout the entire procedure. Please see nursing notes for vital signs. Warning signs of high block given to the patient including shortness of breath, tingling/numbness in hands, complete motor block, or any concerning symptoms with instructions to call for help. Patient was given instructions on fall risk and not to get out of bed. All questions and concerns addressed with instructions to call with any issues or inadequate analgesia.

## 2023-10-09 NOTE — Interval H&P Note (Signed)
 History and Physical Interval Note:  10/09/2023 6:44 AM  Christina Carroll  has presented today for surgery, with the diagnosis of Right Hip Osteoarthritis.  The various methods of treatment have been discussed with the patient and family. After consideration of risks, benefits and other options for treatment, the patient has consented to  Procedure(s): ARTHROPLASTY, HIP, TOTAL, ANTERIOR APPROACH (Right) as a surgical intervention.  The patient's history has been reviewed, patient examined, no change in status, stable for surgery.  I have reviewed the patient's chart and labs.  Questions were answered to the patient's satisfaction.     Donnice JONETTA Car

## 2023-10-09 NOTE — Anesthesia Postprocedure Evaluation (Signed)
 Anesthesia Post Note  Patient: Christina Carroll  Procedure(s) Performed: ARTHROPLASTY, HIP, TOTAL, ANTERIOR APPROACH (Right: Hip)     Patient location during evaluation: PACU Anesthesia Type: MAC and Spinal Level of consciousness: awake and alert Pain management: pain level controlled Vital Signs Assessment: post-procedure vital signs reviewed and stable Respiratory status: spontaneous breathing, nonlabored ventilation, respiratory function stable and patient connected to nasal cannula oxygen Cardiovascular status: stable and blood pressure returned to baseline Postop Assessment: no apparent nausea or vomiting Anesthetic complications: no   No notable events documented.  Last Vitals:  Vitals:   10/09/23 1506 10/09/23 1704  BP: 114/62 (!) 99/57  Pulse: 70 68  Resp: 16 17  Temp: (!) 36.4 C 36.5 C  SpO2: 99% 100%    Last Pain:  Vitals:   10/09/23 1712  TempSrc:   PainSc: 4                  [name] P Stasha Naraine

## 2023-10-09 NOTE — Op Note (Signed)
 NAME:  Christina Carroll                ACCOUNT NO.: [id number]      MEDICAL RECORD NO.: [id number]      FACILITY:  [facility]      PHYSICIAN:  Donnice JONETTA Car  DATE OF BIRTH:  Sep 24, 1945     DATE OF PROCEDURE:  10/09/2023                                 OPERATIVE REPORT         PREOPERATIVE DIAGNOSIS: Right  hip osteoarthritis.      POSTOPERATIVE DIAGNOSIS:  Right hip osteoarthritis.      PROCEDURE:  Right total hip replacement through an anterior approach   utilizing DePuy THR system, component size 52 mm pinnacle cup, a size 36+4 neutral   Altrex liner, a size 2 Hi Actis stem with a 36+1.5 delta ceramic   ball.      SURGEON:  Donnice JONETTA. Car, M.D.      ASSISTANT:  Rosina Calin, PA-C     ANESTHESIA:  Spinal.      SPECIMENS:  None.      COMPLICATIONS:  None.      BLOOD LOSS:  150 cc     DRAINS:  None.      INDICATION OF THE PROCEDURE:  Christina Carroll is a 78 y.o. female who had   presented to office for evaluation of right hip pain.  Radiographs revealed   progressive degenerative changes with bone-on-bone   articulation of the  hip joint, including subchondral cystic changes and osteophytes.  The patient had painful limited range of   motion significantly affecting their overall quality of life and function.  The patient was failing to    respond to conservative measures including medications and/or injections and activity modification and at this point was ready   to proceed with more definitive measures.  Consent was obtained for   benefit of pain relief.  Specific risks of infection, DVT, component   failure, dislocation, neurovascular injury, and need for revision surgery were reviewed in the office.     PROCEDURE IN DETAIL:  The patient was brought to operative theater.   Once adequate anesthesia, preoperative antibiotics, 2 gm of Ancef , 1 gm of Tranexamic Acid , and 10 mg of Decadron  were administered, the patient was positioned  supine on the [employer] table.  Once the patient was safely positioned with adequate padding of boney prominences we predraped out the hip, and used fluoroscopy to confirm orientation of the pelvis.      The right hip was then prepped and draped from proximal iliac crest to   mid thigh with a shower curtain technique.      Time-out was performed identifying the patient, planned procedure, and the appropriate extremity.     An incision was then made 2 cm lateral to the   anterior superior iliac spine extending over the orientation of the   tensor fascia lata muscle and sharp dissection was carried down to the   fascia of the muscle.      The fascia was then incised.  The muscle belly was identified and swept   laterally and retractor placed along the superior neck.  Following   cauterization of the circumflex vessels and removing some pericapsular   fat, a second cobra retractor was placed on the inferior  neck.  A T-capsulotomy was made along the line of the   superior neck to the trochanteric fossa, then extended proximally and   distally.  Tag sutures were placed and the retractors were then placed   intracapsular.  We then identified the trochanteric fossa and   orientation of my neck cut and then made a neck osteotomy with the femur on traction.  The femoral   head was removed without difficulty or complication.  Traction was let   off and retractors were placed posterior and anterior around the   acetabulum.      The labrum and foveal tissue were debrided.  I began reaming with a 47 mm   reamer and reamed up to 51 mm reamer with good bony bed preparation and a 52 mm  cup was chosen.  The final 52 mm Pinnacle cup was then impacted under fluoroscopy to confirm the depth of penetration and orientation with respect to   Abduction and forward flexion.  A screw was placed into the ilium followed by the hole eliminator.  The final   36+4 neutral Altrex liner was impacted with good visualized  rim fit.  The cup was positioned anatomically within the acetabular portion of the pelvis.      At this point, the femur was rolled to 100 degrees.  Further capsule was   released off the inferior aspect of the femoral neck.  I then   released the superior capsule proximally.  With the leg in a neutral position the hook was placed laterally   along the femur under the vastus lateralis origin and elevated manually and then held in position using the hook attachment on the bed.  The leg was then extended and adducted with the leg rolled to 100   degrees of external rotation.  Retractors were placed along the medial calcar and posteriorly over the greater trochanter.  Once the proximal femur was fully   exposed, I used a box osteotome to set orientation.  I then began   broaching with the starting chili pepper broach and passed this by hand and then broached up to 2.  With the 2 broach in place I chose a high offset neck and did several trial reductions.  The offset was appropriate, leg lengths   appeared to be equal best matched with the +1.5 head ball trial confirmed radiographically.   Given these findings, I went ahead and dislocated the hip, repositioned all   retractors and positioned the right hip in the extended and abducted position.  The final 2 Hi Actis stem was   chosen and it was impacted down to the level of neck cut.  Based on this   and the trial reductions, a final 36+1.5 delta ceramic ball was chosen and   impacted onto a clean and dry trunnion, and the hip was reduced.  The   hip had been irrigated throughout the case again at this point.  I did   reapproximate the superior capsular leaflet to the anterior leaflet   using #1 Vicryl.  The fascia of the   tensor fascia lata muscle was then reapproximated using #1 Vicryl and #0 Stratafix sutures.  The   remaining wound was closed with 2-0 Vicryl and running 4-0 Monocryl.   The hip was cleaned, dried, and dressed sterilely using  Dermabond and   Aquacel dressing.  The patient was then brought   to recovery room in stable condition tolerating the procedure well.    Rosina  Patti, PA-C was present for the entirety of the case involved from   preoperative positioning, perioperative retractor management, general   facilitation of the case, as well as primary wound closure as assistant.            Donnice CORDOBA Ernie, M.D.        10/09/2023 8:30 AM

## 2023-10-09 NOTE — Discharge Instructions (Signed)

## 2023-10-09 NOTE — Evaluation (Signed)
 Physical Therapy Evaluation Patient Details Name: Christina Carroll MRN: [id number] DOB: 10-29-1945 Today's Date: 10/09/2023  History of Present Illness  Pt is 78 yo female admitted s/p R anterior THA on 10/09/23.  Pt with hx including but not limited to arthritis, HLD, HTN, hypothyroidism  Clinical Impression  Pt is s/p THA resulting in the deficits listed below (see PT Problem List). At baseline, pt lives alone and is independent.  She has arranged for assist at d/c.  Today, pt with good pain control, pleasant, and motivated.  She ambulated 59' with RW and CGA; min A for transfers.  Pt expected to progress well.  Pt will benefit from acute skilled PT to increase their independence and safety with mobility to facilitate discharge. Pt did ask about therapy at d/c - discussed plan per MD is typically return home with HEP and walking and that generally pt progress well with this.           If plan is discharge home, recommend the following: A little help with walking and/or transfers;A little help with bathing/dressing/bathroom;Assistance with cooking/housework;Help with stairs or ramp for entrance   Can travel by private vehicle        Equipment Recommendations Rolling walker (2 wheels)  Recommendations for Other Services       Functional Status Assessment Patient has had a recent decline in their functional status and demonstrates the ability to make significant improvements in function in a reasonable and predictable amount of time.     Precautions / Restrictions Precautions Precautions: Fall Restrictions Weight Bearing Restrictions Per Provider Order: Yes RLE Weight Bearing Per Provider Order: Weight bearing as tolerated      Mobility  Bed Mobility Overal bed mobility: Needs Assistance Bed Mobility: Supine to Sit     Supine to sit: Min assist          Transfers Overall transfer level: Needs assistance Equipment used: Rolling walker (2 wheels) Transfers: Sit  to/from Stand Sit to Stand: Min assist           General transfer comment: light min A; cues for hand placement and R LE management    Ambulation/Gait Ambulation/Gait assistance: Contact guard assist Gait Distance (Feet): 50 Feet Assistive device: Rolling walker (2 wheels) Gait Pattern/deviations: Step-to pattern, Decreased stride length, Decreased weight shift to right Gait velocity: decreased     General Gait Details: overall steady gait; min cues for RW proximity and sequencing  Stairs            Wheelchair Mobility     Tilt Bed    Modified Rankin (Stroke Patients Only)       Balance Overall balance assessment: Needs assistance Sitting-balance support: No upper extremity supported Sitting balance-Leahy Scale: Good     Standing balance support: Bilateral upper extremity supported, Reliant on assistive device for balance Standing balance-Leahy Scale: Poor                               Pertinent Vitals/Pain Pain Assessment Pain Assessment: 0-10 Pain Score: 1  Pain Location: R hip Pain Descriptors / Indicators: Discomfort Pain Intervention(s): Limited activity within patient's tolerance, Monitored during session, Repositioned, Ice applied    Home Living Family/patient expects to be discharged to:: Private residence Living Arrangements: Alone Available Help at Discharge: Family;Available 24 hours/day (family/friends staying a few days) Type of Home: House Home Access: Stairs to enter Entrance Stairs-Rails:  (can reach door frame) Entrance Stairs-Number of  Steps: 2   Home Layout: One level Home Equipment: Shower seat;Grab bars - tub/shower;Cane - single point;BSC/3in1      Prior Function Prior Level of Function : Independent/Modified Independent;Driving             Mobility Comments: could ambulate in community without ad ADLs Comments: independent adls and iadls     Extremity/Trunk Assessment   Upper Extremity  Assessment Upper Extremity Assessment: Overall WFL for tasks assessed    Lower Extremity Assessment Lower Extremity Assessment: LLE deficits/detail;RLE deficits/detail RLE Deficits / Details: Expected post op changes.  ROM WFL.  MMT : ankle 5/5, knee3/5, hip 1/5 LLE Deficits / Details: ROM WFL; MMT 5/5    Cervical / Trunk Assessment Cervical / Trunk Assessment: Normal  Communication        Cognition Arousal: Alert Behavior During Therapy: WFL for tasks assessed/performed   PT - Cognitive impairments: No apparent impairments                                 Cueing       General Comments General comments (skin integrity, edema, etc.): VSS    Exercises     Assessment/Plan    PT Assessment Patient needs continued PT services  PT Problem List Decreased strength;Pain;Decreased range of motion;Decreased activity tolerance;Decreased balance;Decreased mobility;Decreased knowledge of use of DME       PT Treatment Interventions DME instruction;Therapeutic exercise;Gait training;Stair training;Functional mobility training;Patient/family education;Therapeutic activities;Modalities;Balance training    PT Goals (Current goals can be found in the Care Plan section)  Acute Rehab PT Goals Patient Stated Goal: return home PT Goal Formulation: With patient/family Time For Goal Achievement: 10/23/23 Potential to Achieve Goals: Good    Frequency 7X/week     Co-evaluation               AM-PAC PT 6 Clicks Mobility  Outcome Measure Help needed turning from your back to your side while in a flat bed without using bedrails?: A Little Help needed moving from lying on your back to sitting on the side of a flat bed without using bedrails?: A Little Help needed moving to and from a bed to a chair (including a wheelchair)?: A Little Help needed standing up from a chair using your arms (e.g., wheelchair or bedside chair)?: A Little Help needed to walk in hospital room?:  A Little Help needed climbing 3-5 steps with a railing? : A Little 6 Click Score: 18    End of Session Equipment Utilized During Treatment: Gait belt Activity Tolerance: Patient tolerated treatment well Patient left: with chair alarm set;in chair;with call bell/phone within reach Nurse Communication: Mobility status PT Visit Diagnosis: Other abnormalities of gait and mobility (R26.89);Muscle weakness (generalized) (M62.81)    Time: 8374-8344 PT Time Calculation (min) (ACUTE ONLY): 30 min   Charges:   PT Evaluation $PT Eval Low Complexity: 1 Low PT Treatments $Gait Training: 8-22 mins PT General Charges $$ ACUTE PT VISIT: 1 Visit         Benjiman, PT Acute Rehab [facility] [phone]   Benjiman VEAR Mulberry 10/09/2023, 5:03 PM

## 2023-10-09 NOTE — Transfer of Care (Signed)
 Immediate Anesthesia Transfer of Care Note  Patient: Christina Carroll  Procedure(s) Performed: ARTHROPLASTY, HIP, TOTAL, ANTERIOR APPROACH (Right: Hip)  Patient Location: PACU  Anesthesia Type:MAC and Spinal  Level of Consciousness: awake, alert , oriented, and patient cooperative  Airway & Oxygen Therapy: Patient Spontanous Breathing  Post-op Assessment: Report given to RN and Post -op Vital signs reviewed and stable  Post vital signs: Reviewed and stable  Last Vitals:  Vitals Value Taken Time  BP 125/64 10/09/23 10:00  Temp    Pulse 61 10/09/23 10:03  Resp 16 10/09/23 10:03  SpO2 98 % 10/09/23 10:03  Vitals shown include unfiled device data.  Last Pain:  Vitals:   10/09/23 0656  TempSrc:   PainSc: 0-No pain         Complications: No notable events documented.

## 2023-10-10 DIAGNOSIS — M1611 Unilateral primary osteoarthritis, right hip: Secondary | ICD-10-CM | POA: Diagnosis not present

## 2023-10-10 LAB — BASIC METABOLIC PANEL WITH GFR
Anion gap: 9 (ref 5–15)
BUN: 21 mg/dL (ref 8–23)
CO2: 26 mmol/L (ref 22–32)
Calcium: 8.8 mg/dL — ABNORMAL LOW (ref 8.9–10.3)
Chloride: 101 mmol/L (ref 98–111)
Creatinine, Ser: 1.21 mg/dL — ABNORMAL HIGH (ref 0.44–1.00)
GFR, Estimated: 47 mL/min — ABNORMAL LOW (ref >60)
Glucose, Bld: 138 mg/dL — ABNORMAL HIGH (ref 70–99)
Potassium: 3.4 mmol/L — ABNORMAL LOW (ref 3.5–5.1)
Sodium: 136 mmol/L (ref 135–145)

## 2023-10-10 LAB — CBC
HCT: 30 % — ABNORMAL LOW (ref 36.0–46.0)
Hemoglobin: 9.9 g/dL — ABNORMAL LOW (ref 12.0–15.0)
MCH: 26.8 pg (ref 26.0–34.0)
MCHC: 33 g/dL (ref 30.0–36.0)
MCV: 81.1 fL (ref 80.0–100.0)
Platelets: 270 K/uL (ref 150–400)
RBC: 3.7 MIL/uL — ABNORMAL LOW (ref 3.87–5.11)
RDW: 14.1 % (ref 11.5–15.5)
WBC: 17.2 K/uL — ABNORMAL HIGH (ref 4.0–10.5)
nRBC: 0 % (ref 0.0–0.2)

## 2023-10-10 NOTE — TOC Transition Note (Addendum)
 Transition of Care [facility]) - Discharge Note   Patient Details  Name: Christina Carroll MRN: [id number] Date of Birth: September 14, 1945  [facility]) CM/SW Contact:  Alfonse JONELLE Rex, RN Phone Number: 10/10/2023, 11:20 AM   Clinical Narrative:  Met with patient at bedside to review dc planning and home equipment needs, pt confirmed HEP,  RW delivered to bedside by [employer]. MOON completed. No TOC needs.        Barriers to Discharge: No Barriers Identified   Patient Goals and CMS Choice Patient states their goals for this hospitalization and ongoing recovery are:: return home          Discharge Placement                       Discharge Plan and Services Additional resources added to the After Visit Summary for                                       Social Drivers of Health (SDOH) Interventions SDOH Screenings   Food Insecurity: No Food Insecurity (10/09/2023)  Housing: Low Risk  (10/09/2023)  Transportation Needs: No Transportation Needs (10/09/2023)  Utilities: Not At Risk (10/09/2023)  Alcohol Screen: Low Risk  (06/16/2020)  Depression (PHQ2-9): Low Risk  (08/30/2023)  Financial Resource Strain: Low Risk  (07/02/2022)  Physical Activity: Unknown (07/02/2022)  Social Connections: Moderately Integrated (10/09/2023)  Stress: No Stress Concern Present (07/02/2022)  Tobacco Use: Low Risk  (10/09/2023)     Readmission Risk Interventions     No data to display

## 2023-10-10 NOTE — Progress Notes (Signed)
 Physical Therapy Treatment Patient Details Name: Christina Carroll MRN: [id number] DOB: 08/22/1945 Today's Date: 10/10/2023   History of Present Illness Pt is 78 yo female admitted s/p R anterior THA on 10/09/23.  Pt with hx including but not limited to arthritis, HLD, HTN, hypothyroidism    PT Comments  Pt expressed that she is anxious/worried about return home tomorrow.  Her son is there to assist until Sunday, reports they are working on getting supervision after that. She also expressed again that she would prefer to go to SNF or anywhere but home.  Educated/encouraged pt on better recovery at home and that she is progressing very well.  She is ambulating 59' with RW and CGA and only needing light min A for transfers - excellent progress for POD #1.  Pain is controlled with mobility. Cont POC.     If plan is discharge home, recommend the following: A little help with walking and/or transfers;A little help with bathing/dressing/bathroom;Assistance with cooking/housework;Help with stairs or ramp for entrance   Can travel by private vehicle        Equipment Recommendations  Rolling walker (2 wheels)    Recommendations for Other Services       Precautions / Restrictions Precautions Precautions: Fall Restrictions RLE Weight Bearing Per Provider Order: Weight bearing as tolerated     Mobility  Bed Mobility Overal bed mobility: Needs Assistance Bed Mobility: Sit to Supine     Supine to sit: Min assist     General bed mobility comments: light min A for R LE    Transfers Overall transfer level: Needs assistance Equipment used: Rolling walker (2 wheels) Transfers: Sit to/from Stand Sit to Stand: Contact guard assist           General transfer comment: cues for hand placement and R LE management    Ambulation/Gait Ambulation/Gait assistance: Contact guard assist Gait Distance (Feet): 80 Feet Assistive device: Rolling walker (2 wheels) Gait Pattern/deviations:  Decreased stride length, Decreased weight shift to right, Step-through pattern Gait velocity: decreased     General Gait Details: Good RW proximity and steady gait.  No signs of severe pain but does report pulling in R thigh when advancing.  Tried having pt do step to L gait so that she was not having to advance R LE as far - reports some improvement but after 8-10 steps reverts back to step through.   Stairs             Wheelchair Mobility     Tilt Bed    Modified Rankin (Stroke Patients Only)       Balance Overall balance assessment: Needs assistance Sitting-balance support: No upper extremity supported Sitting balance-Leahy Scale: Good     Standing balance support: Bilateral upper extremity supported, Reliant on assistive device for balance, No upper extremity supported Standing balance-Leahy Scale: Fair Standing balance comment: RW to ambulate - steady; could static stand witout support                            Communication    Cognition Arousal: Alert Behavior During Therapy: Anxious   PT - Cognitive impairments: No apparent impairments                       PT - Cognition Comments: Expressed worried/anxious about return home - son only available till Sunday, and states working on getting more supervision arranged  Cueing    Exercises Total Joint Exercises Ankle Circles/Pumps: AROM, Both, 10 reps, Supine Quad Sets: AROM, Both, 10 reps, Supine Heel Slides: AAROM, Right, 10 reps, Supine Hip ABduction/ADduction: AAROM, Right, 10 reps, Supine Long Arc Quad: AROM, Right, 10 reps, Seated    General Comments General comments (skin integrity, edema, etc.): son present      Pertinent Vitals/Pain Pain Assessment Pain Assessment: 0-10 Pain Score: 4  Pain Location: R hip Pain Descriptors / Indicators: Sore, Tightness Pain Intervention(s): Limited activity within patient's tolerance, Monitored during session, Premedicated before  session, Repositioned    Home Living                          Prior Function            PT Goals (current goals can now be found in the care plan section) Progress towards PT goals: Progressing toward goals    Frequency    7X/week      PT Plan      Co-evaluation              AM-PAC PT 6 Clicks Mobility   Outcome Measure  Help needed turning from your back to your side while in a flat bed without using bedrails?: A Little Help needed moving from lying on your back to sitting on the side of a flat bed without using bedrails?: A Little Help needed moving to and from a bed to a chair (including a wheelchair)?: A Little Help needed standing up from a chair using your arms (e.g., wheelchair or bedside chair)?: A Little Help needed to walk in hospital room?: A Little Help needed climbing 3-5 steps with a railing? : A Little 6 Click Score: 18    End of Session Equipment Utilized During Treatment: Gait belt Activity Tolerance: Patient tolerated treatment well Patient left: with call bell/phone within reach;in bed;with SCD's reapplied Nurse Communication: Mobility status PT Visit Diagnosis: Other abnormalities of gait and mobility (R26.89);Muscle weakness (generalized) (M62.81)     Time: 8644-8581 PT Time Calculation (min) (ACUTE ONLY): 23 min  Charges:    $Gait Training: 8-22 mins $Therapeutic Exercise: 8-22 mins PT General Charges $$ ACUTE PT VISIT: 1 Visit                     Christina Carroll, PT Acute Rehab [facility] [phone]    Christina Carroll Christina Carroll 10/10/2023, 3:04 PM

## 2023-10-10 NOTE — Care Management Obs Status (Signed)
 MEDICARE OBSERVATION STATUS NOTIFICATION   Patient Details  Name: Christina Carroll MRN: [id number] Date of Birth: 1945/10/29   Medicare Observation Status Notification Given:  Yes    Alfonse JONELLE Rex, RN 10/10/2023, 10:12 AM

## 2023-10-10 NOTE — Progress Notes (Signed)
 Physical Therapy Treatment Patient Details Name: Christina Carroll MRN: [id number] DOB: 1945-04-09 Today's Date: 10/10/2023   History of Present Illness Pt is 78 yo female admitted s/p R anterior THA on 10/09/23.  Pt with hx including but not limited to arthritis, HLD, HTN, hypothyroidism    PT Comments  Pt making good progress. She is somewhat anxious about movement and son having to assist with ADLs at home.  Today, only needing light min A for bed mobility and CGA for standing and walking 50'.  She did have mild dizziness but not lightheaded and all VSS.  Continue POC.  Noting per PA will plan d/c tomorrow if pt progressing well.     If plan is discharge home, recommend the following: A little help with walking and/or transfers;A little help with bathing/dressing/bathroom;Assistance with cooking/housework;Help with stairs or ramp for entrance   Can travel by private vehicle        Equipment Recommendations  Rolling walker (2 wheels)    Recommendations for Other Services       Precautions / Restrictions Precautions Precautions: Fall Restrictions RLE Weight Bearing Per Provider Order: Weight bearing as tolerated     Mobility  Bed Mobility Overal bed mobility: Needs Assistance Bed Mobility: Supine to Sit     Supine to sit: Min assist          Transfers Overall transfer level: Needs assistance Equipment used: Rolling walker (2 wheels) Transfers: Sit to/from Stand Sit to Stand: Contact guard assist           General transfer comment: cues for hand placement and R LE management    Ambulation/Gait Ambulation/Gait assistance: Contact guard assist Gait Distance (Feet): 50 Feet Assistive device: Rolling walker (2 wheels) Gait Pattern/deviations: Step-to pattern, Decreased stride length, Decreased weight shift to right Gait velocity: decreased     General Gait Details: overall steady gait; min cues for RW proximity and sequencing; reports some dizziness but not  lightheaded, VSS (HR 62 bpm, O2 100%, BP 114/66)   Stairs             Wheelchair Mobility     Tilt Bed    Modified Rankin (Stroke Patients Only)       Balance Overall balance assessment: Needs assistance Sitting-balance support: No upper extremity supported Sitting balance-Leahy Scale: Good     Standing balance support: Bilateral upper extremity supported, Reliant on assistive device for balance Standing balance-Leahy Scale: Poor Standing balance comment: steady with RW                            Communication    Cognition Arousal: Alert Behavior During Therapy: WFL for tasks assessed/performed   PT - Cognitive impairments: No apparent impairments                                Cueing    Exercises Total Joint Exercises Ankle Circles/Pumps: AROM, Both, 10 reps, Supine Quad Sets: AROM, Both, 10 reps, Supine Heel Slides: AAROM, Right, 10 reps, Supine Hip ABduction/ADduction: AAROM, Right, 10 reps, Supine Long Arc Quad: AROM, Right, 10 reps, Seated    General Comments General comments (skin integrity, edema, etc.): VSS      Pertinent Vitals/Pain Pain Assessment Pain Assessment: 0-10 Pain Score: 3  Pain Location: R hip Pain Descriptors / Indicators: Sore, Tightness Pain Intervention(s): Limited activity within patient's tolerance, Monitored during session, Premedicated before session,  Repositioned, Ice applied    Home Living                          Prior Function            PT Goals (current goals can now be found in the care plan section) Progress towards PT goals: Progressing toward goals    Frequency    7X/week      PT Plan      Co-evaluation              AM-PAC PT 6 Clicks Mobility   Outcome Measure  Help needed turning from your back to your side while in a flat bed without using bedrails?: A Little Help needed moving from lying on your back to sitting on the side of a flat bed without  using bedrails?: A Little Help needed moving to and from a bed to a chair (including a wheelchair)?: A Little Help needed standing up from a chair using your arms (e.g., wheelchair or bedside chair)?: A Little Help needed to walk in hospital room?: A Little Help needed climbing 3-5 steps with a railing? : A Little 6 Click Score: 18    End of Session Equipment Utilized During Treatment: Gait belt Activity Tolerance: Patient tolerated treatment well Patient left: with chair alarm set;in chair;with call bell/phone within reach Nurse Communication: Mobility status PT Visit Diagnosis: Other abnormalities of gait and mobility (R26.89);Muscle weakness (generalized) (M62.81)     Time: 8988-8967 PT Time Calculation (min) (ACUTE ONLY): 21 min  Charges:    $Gait Training: 8-22 mins PT General Charges $$ ACUTE PT VISIT: 1 Visit                     Benjiman, PT Acute Rehab [facility] [phone]    Benjiman VEAR Mulberry 10/10/2023, 10:41 AM

## 2023-10-10 NOTE — Plan of Care (Signed)

## 2023-10-10 NOTE — Progress Notes (Signed)
   Subjective: 1 Day Post-Op Procedure(s) (LRB): ARTHROPLASTY, HIP, TOTAL, ANTERIOR APPROACH (Right) Patient reports pain as mild.   Patient seen in rounds for Dr. Ernie. Patient is resting in bed on exam this morning. No acute events overnight. Foley catheter removed. Patient ambulated 50 feet with PT yesterday. She has limited help at home and is very concerned about being safe at discharge.  We will start therapy today.   Objective: Vital signs in last 24 hours: Temp:  [97.4 F (36.3 C)-98.1 F (36.7 C)] 98.1 F (36.7 C) (07/23 0514) Pulse Rate:  [49-77] 60 (07/23 0514) Resp:  [11-22] 18 (07/23 0514) BP: (78-125)/(45-76) 101/61 (07/23 0514) SpO2:  [93 %-100 %] 99 % (07/23 0514)  Intake/Output from previous day:  Intake/Output Summary (Last 24 hours) at 10/10/2023 0914 Last data filed at 10/10/2023 0700 Gross per 24 hour  Intake 2528.33 ml  Output 1375 ml  Net 1153.33 ml     Intake/Output this shift: No intake/output data recorded.  Labs: Recent Labs    10/10/23 0339  HGB 9.9*   Recent Labs    10/10/23 0339  WBC 17.2*  RBC 3.70*  HCT 30.0*  PLT 270   Recent Labs    10/10/23 0339  NA 136  K 3.4*  CL 101  CO2 26  BUN 21  CREATININE 1.21*  GLUCOSE 138*  CALCIUM  8.8*   No results for input(s): LABPT, INR in the last 72 hours.  Exam: General - Patient is Alert and Oriented Extremity - Neurologically intact Sensation intact distally Dressing - dressing C/D/I Motor Function - intact, moving foot and toes well on exam.   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   Weight as of this encounter: 62 kg. Advance diet Up with therapy D/C IV fluids  DVT Prophylaxis - Aspirin  Weight bearing as tolerated.  Hgb stable at 9.9 this AM.  We will plan to keep her overnight to continue working on PT tomorrow.  She is very concerned about her safety at home I feel she needs an additional day to continue progressing with PT and initially considered SNF which I do not recommend   Rosina Calin, PA-C Orthopedic Surgery [phone] 10/10/2023, 9:14 AM

## 2023-10-11 ENCOUNTER — Other Ambulatory Visit (HOSPITAL_COMMUNITY): Payer: Self-pay

## 2023-10-11 DIAGNOSIS — M1611 Unilateral primary osteoarthritis, right hip: Secondary | ICD-10-CM | POA: Diagnosis not present

## 2023-10-11 LAB — CBC
HCT: 30.3 % — ABNORMAL LOW (ref 36.0–46.0)
Hemoglobin: 10.2 g/dL — ABNORMAL LOW (ref 12.0–15.0)
MCH: 27 pg (ref 26.0–34.0)
MCHC: 33.7 g/dL (ref 30.0–36.0)
MCV: 80.2 fL (ref 80.0–100.0)
Platelets: 278 K/uL (ref 150–400)
RBC: 3.78 MIL/uL — ABNORMAL LOW (ref 3.87–5.11)
RDW: 14 % (ref 11.5–15.5)
WBC: 16.1 K/uL — ABNORMAL HIGH (ref 4.0–10.5)
nRBC: 0 % (ref 0.0–0.2)

## 2023-10-11 MED ORDER — METHOCARBAMOL 500 MG PO TABS
500.0000 mg | ORAL_TABLET | Freq: Four times a day (QID) | ORAL | 0 refills | Status: DC | PRN
  Filled 2023-10-11: qty 40, 10d supply, fill #0

## 2023-10-11 MED ORDER — POLYETHYLENE GLYCOL 3350 17 GM/SCOOP PO POWD
17.0000 g | Freq: Two times a day (BID) | ORAL | 0 refills | Status: DC
  Filled 2023-10-11: qty 238, 7d supply, fill #0

## 2023-10-11 MED ORDER — POTASSIUM CHLORIDE CRYS ER 20 MEQ PO TBCR
40.0000 meq | EXTENDED_RELEASE_TABLET | Freq: Once | ORAL | Status: AC
  Administered 2023-10-11: 40 meq via ORAL
  Filled 2023-10-11: qty 2

## 2023-10-11 MED ORDER — ASPIRIN 81 MG PO CHEW
81.0000 mg | CHEWABLE_TABLET | Freq: Two times a day (BID) | ORAL | 0 refills | Status: AC
  Filled 2023-10-11: qty 56, 28d supply, fill #0

## 2023-10-11 MED ORDER — TRAMADOL HCL 50 MG PO TABS
50.0000 mg | ORAL_TABLET | Freq: Four times a day (QID) | ORAL | 0 refills | Status: DC | PRN
  Filled 2023-10-11: qty 30, 7d supply, fill #0

## 2023-10-11 MED ORDER — SODIUM CHLORIDE 0.9 % IV BOLUS
250.0000 mL | Freq: Once | INTRAVENOUS | Status: AC
  Administered 2023-10-11: 250 mL via INTRAVENOUS

## 2023-10-11 MED ORDER — ACETAMINOPHEN 500 MG PO TABS: 1000.0000 mg | ORAL_TABLET | Freq: Four times a day (QID) | ORAL | Status: DC

## 2023-10-11 NOTE — Plan of Care (Signed)
 Discharge instructions given to the patient including medications and follow up.

## 2023-10-11 NOTE — Progress Notes (Signed)
 Physical Therapy Treatment Patient Details Name: Christina Carroll MRN: [id number] DOB: December 20, 1945 Today's Date: 10/11/2023   History of Present Illness Pt is 78 yo female admitted s/p R anterior THA on 10/09/23.  Pt with hx including but not limited to arthritis, HLD, HTN, hypothyroidism    PT Comments  Pt ambulated in hallway again and performed LE exercises.  Son present for session this afternoon.  Pt provided with HEP handout.  Pt and son had no further questions.  Pt is ready for d/c home today.     If plan is discharge home, recommend the following: A little help with walking and/or transfers;A little help with bathing/dressing/bathroom;Assistance with cooking/housework;Help with stairs or ramp for entrance   Can travel by private vehicle        Equipment Recommendations  Rolling walker (2 wheels)    Recommendations for Other Services       Precautions / Restrictions Precautions Precautions: Fall Restrictions RLE Weight Bearing Per Provider Order: Weight bearing as tolerated     Mobility  Bed Mobility Overal bed mobility: Needs Assistance Bed Mobility: Supine to Sit     Supine to sit: Supervision     General bed mobility comments: pt in recliner    Transfers Overall transfer level: Needs assistance Equipment used: Rolling walker (2 wheels) Transfers: Sit to/from Stand Sit to Stand: Supervision           General transfer comment: ambulated into bathroom and used without assist prior to going into hallway    Ambulation/Gait Ambulation/Gait assistance: Supervision Gait Distance (Feet): 140 Feet Assistive device: Rolling walker (2 wheels) Gait Pattern/deviations: Decreased stride length, Decreased weight shift to right, Step-through pattern Gait velocity: decreased     General Gait Details: mildly antalgic gait but steady with RW, distance per pt tolerance   Stairs Stairs: Yes   Stair Management: Step to pattern, Forwards, Two rails Number of  Stairs: 3 General stair comments: verbal cues for safety and sequence   Wheelchair Mobility     Tilt Bed    Modified Rankin (Stroke Patients Only)       Balance                                            Communication Communication Communication: No apparent difficulties  Cognition Arousal: Alert Behavior During Therapy: WFL for tasks assessed/performed   PT - Cognitive impairments: No apparent impairments                         Following commands: Intact      Cueing    Exercises Total Joint Exercises Ankle Circles/Pumps: AROM, Both, 10 reps, Supine Quad Sets: AROM, Both, 10 reps, Supine Heel Slides: AAROM, Right, 10 reps, Supine Hip ABduction/ADduction: AAROM, Right, 10 reps, Supine, Standing, AROM Long Arc Quad: AROM, Right, 10 reps, Seated Knee Flexion: AROM, Right, Standing, 10 reps Marching in Standing: AROM, 10 reps, Right, Standing Standing Hip Extension: AROM, Right, Standing, 10 reps    General Comments        Pertinent Vitals/Pain Pain Assessment Pain Assessment: 0-10 Pain Score: 3  Pain Location: R hip Pain Descriptors / Indicators: Sore Pain Intervention(s): Repositioned, Monitored during session    Home Living  Prior Function            PT Goals (current goals can now be found in the care plan section) Progress towards PT goals: Progressing toward goals    Frequency    7X/week      PT Plan      Co-evaluation              AM-PAC PT 6 Clicks Mobility   Outcome Measure  Help needed turning from your back to your side while in a flat bed without using bedrails?: A Little Help needed moving from lying on your back to sitting on the side of a flat bed without using bedrails?: A Little Help needed moving to and from a bed to a chair (including a wheelchair)?: A Little Help needed standing up from a chair using your arms (e.g., wheelchair or bedside chair)?: A  Little Help needed to walk in hospital room?: A Little Help needed climbing 3-5 steps with a railing? : A Little 6 Click Score: 18    End of Session Equipment Utilized During Treatment: Gait belt Activity Tolerance: Patient tolerated treatment well Patient left: in chair;with family/visitor present;with call bell/phone within reach Nurse Communication: Mobility status PT Visit Diagnosis: Other abnormalities of gait and mobility (R26.89)     Time: 8665-8645 PT Time Calculation (min) (ACUTE ONLY): 20 min  Charges:    $Therapeutic Exercise: 8-22 mins PT General Charges $$ ACUTE PT VISIT: 1 Visit                    Tari KLEIN, DPT Physical Therapist Acute Rehabilitation Services Office: [phone]    [name] 10/11/2023, 4:55 PM

## 2023-10-11 NOTE — Progress Notes (Signed)
   Subjective: 2 Days Post-Op Procedure(s) (LRB): ARTHROPLASTY, HIP, TOTAL, ANTERIOR APPROACH (Right) Patient reports pain as mild.   Patient seen in rounds for Dr. Ernie. Patient is resting in bed on exam this morning. No acute events overnight. Ambulated well with PT yesterday. [birth year]. She does want to try to get home today.  We will continue therapy today.   Objective: Vital signs in last 24 hours: Temp:  [97.4 F (36.3 C)-98.2 F (36.8 C)] 97.9 F (36.6 C) (07/24 0411) Pulse Rate:  [59-65] 65 (07/24 0411) Resp:  [16-17] 16 (07/24 0411) BP: (104-128)/(54-65) 106/54 (07/24 0411) SpO2:  [97 %-100 %] 97 % (07/24 0411)  Intake/Output from previous day:  Intake/Output Summary (Last 24 hours) at 10/11/2023 0918 Last data filed at 10/11/2023 0600 Gross per 24 hour  Intake 960 ml  Output 300 ml  Net 660 ml     Intake/Output this shift: No intake/output data recorded.  Labs: Recent Labs    10/10/23 0339 10/11/23 0338  HGB 9.9* 10.2*   Recent Labs    10/10/23 0339 10/11/23 0338  WBC 17.2* 16.1*  RBC 3.70* 3.78*  HCT 30.0* 30.3*  PLT 270 278   Recent Labs    10/10/23 0339  NA 136  K 3.4*  CL 101  CO2 26  BUN 21  CREATININE 1.21*  GLUCOSE 138*  CALCIUM  8.8*   No results for input(s): LABPT, INR in the last 72 hours.  Exam: General - Patient is Alert and Oriented Extremity - Neurologically intact Sensation intact distally Intact pulses distally Dorsiflexion/Plantar flexion intact Dressing - dressing C/D/I Motor Function - intact, moving foot and toes well on exam.   Past Medical History:  Diagnosis Date   Arthritis    BACK PAIN 02/02/2010   Qualifier: Diagnosis of  By: Antonetta MD, Margaret     Essential hypertension    Hyperlipidemia    Hypertension    [employer]reesia 07/05/2020   Hypothyroidism    Lymphocytic thyroiditis    Metabolic syndrome X 12/02/2013   NEVUS 05/12/2010   Qualifier: Diagnosis of  By: Antonetta MD, Margaret      Obesity    Prediabetes    Primary osteoarthritis, 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   TENDINITIS 02/02/2010   Qualifier: Diagnosis of  By: Antonetta MD, Margaret     Thyroid  disease    [employer]reesia 07/05/2020    Assessment/Plan: 2 Days Post-Op Procedure(s) (LRB): ARTHROPLASTY, HIP, TOTAL, ANTERIOR APPROACH (Right) Principal Problem:   S/P total right hip arthroplasty  Estimated body mass index is 22.75 kg/m as calculated from the following:   Height as of this encounter: 5' 5 (1.651 m).   Weight as of this encounter: 62 kg. Advance diet Up with therapy D/C IV fluids  DVT Prophylaxis - Aspirin  Weight bearing as tolerated.  Hgb stable at 10.2 I did replace her potassium today  I do feel she may be getting slightly confused with the pain medication, although using very little which she notes as well  Would recommend mostly tylenol , methocarbamol    I do not feel she needs HHPT at home, but did offer it and she wishes to hold off for now  Plan is to go Home after hospital stay. Plan for discharge today after meeting goals with therapy. Follow up in the office in 2 weeks.   Rosina Calin, PA-C Orthopedic Surgery [phone] 10/11/2023, 9:18 AM

## 2023-10-11 NOTE — Progress Notes (Signed)
 Physical Therapy Treatment Patient Details Name: Christina Carroll MRN: [id number] DOB: 19-Oct-1945 Today's Date: 10/11/2023   History of Present Illness Pt is 78 yo female admitted s/p R anterior THA on 10/09/23.  Pt with hx including but not limited to arthritis, HLD, HTN, hypothyroidism    PT Comments  Pt ambulated to/from bathroom, in hallway, and practiced safe stair technique.  Pt anticipates d/c today however would like to have second session.     If plan is discharge home, recommend the following: A little help with walking and/or transfers;A little help with bathing/dressing/bathroom;Assistance with cooking/housework;Help with stairs or ramp for entrance   Can travel by private vehicle        Equipment Recommendations  Rolling walker (2 wheels)    Recommendations for Other Services       Precautions / Restrictions Precautions Precautions: Fall Restrictions RLE Weight Bearing Per Provider Order: Weight bearing as tolerated     Mobility  Bed Mobility Overal bed mobility: Needs Assistance Bed Mobility: Supine to Sit     Supine to sit: Supervision     General bed mobility comments: cues for self assist    Transfers Overall transfer level: Needs assistance Equipment used: Rolling walker (2 wheels) Transfers: Sit to/from Stand Sit to Stand: Supervision           General transfer comment: ambulated into bathroom and used without assist prior to going into hallway    Ambulation/Gait Ambulation/Gait assistance: Supervision Gait Distance (Feet): 120 Feet Assistive device: Rolling walker (2 wheels) Gait Pattern/deviations: Decreased stride length, Decreased weight shift to right, Step-through pattern Gait velocity: decreased     General Gait Details: mildly antalgic gait but steady with RW, distance per pt tolerance   Stairs Stairs: Yes   Stair Management: Step to pattern, Forwards, Two rails Number of Stairs: 3 General stair comments: verbal cues  for safety and sequence   Wheelchair Mobility     Tilt Bed    Modified Rankin (Stroke Patients Only)       Balance                                            Communication Communication Communication: No apparent difficulties  Cognition Arousal: Alert Behavior During Therapy: WFL for tasks assessed/performed   PT - Cognitive impairments: No apparent impairments                         Following commands: Intact      Cueing    Exercises      General Comments General comments (skin integrity, edema, etc.): ice in place on R hip and LE's elevated after session      Pertinent Vitals/Pain Pain Assessment Pain Assessment: 0-10 Pain Score: 3  Pain Location: R hip Pain Descriptors / Indicators: Sore Pain Intervention(s): Repositioned, Monitored during session, Ice applied    Home Living Family/patient expects to be discharged to:: Private residence Living Arrangements: Alone Available Help at Discharge: Family;Available 24 hours/day Type of Home: House Home Access: Stairs to enter   [employer] of Steps: 2   Home Layout: One level Home Equipment: Shower seat;Grab bars - tub/shower;Cane - single point;BSC/3in1;Hand held shower head;Adaptive equipment Additional Comments: son will purchase a long handled sponge    Prior Function            PT Goals (current goals  can now be found in the care plan section) Progress towards PT goals: Progressing toward goals    Frequency    7X/week      PT Plan      Co-evaluation              AM-PAC PT 6 Clicks Mobility   Outcome Measure  Help needed turning from your back to your side while in a flat bed without using bedrails?: A Little Help needed moving from lying on your back to sitting on the side of a flat bed without using bedrails?: A Little Help needed moving to and from a bed to a chair (including a wheelchair)?: A Little Help needed standing up from a  chair using your arms (e.g., wheelchair or bedside chair)?: A Little Help needed to walk in hospital room?: A Little Help needed climbing 3-5 steps with a railing? : A Little 6 Click Score: 18    End of Session Equipment Utilized During Treatment: Gait belt Activity Tolerance: Patient tolerated treatment well Patient left: with call bell/phone within reach;in chair Nurse Communication: Mobility status PT Visit Diagnosis: Other abnormalities of gait and mobility (R26.89)     Time: 8990-8976 PT Time Calculation (min) (ACUTE ONLY): 14 min  Charges:    $Gait Training: 8-22 mins PT General Charges $$ ACUTE PT VISIT: 1 Visit                     Tari KLEIN, DPT Physical Therapist Acute Rehabilitation Services Office: [phone]    [name] 10/11/2023, 12:52 PM

## 2023-10-11 NOTE — Evaluation (Signed)
 Occupational Therapy Evaluation Patient Details Name: Christina Carroll MRN: [id number] DOB: [birth year] Today's Date: 10/11/2023   History of Present Illness   Pt is 78 yo female admitted s/p R anterior THA on 10/09/23.  Pt with hx including but not limited to arthritis, HLD, HTN, hypothyroidism     Clinical Impressions Patient evaluated by Occupational Therapy with no further acute OT needs identified. All education has been completed and the patient has no further questions. Patient reports son will purchase long handled sponge and reacher in already in home. Did well sinkside standing level and with teach back for basic BADL's and bathroom/household mobility with RW.  See below for any follow-up Occupational Therapy or equipment needs. OT is signing off. Thank you for this referral.      If plan is discharge home, recommend the following:   A little help with walking and/or transfers;Assistance with cooking/housework;Assist for transportation;Help with stairs or ramp for entrance     Functional Status Assessment   Patient has had a recent decline in their functional status and demonstrates the ability to make significant improvements in function in a reasonable and predictable amount of time.     Equipment Recommendations   None recommended by OT      Precautions/Restrictions   Precautions Precautions: Fall Restrictions Weight Bearing Restrictions Per Provider Order: No RLE Weight Bearing Per Provider Order: Weight bearing as tolerated     Mobility Bed Mobility Overal bed mobility:  (was up in recliner and remained)                  Transfers Overall transfer level: Needs assistance Equipment used: Rolling walker (2 wheels) Transfers: Sit to/from Stand, Bed to chair/wheelchair/BSC Sit to Stand: Supervision     Step pivot transfers: Supervision     General transfer comment: RW use to amb to and from bathroom and perform sink side standing       Balance Overall balance assessment: Needs assistance Sitting-balance support: No upper extremity supported Sitting balance-Leahy Scale: Good     Standing balance support: During functional activity, Single extremity supported Standing balance-Leahy Scale: Fair Standing balance comment: light sink side support with RW surround for grooming                           ADL either performed or assessed with clinical judgement   ADL Overall ADL's : Needs assistance/impaired Eating/Feeding: Independent   Grooming: Wash/dry hands;Wash/dry face;Oral care;Modified independent   Upper Body Bathing: Modified independent   Lower Body Bathing: Supervison/ safety Lower Body Bathing Details (indicate cue type and reason): OT instructed in use of LH sponge- patient reports her son will purchase Upper Body Dressing : Modified independent;Sitting   Lower Body Dressing: Supervision/safety;Sit to/from stand Lower Body Dressing Details (indicate cue type and reason): able to reach to R LE for sock management this session, if TEDs still rec, will need assist to don R LE Toilet Transfer: Supervision/safety;Regular Toilet;Rolling walker (2 wheels)   Toileting- Clothing Manipulation and Hygiene: Modified independent   Tub/ Shower Transfer: Therapist, sports Details (indicate cue type and reason): recommending a family member be present for 1st showers for safety with + teach back Functional mobility during ADLs: Supervision/safety;Rolling walker (2 wheels) General ADL Comments: stood functional time sink side with RW for grooming routine, educated on reacher and long handled sponge use with + teach back     Vision Baseline Vision/History: 0 No visual deficits;1 Wears glasses  Ability to See in Adequate Light: 0 Adequate Patient Visual Report: No change from baseline              Pertinent Vitals/Pain Pain Assessment Pain Assessment: 0-10 Pain Score: 2  Pain  Location: R hip Pain Descriptors / Indicators: Sore Pain Intervention(s): Monitored during session, Premedicated before session, Repositioned, Ice applied     Extremity/Trunk Assessment Upper Extremity Assessment Upper Extremity Assessment: Overall WFL for tasks assessed   Lower Extremity Assessment Lower Extremity Assessment: Defer to PT evaluation   Cervical / Trunk Assessment Cervical / Trunk Assessment: Normal   Communication Communication Communication: No apparent difficulties   Cognition Arousal: Alert Behavior During Therapy: WFL for tasks assessed/performed Cognition: No apparent impairments                               Following commands: Intact       Cueing  General Comments   Cueing Techniques: Verbal cues  ice in place on R hip and LE's elevated after session           Home Living Family/patient expects to be discharged to:: Private residence Living Arrangements: Alone Available Help at Discharge: Family;Available 24 hours/day Type of Home: House Home Access: Stairs to enter [employer] of Steps: 2   Home Layout: One level     Bathroom Shower/Tub: Walk-in Pensions consultant: Standard Bathroom Accessibility: Yes How Accessible: Accessible via walker Home Equipment: Shower seat;Grab bars - tub/shower;Cane - single point;BSC/3in1;Hand held shower head;Adaptive equipment Adaptive Equipment: Reacher Additional Comments: son will purchase a long handled sponge      Prior Functioning/Environment Prior Level of Function : Independent/Modified Independent;Driving             Mobility Comments: could ambulate in community without ad ADLs Comments: independent adls and iadls            OT Goals(Current goals can be found in the care plan section)   Acute Rehab OT Goals Patient Stated Goal: to go home OT Goal Formulation: With patient   AM-PAC OT 6 Clicks Daily Activity     Outcome Measure Help  from another person eating meals?: None Help from another person taking care of personal grooming?: None Help from another person toileting, which includes using toliet, bedpan, or urinal?: None Help from another person bathing (including washing, rinsing, drying)?: A Little Help from another person to put on and taking off regular upper body clothing?: None Help from another person to put on and taking off regular lower body clothing?: A Little 6 Click Score: 22   End of Session Equipment Utilized During Treatment: Gait belt;Rolling walker (2 wheels) Nurse Communication: Mobility status  Activity Tolerance: Patient tolerated treatment well Patient left: in chair;with call bell/phone within reach;with chair alarm set                   Time: 1038-1100 OT Time Calculation (min): 22 min Charges:  OT General Charges $OT Visit: 1 Visit OT Evaluation $OT Eval Low Complexity: 1 Low  Emilija Bohman OT/L Acute Rehabilitation Department  [phone] 10/11/2023, 11:13 AM

## 2023-10-12 ENCOUNTER — Encounter (HOSPITAL_COMMUNITY): Payer: Self-pay | Admitting: Orthopedic Surgery

## 2023-10-16 NOTE — Discharge Summary (Signed)
 Patient ID: Christina Carroll MRN: [id number] DOB/AGE: [birth year] 78 y.o.  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 female who was admitted 10/09/2023 for operative treatment ofS/P total right hip arthroplasty. Patient has severe unremitting pain that affects sleep, daily activities, and work/hobbies. After pre-op clearance the patient was taken to the operating room on 10/09/2023 and underwent  Procedure(s): ARTHROPLASTY, HIP, TOTAL, ANTERIOR APPROACH.    Patient was given perioperative antibiotics:  Anti-infectives (From admission, onward)    Start     Dose/Rate Route Frequency Ordered Stop   10/09/23 1430  ceFAZolin  (ANCEF ) IVPB 2g/100 mL premix        2 g 200 mL/hr over 30 Minutes Intravenous Every 6 hours 10/09/23 1259 10/09/23 2110   10/09/23 0645  ceFAZolin  (ANCEF ) IVPB 2g/100 mL premix        2 g 200 mL/hr over 30 Minutes Intravenous On call to O.R. 10/09/23 0631 10/09/23 0850        Patient was given sequential compression devices, early ambulation, and chemoprophylaxis to prevent  DVT. Patient worked with PT and was meeting their goals regarding safe ambulation and transfers.  Patient benefited maximally from hospital stay and there were no complications.    Recent vital signs: No data found.   Recent laboratory studies: No results for input(s): WBC, HGB, HCT, PLT, NA, K, CL, CO2, BUN, CREATININE, GLUCOSE, INR, CALCIUM  in the last 72 hours.  Invalid input(s): PT, 2   Discharge Medications:   Allergies as of 10/11/2023   No Known Allergies      Medication List     TAKE these medications    acetaminophen  500 MG tablet Commonly known as: TYLENOL  Take 2 tablets (1,000 mg total) by mouth every 6 (six) hours.   Aspirin  Low Dose 81 MG chewable tablet Generic drug: aspirin  Chew 1 tablet (81 mg total) by mouth 2 (two) times daily for 28 days.   BLACK CURRANT SEED OIL PO Take 1 capsule by mouth daily.   CINNAMON PO Take 1 capsule by mouth daily.   LINIMENTS EX Apply 1 Application topically daily as needed (pain). Horse Liniment   methocarbamol  500 MG tablet Commonly known as: ROBAXIN  Take 1 tablet (500 mg total) by mouth every 6 (six) hours as needed for muscle spasms (muscle/thigh pain).   NIFEdipine  60 MG 24 hr tablet Commonly known as: PROCARDIA  XL/NIFEDICAL XL TAKE 1 TABLET BY MOUTH DAILY   polyethylene glycol powder 17 GM/SCOOP powder Commonly known as: GLYCOLAX /MIRALAX  Take 17 grams by mouth 2 (two) times daily.  rosuvastatin  20 MG tablet Commonly known as: Crestor  Take 1 tablet (20 mg total) by mouth daily.   traMADol  50 MG tablet Commonly known as: ULTRAM  Take 1 tablet (50 mg total) by mouth every 6 (six) hours as needed for severe pain (pain score 7-10).   triamterene -hydrochlorothiazide  37.5-25 MG tablet Commonly known as: MAXZIDE -25 TAKE 1 TABLET BY MOUTH DAILY   Unithroid  50 MCG tablet Generic drug: levothyroxine  Take 1 tablet (50 mcg total) by mouth daily before breakfast.   VITAMIN C PO Take  1 tablet by mouth daily.   Vitamin D  50 MCG (2000 UT) Caps Take 1 capsule (2,000 Units total) by mouth daily.               Discharge Care Instructions  (From admission, onward)           Start     Ordered   10/11/23 0000  Change dressing       Comments: Maintain surgical dressing until follow up in the clinic. If the edges start to pull up, may reinforce with tape. If the dressing is no longer working, may remove and cover with gauze and tape, but must keep the area dry and clean.  Call with any questions or concerns.   10/11/23 1345            Diagnostic Studies: DG Pelvis Portable Result Date: 10/09/2023 CLINICAL DATA:  Status post right hip arthroplasty. EXAM: PORTABLE PELVIS 1-2 VIEWS COMPARISON:  None Available. FINDINGS: Right hip arthroplasty in expected alignment. No periprosthetic lucency or fracture. Recent postsurgical change includes air and edema in the soft tissues. IMPRESSION: Right hip arthroplasty without immediate postoperative complication. Electronically Signed   By: Andrea Gasman M.D.   On: 10/09/2023 11:42   DG HIP UNILAT WITH PELVIS 1V RIGHT Result Date: 10/09/2023 CLINICAL DATA:  Elective surgery. EXAM: DG HIP (WITH OR WITHOUT PELVIS) 1V RIGHT COMPARISON:  None Available. FINDINGS: Ten fluoroscopic spot views of the pelvis and right hip obtained in the operating room. Sequential images during hip arthroplasty. Fluoroscopy time 12 seconds. Dose 1.3 mGy. IMPRESSION: Intraoperative fluoroscopy during right hip arthroplasty. Electronically Signed   By: Andrea Gasman M.D.   On: 10/09/2023 11:41   DG C-Arm 1-60 Min-No Report Result Date: 10/09/2023 Fluoroscopy was utilized by the requesting physician.  No radiographic interpretation.   DG Chest 2 View Result Date: 09/26/2023 CLINICAL DATA:  pre op, HTN EXAM: CHEST - 2 VIEW COMPARISON:  April 20, 2015 FINDINGS: No focal airspace consolidation, pleural effusion, or pneumothorax. No cardiomegaly.No  acute fracture or destructive lesion. Multilevel degenerative disc disease of the spine. IMPRESSION: No acute cardiopulmonary abnormality. Electronically Signed   By: Rogelia Myers M.D.   On: 09/26/2023 17:25    Disposition: Discharge disposition: 01-Home or Self Care       Discharge Instructions     Call MD / Call 911   Complete by: As directed    If you experience chest pain or shortness of breath, CALL 911 and be transported to the hospital emergency room.  If you develope a fever above 101 F, pus (white drainage) or increased drainage or redness at the wound, or calf pain, call your surgeon's office.   Change dressing   Complete by: As directed    Maintain surgical dressing until follow up in the clinic. If the edges start to pull up, may reinforce with tape. If the dressing is no longer working, may remove and cover with gauze and tape, but must keep  the area dry and clean.  Call with any questions or concerns.   Constipation Prevention   Complete by: As directed    Drink plenty of fluids.  Prune juice may be helpful.  You may use a stool softener, such as Colace (over the counter) 100 mg twice a day.  Use MiraLax  (over the counter) for constipation as needed.   Diet - low sodium heart healthy   Complete by: As directed    Increase activity slowly as tolerated   Complete by: As directed    Weight bearing as tolerated with assist device (walker, cane, etc) as directed, use it as long as suggested by your surgeon or therapist, typically at least 4-6 weeks.   Post-operative opioid taper instructions:   Complete by: As directed    POST-OPERATIVE OPIOID TAPER INSTRUCTIONS: It is important to wean off of your opioid medication as soon as possible. If you do not need pain medication after your surgery it is ok to stop day one. Opioids include: Codeine, Hydrocodone(Norco, Vicodin), Oxycodone (Percocet, oxycontin ) and hydromorphone  amongst others.  Long term and even short term use of  opiods can cause: Increased pain response Dependence Constipation Depression Respiratory depression And more.  Withdrawal symptoms can include Flu like symptoms Nausea, vomiting And more Techniques to manage these symptoms Hydrate well Eat regular healthy meals Stay active Use relaxation techniques(deep breathing, meditating, yoga) Do Not substitute Alcohol to help with tapering If you have been on opioids for less than two weeks and do not have pain than it is ok to stop all together.  Plan to wean off of opioids This plan should start within one week post op of your joint replacement. Maintain the same interval or time between taking each dose and first decrease the dose.  Cut the total daily intake of opioids by one tablet each day Next start to increase the time between doses. The last dose that should be eliminated is the evening dose.      TED hose   Complete by: As directed    Use stockings (TED hose) for 2 weeks on both leg(s).  You may remove them at night for sleeping.        Follow-up Information     Ernie Cough, MD. Schedule an appointment as soon as possible for a visit in 2 week(s).   Specialty: Orthopedic Surgery Contact information: [address] [place] [address] [phone]                  Signed: Rosina JONELLE Calin 10/16/2023, 1:23 PM

## 2023-11-15 ENCOUNTER — Encounter: Payer: Self-pay | Admitting: Neurology

## 2023-11-15 ENCOUNTER — Ambulatory Visit: Admitting: Neurology

## 2023-11-15 VITALS — BP 132/72 | HR 92 | Ht 66.0 in | Wt 128.0 lb

## 2023-11-15 DIAGNOSIS — G3184 Mild cognitive impairment, so stated: Secondary | ICD-10-CM

## 2023-11-15 MED ORDER — DONEPEZIL HCL 5 MG PO TABS: 5.0000 mg | ORAL_TABLET | Freq: Every day | ORAL | 4 refills | Status: DC

## 2023-11-15 NOTE — Patient Instructions (Addendum)
 Start Aricept  5 mg nightly, side effect include dizziness, diarrhea and vivid dreams  Continue your other medications  Continue to follow up with PCP  Return as needed    There are well-accepted and sensible ways to reduce risk for Alzheimers disease and other degenerative brain disorders .  Exercise Daily Walk A daily 20 minute walk should be part of your routine. Disease related apathy can be a significant roadblock to exercise and the only way to overcome this is to make it a daily routine and perhaps have a reward at the end (something your loved one loves to eat or drink perhaps) or a personal trainer coming to the home can also be very useful. Most importantly, the patient is much more likely to exercise if the caregiver / spouse does it with him/her. In general a structured, repetitive schedule is best.  General Health: Any diseases which effect your body will effect your brain such as a pneumonia, urinary infection, blood clot, heart attack or stroke. Keep contact with your primary care doctor for regular follow ups.  Sleep. A good nights sleep is healthy for the brain. Seven hours is recommended. If you have insomnia or poor sleep habits we can give you some instructions. If you have sleep apnea wear your mask.  Diet: Eating a heart healthy diet is also a good idea; fish and poultry instead of red meat, nuts (mostly non-peanuts), vegetables, fruits, olive oil or canola oil (instead of butter), minimal salt (use other spices to flavor foods), whole grain rice, bread, cereal and pasta and wine in moderation.Research is now showing that the MIND diet, which is a combination of The Mediterranean diet and the DASH diet, is beneficial for cognitive processing and longevity. Information about this diet can be found in The MIND Diet, a book by Annitta Feeling, MS, RDN, and online at [employer]  Finances, Power of [address] and Advance Directives: You should consider  putting legal safeguards in place with regard to financial and medical decision making. While the spouse always has power of attorney for medical and financial issues in the absence of any form, you should consider what you want in case the spouse / caregiver is no longer around or capable of making decisions.

## 2023-11-15 NOTE — Progress Notes (Signed)
 [facility]  PATIENT: Christina Carroll DOB: [birth year]  REQUESTING CLINICIAN: Antonetta Rollene BRAVO, MD HISTORY FROM: Patient/Son/Chart review  REASON FOR VISIT: Memory loss    HISTORICAL  CHIEF COMPLAINT:  Chief Complaint  Patient presents with   New Patient (Initial Visit)    Rm 12, with son Damon, NP for memory concerns    HISTORY OF PRESENT ILLNESS:  Discussed the use of AI scribe software for clinical note transcription with the patient, who gave verbal consent to proceed.  Christina Carroll is a 78 year old female with history of hypertension, hyperlipidemia, hypothyroidism, recent hip surgery who presents with memory issues for evaluation. She was referred by her primary care doctor for evaluation of memory issues.  She has been experiencing memory problems for years, with a recent worsening. She can write songs or poems but struggles to remember things she said the previous day. Her son notes frequent forgetfulness, requiring reminders for the same conversation within a day. They also reports that that she is repetitive and asks the same questions multiple times during the same conversation.   Her son and daughter-in-law have observed memory lapses and inappropriate spending due to her fixed income, leading to financial issues. Despite these challenges, she lives alone, cooks for herself, and manages her bills with auto-pay. No issues with cooking, shopping, cleaning, or driving. She has not experienced getting lost or having accidents while driving.  She recently underwent hip replacement surgery, which has affected her sleep due to pain. No history of sleep apnea, stroke, seizures, or traumatic brain injury. There is a significant family history of dementia on both sides, with her mother and aunt having had memory problems.  No difficulty with using electronics or remembering family members' names. Her son mentions that she sometimes feels  depressed or frustrated when things do not come together quickly.    TBI:   No past history of TBI Stroke:   no past history of stroke Seizures:   no past history of seizures Sleep:   no history of sleep apnea.   Mood:   patient denies anxiety and depression Family history of Dementia: Mother, Paternal aunt  Denies  Functional status: independent in all ADLs and IADLs Patient lives with alone. Cooking: yes Cleaning: yes Shopping: yes Bathing: no issues Toileting: no issues Driving: yes Bills: yes Medications: no issues Ever left the stove on by accident?: denies Forget how to use items around the house?: denies Getting lost going to familiar places?: denies Forgetting loved ones names?: denies Word finding difficulty? denies Sleep: good    OTHER MEDICAL CONDITIONS: Hypothyroidism, hypertension, hyperlipidemia, recent hip surgery    REVIEW OF SYSTEMS: Full 14 system review of systems performed and negative with exception of: As noted in the HPI   ALLERGIES: No Known Allergies  HOME MEDICATIONS: Outpatient Medications Prior to Visit  Medication Sig Dispense Refill   acetaminophen  (TYLENOL ) 500 MG tablet Take 2 tablets (1,000 mg total) by mouth every 6 (six) hours.     Ascorbic Acid (VITAMIN C PO) Take 1 tablet by mouth daily.     BLACK CURRANT SEED OIL PO Take 1 capsule by mouth daily.     Cholecalciferol (VITAMIN D ) 50 MCG (2000 UT) CAPS Take 1 capsule (2,000 Units total) by mouth daily. 100 capsule 3   CINNAMON PO Take 1 capsule by mouth daily.     LINIMENTS EX Apply 1 Application topically daily as needed (pain). Horse Liniment     methocarbamol  (ROBAXIN )  500 MG tablet Take 1 tablet (500 mg total) by mouth every 6 (six) hours as needed for muscle spasms (muscle/thigh pain). 40 tablet 0   NIFEdipine  (PROCARDIA  XL/NIFEDICAL XL) 60 MG 24 hr tablet TAKE 1 TABLET BY MOUTH DAILY 90 tablet 3   polyethylene glycol powder (GLYCOLAX /MIRALAX ) 17 GM/SCOOP powder Take 17 grams by  mouth 2 (two) times daily. 238 g 0   rosuvastatin  (CRESTOR ) 20 MG tablet Take 1 tablet (20 mg total) by mouth daily. 90 tablet 3   traMADol  (ULTRAM ) 50 MG tablet Take 1 tablet (50 mg total) by mouth every 6 (six) hours as needed for severe pain (pain score 7-10). 30 tablet 0   triamterene -hydrochlorothiazide  (MAXZIDE -25) 37.5-25 MG tablet TAKE 1 TABLET BY MOUTH DAILY 90 tablet 3   UNITHROID  50 MCG tablet Take 1 tablet (50 mcg total) by mouth daily before breakfast. 90 tablet 1   No facility-administered medications prior to visit.    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   TENDINITIS 02/02/2010   Qualifier: Diagnosis of  By: Antonetta MD, Margaret     Thyroid  disease    Phreesia 07/05/2020    PAST SURGICAL HISTORY: Past Surgical History:  Procedure Laterality Date   Bilateral foot surgery bone removed  2000   COLONOSCOPY  11/30/2011   Procedure: COLONOSCOPY;  Surgeon: Claudis RAYMOND Rivet, MD;  Location: [facility];  Service: Endoscopy;  Laterality: N/A;  830   COLONOSCOPY WITH PROPOFOL  N/A 04/04/2022   Procedure: COLONOSCOPY WITH PROPOFOL ;  Surgeon: Eartha Angelia Sieving, MD;  Location: [facility];  Service: Gastroenterology;  Laterality: N/A;  1:45pm, asa 3   Lumpectomy removed from thoracic spine area  1999   POLYPECTOMY  04/04/2022   Procedure: POLYPECTOMY INTESTINAL;  Surgeon: Eartha Angelia Sieving, MD;  Location: [facility];  Service: Gastroenterology;;   TOTAL HIP ARTHROPLASTY Right 10/09/2023   Procedure: ARTHROPLASTY, HIP, TOTAL, ANTERIOR  APPROACH;  Surgeon: Ernie Cough, MD;  Location: [facility];  Service: Orthopedics;  Laterality: Right;    FAMILY HISTORY: Family History  Problem Relation Age of Onset   Stroke Mother    Diabetes Mother    Hypertension Mother    Coronary artery disease Father    Arthritis Other    Cancer Other    Breast cancer Maternal Aunt    Hypertension Son     SOCIAL HISTORY: Social History   Socioeconomic History   Marital status: Divorced    Spouse name: Not on file   Number of children: 1   Years of education: Not on file   Highest education level: Bachelor's degree (e.g., BA, AB, BS)  Occupational History   Occupation: Employed   Tobacco Use   Smoking status: Never   Smokeless tobacco: Never  Vaping Use   Vaping status: Never Used  Substance and Sexual Activity   Alcohol use: No    Alcohol/week: 0.0 standard drinks of alcohol   Drug use: No   Sexual activity: Not Currently  Other Topics Concern  Not on file  Social History Narrative   Right handed   Caffeine- none   Lives alone   Social Drivers of Health   Financial Resource Strain: Low Risk  (07/02/2022)   Overall Financial Resource Strain (CARDIA)    Difficulty of Paying Living Expenses: Not hard at all  Food Insecurity: No Food Insecurity (10/09/2023)   Hunger Vital Sign    Worried About Running Out of Food in the Last Year: Never true    Ran Out of Food in the Last Year: Never true  Transportation Needs: No Transportation Needs (10/09/2023)   PRAPARE - Administrator, Civil Service (Medical): No    Lack of Transportation (Non-Medical): No  Physical Activity: Unknown (07/02/2022)   Exercise Vital Sign    Days of Exercise per Week: Patient declined    Minutes of Exercise per Session: Not on file  Stress: No Stress Concern Present (07/02/2022)   [employer] of Occupational Health - Occupational Stress Questionnaire    Feeling of Stress : Only a little  Social Connections: Moderately Integrated  (10/09/2023)   Social Connection and Isolation Panel    Frequency of Communication with Friends and Family: More than three times a week    Frequency of Social Gatherings with Friends and Family: More than three times a week    Attends Religious Services: More than 4 times per year    Active Member of [employer] or Organizations: Yes    Attends Banker Meetings: More than 4 times per year    Marital Status: Divorced  Intimate Partner Violence: Not At Risk (10/09/2023)   Humiliation, Afraid, Rape, and Kick questionnaire    Fear of Current or Ex-Partner: No    Emotionally Abused: No    Physically Abused: No    Sexually Abused: No    PHYSICAL EXAM  GENERAL EXAM/CONSTITUTIONAL: Vitals:  Vitals:   11/15/23 1027  BP: 132/72  Pulse: 92  SpO2: 96%  Weight: 128 lb (58.1 kg)  Height: 5' 6 (1.676 m)   Body mass index is 20.66 kg/m. Wt Readings from Last 3 Encounters:  11/15/23 128 lb (58.1 kg)  10/09/23 136 lb 11 oz (62 kg)  09/28/23 138 lb (62.6 kg)   Patient is in no distress; well developed, nourished and groomed; neck is supple  MUSCULOSKELETAL: Gait, strength, tone, movements noted in Neurologic exam below  NEUROLOGIC: MENTAL STATUS:      No data to display            11/15/2023   10:31 AM  Montreal Cognitive Assessment   Visuospatial/ Executive (0/5) 4  Naming (0/3) 2  Attention: Read list of digits (0/2) 2  Attention: Read list of letters (0/1) 0  Attention: Serial 7 subtraction starting at 100 (0/3) 2  Language: Repeat phrase (0/2) 2  Language : Fluency (0/1) 1  Abstraction (0/2) 2  Delayed Recall (0/5) 0  Orientation (0/6) 6  Total 21    awake, alert, oriented to person, place and time normal attention and concentration language fluent, comprehension intact, naming intact fund of knowledge appropriate  CRANIAL NERVE:  2nd, 3rd, 4th, 6th- visual fields full to confrontation, extraocular muscles intact, no nystagmus 5th - facial sensation  symmetric 7th - facial strength symmetric 8th - hearing intact 9th - palate elevates symmetrically, uvula midline 11th - shoulder shrug symmetric 12th - tongue protrusion midline  MOTOR:  normal bulk and tone, full strength in the BUE, BLE  SENSORY:  normal and symmetric to  light touch  COORDINATION:  finger-nose-finger, fine finger movements normal  GAIT/STATION:  Antalgic gait    DIAGNOSTIC DATA (LABS, IMAGING, TESTING) - I reviewed patient records, labs, notes, testing and imaging myself where available.  Lab Results  Component Value Date   WBC 16.1 (H) 10/11/2023   HGB 10.2 (L) 10/11/2023   HCT 30.3 (L) 10/11/2023   MCV 80.2 10/11/2023   PLT 278 10/11/2023      Component Value Date/Time   NA 136 10/10/2023 0339   NA 138 07/23/2023 0908   K 3.4 (L) 10/10/2023 0339   CL 101 10/10/2023 0339   CO2 26 10/10/2023 0339   GLUCOSE 138 (H) 10/10/2023 0339   BUN 21 10/10/2023 0339   BUN 19 07/23/2023 0908   CREATININE 1.21 (H) 10/10/2023 0339   CREATININE 1.03 (H) 05/07/2019 0834   CALCIUM  8.8 (L) 10/10/2023 0339   PROT 7.1 07/23/2023 0908   ALBUMIN 4.3 07/23/2023 0908   AST 21 07/23/2023 0908   ALT 22 07/23/2023 0908   ALKPHOS 101 07/23/2023 0908   BILITOT 1.0 07/23/2023 0908   GFRNONAA 47 (L) 10/10/2023 0339   GFRNONAA 55 (L) 05/07/2019 0834   GFRAA 63 05/07/2019 0834   Lab Results  Component Value Date   CHOL 224 (H) 07/23/2023   HDL 83 07/23/2023   LDLCALC 129 (H) 07/23/2023   TRIG 68 07/23/2023   CHOLHDL 2.7 07/23/2023   Lab Results  Component Value Date   HGBA1C 5.8 (H) 07/23/2023   Lab Results  Component Value Date   VITAMINB12 627 08/27/2023   Lab Results  Component Value Date   TSH 2.750 06/04/2023    MRI Brain 08/28/2023 1.  No evidence of an acute intracranial abnormality. 2. Mild chronic small vessel ischemic changes within the cerebral white matter, slightly progressed since the MRI of 09/09/2012. 3. Prominent perivascular space  versus chronic lacunar infarct within the right thalamus, unchanged 4. Mild-to-moderate generalized cerebral atrophy, progressed. 5. Minor right maxillary sinus mucosal thickening   Carotid US  08/28/2023 No significant stenosis within the extracranial carotid arteries bilaterally.    ASSESSMENT AND PLAN  78 y.o. year old female with h history of hypertension, hyperlipidemia, hypothyroidism, recent hip surgery was presenting with memory loss.    Mild cognitive impairment Mild cognitive impairment with awareness of forgetfulness but maintaining independence in daily activities. Symptoms include short-term memory loss, requiring repetition of information, and occasional financial mismanagement. Family history of dementia on both maternal and paternal sides. Recent MRI and carotid ultrasound were unremarkable, and blood work including thyroid  and B12 levels were normal. MOCA score of 21 indicates mild cognitive impairment. Differential diagnosis includes Alzheimer's disease, vs. vascular dementia. Discussed the potential progression to dementia, particularly Alzheimer's disease, given family history and symptoms. Explained the role of biomarkers in predicting Alzheimer's disease and the potential for progression over time. - Order blood test for Alzheimer's disease biomarkers. - Start Aricept  5 mg at night. - Advise on potential side effects of Aricept , including diarrhea, dizziness, and vivid dreams. - Encourage physical exercise: at least 20 minutes, 5 days a week. - Recommend mental exercises: crossword puzzles, word search, math games, reading aloud, and writing. - Advise on maintaining a healthy diet and good sleep hygiene. - Discuss alternative medications if Aricept  is not tolerated, including rivastigmine and galantamine. - Arrange for home delivery of Aricept  through [facility]. - Advise to contact via MyChart for any issues or side effects.    1. Mild cognitive impairment     Patient  Instructions  Start Aricept  5 mg nightly, side effect include dizziness, diarrhea and vivid dreams  Continue your other medications  Continue to follow up with PCP  Return as needed    There are well-accepted and sensible ways to reduce risk for Alzheimers disease and other degenerative brain disorders .  Exercise Daily Walk A daily 20 minute walk should be part of your routine. Disease related apathy can be a significant roadblock to exercise and the only way to overcome this is to make it a daily routine and perhaps have a reward at the end (something your loved one loves to eat or drink perhaps) or a personal trainer coming to the home can also be very useful. Most importantly, the patient is much more likely to exercise if the caregiver / spouse does it with him/her. In general a structured, repetitive schedule is best.  General Health: Any diseases which effect your body will effect your brain such as a pneumonia, urinary infection, blood clot, heart attack or stroke. Keep contact with your primary care doctor for regular follow ups.  Sleep. A good nights sleep is healthy for the brain. Seven hours is recommended. If you have insomnia or poor sleep habits we can give you some instructions. If you have sleep apnea wear your mask.  Diet: Eating a heart healthy diet is also a good idea; fish and poultry instead of red meat, nuts (mostly non-peanuts), vegetables, fruits, olive oil or canola oil (instead of butter), minimal salt (use other spices to flavor foods), whole grain rice, bread, cereal and pasta and wine in moderation.Research is now showing that the MIND diet, which is a combination of The Mediterranean diet and the DASH diet, is beneficial for cognitive processing and longevity. Information about this diet can be found in The MIND Diet, a book by Annitta Feeling, MS, RDN, and online at [employer]Wild[employer].es  Finances, Power of [address] and Advance Directives: You  should consider putting legal safeguards in place with regard to financial and medical decision making. While the spouse always has power of attorney for medical and financial issues in the absence of any form, you should consider what you want in case the spouse / caregiver is no longer around or capable of making decisions.    Orders Placed This Encounter  Procedures   ATN PROFILE    Meds ordered this encounter  Medications   donepezil  (ARICEPT ) 5 MG tablet    Sig: Take 1 tablet (5 mg total) by mouth at bedtime.    Dispense:  90 tablet    Refill:  4    Return if symptoms worsen or fail to improve.  I personally spent a total of 65 minutes in the care of the patient today including preparing to see the patient, getting/reviewing separately obtained history, performing a medically appropriate exam/evaluation, counseling and educating, placing orders, documenting clinical information in the EHR, independently interpreting results, and communicating results.   Pastor Falling, MD 11/15/2023, 12:09 PM  [facility] [address], [address] [place], [place] [address] [phone]

## 2023-11-18 LAB — ATN PROFILE
A -- Beta-amyloid 42/40 Ratio: 0.101 — AB (ref >0.102)
Beta-amyloid 40: 262.51 pg/mL
Beta-amyloid 42: 26.52 pg/mL
N -- NfL, Plasma: 5.57 pg/mL (ref 0.00–6.04)
T -- p-tau181: 4.66 pg/mL — AB (ref 0.00–0.97)

## 2023-11-20 ENCOUNTER — Ambulatory Visit: Payer: Self-pay | Admitting: Neurology

## 2023-11-20 NOTE — Progress Notes (Signed)
 Please call and advise the patient that the recent Dementia labs showed presence of Alzheimer disease biomarker. Please inform patient that her memory loss is likely due to Alzheimer disease. Please remind patient to continue current medication, keep any upcoming appointments or tests and to call us  with any interim questions, concerns, problems or updates. Thanks,   Pastor Falling, MD

## 2023-11-30 ENCOUNTER — Ambulatory Visit: Admitting: Family Medicine

## 2023-12-04 ENCOUNTER — Emergency Department (HOSPITAL_COMMUNITY)

## 2023-12-04 ENCOUNTER — Ambulatory Visit (INDEPENDENT_AMBULATORY_CARE_PROVIDER_SITE_OTHER): Admitting: "Endocrinology

## 2023-12-04 ENCOUNTER — Observation Stay (HOSPITAL_COMMUNITY)
Admission: EM | Admit: 2023-12-04 | Discharge: 2023-12-05 | Disposition: A | Discharge status: Home / Self Care | Source: Ambulatory Visit | Attending: Family Medicine | Admitting: Family Medicine

## 2023-12-04 ENCOUNTER — Encounter (HOSPITAL_COMMUNITY): Payer: Self-pay | Admitting: Emergency Medicine

## 2023-12-04 ENCOUNTER — Encounter: Payer: Self-pay | Admitting: "Endocrinology

## 2023-12-04 ENCOUNTER — Other Ambulatory Visit: Payer: Self-pay

## 2023-12-04 VITALS — BP 82/60 | HR 52

## 2023-12-04 DIAGNOSIS — I10 Essential (primary) hypertension: Secondary | ICD-10-CM | POA: Insufficient documentation

## 2023-12-04 DIAGNOSIS — E871 Hypo-osmolality and hyponatremia: Secondary | ICD-10-CM | POA: Diagnosis not present

## 2023-12-04 DIAGNOSIS — E039 Hypothyroidism, unspecified: Secondary | ICD-10-CM | POA: Diagnosis not present

## 2023-12-04 DIAGNOSIS — E8729 Other acidosis: Secondary | ICD-10-CM | POA: Insufficient documentation

## 2023-12-04 DIAGNOSIS — R7401 Elevation of levels of liver transaminase levels: Secondary | ICD-10-CM | POA: Insufficient documentation

## 2023-12-04 DIAGNOSIS — E860 Dehydration: Principal | ICD-10-CM | POA: Insufficient documentation

## 2023-12-04 DIAGNOSIS — Z79899 Other long term (current) drug therapy: Secondary | ICD-10-CM | POA: Diagnosis not present

## 2023-12-04 DIAGNOSIS — G2581 Restless legs syndrome: Secondary | ICD-10-CM | POA: Diagnosis present

## 2023-12-04 DIAGNOSIS — E872 Acidosis, unspecified: Secondary | ICD-10-CM

## 2023-12-04 DIAGNOSIS — R630 Anorexia: Secondary | ICD-10-CM | POA: Diagnosis not present

## 2023-12-04 DIAGNOSIS — G309 Alzheimer's disease, unspecified: Secondary | ICD-10-CM | POA: Diagnosis not present

## 2023-12-04 DIAGNOSIS — E782 Mixed hyperlipidemia: Secondary | ICD-10-CM

## 2023-12-04 DIAGNOSIS — N179 Acute kidney failure, unspecified: Secondary | ICD-10-CM | POA: Diagnosis not present

## 2023-12-04 DIAGNOSIS — E876 Hypokalemia: Secondary | ICD-10-CM | POA: Diagnosis not present

## 2023-12-04 DIAGNOSIS — E063 Autoimmune thyroiditis: Secondary | ICD-10-CM

## 2023-12-04 DIAGNOSIS — R2689 Other abnormalities of gait and mobility: Secondary | ICD-10-CM | POA: Insufficient documentation

## 2023-12-04 DIAGNOSIS — F0390 Unspecified dementia without behavioral disturbance: Secondary | ICD-10-CM | POA: Diagnosis present

## 2023-12-04 DIAGNOSIS — R7303 Prediabetes: Secondary | ICD-10-CM | POA: Diagnosis not present

## 2023-12-04 DIAGNOSIS — I959 Hypotension, unspecified: Secondary | ICD-10-CM | POA: Diagnosis not present

## 2023-12-04 DIAGNOSIS — F0280 Dementia in other diseases classified elsewhere without behavioral disturbance: Secondary | ICD-10-CM | POA: Insufficient documentation

## 2023-12-04 DIAGNOSIS — E785 Hyperlipidemia, unspecified: Secondary | ICD-10-CM | POA: Diagnosis not present

## 2023-12-04 LAB — URINALYSIS, ROUTINE W REFLEX MICROSCOPIC
Bilirubin Urine: NEGATIVE
Glucose, UA: NEGATIVE mg/dL
Ketones, ur: 5 mg/dL — AB
Nitrite: NEGATIVE
Protein, ur: 30 mg/dL — AB
Specific Gravity, Urine: 1.011 (ref 1.005–1.030)
pH: 7 (ref 5.0–8.0)

## 2023-12-04 LAB — CBC WITH DIFFERENTIAL/PLATELET
Abs Immature Granulocytes: 0.04 K/uL (ref 0.00–0.07)
Basophils Absolute: 0 K/uL (ref 0.0–0.1)
Basophils Relative: 0 %
Eosinophils Absolute: 0 K/uL (ref 0.0–0.5)
Eosinophils Relative: 0 %
HCT: 47.2 % — ABNORMAL HIGH (ref 36.0–46.0)
Hemoglobin: 16.2 g/dL — ABNORMAL HIGH (ref 12.0–15.0)
Immature Granulocytes: 1 %
Lymphocytes Relative: 19 %
Lymphs Abs: 1.2 K/uL (ref 0.7–4.0)
MCH: 26.6 pg (ref 26.0–34.0)
MCHC: 34.3 g/dL (ref 30.0–36.0)
MCV: 77.6 fL — ABNORMAL LOW (ref 80.0–100.0)
Monocytes Absolute: 0.8 K/uL (ref 0.1–1.0)
Monocytes Relative: 12 %
Neutro Abs: 4.3 K/uL (ref 1.7–7.7)
Neutrophils Relative %: 68 %
Platelets: 358 K/uL (ref 150–400)
RBC: 6.08 MIL/uL — ABNORMAL HIGH (ref 3.87–5.11)
RDW: 13.3 % (ref 11.5–15.5)
WBC: 6.3 K/uL (ref 4.0–10.5)
nRBC: 0 % (ref 0.0–0.2)

## 2023-12-04 LAB — COMPREHENSIVE METABOLIC PANEL WITH GFR
ALT: 39 U/L (ref 0–44)
AST: 49 U/L — ABNORMAL HIGH (ref 15–41)
Albumin: 4.6 g/dL (ref 3.5–5.0)
Alkaline Phosphatase: 103 U/L (ref 38–126)
Anion gap: 24 — ABNORMAL HIGH (ref 5–15)
BUN: 27 mg/dL — ABNORMAL HIGH (ref 8–23)
CO2: 26 mmol/L (ref 22–32)
Calcium: 10.9 mg/dL — ABNORMAL HIGH (ref 8.9–10.3)
Chloride: 82 mmol/L — ABNORMAL LOW (ref 98–111)
Creatinine, Ser: 1.72 mg/dL — ABNORMAL HIGH (ref 0.44–1.00)
GFR, Estimated: 30 mL/min — ABNORMAL LOW (ref >60)
Glucose, Bld: 90 mg/dL (ref 70–99)
Potassium: 2.4 mmol/L — CL (ref 3.5–5.1)
Sodium: 132 mmol/L — ABNORMAL LOW (ref 135–145)
Total Bilirubin: 2.2 mg/dL — ABNORMAL HIGH (ref 0.0–1.2)
Total Protein: 8.4 g/dL — ABNORMAL HIGH (ref 6.5–8.1)

## 2023-12-04 LAB — TSH: TSH: 5.249 u[IU]/mL — ABNORMAL HIGH (ref 0.350–4.500)

## 2023-12-04 LAB — LACTIC ACID, PLASMA
Lactic Acid, Venous: 3.2 mmol/L (ref 0.5–1.9)
Lactic Acid, Venous: 1.9 mmol/L (ref 0.5–1.9)

## 2023-12-04 LAB — CBG MONITORING, ED: Glucose-Capillary: 61 mg/dL — ABNORMAL LOW (ref 70–99)

## 2023-12-04 LAB — MAGNESIUM: Magnesium: 3 mg/dL — ABNORMAL HIGH (ref 1.7–2.4)

## 2023-12-04 MED ORDER — SODIUM CHLORIDE 0.9 % IV BOLUS
1000.0000 mL | Freq: Once | INTRAVENOUS | Status: AC
  Administered 2023-12-04: 1000 mL via INTRAVENOUS

## 2023-12-04 MED ORDER — DONEPEZIL HCL 5 MG PO TABS
5.0000 mg | ORAL_TABLET | Freq: Every day | ORAL | Status: DC
  Administered 2023-12-04: 5 mg via ORAL
  Filled 2023-12-04: qty 1

## 2023-12-04 MED ORDER — POTASSIUM CHLORIDE 10 MEQ/100ML IV SOLN
10.0000 meq | INTRAVENOUS | Status: AC
  Administered 2023-12-04 (×3): 10 meq via INTRAVENOUS
  Filled 2023-12-04 (×3): qty 100

## 2023-12-04 MED ORDER — ACETAMINOPHEN 650 MG RE SUPP: 650.0000 mg | Freq: Four times a day (QID) | RECTAL | Status: DC | PRN

## 2023-12-04 MED ORDER — LEVOTHYROXINE SODIUM 50 MCG PO TABS
50.0000 ug | ORAL_TABLET | Freq: Every day | ORAL | Status: DC
Start: 2023-12-05 — End: 2023-12-05
  Administered 2023-12-05: 50 ug via ORAL
  Filled 2023-12-04: qty 1

## 2023-12-04 MED ORDER — IOHEXOL 300 MG/ML  SOLN
70.0000 mL | Freq: Once | INTRAMUSCULAR | Status: AC | PRN
  Administered 2023-12-04: 70 mL via INTRAVENOUS

## 2023-12-04 MED ORDER — SODIUM CHLORIDE 0.9 % IV SOLN: INTRAVENOUS | Status: DC

## 2023-12-04 MED ORDER — POTASSIUM CHLORIDE CRYS ER 20 MEQ PO TBCR
40.0000 meq | EXTENDED_RELEASE_TABLET | Freq: Once | ORAL | Status: AC
  Administered 2023-12-04: 40 meq via ORAL
  Filled 2023-12-04: qty 2

## 2023-12-04 MED ORDER — ENOXAPARIN SODIUM 30 MG/0.3ML IJ SOSY
30.0000 mg | PREFILLED_SYRINGE | INTRAMUSCULAR | Status: DC
  Administered 2023-12-04: 30 mg via SUBCUTANEOUS
  Filled 2023-12-04: qty 0.3

## 2023-12-04 MED ORDER — ACETAMINOPHEN 325 MG PO TABS: 650.0000 mg | ORAL_TABLET | Freq: Four times a day (QID) | ORAL | Status: DC | PRN

## 2023-12-04 NOTE — Progress Notes (Signed)
 12/04/2023, 12:11 PM   Endocrinology follow-up note  Subjective:    Patient ID: Christina Carroll, female    DOB: 1946/01/21, PCP Antonetta Rollene BRAVO, MD   Past Medical History:  Diagnosis Date   Arthritis    BACK PAIN 02/02/2010   Qualifier: Diagnosis of  By: Antonetta MD, Margaret     Essential hypertension    Hyperlipidemia    Hypertension    Phreesia 07/05/2020   Hypothyroidism    Lymphocytic thyroiditis    Metabolic syndrome X 12/02/2013   NEVUS 05/12/2010   Qualifier: Diagnosis of  By: Antonetta MD, Margaret     Obesity    Prediabetes    Primary osteoarthritis, 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   TENDINITIS 02/02/2010   Qualifier: Diagnosis of  By: Antonetta MD, Margaret     Thyroid  disease    Phreesia 07/05/2020   Past Surgical History:  Procedure Laterality Date   Bilateral foot surgery bone removed  2000   COLONOSCOPY  11/30/2011   Procedure: COLONOSCOPY;  Surgeon: Claudis RAYMOND Rivet, MD;  [facility];  Service: Endoscopy;  Laterality: N/A;  830   COLONOSCOPY WITH PROPOFOL  N/A 04/04/2022   Procedure: COLONOSCOPY WITH PROPOFOL ;  Surgeon: Eartha Angelia Sieving, MD;  Location: [facility];  Service: Gastroenterology;  Laterality: N/A;  1:45pm, asa 3   Lumpectomy removed from thoracic spine area  1999   POLYPECTOMY  04/04/2022   Procedure: POLYPECTOMY INTESTINAL;  Surgeon: Eartha Angelia Sieving, MD;  Location: [facility];  Service: Gastroenterology;;   TOTAL HIP ARTHROPLASTY Right 10/09/2023   Procedure: ARTHROPLASTY, HIP, TOTAL, ANTERIOR APPROACH;  Surgeon: Ernie Cough, MD;  Location: [facility];  Service: Orthopedics;  Laterality: Right;   Social History   Socioeconomic History   Marital status: Divorced    Spouse name: Not on file   Number of children: 1   Years of education: Not on file   Highest education level: Bachelor's degree (e.g., BA, AB, BS)  Occupational History   Occupation: Employed    Tobacco Use   Smoking status: Never   Smokeless tobacco: Never  Vaping Use   Vaping status: Never Used  Substance and Sexual Activity   Alcohol use: No    Alcohol/week: 0.0 standard drinks of alcohol   Drug use: No   Sexual activity: Not Currently  Other Topics Concern   Not on file  Social History Narrative   Right handed   Caffeine- none   Lives alone   Social Drivers of Health   Financial Resource Strain: Low Risk  (07/02/2022)   Overall Financial Resource Strain (CARDIA)    Difficulty of Paying Living Expenses: Not hard at all  Food Insecurity: No Food Insecurity (10/09/2023)   Hunger Vital Sign    Worried About Running Out of Food in the Last Year: Never true    Ran Out of Food in the Last Year: Never true  Transportation Needs: No Transportation Needs (10/09/2023)   PRAPARE - Administrator, Civil Service (Medical): No    Lack of Transportation (Non-Medical): No  Physical Activity: Unknown (07/02/2022)   Exercise Vital Sign    Days of Exercise per Week: Patient declined    Minutes of Exercise per Session: Not on file  Stress: No Stress Concern Present (07/02/2022)   [employer] of Occupational Health - Occupational Stress Questionnaire    Feeling of Stress : Only a little  Social Connections: Moderately Integrated (10/09/2023)   Social Connection and Isolation Panel    Frequency of Communication with Friends and Family: More than three times a week    Frequency of Social Gatherings with Friends and Family: More than three times a week    Attends Religious Services: More than 4 times per year    Active Member of [employer] or Organizations: Yes    Attends Engineer, structural: More than 4 times per year    Marital Status: Divorced   Family History  Problem Relation Age of Onset   Stroke Mother    Diabetes Mother    Hypertension Mother    Coronary artery disease Father    Arthritis Other    Cancer Other    Breast cancer Maternal Aunt     Hypertension Son    Outpatient Encounter Medications as of 12/04/2023  Medication Sig   acetaminophen  (TYLENOL ) 500 MG tablet Take 2 tablets (1,000 mg total) by mouth every 6 (six) hours.   Ascorbic Acid (VITAMIN C PO) Take 1 tablet by mouth daily.   BLACK CURRANT SEED OIL PO Take 1 capsule by mouth daily.   Cholecalciferol (VITAMIN D ) 50 MCG (2000 UT) CAPS Take 1 capsule (2,000 Units total) by mouth daily.   CINNAMON PO Take 1 capsule by mouth daily.   donepezil  (ARICEPT ) 5 MG tablet Take 1 tablet (5 mg total) by mouth at bedtime.   LINIMENTS EX Apply 1 Application topically daily as needed (pain). Horse Liniment   methocarbamol  (ROBAXIN ) 500 MG tablet Take 1 tablet (500 mg total) by mouth every 6 (six) hours as needed for muscle spasms (muscle/thigh pain).   NIFEdipine  (PROCARDIA  XL/NIFEDICAL XL) 60 MG 24 hr tablet TAKE 1 TABLET BY MOUTH DAILY   polyethylene glycol powder (GLYCOLAX /MIRALAX ) 17 GM/SCOOP powder Take 17 grams by mouth 2 (two) times daily.   rosuvastatin  (CRESTOR ) 20 MG tablet Take 1 tablet (20 mg total) by mouth daily.   traMADol  (ULTRAM ) 50 MG tablet Take 1 tablet (50 mg total) by mouth every 6 (six) hours as needed for severe pain (pain score 7-10).   triamterene -hydrochlorothiazide  (MAXZIDE -25) 37.5-25 MG tablet TAKE 1 TABLET BY MOUTH DAILY   UNITHROID  50 MCG tablet Take 1 tablet (50 mcg total) by mouth daily before breakfast.   No facility-administered encounter medications on file as of 12/04/2023.   ALLERGIES: No Known Allergies  VACCINATION STATUS: Immunization History  Administered Date(s) Administered   Fluad Quad(high Dose 65+) 11/11/2018, 01/05/2020, 12/09/2020, 11/30/2021, 11/25/2022   INFLUENZA, HIGH DOSE SEASONAL PF 12/21/2017   Influenza Split 02/21/2011, 01/31/2012   Influenza Whole 01/23/2007, 12/21/2008, 02/02/2010   Influenza,inj,Quad PF,6+ Mos 12/24/2013, 01/18/2015, 12/01/2015, 01/18/2017   MODERNA COVID-19 SARS-COV-2 PEDS BIVALENT BOOSTER 81yr-31yr  10/14/2021   Moderna Covid-19 Vaccine Bivalent Booster 71yrs & up 11/28/2022   Moderna SARS-COV2 Booster Vaccination 01/24/2020, 07/08/2020, 12/16/2020   Moderna Sars-Covid-2 Vaccination 04/24/2019, 05/23/2019   Pneumococcal Conjugate-13 09/22/2013   Pneumococcal Polysaccharide-23 04/03/2013   Td 04/10/2005   Tdap 06/29/2014   Zoster Recombinant(Shingrix ) 06/05/2017, 09/06/2017   Zoster, Live 04/19/2009    HPI KYRIANNA BARLETTA is 78 y.o. female who showed up before her appointment to clinic without doing her previsit labs.   On trash, she was found to be confused.  Her blood pressure was low at 82/60 with pulse rate of  52.  Clinically she was dehydrated, disoriented to place, person, time.  She was afebrile.  EMS was called for her to go to the ER for better evaluation. Patient with recent diagnosis of dementia, drove to clinic alone and she lives alone.  Even at baseline, she is not an optimal historian.  See notes from previous visits.   She was supposed to take her levothyroxine , for hypothyroidism due to Hashimoto's thyroiditis.    During the prior visit, she was advised to discontinue over-the-counter calcium  supplements due to mild to moderate hypercalcemia.  She cannot confirm to me if she has been consistent with her levothyroxine .   She has seen Dr. Christi in the past.    She denies prior history of thyroidectomy or thyroid  ablation.   She did not do her previsit labs this time.   Review of Systems  Constitutional: +mildly fluctuating body weight, no fatigue, no subjective hyperthermia, no subjective hypothermia   Objective:       12/04/2023   10:55 AM 12/04/2023   10:54 AM 12/04/2023    9:37 AM  Vitals with BMI  Height 5' 6    Weight 140 lbs    BMI 22.61    Systolic  121 82  Diastolic  79 60  Pulse  98 52    BP (!) 82/60 Comment: L arm with manual cuff, Dr.Khyrin Trevathan made aware  Pulse (!) 52   Wt Readings from Last 3 Encounters:  12/04/23 140 lb (63.5  kg)  11/15/23 128 lb (58.1 kg)  10/09/23 136 lb 11 oz (62 kg)    Physical Exam  Constitutional:  There is no height or weight on file to calculate BMI.,  not in acute distress, ,  +patient with significant cognitive deficit. Eyes: PERRLA, EOMI, no exophthalmos   Musculoskeletal: no gross deformities, strength intact in all four extremities, no peripheral edema Skin: moist, warm, no rashes Neurological: no tremor with outstretched hands, Deep tendon reflexes normal in bilateral lower extremities.  CMP ( most recent) CMP     Component Value Date/Time   NA 136 10/10/2023 0339   NA 138 07/23/2023 0908   K 3.4 (L) 10/10/2023 0339   CL 101 10/10/2023 0339   CO2 26 10/10/2023 0339   GLUCOSE 138 (H) 10/10/2023 0339   BUN 21 10/10/2023 0339   BUN 19 07/23/2023 0908   CREATININE 1.21 (H) 10/10/2023 0339   CREATININE 1.03 (H) 05/07/2019 0834   CALCIUM  8.8 (L) 10/10/2023 0339   PROT 7.1 07/23/2023 0908   ALBUMIN 4.3 07/23/2023 0908   AST 21 07/23/2023 0908   ALT 22 07/23/2023 0908   ALKPHOS 101 07/23/2023 0908   BILITOT 1.0 07/23/2023 0908   EGFR 54 (L) 07/23/2023 0908   GFRNONAA 47 (L) 10/10/2023 0339   GFRNONAA 55 (L) 05/07/2019 0834     Diabetic Labs (most recent): Lab Results  Component Value Date   HGBA1C 5.8 (H) 07/23/2023   HGBA1C 5.7 (H) 01/24/2023   HGBA1C 6.0 (H) 11/07/2022     Lipid Panel ( most recent) Lipid Panel     Component Value Date/Time   CHOL 224 (H) 07/23/2023 0908   TRIG 68 07/23/2023 0908   HDL 83 07/23/2023 0908   CHOLHDL 2.7 07/23/2023 0908   CHOLHDL 2.8 05/07/2019 0834   VLDL 12 08/11/2016 0818   LDLCALC 129 (H) 07/23/2023 0908   LDLCALC 115 (H) 05/07/2019 0834   LABVLDL 12 07/23/2023 0908      Lab Results  Component Value Date  TSH 2.750 06/04/2023   TSH 1.640 04/03/2023   TSH 2.500 11/07/2022   FREET4 1.01 06/04/2023   FREET4 1.33 04/03/2023     Bone density on Aug 16, 2021 [facility] Spine L1-L4 08/16/2021 73.8 Normal 2.3 1.456  g/cm2 13.2% Yes [facility] Spine L1-L4 06/13/2006 58.6 Normal 0.9 1.286 g/cm2 - -   DualFemur Total Left 08/16/2021 73.8 Normal -0.5 0.942 g/cm2 -4.9% Yes DualFemur Total Left 08/21/2018 70.8 Normal -0.1 0.991 g/cm2 1.7% -   DualFemur Total Mean 08/16/2021 73.8 Normal 0.0 1.004 g/cm2 -4.9% Yes DualFemur Total Mean 08/21/2018 70.8 - - 1.056 g/cm2 0.8% -    Left Forearm Radius 33% 08/16/2021 73.8 Normal -0.3 0.689 g/cm2 -3.1% -  ASSESSMENT: The BMD measured at Femur Total Left is 0.942 g/cm2 with a T-score of -0.5. This patient is considered normal according to World Health Organization [facility]) criteria. The scan quality is good. Compared with the prior study on 08/21/2018, the BMD of the total mean shows a statistically significant decrease.  Recent Results (from the past 2160 hours)  UA/M w/rflx Culture, Routine     Status: Abnormal   Collection Time: 09/26/23  4:30 PM   Specimen: Urine  Result Value Ref Range   Specific Gravity, UA 1.007 1.005 - 1.030   pH, UA 6.0 5.0 - 7.5   Color, UA Yellow Yellow   Appearance Ur Clear Clear   Leukocytes,UA 1+ (A) Negative   Protein,UA Negative Negative/Trace   Glucose, UA Negative Negative   Ketones, UA Negative Negative   RBC, UA Negative Negative   Bilirubin, UA Negative Negative   Urobilinogen, Ur 0.2 0.2 - 1.0 mg/dL   Nitrite, UA Negative Negative   Microscopic Examination See below:     Comment: Microscopic was indicated and was performed.   Urinalysis Reflex Comment     Comment: This specimen has reflexed to a Urine Culture.  Microscopic Examination     Status: None   Collection Time: 09/26/23  4:30 PM  Result Value Ref Range   WBC, UA 0-5 0 - 5 /hpf   RBC, Urine 0-2 0 - 2 /hpf   Epithelial Cells (non renal) 0-10 0 - 10 /hpf   Casts None seen None seen /lpf   Bacteria, UA None seen None seen/Few  Urine Culture, Reflex     Status: None   Collection Time: 09/26/23  4:30 PM  Result Value Ref Range   Urine Culture, Routine Final  report    Organism ID, Bacteria Comment     Comment: Mixed urogenital flora Less than 10,000 colonies/mL   Type and screen Order type and screen if day of surgery is less than 15 days from draw of preadmission visit or order morning of surgery if day of surgery is greater than 6 days from preadmission visit.     Status: None   Collection Time: 09/28/23  9:46 AM  Result Value Ref Range   ABO/RH(D) O POS    Antibody Screen NEG    Sample Expiration 10/12/2023,2359    Extend sample reason      NO TRANSFUSIONS OR PREGNANCY IN THE PAST 3 MONTHS Performed at [facility], [address], [place], [place] [address]   Basic metabolic panel per protocol     Status: Abnormal   Collection Time: 09/28/23  9:46 AM  Result Value Ref Range   Sodium 137 135 - 145 mmol/L   Potassium 3.7 3.5 - 5.1 mmol/L   Chloride 99 98 - 111 mmol/L   CO2  28 22 - 32 mmol/L   Glucose, Bld 94 70 - 99 mg/dL    Comment: Glucose reference range applies only to samples taken after fasting for at least 8 hours.   BUN 18 8 - 23 mg/dL   Creatinine, Ser 8.86 (H) 0.44 - 1.00 mg/dL   Calcium  9.9 8.9 - 10.3 mg/dL   GFR, Estimated 51 (L) >60 mL/min    Comment: (NOTE) Calculated using the CKD-EPI Creatinine Equation (2021)    Anion gap 10 5 - 15    Comment: Performed at [facility], [address], [place], [place] [address]  CBC per protocol     Status: None   Collection Time: 09/28/23  9:46 AM  Result Value Ref Range   WBC 5.9 4.0 - 10.5 K/uL   RBC 4.97 3.87 - 5.11 MIL/uL   Hemoglobin 13.2 12.0 - 15.0 g/dL   HCT 59.6 63.9 - 53.9 %   MCV 81.1 80.0 - 100.0 fL   MCH 26.6 26.0 - 34.0 pg   MCHC 32.8 30.0 - 36.0 g/dL   RDW 85.7 88.4 - 84.4 %   Platelets 363 150 - 400 K/uL   nRBC 0.0 0.0 - 0.2 %    Comment: Performed at [facility], [address] [place], [place] [address]  Surgical pcr screen     Status: None   Collection Time: 09/28/23 10:43 AM   Specimen:  Nasal Mucosa; Nasal Swab  Result Value Ref Range   MRSA, PCR NEGATIVE NEGATIVE   Staphylococcus aureus NEGATIVE NEGATIVE    Comment: (NOTE) The Xpert SA Assay (FDA approved for NASAL specimens in patients 23 years of age and older), is one component of a comprehensive surveillance program. It is not intended to diagnose infection nor to guide or monitor treatment. Performed at [facility], [address], [place], [place] [address]   ABO/Rh     Status: None   Collection Time: 10/09/23  7:10 AM  Result Value Ref Range   ABO/RH(D)      O POS Performed at [facility], [address] [place], [place] [address]   CBC     Status: Abnormal   Collection Time: 10/10/23  3:39 AM  Result Value Ref Range   WBC 17.2 (H) 4.0 - 10.5 K/uL   RBC 3.70 (L) 3.87 - 5.11 MIL/uL   Hemoglobin 9.9 (L) 12.0 - 15.0 g/dL   HCT 69.9 (L) 63.9 - 53.9 %   MCV 81.1 80.0 - 100.0 fL   MCH 26.8 26.0 - 34.0 pg   MCHC 33.0 30.0 - 36.0 g/dL   RDW 85.8 88.4 - 84.4 %   Platelets 270 150 - 400 K/uL   nRBC 0.0 0.0 - 0.2 %    Comment: Performed at [facility], [address] [place], [place] [address]  Basic metabolic panel     Status: Abnormal   Collection Time: 10/10/23  3:39 AM  Result Value Ref Range   Sodium 136 135 - 145 mmol/L   Potassium 3.4 (L) 3.5 - 5.1 mmol/L   Chloride 101 98 - 111 mmol/L   CO2 26 22 - 32 mmol/L   Glucose, Bld 138 (H) 70 - 99 mg/dL    Comment: Glucose reference range applies only to samples taken after fasting for at least 8 hours.   BUN 21 8 - 23 mg/dL   Creatinine, Ser 8.78 (H) 0.44 - 1.00 mg/dL   Calcium  8.8 (L) 8.9 -  10.3 mg/dL   GFR, Estimated 47 (L) >60 mL/min    Comment: (NOTE) Calculated using the CKD-EPI Creatinine Equation (2021)    Anion gap 9 5 - 15    Comment: Performed at [facility] [address] [place], [place] [address]  CBC     Status: Abnormal   Collection Time: 10/11/23  3:38  AM  Result Value Ref Range   WBC 16.1 (H) 4.0 - 10.5 K/uL   RBC 3.78 (L) 3.87 - 5.11 MIL/uL   Hemoglobin 10.2 (L) 12.0 - 15.0 g/dL   HCT 69.6 (L) 63.9 - 53.9 %   MCV 80.2 80.0 - 100.0 fL   MCH 27.0 26.0 - 34.0 pg   MCHC 33.7 30.0 - 36.0 g/dL   RDW 85.9 88.4 - 84.4 %   Platelets 278 150 - 400 K/uL   nRBC 0.0 0.0 - 0.2 %    Comment: Performed at [facility], [address], [place], [place] [address]  ATN PROFILE     Status: Abnormal   Collection Time: 11/15/23 11:21 AM  Result Value Ref Range   A -- Beta-amyloid 42/40 Ratio 0.101 (L) >0.102   Beta-amyloid 42 26.52 pg/mL   Beta-amyloid 40 262.51 pg/mL   T -- p-tau181 4.66 (H) 0.00 - 0.97 pg/mL   N -- NfL, Plasma 5.57 0.00 - 6.04 pg/mL   ATN SUMMARY Comment     Comment:                        A+ T+ N- A low beta-amyloid 42/40 and a high pTau181 concentration were observed. A normal NfL concentration was observed at this time. 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 Plasma findings may be less precise than CSF or PET. Additional assessments may be necessary. These tests are intended to be used in the context of clinical care.    Information: Comment     Comment: Beta-amyloid 42 and Beta-amyloid 40: Plasma beta-amyloid 1-42/1-40 ratios less than or equal to 0.102 suggest a higher probability of a patient being clinically diagnosed with Alzheimer's Disease (AD), while values above 0.102 suggest a lower probability of AD diagnosis. Precise plasma testing of Beta-amyloid 42 and Beta-amyloid 40 has demonstrated comparable effectiveness to traditional cerebrospinal fluid testing and amyloid positron emission tomography (PET) scans. When assessing the risk of AD pathology as the underlying cause for mild cognitive impairment (MCI) or dementia, it is important to consider various factors such as medical and family history, nutritional deficiency biomarkers, neuroimaging, and  physical, neurological, and neuropsychological examinations. These tests were developed and their performance characteristics determined by [facility]. They have not been cleared or approved by the Food and Drug Administration.                                                                    * METHODOLOGY: Beta-amyloid 42/40 Ratio: Sysmex Chemiluminescence Enzyme Immunoassay (CLEIA) NfL and p-tau181: Tests performed by Roche Diagnostics Electrochemiluminescence Immunoassay (ECLIA). Values obtained with different methods cannot be used interchangeable. These tests were developed and their performance characteristics determined by [facility]. They have not been cleared or approved by the Food and Drug Administration.                                                                    *  p-tau181 INFORMATION: For individual 30-68 years of age:  0.00-0.95 pg/mL Reference interval is based on a population of ostensibly healthy individuals aged 11 to 21 years For individual greater than 77 years of age:  0.00-0.97 pg/mL Results greater than the clinical cut-off of 0.97 pg/mL in patients greater than 48 years of age are correlated with Abeta amyloid pathology as determined by amyloid PET imaging.                                                                     * 1. Interpretation comments are based on a consensus between [facility] and the Internation Working Group recommendations for ATN panel interpretation published by Marinell dunker al 2018 and updated in [place] et al 2021.                                                                    *  Hampel H, Cummings J, Blennow K, Lia SHAUNNA Marinell [address] [place]. Developing the ATX(N) classification for use across the Alzheimer disease continiuum. Nat Rev Neurol. 2021 Sep;17(9):580-589.  Marinell CR Mickey Dolly DA, Blennow K, et al. A/T/N: An unbiased descriptive classification scheme for Alzheimer disease biomarkers. Neurology. 2016 Aug  2;87(5):539-547.      Assessment & Plan:   1. Hypothyroidism-Hashimoto's thyroiditis 2. Hypercalcemia  3.  Prediabetes   - Due to clinical situation of the patient being confused, dehydrated, acutely demented, she was deemed unstable.  EMS was called and patient was transported to ER for further  assessment and treatment. Based on the observation, patient should not be evaluated by social work.  She should not be driving due to her significant cognitive decline/dementia.  Living arrangement should be sought  for her social work.  - she has longstanding hypothyroidism on thyroid  hormone supplement, for which she is on regular thyroid  hormone supplement.  She we will continue Unithroid  50 mcg p.o. daily before breakfast.     - We discussed about the correct intake of her thyroid  hormone, on empty stomach at fasting, with water , separated by at least 30 minutes from breakfast and other medications,  and separated by more than 4 hours from calcium , iron, multivitamins, acid reflux medications (PPIs). -Patient is made aware of the fact that thyroid  hormone replacement is needed for life, dose to be adjusted by periodic monitoring of thyroid  function tests.   -Regarding her moderate hypercalcemia ; she should remain off of calcium  supplements.   Her recent labs showed PTH of 36 which should be adequate for normocalcemia. Her 24-hour urine calcium  was only 14 mg / 24 hours.  She is not a surgical candidate for hypercalcemia.  FHH is not ruled out.  She will be continued on expectant management.    I spent  26  minutes in the care of the patient today including review of labs from Thyroid  Function, CMP, and other relevant labs ; imaging/biopsy records (current and previous including abstractions from other facilities); face-to-face time discussing  her lab results and  symptoms, medications doses, her options of short and long term treatment based on the latest standards of care / guidelines;   and  documenting the encounter.  Christina Carroll  participated in the discussions, expressed understanding, and voiced agreement with the above plans.  All questions were answered to her satisfaction. she is encouraged to contact clinic should she have any questions or concerns prior to her return visit.   she is advised to maintain close follow up with Antonetta Rollene BRAVO, MD for primary care needs.  -  This note was partially dictated with voice recognition software. Similar sounding words can be transcribed inadequately or may not  be corrected upon review.   Follow up plan: No follow-ups on file.   Ranny Earl, MD [facility] [address] [place], [place] [address] Phone: [phone]  Fax: [phone]     12/04/2023, 12:11 PM  This note was partially dictated with voice recognition software. Similar sounding words can be transcribed inadequately or may not  be corrected upon review.

## 2023-12-04 NOTE — H&P (Signed)
 History and Physical    Patient: Christina Carroll FMW:[id number] DOB: 25-Sep-1945 DOA: 12/04/2023 DOS: the patient was seen and examined on 12/04/2023 PCP: Antonetta Rollene BRAVO, MD  Patient coming from: Home by way of Endocrinology office  Chief Complaint:  Chief Complaint  Patient presents with   Dementia   HPI: Christina Carroll is a 78 y.o. female with a history of HTN, HLD, hypothyroidism, and recent diagnosis of Alzheimer's dementia who presented to the ED due to confusion and hypotension. She had presented to an endocrionlogy office thinking she had an appointment today (friend states she didn't), and was found to be more confused and hypotensive, referred to the ED. The patient is s suboptimal historian, reports some time (maybe weeks) of poor oral intake. Denies nausea, vomiting, abdominal pain or other physical complaints. No fevers, has not felt unwell. Friend states she seemed more dehydrated. Has become increasingly confused over the past weeks-months despite starting aricept , family has begun process of admission to memory care, though the patient is resistant.   In the ED, she was hypotensive with elevated lactic acid, severe hypokalemia, AKI, hemoconcentration.    Review of Systems: As mentioned in the history of present illness. All other systems reviewed and are negative. Past Medical History:  Diagnosis Date   Arthritis    BACK PAIN 02/02/2010   Qualifier: Diagnosis of  By: Antonetta MD, Margaret     Essential hypertension    Hyperlipidemia    Hypertension    Phreesia 07/05/2020   Hypothyroidism    Lymphocytic thyroiditis    Metabolic syndrome X 12/02/2013   NEVUS 05/12/2010   Qualifier: Diagnosis of  By: Antonetta MD, Margaret     Obesity    Prediabetes    Primary osteoarthritis, 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   TENDINITIS 02/02/2010   Qualifier: Diagnosis of  By: Antonetta MD, Margaret     Thyroid  disease    Phreesia 07/05/2020   Past Surgical History:   Procedure Laterality Date   Bilateral foot surgery bone removed  2000   COLONOSCOPY  11/30/2011   Procedure: COLONOSCOPY;  Surgeon: Claudis RAYMOND Rivet, MD;  Location: [facility];  Service: Endoscopy;  Laterality: N/A;  830   COLONOSCOPY WITH PROPOFOL  N/A 04/04/2022   Procedure: COLONOSCOPY WITH PROPOFOL ;  Surgeon: Eartha Angelia Sieving, MD;  Location: [facility];  Service: Gastroenterology;  Laterality: N/A;  1:45pm, asa 3   Lumpectomy removed from thoracic spine area  1999   POLYPECTOMY  04/04/2022   Procedure: POLYPECTOMY INTESTINAL;  Surgeon: Eartha Angelia Sieving, MD;  Location: [facility];  Service: Gastroenterology;;   TOTAL HIP ARTHROPLASTY Right 10/09/2023   Procedure: ARTHROPLASTY, HIP, TOTAL, ANTERIOR APPROACH;  Surgeon: Ernie Cough, MD;  Location: [facility];  Service: Orthopedics;  Laterality: Right;   Social History:  reports that she has never smoked. She has never used smokeless tobacco. She reports that she does not drink alcohol and does not use drugs.  No Known Allergies  Family History  Problem Relation Age of Onset   Stroke Mother    Diabetes Mother    Hypertension Mother    Coronary artery disease Father    Arthritis Other    Cancer Other    Breast cancer Maternal Aunt    Hypertension Son     Prior to Admission medications   Medication Sig Start Date End Date Taking? Authorizing Provider  acetaminophen  (TYLENOL ) 500 MG tablet Take 2 tablets (1,000 mg total) by mouth every 6 (six) hours. 10/11/23  Patti Rosina SAUNDERS, PA-C  Ascorbic Acid (VITAMIN C PO) Take 1 tablet by mouth daily.    [provider]  BLACK CURRANT SEED OIL PO Take 1 capsule by mouth daily.    [provider]  Cholecalciferol (VITAMIN D ) 50 MCG (2000 UT) CAPS Take 1 capsule (2,000 Units total) by mouth daily. 04/17/23   Antonetta Rollene BRAVO, MD  CINNAMON PO Take 1 capsule by mouth daily.    [provider]  donepezil  (ARICEPT ) 5 MG tablet Take 1 tablet (5 mg  total) by mouth at bedtime. 11/15/23   Gregg Lek, MD  LINIMENTS EX Apply 1 Application topically daily as needed (pain). Horse Liniment    [provider]  methocarbamol  (ROBAXIN ) 500 MG tablet Take 1 tablet (500 mg total) by mouth every 6 (six) hours as needed for muscle spasms (muscle/thigh pain). 10/11/23   Patti Rosina SAUNDERS, PA-C  NIFEdipine  (PROCARDIA  XL/NIFEDICAL XL) 60 MG 24 hr tablet TAKE 1 TABLET BY MOUTH DAILY 02/09/23   Antonetta Rollene BRAVO, MD  polyethylene glycol powder (GLYCOLAX /MIRALAX ) 17 GM/SCOOP powder Take 17 grams by mouth 2 (two) times daily. 10/11/23   Patti Rosina SAUNDERS, PA-C  rosuvastatin  (CRESTOR ) 20 MG tablet Take 1 tablet (20 mg total) by mouth daily. 08/30/23   Antonetta Rollene BRAVO, MD  traMADol  (ULTRAM ) 50 MG tablet Take 1 tablet (50 mg total) by mouth every 6 (six) hours as needed for severe pain (pain score 7-10). 10/11/23   Patti Rosina SAUNDERS, PA-C  triamterene -hydrochlorothiazide  (MAXZIDE -25) 37.5-25 MG tablet TAKE 1 TABLET BY MOUTH DAILY 05/28/23   Antonetta Rollene BRAVO, MD  UNITHROID  50 MCG tablet Take 1 tablet (50 mcg total) by mouth daily before breakfast. 06/04/23   Lenis Ethelle ORN, MD    Physical Exam: Vitals:   12/04/23 1054 12/04/23 1055 12/04/23 1139  BP: 121/79    Pulse: 98    Resp: 16    Temp: 98 F (36.7 C)    TempSrc: Oral    SpO2: 100%  100%  Weight:  63.5 kg   Height:  5' 6 (1.676 m)   Gen: Older nontoxic female in no distress Pulm: Clear, nonlabored  CV: RRR, no MRG, no edema GI: Soft, NT, ND, +BS  Neuro: Alert and oriented to place, day of the week, guesses wrong month. No new focal deficits. Ext: Warm, no deformities Skin: No rashes, lesions or ulcers on visualized skin   Data Reviewed: Hgb 16.2, microcytic MCV Na 132, K 2.4, BUN 27, Cr 1.72, Ca 10.9 Lactic acid 3.2, AG 24, bicarb 26.  Cr 1.72 AST 49, Bili 2.2  CXR (personal review): No infiltrate ECG (personal interpretation): NSR with ventricular rate 68, Normal axis,  Normal intervals, QTc 448 msec     Assessment and Plan: AKI: Suspect hemodynamically mediated injury with poor oral intake, dehydration, and hypotension, compounded by use of diuretics (it's possible she's taking too much or too little).  - Rehydrate, hold diuretics, check UA w/micro, monitor UOP, avoid nephrotoxins and recheck renal function in AM.   Hypokalemia:  - Hold thiazide and supplement and additional 40mEq, monitor serially, continue telemetry. Mg is high at 3.0.   Hypercalcemia: Note no anemia or bone pain/fractures.  - Mild, will plan to recheck with ionized calcium  in AM after rehydration.   Hypovolemic hyponatremia:  - Given isotonic saline, will continue and allow regular diet for now.   Lactic acidemia: Suspect hypovolemic cause, BP has improved.  - Vital signs and clinical appearance appear stable for telemetry  floor admission at this time. Will trend lactic acid and continue reassessing  - No evidence of infection thus far on CXR, CT abd/pelvis, exam. UA is pending and will be followed up for ?UTI. WBC is normal, with normal diff, and pt afebrile.   Suspected Alzheimer's dementia: Based on neurology work up in August 2025.  - Continue aricept   Hyperbilirubinemia, AST elevation:  - Suspect this is related to dehydration primarily, will trend in AM, check fractionated bili.  - Hold statin for now  Hypothyroidism: TSH is 5.249.  - Continue thyroid  supplement   Advance Care Planning: Full code confirmed with patient, encouraged conversations with loved ones now.   Consults: None  Family Communication: Friend at bedside  Severity of Illness: The appropriate patient status for this patient is OBSERVATION. Observation status is judged to be reasonable and necessary in order to provide the required intensity of service to ensure the patient's safety. The patient's presenting symptoms, physical exam findings, and initial radiographic and laboratory data in the context of  their medical condition is felt to place them at decreased risk for further clinical deterioration. Furthermore, it is anticipated that the patient will be medically stable for discharge from the hospital within 2 midnights of admission.   Author: Bernardino KATHEE Come, MD 12/04/2023 2:50 PM  For on call review [email or web address].

## 2023-12-04 NOTE — ED Triage Notes (Signed)
 Pt sent from [facility] endocrinology for confusion and hypotension. Pt has dementia at base line. Pt is a/o x3. BP is 121/79 in triage. Pt states her family has been trying to get her to go to a facility but patient states she is not ready to do so.

## 2023-12-04 NOTE — ED Provider Notes (Signed)
 [place] EMERGENCY DEPARTMENT AT [facility] Provider Note   CSN: [id number] Arrival date & time: 12/04/23  1029     Patient presents with: Dementia   Christina Carroll is a 78 y.o. female.   Pt is a 78 yo female with pmhx significant for obesity, hld, htn, thyroiditis, OA, and recent dx of alzheimer's disease.  Pt has been having some memory issues, so saw Dr. Gregg on 8/28.  She had an ATN profile for dementia and results were c/w Alzheimer's disease.  She was started on Aricept .  She lives alone, drives, and cares for herself.  She showed up at her appointment with endocrinology and was hypotensive with BPs in the 80s and was confused.  She was sent here for further eval.  She denies any pain.  She said she's not had much of an appetite lately.       Prior to Admission medications   Medication Sig Start Date End Date Taking? Authorizing Provider  acetaminophen  (TYLENOL ) 500 MG tablet Take 2 tablets (1,000 mg total) by mouth every 6 (six) hours. 10/11/23   Patti Rosina SAUNDERS, PA-C  Ascorbic Acid (VITAMIN C PO) Take 1 tablet by mouth daily.    [provider]  BLACK CURRANT SEED OIL PO Take 1 capsule by mouth daily.    [provider]  Cholecalciferol (VITAMIN D ) 50 MCG (2000 UT) CAPS Take 1 capsule (2,000 Units total) by mouth daily. 04/17/23   Antonetta Rollene BRAVO, MD  CINNAMON PO Take 1 capsule by mouth daily.    [provider]  donepezil  (ARICEPT ) 5 MG tablet Take 1 tablet (5 mg total) by mouth at bedtime. 11/15/23   Gregg Lek, MD  LINIMENTS EX Apply 1 Application topically daily as needed (pain). Horse Liniment    [provider]  methocarbamol  (ROBAXIN ) 500 MG tablet Take 1 tablet (500 mg total) by mouth every 6 (six) hours as needed for muscle spasms (muscle/thigh pain). 10/11/23   Patti Rosina SAUNDERS, PA-C  NIFEdipine  (PROCARDIA  XL/NIFEDICAL XL) 60 MG 24 hr tablet TAKE 1 TABLET BY MOUTH DAILY 02/09/23   Antonetta Rollene BRAVO, MD   polyethylene glycol powder (GLYCOLAX /MIRALAX ) 17 GM/SCOOP powder Take 17 grams by mouth 2 (two) times daily. 10/11/23   Patti Rosina SAUNDERS, PA-C  rosuvastatin  (CRESTOR ) 20 MG tablet Take 1 tablet (20 mg total) by mouth daily. 08/30/23   Antonetta Rollene BRAVO, MD  traMADol  (ULTRAM ) 50 MG tablet Take 1 tablet (50 mg total) by mouth every 6 (six) hours as needed for severe pain (pain score 7-10). 10/11/23   Patti Rosina SAUNDERS, PA-C  triamterene -hydrochlorothiazide  (MAXZIDE -25) 37.5-25 MG tablet TAKE 1 TABLET BY MOUTH DAILY 05/28/23   Antonetta Rollene BRAVO, MD  UNITHROID  50 MCG tablet Take 1 tablet (50 mcg total) by mouth daily before breakfast. 06/04/23   Nida, Gebreselassie W, MD    Allergies: Patient has no known allergies.    Review of Systems  Constitutional:  Positive for appetite change.  All other systems reviewed and are negative.   Updated Vital Signs BP 121/79 (BP Location: Right Arm)   Pulse 98   Temp 98 F (36.7 C) (Oral)   Resp 16   Ht 5' 6 (1.676 m)   Wt 63.5 kg   SpO2 100%   BMI 22.60 kg/m   Physical Exam Vitals and nursing note reviewed.  Constitutional:      Appearance: Normal appearance.  HENT:     Head: Normocephalic and atraumatic.  Right Ear: External ear normal.     Left Ear: External ear normal.     Nose: Nose normal.     Mouth/Throat:     Mouth: Mucous membranes are dry.     Pharynx: Oropharynx is clear.  Eyes:     Extraocular Movements: Extraocular movements intact.     Conjunctiva/sclera: Conjunctivae normal.     Pupils: Pupils are equal, round, and reactive to light.  Cardiovascular:     Rate and Rhythm: Normal rate and regular rhythm.     Pulses: Normal pulses.     Heart sounds: Normal heart sounds.  Pulmonary:     Effort: Pulmonary effort is normal.     Breath sounds: Normal breath sounds.  Abdominal:     General: Abdomen is flat. Bowel sounds are normal.     Palpations: Abdomen is soft.  Musculoskeletal:        General: Normal range of  motion.     Cervical back: Normal range of motion and neck supple.  Skin:    General: Skin is warm.     Capillary Refill: Capillary refill takes less than 2 seconds.  Neurological:     General: No focal deficit present.     Mental Status: She is alert and oriented to person, place, and time.  Psychiatric:        Mood and Affect: Mood normal.        Behavior: Behavior normal.     (all labs ordered are listed, but only abnormal results are displayed) Labs Reviewed  CBC WITH DIFFERENTIAL/PLATELET - Abnormal; Notable for the following components:      Result Value   RBC 6.08 (*)    Hemoglobin 16.2 (*)    HCT 47.2 (*)    MCV 77.6 (*)    All other components within normal limits  COMPREHENSIVE METABOLIC PANEL WITH GFR - Abnormal; Notable for the following components:   Sodium 132 (*)    Potassium 2.4 (*)    Chloride 82 (*)    BUN 27 (*)    Creatinine, Ser 1.72 (*)    Calcium  10.9 (*)    Total Protein 8.4 (*)    AST 49 (*)    Total Bilirubin 2.2 (*)    GFR, Estimated 30 (*)    Anion gap 24 (*)    All other components within normal limits  LACTIC ACID, PLASMA - Abnormal; Notable for the following components:   Lactic Acid, Venous 3.2 (*)    All other components within normal limits  TSH - Abnormal; Notable for the following components:   TSH 5.249 (*)    All other components within normal limits  MAGNESIUM  - Abnormal; Notable for the following components:   Magnesium  3.0 (*)    All other components within normal limits  CBG MONITORING, ED - Abnormal; Notable for the following components:   Glucose-Capillary 61 (*)    All other components within normal limits  URINALYSIS, ROUTINE W REFLEX MICROSCOPIC  LACTIC ACID, PLASMA    EKG: EKG Interpretation Date/Time:  Tuesday December 04 2023 12:22:42 EDT Ventricular Rate:  68 PR Interval:  150 QRS Duration:  103 QT Interval:  421 QTC Calculation: 448 R Axis:   68  Text Interpretation: Sinus rhythm No significant change  since last tracing Confirmed by Dean Clarity [phone]) on 12/04/2023 2:40:57 PM  Radiology: CT CHEST ABDOMEN PELVIS W CONTRAST Result Date: 12/04/2023 CLINICAL DATA:  Sepsis, hypotension EXAM: CT CHEST, ABDOMEN, AND PELVIS WITH CONTRAST TECHNIQUE: Multidetector CT  imaging of the chest, abdomen and pelvis was performed following the standard protocol during bolus administration of intravenous contrast. RADIATION DOSE REDUCTION: This exam was performed according to the departmental dose-optimization program which includes automated exposure control, adjustment of the mA and/or kV according to patient size and/or use of iterative reconstruction technique. CONTRAST:  70mL OMNIPAQUE  IOHEXOL  300 MG/ML  SOLN COMPARISON:  None Available. FINDINGS: CT CHEST FINDINGS Cardiovascular: Normal caliber aorta. Scattered aortic calcifications. No pericardial effusion. Mediastinum/Nodes: No lymphadenopathy. Lungs/Pleura: Lungs are clear. No pleural effusions. No pneumothorax. Musculoskeletal: No acute osseous findings. CT ABDOMEN PELVIS FINDINGS Hepatobiliary: Subcentimeter right hepatic lobe hypodensity, too small to characterize but possibly representing a hepatic cyst. Pancreas: Unremarkable. Spleen: Unremarkable. Adrenals/Urinary Tract: Adrenal glands are unremarkable. Symmetric nephrograms. No hydronephrosis or nephrolithiasis. Stomach/Bowel: No evidence of bowel obstruction or inflammation. The visualized appendix is unremarkable. Vascular/Lymphatic: Normal caliber abdominal aorta. Reproductive: Unremarkable. Other: No free air or free fluid. Small fat containing periumbilical hernia. Musculoskeletal: No acute osseous findings. Right hip arthroplasty hardware with associated streak artifact, slightly limiting evaluation of the pelvis. IMPRESSION: No acute pathology in the abdomen or pelvis. Electronically Signed   By: Michaeline Blanch M.D.   On: 12/04/2023 14:24   DG Chest Portable 1 View Addendum Date: 12/04/2023 ADDENDUM  REPORT: 12/04/2023 11:45 ADDENDUM: Suspect calcified pulmonary granulomas bilaterally, and possible calcified right hilar lymph nodes, may be related to a granulomatous disease. Electronically Signed   By: Michaeline Blanch M.D.   On: 12/04/2023 11:45   Result Date: 12/04/2023 CLINICAL DATA:  Altered mental status, hypotension EXAM: PORTABLE CHEST 1 VIEW COMPARISON:  September 26, 2023 FINDINGS: The heart size and mediastinal contours are within normal limits. Both lungs are clear. No acute osseous findings. IMPRESSION: No active disease. Electronically Signed: By: Michaeline Blanch M.D. On: 12/04/2023 11:41     Procedures   Medications Ordered in the ED  potassium chloride  10 mEq in 100 mL IVPB (10 mEq Intravenous New Bag/Given 12/04/23 1414)  sodium chloride  0.9 % bolus 1,000 mL (1,000 mLs Intravenous New Bag/Given 12/04/23 1139)  sodium chloride  0.9 % bolus 1,000 mL (1,000 mLs Intravenous New Bag/Given 12/04/23 1330)  potassium chloride  SA (KLOR-CON  M) CR tablet 40 mEq (40 mEq Oral Given 12/04/23 1400)  iohexol  (OMNIPAQUE ) 300 MG/ML solution 70 mL (70 mLs Intravenous Contrast Given 12/04/23 1354)                                    Medical Decision Making Amount and/or Complexity of Data Reviewed Labs: ordered. Radiology: ordered.  Risk Prescription drug management. Decision regarding hospitalization.   This patient presents to the ED for concern of confusion, this involves an extensive number of treatment options, and is a complaint that carries with it a high risk of complications and morbidity.  The differential diagnosis includes dehydration, electrolyte abn, anemia, infection, alzheimer's   Co morbidities that complicate the patient evaluation  obesity, hld, htn, thyroiditis, OA, and recent dx of alzheimer's disease   Additional history obtained:  Additional history obtained from epic chart review    Lab Tests:  I Ordered, and personally interpreted labs.  The pertinent results  include:  cbc with hgb elevated at 16.2 (10.2 on 7/24) -> likely hemoconcentrated; cmp with na low at 132, k low at 2.4; bun 27 and cr 1.72 (cr 1.21 a month ago); tb sl elevated at 2.2; tsh sl elevated at 5.249; lactic elevated at  3.2   Imaging Studies ordered:  I ordered imaging studies including cxr and ct chest/abd/pelvis I independently visualized and interpreted imaging which showed  CXR: Suspect calcified pulmonary granulomas bilaterally, and possible calcified right hilar lymph nodes, may be related to a granulomatous disease. CT chest/abd/pelvis: No acute pathology in the abdomen or pelvis.  I agree with the radiologist interpretation   Cardiac Monitoring:  The patient was maintained on a cardiac monitor.  I personally viewed and interpreted the cardiac monitored which showed an underlying rhythm of: nsr   Medicines ordered and prescription drug management:  I ordered medication including ivfs, kcl  for sx  Reevaluation of the patient after these medicines showed that the patient improved I have reviewed the patients home medicines and have made adjustments as needed   Test Considered:  ct   Critical Interventions:  fluids   Consultations Obtained:  I requested consultation with the hospitalist (Dr. Bryn),  and discussed lab and imaging findings as well as pertinent plan - he will admit   Problem List / ED Course:  Hypotension:  likely due to dehydration.  Pt does not have any evidence of infection.  IVFs given.  BP improved with fluids.  It is also unclear if she's been taking her medications properly. Lactic acidosis:  likely due to dehydration.  IVFs given. Hypokalemia:  iv and oral k given.  Pt has not been eating/drinking well.   Reevaluation:  After the interventions noted above, I reevaluated the patient and found that they have :improved   Social Determinants of Health:  Lives alone   Dispostion:  After consideration of the diagnostic  results and the patients response to treatment, I feel that the patent would benefit from admission.       Final diagnoses:  Dehydration  AKI (acute kidney injury) (HCC)  Hypokalemia  Lactic acidosis    ED Discharge Orders     None          Dean Clarity, MD 12/04/23 1450

## 2023-12-05 DIAGNOSIS — E860 Dehydration: Secondary | ICD-10-CM | POA: Diagnosis not present

## 2023-12-05 LAB — COMPREHENSIVE METABOLIC PANEL WITH GFR
ALT: 26 U/L (ref 0–44)
AST: 34 U/L (ref 15–41)
Albumin: 3.1 g/dL — ABNORMAL LOW (ref 3.5–5.0)
Alkaline Phosphatase: 64 U/L (ref 38–126)
Anion gap: 10 (ref 5–15)
BUN: 19 mg/dL (ref 8–23)
CO2: 25 mmol/L (ref 22–32)
Calcium: 8.6 mg/dL — ABNORMAL LOW (ref 8.9–10.3)
Chloride: 100 mmol/L (ref 98–111)
Creatinine, Ser: 1.26 mg/dL — ABNORMAL HIGH (ref 0.44–1.00)
GFR, Estimated: 44 mL/min — ABNORMAL LOW (ref >60)
Glucose, Bld: 75 mg/dL (ref 70–99)
Potassium: 2.8 mmol/L — ABNORMAL LOW (ref 3.5–5.1)
Sodium: 135 mmol/L (ref 135–145)
Total Bilirubin: 1.1 mg/dL (ref 0.0–1.2)
Total Protein: 5.5 g/dL — ABNORMAL LOW (ref 6.5–8.1)

## 2023-12-05 LAB — CBC
HCT: 34.3 % — ABNORMAL LOW (ref 36.0–46.0)
Hemoglobin: 11.5 g/dL — ABNORMAL LOW (ref 12.0–15.0)
MCH: 26.7 pg (ref 26.0–34.0)
MCHC: 33.5 g/dL (ref 30.0–36.0)
MCV: 79.8 fL — ABNORMAL LOW (ref 80.0–100.0)
Platelets: 310 K/uL (ref 150–400)
RBC: 4.3 MIL/uL (ref 3.87–5.11)
RDW: 13.8 % (ref 11.5–15.5)
WBC: 6.5 K/uL (ref 4.0–10.5)
nRBC: 0 % (ref 0.0–0.2)

## 2023-12-05 LAB — POTASSIUM: Potassium: 2.9 mmol/L — ABNORMAL LOW (ref 3.5–5.1)

## 2023-12-05 MED ORDER — POTASSIUM CHLORIDE CRYS ER 20 MEQ PO TBCR: 40.0000 meq | EXTENDED_RELEASE_TABLET | Freq: Every day | ORAL | 1 refills | Status: DC

## 2023-12-05 MED ORDER — POTASSIUM CHLORIDE CRYS ER 20 MEQ PO TBCR
40.0000 meq | EXTENDED_RELEASE_TABLET | Freq: Once | ORAL | Status: AC
  Administered 2023-12-05: 40 meq via ORAL
  Filled 2023-12-05: qty 2

## 2023-12-05 MED ORDER — POTASSIUM CHLORIDE CRYS ER 20 MEQ PO TBCR
40.0000 meq | EXTENDED_RELEASE_TABLET | Freq: Once | ORAL | Status: AC
Start: 2023-12-05 — End: 2023-12-05
  Administered 2023-12-05: 40 meq via ORAL
  Filled 2023-12-05: qty 2

## 2023-12-05 MED ORDER — ENOXAPARIN SODIUM 40 MG/0.4ML IJ SOSY: 40.0000 mg | PREFILLED_SYRINGE | INTRAMUSCULAR | Status: DC

## 2023-12-05 MED ORDER — SODIUM CHLORIDE 0.9 % IV SOLN: INTRAVENOUS | Status: DC

## 2023-12-05 NOTE — Plan of Care (Signed)

## 2023-12-05 NOTE — Care Management Obs Status (Signed)
 MEDICARE OBSERVATION STATUS NOTIFICATION   Patient Details  Name: Christina Carroll MRN: [id number] Date of Birth: 26-Sep-1945   Medicare Observation Status Notification Given:  Yes    Christina Carroll 12/05/2023, 2:33 PM

## 2023-12-05 NOTE — Plan of Care (Signed)
  Problem: Education: Goal: Knowledge of General Education information will improve Description: Including pain rating scale, medication(s)/side effects and non-pharmacologic comfort measures 12/05/2023 1605 by Mathews Norleen POUR, RN Outcome: Adequate for Discharge 12/05/2023 1018 by Dontavis Tschantz, Norleen POUR, RN Outcome: Progressing   Problem: Health Behavior/Discharge Planning: Goal: Ability to manage health-related needs will improve 12/05/2023 1605 by Mathews Norleen POUR, RN Outcome: Adequate for Discharge 12/05/2023 1018 by Mathews Norleen POUR, RN Outcome: Progressing   Problem: Clinical Measurements: Goal: Ability to maintain clinical measurements within normal limits will improve 12/05/2023 1605 by Mathews Norleen POUR, RN Outcome: Adequate for Discharge 12/05/2023 1018 by Mathews Norleen POUR, RN Outcome: Progressing Goal: Will remain free from infection 12/05/2023 1605 by Mathews Norleen POUR, RN Outcome: Adequate for Discharge 12/05/2023 1018 by Mathews Norleen POUR, RN Outcome: Progressing Goal: Diagnostic test results will improve 12/05/2023 1605 by Mathews Norleen POUR, RN Outcome: Adequate for Discharge 12/05/2023 1018 by Mathews Norleen POUR, RN Outcome: Progressing Goal: Respiratory complications will improve 12/05/2023 1605 by Mathews Norleen POUR, RN Outcome: Adequate for Discharge 12/05/2023 1018 by Mathews Norleen POUR, RN Outcome: Progressing Goal: Cardiovascular complication will be avoided 12/05/2023 1605 by Mathews Norleen POUR, RN Outcome: Adequate for Discharge 12/05/2023 1018 by Mathews Norleen POUR, RN Outcome: Progressing   Problem: Activity: Goal: Risk for activity intolerance will decrease 12/05/2023 1605 by Mathews Norleen POUR, RN Outcome: Adequate for Discharge 12/05/2023 1018 by Mathews Norleen POUR, RN Outcome: Progressing   Problem: Nutrition: Goal: Adequate nutrition will be maintained 12/05/2023 1605 by Mathews Norleen POUR, RN Outcome: Adequate for Discharge 12/05/2023 1018 by Mathews Norleen POUR, RN Outcome: Progressing   Problem: Coping: Goal: Level of anxiety  will decrease 12/05/2023 1605 by Mathews Norleen POUR, RN Outcome: Adequate for Discharge 12/05/2023 1018 by Mathews Norleen POUR, RN Outcome: Progressing   Problem: Elimination: Goal: Will not experience complications related to bowel motility 12/05/2023 1605 by Mathews Norleen POUR, RN Outcome: Adequate for Discharge 12/05/2023 1018 by Mathews Norleen POUR, RN Outcome: Progressing Goal: Will not experience complications related to urinary retention 12/05/2023 1605 by Mathews Norleen POUR, RN Outcome: Adequate for Discharge 12/05/2023 1018 by Mathews Norleen POUR, RN Outcome: Progressing   Problem: Pain Managment: Goal: General experience of comfort will improve and/or be controlled 12/05/2023 1605 by Mathews Norleen POUR, RN Outcome: Adequate for Discharge 12/05/2023 1018 by Mathews Norleen POUR, RN Outcome: Progressing   Problem: Safety: Goal: Ability to remain free from injury will improve 12/05/2023 1605 by Mathews Norleen POUR, RN Outcome: Adequate for Discharge 12/05/2023 1018 by Mathews Norleen POUR, RN Outcome: Progressing   Problem: Skin Integrity: Goal: Risk for impaired skin integrity will decrease 12/05/2023 1605 by Mathews Norleen POUR, RN Outcome: Adequate for Discharge 12/05/2023 1018 by Mathews Norleen POUR, RN Outcome: Progressing

## 2023-12-05 NOTE — TOC Initial Note (Signed)
 Transition of Care [facility]) - Initial/Assessment Note    Patient Details  Name: Christina Carroll MRN: [id number] Date of Birth: 1946-03-05  Transition of Care [facility]) CM/SW Contact:    Mcarthur Saddie Kim, LCSW Phone Number: 12/05/2023, 9:51 AM  Clinical Narrative: Pt admitted due to acute kidney injury. She reports she lives alone and is independent with ADLs and still drives. PT evaluated pt and recommend home health. Discussed with pt who declines as she states she is at her baseline. Pt has a cane and walker at home if needed. No other needs reported. TOC will follow.                   Expected Discharge Plan: Home/Self Care Barriers to Discharge: Continued Medical Work up   Patient Goals and CMS Choice Patient states their goals for this hospitalization and ongoing recovery are:: return home   Choice offered to / list presented to : Patient [place] ownership interest in [facility].provided to::  (n/a)    Expected Discharge Plan and Services In-house Referral: Clinical Social Work     Living arrangements for the past 2 months: Single Family Home                           HH Arranged: Refused HH          Prior Living Arrangements/Services Living arrangements for the past 2 months: Single Family Home Lives with:: Self Patient language and need for interpreter reviewed:: Yes Do you feel safe going back to the place where you live?: Yes      Need for Family Participation in Patient Care: No (Comment)   Current home services: DME (cane, walker) Criminal Activity/Legal Involvement Pertinent to Current Situation/Hospitalization: No - Comment as needed  Activities of Daily Living   ADL Screening (condition at time of admission) Independently performs ADLs?: Yes (appropriate for developmental age) Is the patient deaf or have difficulty hearing?: No Does the patient have difficulty seeing, even when wearing glasses/contacts?: No Does the patient  have difficulty concentrating, remembering, or making decisions?: No  Permission Sought/Granted                  Emotional Assessment     Affect (typically observed): Appropriate Orientation: : Oriented to Self, Oriented to Place, Oriented to  Time, Oriented to Situation Alcohol / Substance Use: Not Applicable Psych Involvement: No (comment)  Admission diagnosis:  Dehydration [E86.0] Hypokalemia [E87.6] Lactic acidosis [E87.20] AKI (acute kidney injury) (HCC) [N17.9] Patient Active Problem List   Diagnosis Date Noted   Dehydration 12/04/2023   S/P total right hip arthroplasty 10/09/2023   Preop examination 08/30/2023   Intermittent confusion 08/07/2023   Impaired memory 08/07/2023   Varicose veins of right lower extremity with pain 05/06/2023   Pain of right lower extremity 05/06/2023   Right hip pain 01/14/2023   Hypercalcemia 01/12/2023   Depression with anxiety 11/09/2022   Hypothyroid 11/09/2022   Poor appetite 11/09/2022   Weight loss, unintentional 11/09/2022   Encounter for Medicare annual examination with abnormal findings 07/28/2022   Vitamin D  deficiency 03/23/2022   Sciatica of right side 10/24/2019   Osteoarthritis of right hip 10/24/2019   Fall 09/24/2019   Hearing loss 03/19/2018   Special screening for malignant neoplasms, colon 08/17/2016   Prediabetes 02/22/2012   Restless leg 09/19/2011   Hyperlipidemia 08/30/2007   Overweight (BMI 25.0-29.9) 08/30/2007   Essential hypertension, benign 08/30/2007   PCP:  Antonetta Rollene BRAVO, MD Pharmacy:   [facility]) - [place], [place] - [address] [facility] [address] [place] [address] [place] [address] Phone: [phone] Fax: [phone]  [facility] - [place], [place] - [address] [place] [address] [place] [address] Phone: [phone] Fax: [phone]  [facility] [place], [place] - [address]  [place] [address] Phone: [phone] Fax: [phone]  DARRYLE LONG - [facility] [address] [place] [address] Phone: [phone] Fax: [phone]     Social Drivers of Health (SDOH) Social History: SDOH Screenings   Food Insecurity: No Food Insecurity (12/04/2023)  Housing: Low Risk  (12/04/2023)  Transportation Needs: No Transportation Needs (12/04/2023)  Utilities: Not At Risk (12/04/2023)  Alcohol Screen: Low Risk  (06/16/2020)  Depression (PHQ2-9): Low Risk  (08/30/2023)  Financial Resource Strain: Low Risk  (07/02/2022)  Physical Activity: Unknown (07/02/2022)  Social Connections: Moderately Integrated (12/04/2023)  Stress: No Stress Concern Present (07/02/2022)  Tobacco Use: Low Risk  (12/04/2023)   SDOH Interventions:     Readmission Risk Interventions     No data to display

## 2023-12-05 NOTE — Discharge Summary (Addendum)
 Physician Discharge Summary   Patient: Christina Carroll MRN: [id number] DOB: 1945-04-19  Admit date:     12/04/2023  Discharge date: 12/05/23  Discharge Physician: Adriana DELENA Grams   PCP: Antonetta Rollene BRAVO, MD   Recommendations at discharge:   Follow-up with PCP in 1 week BMP and 1 week-results to PCP monitor kidney function and electrolyte levels Holding blood pressure medications including Procardia , hydrochlorothiazide  Encouraging to improve oral intake, oral hydration  Discharge Diagnoses: Principal Problem:   Dehydration Active Problems:   Hyperlipidemia   Essential hypertension, benign   Restless leg   Hypothyroid   Poor appetite   Hypercalcemia  Christina Carroll is a 78 y.o. female with a history of HTN, HLD, hypothyroidism, and recent diagnosis of Alzheimer's dementia who presented to the ED due to confusion and hypotension. She had presented to an endocrionlogy office thinking she had an appointment today (friend states she didn't), and was found to be more confused and hypotensive, referred to the ED. The patient is s suboptimal historian, reports some time (maybe weeks) of poor oral intake. Denies nausea, vomiting, abdominal pain or other physical complaints. No fevers, has not felt unwell. Friend states she seemed more dehydrated. Has become increasingly confused over the past weeks-months despite starting aricept , family has begun process of admission to memory care, though the patient is resistant.    AKI: Suspect hemodynamically mediated injury with poor oral intake, dehydration, and hypotension, compounded by use of diuretics (it's possible she's taking too much or too little).  - Discussed rehydration with IV fluids - Will continue to hold diuretics,  - Courage patient to avoid nephrotoxins, continue oral hydration - Continue home medication of HCTZ  Hypokalemia:  -Potassium was repleted - Continue thiazide and supplement and additional 40mEq, - Mg  is high at 3.0.    Hypercalcemia: Note no anemia or bone pain/fractures.  - Mild, will plan to recheck with ionized calcium  in AM after rehydration.    Hypovolemic hyponatremia:  - Given isotonic saline, will continue and allow regular diet for now.    Lactic acidemia:  - POA lactic acid 3.2 >>> 1.9 now -No signs of infection, no complaint of dysuria, UA within normal limits with rare bacteria, WBC of 6-10 negative for any nitrites, moderate leukocytes Suspect hypovolemic cause, improved as blood pressure improved - Vitals remained stable, afebrile, mildly hypertensive on arrival, improved-stabilized - No evidence of infection thus far on CXR, CT abd/pelvis, exam.    Suspected Alzheimer's dementia: Based on neurology work up in August 2025.  - Continue aricept    Hyperbilirubinemia, AST elevation:  - Suspect this is related to dehydration primarily,  Improved AST, bilirubin, - Hold statin for now   Hypothyroidism: TSH is 5.249.  - Continue thyroid  supplement    Disposition: Home Diet recommendation:  Discharge Diet Orders (From admission, onward)     Start     Ordered   12/05/23 0000  Diet - low sodium heart healthy        12/05/23 1331           Regular diet DISCHARGE MEDICATION: Allergies as of 12/05/2023   No Known Allergies      Medication List     PAUSE taking these medications    NIFEdipine  60 MG 24 hr tablet Wait to take this until: December 10, 2023 Commonly known as: PROCARDIA  XL/NIFEDICAL XL TAKE 1 TABLET BY MOUTH DAILY       STOP taking these medications    triamterene -hydrochlorothiazide  37.5-25 MG  tablet Commonly known as: MAXZIDE -25       TAKE these medications    BLACK CURRANT SEED OIL PO Take 1 capsule by mouth daily.   CINNAMON PO Take 1 capsule by mouth daily.   donepezil  5 MG tablet Commonly known as: ARICEPT  Take 1 tablet (5 mg total) by mouth at bedtime.   LINIMENTS EX Apply 1 Application topically daily as needed  (pain). Horse Liniment   potassium chloride  SA 20 MEQ tablet Commonly known as: KLOR-CON  M Take 2 tablets (40 mEq total) by mouth daily for 3 days.   rosuvastatin  20 MG tablet Commonly known as: Crestor  Take 1 tablet (20 mg total) by mouth daily.   Unithroid  50 MCG tablet Generic drug: levothyroxine  Take 1 tablet (50 mcg total) by mouth daily before breakfast.   VITAMIN C PO Take 1 tablet by mouth daily.   Vitamin D  50 MCG (2000 UT) Caps Take 1 capsule (2,000 Units total) by mouth daily.        Discharge Exam: Filed Weights   12/04/23 1055 12/04/23 2027  Weight: 63.5 kg 56.8 kg   General:  AAO x 3,  cooperative, no distress;   HEENT:  Normocephalic, PERRL, otherwise with in Normal limits   Neuro:  CNII-XII intact. , normal motor and sensation, reflexes intact   Lungs:   Clear to auscultation BL, Respirations unlabored,  No wheezes / crackles  Cardio:    S1/S2, RRR, No murmure, No Rubs or Gallops   Abdomen:  Soft, non-tender, bowel sounds active all four quadrants, no guarding or peritoneal signs.  Muscular  skeletal:  Limited exam -global generalized weaknesses - in bed, able to move all 4 extremities,   2+ pulses,  symmetric, No pitting edema  Skin:  Dry, warm to touch, negative for any Rashes,  Wounds: Please see nursing documentation    Condition at discharge: good  The results of significant diagnostics from this hospitalization (including imaging, microbiology, ancillary and laboratory) are listed below for reference.   Imaging Studies: CT CHEST ABDOMEN PELVIS W CONTRAST Result Date: 12/04/2023 CLINICAL DATA:  Sepsis, hypotension EXAM: CT CHEST, ABDOMEN, AND PELVIS WITH CONTRAST TECHNIQUE: Multidetector CT imaging of the chest, abdomen and pelvis was performed following the standard protocol during bolus administration of intravenous contrast. RADIATION DOSE REDUCTION: This exam was performed according to the departmental dose-optimization program which includes  automated exposure control, adjustment of the mA and/or kV according to patient size and/or use of iterative reconstruction technique. CONTRAST:  70mL OMNIPAQUE  IOHEXOL  300 MG/ML  SOLN COMPARISON:  None Available. FINDINGS: CT CHEST FINDINGS Cardiovascular: Normal caliber aorta. Scattered aortic calcifications. No pericardial effusion. Mediastinum/Nodes: No lymphadenopathy. Lungs/Pleura: Lungs are clear. No pleural effusions. No pneumothorax. Musculoskeletal: No acute osseous findings. CT ABDOMEN PELVIS FINDINGS Hepatobiliary: Subcentimeter right hepatic lobe hypodensity, too small to characterize but possibly representing a hepatic cyst. Pancreas: Unremarkable. Spleen: Unremarkable. Adrenals/Urinary Tract: Adrenal glands are unremarkable. Symmetric nephrograms. No hydronephrosis or nephrolithiasis. Stomach/Bowel: No evidence of bowel obstruction or inflammation. The visualized appendix is unremarkable. Vascular/Lymphatic: Normal caliber abdominal aorta. Reproductive: Unremarkable. Other: No free air or free fluid. Small fat containing periumbilical hernia. Musculoskeletal: No acute osseous findings. Right hip arthroplasty hardware with associated streak artifact, slightly limiting evaluation of the pelvis. IMPRESSION: No acute pathology in the abdomen or pelvis. Electronically Signed   By: Michaeline Blanch M.D.   On: 12/04/2023 14:24   DG Chest Portable 1 View Addendum Date: 12/04/2023 ADDENDUM REPORT: 12/04/2023 11:45 ADDENDUM: Suspect calcified pulmonary granulomas bilaterally, and  possible calcified right hilar lymph nodes, may be related to a granulomatous disease. Electronically Signed   By: Michaeline Blanch M.D.   On: 12/04/2023 11:45   Result Date: 12/04/2023 CLINICAL DATA:  Altered mental status, hypotension EXAM: PORTABLE CHEST 1 VIEW COMPARISON:  September 26, 2023 FINDINGS: The heart size and mediastinal contours are within normal limits. Both lungs are clear. No acute osseous findings. IMPRESSION: No active  disease. Electronically Signed: By: Michaeline Blanch M.D. On: 12/04/2023 11:41    Microbiology: Results for orders placed or performed during the hospital encounter of 09/28/23  Surgical pcr screen     Status: None   Collection Time: 09/28/23 10:43 AM   Specimen: Nasal Mucosa; Nasal Swab  Result Value Ref Range Status   MRSA, PCR NEGATIVE NEGATIVE Final   Staphylococcus aureus NEGATIVE NEGATIVE Final    Comment: (NOTE) The Xpert SA Assay (FDA approved for NASAL specimens in patients 43 years of age and older), is one component of a comprehensive surveillance program. It is not intended to diagnose infection nor to guide or monitor treatment. Performed at [facility], [address], [place], [place] [address]     Labs: CBC: Recent Labs  Lab 12/04/23 1136 12/05/23 0418  WBC 6.3 6.5  NEUTROABS 4.3  --   HGB 16.2* 11.5*  HCT 47.2* 34.3*  MCV 77.6* 79.8*  PLT 358 310   Basic Metabolic Panel: Recent Labs  Lab 12/04/23 1136 12/05/23 0418 12/05/23 1409  NA 132* 135  --   K 2.4* 2.8* 2.9*  CL 82* 100  --   CO2 26 25  --   GLUCOSE 90 75  --   BUN 27* 19  --   CREATININE 1.72* 1.26*  --   CALCIUM  10.9* 8.6*  --   MG 3.0*  --   --    Liver Function Tests: Recent Labs  Lab 12/04/23 1136 12/05/23 0418  AST 49* 34  ALT 39 26  ALKPHOS 103 64  BILITOT 2.2* 1.1  PROT 8.4* 5.5*  ALBUMIN 4.6 3.1*   CBG: Recent Labs  Lab 12/04/23 1422  GLUCAP 61*    Discharge time spent: greater than 30 minutes.  Signed: Adriana DELENA Grams, MD [facility] 12/05/2023

## 2023-12-05 NOTE — Evaluation (Signed)
 Physical Therapy Evaluation Patient Details Name: Christina Carroll MRN: [id number] DOB: 05-Sep-1945 Today's Date: 12/05/2023  History of Present Illness  Christina Carroll is a 78 y.o. female with a history of HTN, HLD, hypothyroidism, and recent diagnosis of Alzheimer's dementia who presented to the ED due to confusion and hypotension. She had presented to an endocrionlogy office thinking she had an appointment today (friend states she didn't), and was found to be more confused and hypotensive, referred to the ED. The patient is s suboptimal historian, reports some time (maybe weeks) of poor oral intake. Denies nausea, vomiting, abdominal pain or other physical complaints. No fevers, has not felt unwell. Friend states she seemed more dehydrated. Has become increasingly confused over the past weeks-months despite starting aricept , family has begun process of admission to memory care, though the patient is resistant.    Clinical Impression  Patient demonstrates modified independence with bed mobility, functional transfers, and ambulation. Pt does demonstrate decreased LE strength, abnormal gait speed, and impaired balance. Patient also demonstrates difficulty with ambulation during today's session with need for RW for balance, decreased stride length and velocity noted. Patient also demonstrates intense urgency to return home with some agitation towards son who is helpful with obtaining clear subjective. Patient requires education on PT recommendations and use of RW for security. Patient discharged from acute physical therapy to care of nursing/mobility team for ambulation daily as tolerated for length of stay and to receive all further needs at next venue of care.          If plan is discharge home, recommend the following: A little help with walking and/or transfers;A little help with bathing/dressing/bathroom;Assistance with feeding;Assistance with cooking/housework;Assist for  transportation;Help with stairs or ramp for entrance   Can travel by private vehicle        Equipment Recommendations Rolling walker (2 wheels)  Recommendations for Other Services       Functional Status Assessment Patient has had a recent decline in their functional status and demonstrates the ability to make significant improvements in function in a reasonable and predictable amount of time.     Precautions / Restrictions Precautions Precautions: Fall Recall of Precautions/Restrictions: Intact Restrictions Weight Bearing Restrictions Per Provider Order: No      Mobility  Bed Mobility Overal bed mobility: Modified Independent                  Transfers Overall transfer level: Modified independent                 General transfer comment: decreased speed    Ambulation/Gait Ambulation/Gait assistance: Modified independent (Device/Increase time), Supervision Gait Distance (Feet): 50 Feet Assistive device: Rolling walker (2 wheels) Gait Pattern/deviations: WFL(Within Functional Limits), Decreased stride length Gait velocity: decreased     General Gait Details: labored movement, gait belt and RW for safety  Stairs            Wheelchair Mobility     Tilt Bed    Modified Rankin (Stroke Patients Only)       Balance Overall balance assessment: Modified Independent                                           Pertinent Vitals/Pain Pain Assessment Pain Assessment: 0-10 Pain Score: 0-No pain    Home Living Family/patient expects to be discharged to:: Private residence Living Arrangements: Alone Available Help  at Discharge: Family;Available 24 hours/day Type of Home: House Home Access: Stairs to enter   [employer] of Steps: 2   Home Layout: One level Home Equipment: Shower seat;Grab bars - tub/shower;Cane - single point;BSC/3in1      Prior Function Prior Level of Function : Independent/Modified  Independent;Driving             Mobility Comments: could ambulate in community without ad ADLs Comments: independent adls and iadls     Extremity/Trunk Assessment   Upper Extremity Assessment Upper Extremity Assessment: Overall WFL for tasks assessed    Lower Extremity Assessment Lower Extremity Assessment: Generalized weakness;Overall [facility] for tasks assessed    Cervical / Trunk Assessment Cervical / Trunk Assessment: Kyphotic  Communication   Communication Communication: No apparent difficulties    Cognition Arousal: Alert Behavior During Therapy: WFL for tasks assessed/performed   PT - Cognitive impairments: No apparent impairments                       PT - Cognition Comments: pt get annoyed at son helping with subjective although son does correct on a few items Following commands: Intact       Cueing Cueing Techniques: Verbal cues     General Comments      Exercises     Assessment/Plan    PT Assessment All further PT needs can be met in the next venue of care  PT Problem List Decreased strength;Decreased mobility;Decreased activity tolerance       PT Treatment Interventions      PT Goals (Current goals can be found in the Care Plan section)  Acute Rehab PT Goals Patient Stated Goal: to return home PT Goal Formulation: With patient/family Time For Goal Achievement: 12/07/23 Potential to Achieve Goals: Good    Frequency       Co-evaluation               AM-PAC PT 6 Clicks Mobility  Outcome Measure Help needed turning from your back to your side while in a flat bed without using bedrails?: None Help needed moving from lying on your back to sitting on the side of a flat bed without using bedrails?: None Help needed moving to and from a bed to a chair (including a wheelchair)?: None Help needed standing up from a chair using your arms (e.g., wheelchair or bedside chair)?: None Help needed to walk in hospital room?: A  Little Help needed climbing 3-5 steps with a railing? : A Lot 6 Click Score: 21    End of Session Equipment Utilized During Treatment: Gait belt Activity Tolerance: Patient tolerated treatment well Patient left: in chair;with family/visitor present Nurse Communication: Mobility status PT Visit Diagnosis: Other abnormalities of gait and mobility (R26.89)    Time: 9179-9154 PT Time Calculation (min) (ACUTE ONLY): 25 min   Charges:   PT Evaluation $PT Eval Low Complexity: 1 Low PT Treatments $Therapeutic Activity: 8-22 mins PT General Charges $$ ACUTE PT VISIT: 1 Visit        Lang Ada, PT, DPT [facility] Office: [phone] 9:47 AM, 12/05/23

## 2023-12-06 ENCOUNTER — Telehealth: Payer: Self-pay | Admitting: Neurology

## 2023-12-06 ENCOUNTER — Telehealth: Payer: Self-pay

## 2023-12-06 MED ORDER — DONEPEZIL HCL 5 MG PO TABS: 5.0000 mg | ORAL_TABLET | Freq: Every day | ORAL | 4 refills | Status: AC

## 2023-12-06 NOTE — Transitions of Care (Post Inpatient/ED Visit) (Signed)
   12/06/2023  Name: Christina Carroll MRN: [id number] DOB: 04-11-1945  Today's TOC FU Call Status: Today's TOC FU Call Status:: Successful TOC FU Call Completed TOC FU Call Complete Date: 12/06/23 Patient's Name and Date of Birth confirmed.  Transition Care Management Follow-up Telephone Call Date of Discharge: 12/05/23 Discharge [facility] Type of Discharge: Inpatient Admission Primary Inpatient Discharge Diagnosis:: dehydration How have you been since you were released from the hospital?: Better Any questions or concerns?: No  Items Reviewed: Did you receive and understand the discharge instructions provided?: Yes Medications obtained,verified, and reconciled?: Yes (Medications Reviewed) Any new allergies since your discharge?: No Dietary orders reviewed?: Yes Do you have support at home?: Yes People in Home [RPT]: child(ren), adult  Medications Reviewed Today: Medications Reviewed Today     Reviewed by Emmitt Pan, LPN (Licensed Practical Nurse) on 12/06/23 at 1540  Med List Status: <None>   Medication Order Taking? Sig Documenting Provider Last Dose Status Informant  Ascorbic Acid (VITAMIN C PO) [id number] Yes Take 1 tablet by mouth daily. [provider]  Active Pharmacy Records, Child  BLACK CURRANT SEED OIL PO [id number] Yes Take 1 capsule by mouth daily. [provider]  Active Pharmacy Records, Child  Cholecalciferol (VITAMIN D ) 50 MCG (2000 UT) CAPS [id number] Yes Take 1 capsule (2,000 Units total) by mouth daily. Antonetta Rollene BRAVO, MD  Active Pharmacy Records, Child  CINNAMON PO [id number] Yes Take 1 capsule by mouth daily. [provider]  Active Pharmacy Records, Child  donepezil  (ARICEPT ) 5 MG tablet [id number] Yes Take 1 tablet (5 mg total) by mouth at bedtime. Gregg Lek, MD  Active   LINIMENTS [place] [id number] Yes Apply 1 Application topically daily as needed (pain). Horse Liniment [provider]  Active  Pharmacy Records, Child  NIFEdipine  (PROCARDIA  XL/NIFEDICAL XL) 60 MG 24 hr tablet [id number]  TAKE 1 TABLET BY MOUTH DAILY  Patient not taking: Reported on 12/06/2023   Antonetta Rollene BRAVO, MD  Active Pharmacy Records, Child  potassium chloride  SA (KLOR-CON  M) 20 MEQ tablet [id number] Yes Take 2 tablets (40 mEq total) by mouth daily for 3 days. Willette Adriana LABOR, MD  Active   rosuvastatin  (CRESTOR ) 20 MG tablet [id number] Yes Take 1 tablet (20 mg total) by mouth daily. Antonetta Rollene BRAVO, MD  Active Pharmacy Records, Child  UNITHROID  50 MCG tablet [id number] Yes Take 1 tablet (50 mcg total) by mouth daily before breakfast. Nida, Gebreselassie W, MD  Active Pharmacy Records, Child            Home Care and Equipment/Supplies: Were Home Health Services Ordered?: NA Any new equipment or medical supplies ordered?: NA  Functional Questionnaire: Do you need assistance with bathing/showering or dressing?: No Do you need assistance with meal preparation?: No Do you need assistance with eating?: No Do you have difficulty maintaining continence: No Do you need assistance with getting out of bed/getting out of a chair/moving?: No Do you have difficulty managing or taking your medications?: No  Follow up appointments reviewed: PCP Follow-up appointment confirmed?: No (no avail appt , sent message to staff to schedule) MD Provider Line Number:[phone] Given: No Specialist Hospital Follow-up appointment confirmed?: NA Do you need transportation to your follow-up appointment?: No Do you understand care options if your condition(s) worsen?: Yes-patient verbalized understanding    SIGNATURE Pan Emmitt, LPN [facility] Nurse Health Advisor Direct Dial [phone]

## 2023-12-06 NOTE — Telephone Encounter (Signed)
 Patient's son, Nishat Livingston said,  came to visit my mother and she told me she has not gotten donepezil  (ARICEPT ) 5 MG tablet in the mail that was prescribed last office visit.  Would like to change to a local pharmacy and have arranged for someone to pick up her medication. Could you please send prescription to [facility] -

## 2023-12-07 LAB — CALCIUM, IONIZED: Calcium, Ionized, Serum: 5.2 mg/dL (ref 4.5–5.6)

## 2023-12-20 ENCOUNTER — Telehealth: Payer: Self-pay | Admitting: "Endocrinology

## 2023-12-20 NOTE — Telephone Encounter (Signed)
 Pt lvm with [facility] about a missed appt.  When does she need a follow up?

## 2023-12-24 NOTE — Telephone Encounter (Signed)
 Pt is scheduled

## 2023-12-25 ENCOUNTER — Ambulatory Visit: Admitting: "Endocrinology

## 2023-12-31 ENCOUNTER — Other Ambulatory Visit (HOSPITAL_COMMUNITY): Payer: Self-pay

## 2024-01-02 ENCOUNTER — Telehealth: Payer: Self-pay | Admitting: Neurology

## 2024-01-02 ENCOUNTER — Encounter (INDEPENDENT_AMBULATORY_CARE_PROVIDER_SITE_OTHER): Payer: Self-pay | Admitting: Gastroenterology

## 2024-01-02 NOTE — Telephone Encounter (Signed)
 Tabitha from [facility] called stating that the pt is having a teeth cleaning on Oct 30th and they are wanting to know if the pt has to be premed. Please advise.

## 2024-01-03 NOTE — Telephone Encounter (Signed)
 Patient is being seen for memory loss. They should check with PCP to see if she needs to be premedicated prior to dental procedure.

## 2024-01-03 NOTE — Telephone Encounter (Signed)
 Dr.Camara see the below. Please advise

## 2024-01-03 NOTE — Telephone Encounter (Signed)
  Called and relayed that they will need to reach out to PCP for any premeds.

## 2024-01-08 ENCOUNTER — Encounter: Payer: Self-pay | Admitting: "Endocrinology

## 2024-01-08 ENCOUNTER — Ambulatory Visit (INDEPENDENT_AMBULATORY_CARE_PROVIDER_SITE_OTHER): Admitting: "Endocrinology

## 2024-01-08 VITALS — BP 114/68 | HR 68 | Ht 65.0 in | Wt 126.4 lb

## 2024-01-08 DIAGNOSIS — E063 Autoimmune thyroiditis: Secondary | ICD-10-CM

## 2024-01-08 DIAGNOSIS — E876 Hypokalemia: Secondary | ICD-10-CM | POA: Diagnosis not present

## 2024-01-08 DIAGNOSIS — I10 Essential (primary) hypertension: Secondary | ICD-10-CM | POA: Diagnosis not present

## 2024-01-08 MED ORDER — POTASSIUM CHLORIDE CRYS ER 20 MEQ PO TBCR: 20.0000 meq | EXTENDED_RELEASE_TABLET | Freq: Every day | ORAL | 1 refills | Status: DC

## 2024-01-08 MED ORDER — UNITHROID 75 MCG PO TABS: 75.0000 ug | ORAL_TABLET | Freq: Every day | ORAL | 1 refills | Status: DC

## 2024-01-08 MED ORDER — UNITHROID 50 MCG PO TABS: 50.0000 ug | ORAL_TABLET | Freq: Every day | ORAL | 1 refills | Status: DC

## 2024-01-08 NOTE — Progress Notes (Signed)
 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   TENDINITIS 02/02/2010   Qualifier: Diagnosis of  By: Antonetta MD, Margaret     Thyroid  disease    Phreesia 07/05/2020   Past Surgical History:  Procedure Laterality Date   Bilateral foot surgery bone removed  2000   COLONOSCOPY  11/30/2011   Procedure: COLONOSCOPY;  Surgeon: Claudis RAYMOND Rivet, MD;  [facility];  Service: Endoscopy;  Laterality: N/A;  830   COLONOSCOPY WITH PROPOFOL  N/A 04/04/2022   Procedure: COLONOSCOPY WITH PROPOFOL ;  Surgeon: Eartha Angelia Sieving, MD;  Location: [facility];  Service: Gastroenterology;  Laterality: N/A;  1:45pm, asa 3   Lumpectomy removed from thoracic spine area  1999   POLYPECTOMY  04/04/2022   Procedure: POLYPECTOMY INTESTINAL;  Surgeon: Eartha Angelia Sieving, MD;  Location: [facility];  Service: Gastroenterology;;   TOTAL HIP ARTHROPLASTY Right 10/09/2023   Procedure: ARTHROPLASTY, HIP, TOTAL, ANTERIOR APPROACH;  Surgeon: Ernie Cough, MD;  Location: [facility];  Service: Orthopedics;  Laterality: Right;   Social History   Socioeconomic History   Marital status: Divorced    Spouse name: Not on file   Number of children: 1   Years of education: Not on file   Highest education level: Bachelor's degree (e.g., BA, AB, BS)  Occupational History   Occupation: Employed    Tobacco Use   Smoking status: Never   Smokeless tobacco: Never  Vaping Use   Vaping status: Never Used  Substance and Sexual Activity   Alcohol use: No    Alcohol/week: 0.0 standard drinks of alcohol   Drug use: No   Sexual activity: Not Currently  Other Topics Concern   Not on file  Social History Narrative   Right handed   Caffeine- none   Lives alone   Social Drivers of Health   Financial Resource Strain: Low Risk  (07/02/2022)   Overall Financial Resource Strain (CARDIA)    Difficulty of Paying Living Expenses: Not hard at all  Food Insecurity: No Food Insecurity (12/04/2023)   Hunger Vital Sign    Worried About Running Out of Food in the Last Year: Never true    Ran Out of Food in the Last Year: Never true  Transportation Needs: No Transportation Needs (12/04/2023)   PRAPARE - Administrator, Civil Service (Medical): No    Lack of Transportation (Non-Medical): No  Physical Activity: Unknown (07/02/2022)   Exercise Vital Sign    Days of Exercise per Week: Patient declined    Minutes of Exercise per Session: Not on file  Stress: No Stress Concern Present (07/02/2022)   [employer] of Occupational Health - Occupational Stress Questionnaire    Feeling of Stress : Only a little  Social Connections: Moderately Integrated (12/04/2023)   Social Connection and Isolation Panel    Frequency of Communication with Friends and Family: More than three times a week    Frequency of Social Gatherings with Friends and Family: More than three times a week    Attends Religious Services: More than 4 times per year    Active Member of [employer] or Organizations: Yes    Attends Engineer, structural: More than 4 times per year    Marital Status: Divorced   Family History  Problem Relation Age of Onset   Stroke Mother    Diabetes Mother    Hypertension Mother    Coronary artery disease Father    Arthritis Other    Cancer Other    Breast cancer Maternal Aunt     Hypertension Son    Outpatient Encounter Medications as of 01/08/2024  Medication Sig   triamterene -hydrochlorothiazide  (DYAZIDE ) 37.5-25 MG capsule Take 1 capsule by mouth daily.   Ascorbic Acid (VITAMIN C PO) Take 1 tablet by mouth daily.   BLACK CURRANT SEED OIL PO Take 1 capsule by mouth daily.   Cholecalciferol (VITAMIN D ) 50 MCG (2000 UT) CAPS Take 1 capsule (2,000 Units total) by mouth daily.   CINNAMON PO Take 1 capsule by mouth daily.   donepezil  (ARICEPT ) 5 MG tablet Take 1 tablet (5 mg total) by mouth at bedtime.   LINIMENTS EX Apply 1 Application topically daily as needed (pain). Horse Liniment   potassium chloride  SA (KLOR-CON  M) 20 MEQ tablet Take 1 tablet (20 mEq total) by mouth daily with lunch for 3 days.   rosuvastatin  (CRESTOR ) 20 MG tablet Take 1 tablet (20 mg total) by mouth daily.   UNITHROID  50 MCG tablet Take 1 tablet (50 mcg total) by mouth daily before breakfast.   [DISCONTINUED] NIFEdipine  (PROCARDIA  XL/NIFEDICAL XL) 60 MG 24 hr tablet TAKE 1 TABLET BY MOUTH DAILY (Patient not taking: Reported on 12/06/2023)   [DISCONTINUED] potassium chloride  SA (KLOR-CON  M) 20 MEQ tablet Take 2 tablets (40 mEq total) by mouth daily for 3 days.   [DISCONTINUED] UNITHROID  50 MCG tablet Take 1 tablet (50 mcg total) by mouth daily before breakfast.   No facility-administered encounter medications on file as of 01/08/2024.   ALLERGIES: No Known Allergies  VACCINATION STATUS: Immunization History  Administered Date(s) Administered   Fluad Quad(high Dose 65+) 11/11/2018, 01/05/2020, 12/09/2020, 11/30/2021, 11/25/2022   INFLUENZA, HIGH DOSE SEASONAL PF 12/21/2017   Influenza Split 02/21/2011, 01/31/2012   Influenza Whole 01/23/2007, 12/21/2008, 02/02/2010   Influenza,inj,Quad PF,6+ Mos 12/24/2013, 01/18/2015, 12/01/2015, 01/18/2017   MODERNA COVID-19 SARS-COV-2 PEDS BIVALENT BOOSTER 31yr-9yr 10/14/2021   Moderna Covid-19 Vaccine Bivalent Booster 50yrs & up 11/28/2022   Moderna  SARS-COV2 Booster Vaccination 01/24/2020, 07/08/2020, 12/16/2020   Moderna Sars-Covid-2 Vaccination 04/24/2019, 05/23/2019   Pneumococcal Conjugate-13 09/22/2013   Pneumococcal Polysaccharide-23 04/03/2013   Td 04/10/2005   Tdap 06/29/2014   Zoster Recombinant(Shingrix ) 06/05/2017, 09/06/2017   Zoster, Live 04/19/2009    HPI FADUMA CHO is 78 y.o. female who returns to clinic for follow-up.  She follows up for hypothyroidism due to Hashimoto's thyroiditis.  During her last visit, she was sent to ER for confusion.  Patient with significant cognitive deficit.   She was found to have multiple electrolyte abnormalities in the hospital including hypokalemia. Even at baseline, she  is not an optimal historian.  See notes from previous visits.  She reports that she is taking her levothyroxine  50 mcg p.o. daily.  Her medication list includes  a different medication for blood pressure at this time-Triamterene -HCTZ instead of Procardia . She did not bring her medications for reconciliation.  She has seen Dr. Christi in the past.    She denies prior history of thyroidectomy or thyroid  ablation.  She still drives around.   Review of Systems  Constitutional: +mildly fluctuating body weight, no fatigue, no subjective hyperthermia, no subjective hypothermia   Objective:       01/08/2024    2:28 PM 12/05/2023    2:27 PM 12/05/2023    4:04 AM  Vitals with BMI  Height 5' 5    Weight 126 lbs 6 oz    BMI 21.03    Systolic 114 104 888  Diastolic 68 66 67  Pulse 68 72 65    BP 114/68   Pulse 68   Ht 5' 5 (1.651 m)   Wt 126 lb 6.4 oz (57.3 kg)   BMI 21.03 kg/m   Wt Readings from Last 3 Encounters:  01/08/24 126 lb 6.4 oz (57.3 kg)  12/04/23 125 lb 3.5 oz (56.8 kg)  11/15/23 128 lb (58.1 kg)    Physical Exam  Constitutional:  Body mass index is 21.03 kg/m.,  not in acute distress, patient looks better today. Eyes: PERRLA, EOMI, no exophthalmos    CMP ( most  recent) CMP     Component Value Date/Time   NA 135 12/05/2023 0418   NA 138 07/23/2023 0908   K 2.9 (L) 12/05/2023 1409   CL 100 12/05/2023 0418   CO2 25 12/05/2023 0418   GLUCOSE 75 12/05/2023 0418   BUN 19 12/05/2023 0418   BUN 19 07/23/2023 0908   CREATININE 1.26 (H) 12/05/2023 0418   CREATININE 1.03 (H) 05/07/2019 0834   CALCIUM  8.6 (L) 12/05/2023 0418   PROT 5.5 (L) 12/05/2023 0418   PROT 7.1 07/23/2023 0908   ALBUMIN 3.1 (L) 12/05/2023 0418   ALBUMIN 4.3 07/23/2023 0908   AST 34 12/05/2023 0418   ALT 26 12/05/2023 0418   ALKPHOS 64 12/05/2023 0418   BILITOT 1.1 12/05/2023 0418   BILITOT 1.0 07/23/2023 0908   EGFR 54 (L) 07/23/2023 0908   GFRNONAA 44 (L) 12/05/2023 0418   GFRNONAA 55 (L) 05/07/2019 0834     Diabetic Labs (most recent): Lab Results  Component Value Date   HGBA1C 5.8 (H) 07/23/2023   HGBA1C 5.7 (H) 01/24/2023   HGBA1C 6.0 (H) 11/07/2022     Lipid Panel ( most recent) Lipid Panel     Component Value Date/Time   CHOL 224 (H) 07/23/2023 0908   TRIG 68 07/23/2023 0908   HDL 83 07/23/2023 0908   CHOLHDL 2.7 07/23/2023 0908   CHOLHDL 2.8 05/07/2019 0834   VLDL 12 08/11/2016 0818   LDLCALC 129 (H) 07/23/2023 0908   LDLCALC 115 (H) 05/07/2019 0834   LABVLDL 12 07/23/2023 0908      Lab Results  Component Value Date   TSH 5.249 (H) 12/04/2023   TSH 2.750 06/04/2023   TSH 1.640 04/03/2023   TSH 2.500 11/07/2022   FREET4 1.01 06/04/2023   FREET4 1.33 04/03/2023     Bone density on Aug 16, 2021 [facility] Spine L1-L4 08/16/2021 73.8 Normal 2.3 1.456 g/cm2 13.2% Yes [facility] Spine L1-L4 06/13/2006 58.6 Normal 0.9 1.286 g/cm2 - -   DualFemur Total Left 08/16/2021 73.8 Normal -  0.5 0.942 g/cm2 -4.9% Yes DualFemur Total Left 08/21/2018 70.8 Normal -0.1 0.991 g/cm2 1.7% -   DualFemur Total Mean 08/16/2021 73.8 Normal 0.0 1.004 g/cm2 -4.9% Yes DualFemur Total Mean 08/21/2018 70.8 - - 1.056 g/cm2 0.8% -    Left Forearm Radius 33% 08/16/2021 73.8 Normal  -0.3 0.689 g/cm2 -3.1% -  ASSESSMENT: The BMD measured at Femur Total Left is 0.942 g/cm2 with a T-score of -0.5. This patient is considered normal according to World Health Organization [facility]) criteria. The scan quality is good. Compared with the prior study on 08/21/2018, the BMD of the total mean shows a statistically significant decrease.  Recent Results (from the past 2160 hours)  CBC     Status: Abnormal   Collection Time: 10/11/23  3:38 AM  Result Value Ref Range   WBC 16.1 (H) 4.0 - 10.5 K/uL   RBC 3.78 (L) 3.87 - 5.11 MIL/uL   Hemoglobin 10.2 (L) 12.0 - 15.0 g/dL   HCT 69.6 (L) 63.9 - 53.9 %   MCV 80.2 80.0 - 100.0 fL   MCH 27.0 26.0 - 34.0 pg   MCHC 33.7 30.0 - 36.0 g/dL   RDW 85.9 88.4 - 84.4 %   Platelets 278 150 - 400 K/uL   nRBC 0.0 0.0 - 0.2 %    Comment: Performed at [facility], [address] [place], [place] [address]  ATN PROFILE     Status: Abnormal   Collection Time: 11/15/23 11:21 AM  Result Value Ref Range   A -- Beta-amyloid 42/40 Ratio 0.101 (L) >0.102   Beta-amyloid 42 26.52 pg/mL   Beta-amyloid 40 262.51 pg/mL   T -- p-tau181 4.66 (H) 0.00 - 0.97 pg/mL   N -- NfL, Plasma 5.57 0.00 - 6.04 pg/mL   ATN SUMMARY Comment     Comment:                        A+ T+ N- A low beta-amyloid 42/40 and a high pTau181 concentration were observed. A normal NfL concentration was observed at this time. 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 Plasma findings may be less precise than CSF or PET. Additional assessments may be necessary. These tests are intended to be used in the context of clinical care.    Information: Comment     Comment: Beta-amyloid 42 and Beta-amyloid 40: Plasma beta-amyloid 1-42/1-40 ratios less than or equal to 0.102 suggest a higher probability of a patient being clinically diagnosed with Alzheimer's Disease (AD), while values above 0.102 suggest a lower probability of AD  diagnosis. Precise plasma testing of Beta-amyloid 42 and Beta-amyloid 40 has demonstrated comparable effectiveness to traditional cerebrospinal fluid testing and amyloid positron emission tomography (PET) scans. When assessing the risk of AD pathology as the underlying cause for mild cognitive impairment (MCI) or dementia, it is important to consider various factors such as medical and family history, nutritional deficiency biomarkers, neuroimaging, and physical, neurological, and neuropsychological examinations. These tests were developed and their performance characteristics determined by [facility]. They have not been cleared or approved by the Food and Drug Administration.                                                                    *  METHODOLOGY: Beta-amyloid 42/40 Ratio: Sysmex Chemiluminescence Enzyme Immunoassay (CLEIA) NfL and p-tau181: Tests performed by Roche Diagnostics Electrochemiluminescence Immunoassay (ECLIA). Values obtained with different methods cannot be used interchangeable. These tests were developed and their performance characteristics determined by [facility]. They have not been cleared or approved by the Food and Drug Administration.                                                                    * p-tau181 INFORMATION: For individual 61-37 years of age:  0.00-0.95 pg/mL Reference interval is based on a population of ostensibly healthy individuals aged 52 to 1 years For individual greater than 32 years of age:  0.00-0.97 pg/mL Results greater than the clinical cut-off of 0.97 pg/mL in patients greater than 34 years of age are correlated with Abeta amyloid pathology as determined by amyloid PET imaging.                                                                     * 1. Interpretation comments are based on a consensus between [facility] for Age and the Internation Working Group recommendations for ATN panel interpretation published by Marinell dunker al 2018 and updated in Prosser et al 2021.                                                                    *  Hampel H, Cummings J, Blennow K, Lia SHAUNNA Marinell [address], [place] A. Developing the ATX(N) classification for use across the Alzheimer disease continiuum. Nat Rev Neurol. 2021 Sep;17(9):580-589.  Marinell CR Mickey Dolly DA, Blennow K, et al. A/T/N: An unbiased descriptive classification scheme for Alzheimer disease biomarkers. Neurology. 2016 Aug 2;87(5):539-547.   CBC with Differential     Status: Abnormal   Collection Time: 12/04/23 11:36 AM  Result Value Ref Range   WBC 6.3 4.0 - 10.5 K/uL   RBC 6.08 (H) 3.87 - 5.11 MIL/uL   Hemoglobin 16.2 (H) 12.0 - 15.0 g/dL   HCT 52.7 (H) 63.9 - 53.9 %   MCV 77.6 (L) 80.0 - 100.0 fL   MCH 26.6 26.0 - 34.0 pg   MCHC 34.3 30.0 - 36.0 g/dL   RDW 86.6 88.4 - 84.4 %   Platelets 358 150 - 400 K/uL   nRBC 0.0 0.0 - 0.2 %   Neutrophils Relative % 68 %   Neutro Abs 4.3 1.7 - 7.7 K/uL   Lymphocytes Relative 19 %   Lymphs Abs 1.2 0.7 - 4.0 K/uL   Monocytes Relative 12 %   Monocytes Absolute 0.8 0.1 - 1.0 K/uL   Eosinophils Relative 0 %   Eosinophils Absolute 0.0 0.0 - 0.5 K/uL   Basophils Relative 0 %   Basophils Absolute 0.0 0.0 - 0.1 K/uL  Immature Granulocytes 1 %   Abs Immature Granulocytes 0.04 0.00 - 0.07 K/uL    Comment: Performed at [facility], [address] [place], [place] [address]  Comprehensive metabolic panel     Status: Abnormal   Collection Time: 12/04/23 11:36 AM  Result Value Ref Range   Sodium 132 (L) 135 - 145 mmol/L   Potassium 2.4 (LL) 3.5 - 5.1 mmol/L    Comment: CRITICAL RESULT CALLED TO, READ BACK BY AND VERIFIED WITH  TALBOTT,T  AT 12:50PM ON 12/04/23 BY FESTERMAN,C   Chloride 82 (L) 98 - 111 mmol/L   CO2 26 22 - 32 mmol/L   Glucose, Bld 90 70 - 99 mg/dL    Comment: Glucose reference range applies only to samples taken after fasting for at least 8 hours.   BUN 27 (H) 8 - 23 mg/dL   Creatinine, Ser 8.27 (H)  0.44 - 1.00 mg/dL   Calcium  10.9 (H) 8.9 - 10.3 mg/dL   Total Protein 8.4 (H) 6.5 - 8.1 g/dL   Albumin 4.6 3.5 - 5.0 g/dL   AST 49 (H) 15 - 41 U/L   ALT 39 0 - 44 U/L   Alkaline Phosphatase 103 38 - 126 U/L   Total Bilirubin 2.2 (H) 0.0 - 1.2 mg/dL   GFR, Estimated 30 (L) >60 mL/min    Comment: (NOTE) Calculated using the CKD-EPI Creatinine Equation (2021)    Anion gap 24 (H) 5 - 15    Comment: ELECTROLYTES REPEATED TO VERIFY Performed at [facility], [address], [place], [place] [address]   Lactic acid, plasma     Status: Abnormal   Collection Time: 12/04/23 11:36 AM  Result Value Ref Range   Lactic Acid, Venous 3.2 (HH) 0.5 - 1.9 mmol/L    Comment: CRITICAL RESULT CALLED TO, READ BACK BY AND VERIFIED WITH  TALOBOTT,T  AT 12:50PM ON 12/04/23 BY [facility] Performed at [facility], [address] [place], [place] [address]   TSH     Status: Abnormal   Collection Time: 12/04/23 11:36 AM  Result Value Ref Range   TSH 5.249 (H) 0.350 - 4.500 uIU/mL    Comment: Performed by a 3rd Generation assay with a functional sensitivity of <=0.01 uIU/mL. Performed at [facility], [address], [place], [place] [address]   Magnesium      Status: Abnormal   Collection Time: 12/04/23 11:36 AM  Result Value Ref Range   Magnesium  3.0 (H) 1.7 - 2.4 mg/dL    Comment: Performed at [facility], [address] [place] [address] [place], [place] [address]  Calcium , ionized     Status: None   Collection Time: 12/04/23 11:36 AM  Result Value Ref Range   Calcium , Ionized, Serum 5.2 4.5 - 5.6 mg/dL    Comment: (NOTE) Performed At: [facility] [address] [place], [place] [id number] Jennette Shorter MD Ey:[id number]   CBG monitoring, ED     Status: Abnormal   Collection Time: 12/04/23  2:22 PM  Result Value Ref Range   Glucose-Capillary 61 (L) 70 - 99 mg/dL    Comment: Glucose reference range applies only to samples taken after fasting for at least 8 hours.  Urinalysis, Routine w reflex microscopic  -Urine, Clean Catch     Status: Abnormal   Collection Time: 12/04/23  3:10 PM  Result Value Ref Range   Color, Urine YELLOW YELLOW   APPearance CLOUDY (A) CLEAR   Specific Gravity, Urine 1.011 1.005 - 1.030   pH 7.0 5.0 - 8.0  Glucose, UA NEGATIVE NEGATIVE mg/dL   Hgb urine dipstick MODERATE (A) NEGATIVE   Bilirubin Urine NEGATIVE NEGATIVE   Ketones, ur 5 (A) NEGATIVE mg/dL   Protein, ur 30 (A) NEGATIVE mg/dL   Nitrite NEGATIVE NEGATIVE   Leukocytes,Ua MODERATE (A) NEGATIVE   RBC / HPF 0-5 0 - 5 RBC/hpf   WBC, UA 6-10 0 - 5 WBC/hpf   Bacteria, UA RARE (A) NONE SEEN   Squamous Epithelial / HPF 21-50 0 - 5 /HPF   Mucus PRESENT     Comment: Performed at [facility], [address], [place], [place] [address]  Lactic acid, plasma     Status: None   Collection Time: 12/04/23  3:26 PM  Result Value Ref Range   Lactic Acid, Venous 1.9 0.5 - 1.9 mmol/L    Comment: Performed at [facility], [address], [place], [place] [address]  CBC     Status: Abnormal   Collection Time: 12/05/23  4:18 AM  Result Value Ref Range   WBC 6.5 4.0 - 10.5 K/uL   RBC 4.30 3.87 - 5.11 MIL/uL   Hemoglobin 11.5 (L) 12.0 - 15.0 g/dL    Comment: REPEATED TO VERIFY DELTA CHECK NOTED    HCT 34.3 (L) 36.0 - 46.0 %   MCV 79.8 (L) 80.0 - 100.0 fL   MCH 26.7 26.0 - 34.0 pg   MCHC 33.5 30.0 - 36.0 g/dL   RDW 86.1 88.4 - 84.4 %   Platelets 310 150 - 400 K/uL   nRBC 0.0 0.0 - 0.2 %    Comment: Performed at [facility], [address] [place], [place] [address]  Comprehensive metabolic panel with GFR     Status: Abnormal   Collection Time: 12/05/23  4:18 AM  Result Value Ref Range   Sodium 135 135 - 145 mmol/L   Potassium 2.8 (L) 3.5 - 5.1 mmol/L   Chloride 100 98 - 111 mmol/L   CO2 25 22 - 32 mmol/L   Glucose, Bld 75 70 - 99 mg/dL    Comment: Glucose reference range applies only to samples taken after fasting for at least 8 hours.   BUN 19 8 - 23 mg/dL   Creatinine, Ser 8.73 (H) 0.44 - 1.00 mg/dL    Calcium  8.6 (L) 8.9 - 10.3 mg/dL    Comment: DELTA CHECK NOTED   Total Protein 5.5 (L) 6.5 - 8.1 g/dL   Albumin 3.1 (L) 3.5 - 5.0 g/dL   AST 34 15 - 41 U/L   ALT 26 0 - 44 U/L   Alkaline Phosphatase 64 38 - 126 U/L   Total Bilirubin 1.1 0.0 - 1.2 mg/dL   GFR, Estimated 44 (L) >60 mL/min    Comment: (NOTE) Calculated using the CKD-EPI Creatinine Equation (2021)    Anion gap 10 5 - 15    Comment: Performed at [facility], [address], [place], [place] [address]  Potassium     Status: Abnormal   Collection Time: 12/05/23  2:09 PM  Result Value Ref Range   Potassium 2.9 (L) 3.5 - 5.1 mmol/L    Comment: Performed at [facility], [address] [place], [place] [address]     Assessment & Plan:   1. Hypothyroidism-Hashimoto's thyroiditis 2.  Hypokalemia   3.  Prediabetes 4.  Hypercalcemia  Patient was advised to continue Unithroid  50 mcg p.o. daily before breakfast.  Her most recent labs show TSH of 5.24 consistent with under replacement.  After she finishes her current  supply of Unithroid  50 mcg, her next refill will be 75 mcg.  - We discussed about the correct intake of her thyroid  hormone, on empty stomach at fasting, with water , separated by at least 30 minutes from breakfast and other medications,  and separated by more than 4 hours from calcium , iron, multivitamins, acid reflux medications (PPIs). -Patient is made aware of the fact that thyroid  hormone replacement is needed for life, dose to be adjusted by periodic monitoring of thyroid  function tests.  Her recent labs showed resolution of her previous mild hypercalcemia.  She would not need intervention at this time. Her previous 24-hour urine calcium  was only 14 mg / 24 hours.  She is not a surgical candidate for hypercalcemia.  FHH is not ruled out.  She will be continued on expectant management.   In light of her recent intake of hypokalemia, she is advised to continue potassium 20 mEq p.o. daily average.  I advised her to be  consistent with her blood pressure medication triamterene -HCTZ 37.5-25 mg p.o. once a day.  She is encouraged to bring all of her medications during her next visit for reconciliation. She will not need intervention for prediabetes at this time. She is advised to maintain close follow-up with her PMD.   she is advised to maintain close follow up with Antonetta Rollene BRAVO, MD for primary care needs.  I spent  25  minutes in the care of the patient today including review of labs from Thyroid  Function, CMP, and other relevant labs ; imaging/biopsy records (current and previous including abstractions from other facilities); face-to-face time discussing  her lab results and symptoms, medications doses, her options of short and long term treatment based on the latest standards of care / guidelines;   and documenting the encounter.  Christina Carroll  participated in the discussions, expressed understanding, and voiced agreement with the above plans.  All questions were answered to her satisfaction. she is encouraged to contact clinic should she have any questions or concerns prior to her return visit.  -  This note was partially dictated with voice recognition software. Similar sounding words can be transcribed inadequately or may not  be corrected upon review.   Follow up plan: Return in about 3 months (around 04/09/2024) for F/U with Pre-visit Labs.   Ranny Earl, MD [facility] [address] [place], [place] [address] Phone: [phone]  Fax: [phone]     01/08/2024, 3:14 PM  This note was partially dictated with voice recognition software. Similar sounding words can be transcribed inadequately or may not  be corrected upon review.

## 2024-01-21 NOTE — Telephone Encounter (Signed)
 Called patient back scheduled with 11/12 at 9:20 am

## 2024-01-21 NOTE — Telephone Encounter (Signed)
 Scheduled 12/18 at 240 pm

## 2024-01-24 ENCOUNTER — Other Ambulatory Visit: Payer: Self-pay

## 2024-01-24 DIAGNOSIS — I10 Essential (primary) hypertension: Secondary | ICD-10-CM

## 2024-01-24 DIAGNOSIS — E876 Hypokalemia: Secondary | ICD-10-CM

## 2024-01-25 ENCOUNTER — Encounter: Payer: Self-pay | Admitting: Family Medicine

## 2024-01-25 ENCOUNTER — Ambulatory Visit (INDEPENDENT_AMBULATORY_CARE_PROVIDER_SITE_OTHER): Admitting: Family Medicine

## 2024-01-25 VITALS — BP 128/80 | HR 70 | Resp 12 | Ht 65.0 in | Wt 127.0 lb

## 2024-01-25 DIAGNOSIS — G3184 Mild cognitive impairment, so stated: Secondary | ICD-10-CM

## 2024-01-25 DIAGNOSIS — E876 Hypokalemia: Secondary | ICD-10-CM

## 2024-01-25 DIAGNOSIS — E038 Other specified hypothyroidism: Secondary | ICD-10-CM | POA: Diagnosis not present

## 2024-01-25 DIAGNOSIS — I10 Essential (primary) hypertension: Secondary | ICD-10-CM | POA: Diagnosis not present

## 2024-01-25 DIAGNOSIS — Z23 Encounter for immunization: Secondary | ICD-10-CM

## 2024-01-25 DIAGNOSIS — R634 Abnormal weight loss: Secondary | ICD-10-CM

## 2024-01-25 MED ORDER — NIFEDIPINE ER OSMOTIC RELEASE 60 MG PO TB24: 60.0000 mg | ORAL_TABLET | Freq: Every day | ORAL | 3 refills | Status: AC

## 2024-01-25 NOTE — Patient Instructions (Addendum)
 F/U in 12 weeks   Please schedule wellness visit at checkout this is past due  Flu vaccine today  Pals get covid vaccine at your pharmacy next week  Labs today, cbc, bmp and TSH and free t4 as ordered today  Increase frequency and quantity of food eaten please, gaining 5 to 8 pounds will be good  Glad you are much better since hip surgery  Thanks for choosing [facility], we consider it a privelige to serve you.

## 2024-01-25 NOTE — Assessment & Plan Note (Addendum)
 Controlled, no change in medication DASH diet and commitment to daily physical activity for a minimum of 30 minutes discussed and encouraged, as a part of hypertension management. The importance of attaining a healthy weight is also discussed.     01/25/2024    2:24 PM 01/08/2024    2:28 PM 12/05/2023    2:27 PM 12/05/2023    4:04 AM 12/05/2023   12:01 AM 12/04/2023    8:27 PM 12/04/2023    8:22 PM  BP/Weight  Systolic BP 128 114 104 111 94  127  Diastolic BP 80 68 66 67 58  66  Wt. (Lbs) 127 126.4    125.22   BMI 21.13 kg/m2 21.03 kg/m2    20.84 kg/m2

## 2024-01-25 NOTE — Assessment & Plan Note (Signed)
 Updated lab needed at/ before next visit.

## 2024-01-25 NOTE — Assessment & Plan Note (Signed)
 After obtaining informed consent, the vaccine is  administered , with no adverse effect noted at the time of administration.

## 2024-01-26 ENCOUNTER — Ambulatory Visit: Payer: Self-pay | Admitting: Family Medicine

## 2024-01-26 LAB — BMP8+EGFR
BUN/Creatinine Ratio: 15 (ref 12–28)
BUN: 16 mg/dL (ref 8–27)
CO2: 26 mmol/L (ref 20–29)
Calcium: 10.4 mg/dL — ABNORMAL HIGH (ref 8.7–10.3)
Chloride: 99 mmol/L (ref 96–106)
Creatinine, Ser: 1.08 mg/dL — ABNORMAL HIGH (ref 0.57–1.00)
Glucose: 93 mg/dL (ref 70–99)
Potassium: 4.2 mmol/L (ref 3.5–5.2)
Sodium: 140 mmol/L (ref 134–144)
eGFR: 53 mL/min/1.73 — ABNORMAL LOW (ref >59)

## 2024-01-26 LAB — CBC
Hematocrit: 39 % (ref 34.0–46.6)
Hemoglobin: 12.4 g/dL (ref 11.1–15.9)
MCH: 26.5 pg — ABNORMAL LOW (ref 26.6–33.0)
MCHC: 31.8 g/dL (ref 31.5–35.7)
MCV: 83 fL (ref 79–97)
Platelets: 359 x10E3/uL (ref 150–450)
RBC: 4.68 x10E6/uL (ref 3.77–5.28)
RDW: 15.3 % (ref 11.7–15.4)
WBC: 5.7 x10E3/uL (ref 3.4–10.8)

## 2024-01-26 LAB — TSH+FREE T4
Free T4: 0.87 ng/dL (ref 0.82–1.77)
TSH: 2.97 u[IU]/mL (ref 0.450–4.500)

## 2024-01-30 ENCOUNTER — Ambulatory Visit: Admitting: Family Medicine

## 2024-02-06 ENCOUNTER — Telehealth: Payer: Self-pay | Admitting: Family Medicine

## 2024-02-06 NOTE — Telephone Encounter (Signed)
Pt informed

## 2024-02-06 NOTE — Telephone Encounter (Signed)
 Patient came into office, nurse asked to send a epic message. Asking okay to take Neurocept medicine supplement.  [phone]

## 2024-02-11 DIAGNOSIS — G3184 Mild cognitive impairment, so stated: Secondary | ICD-10-CM | POA: Insufficient documentation

## 2024-02-11 NOTE — Progress Notes (Signed)
   Christina Carroll     MRN: [id number]      DOB: 1945/08/21  Chief Complaint  Patient presents with   Hospitalization Follow-up    HPI Ms. Christina Carroll is here for follow up of hospitalization from 9/16 to 12/05/2023 when she admitted with dehydration and confusion,she was lost to in office follow up and is here today Reports improvement in oral intake and less confusion She also had her blood pressure meds held during her recent admission and needs re evaluation of antihypertensive needs Recovering very well from recent hip replacement, denies pain and reports improved ambulation ROS Denies recent fever or chills. Denies sinus pressure, nasal congestion, ear pain or sore throat. Denies chest congestion, productive cough or wheezing. Denies chest pains, palpitations and leg swelling Denies abdominal pain, nausea, vomiting,diarrhea or constipation.   Denies dysuria, frequency, hesitancy or incontinence. . Denies skin break down or rash.   PE  BP 128/80   Pulse 70   Resp 12   Ht 5' 5 (1.651 m)   Wt 127 lb (57.6 kg)   SpO2 99%   BMI 21.13 kg/m   Patient alert  and in no cardiopulmonary distress.  HEENT: No facial asymmetry, EOMI,     Neck supple .  Chest: Clear to auscultation bilaterally.  CVS: S1, S2 no murmurs, no S3.Regular rate.  ABD: Soft non tender.   Ext: No edema  MS: Adequate ROM spine, shoulders, hips and knees.  Skin: Intact, no ulcerations or rash noted.  Psych: Good eye contact, normal affect.  not anxious or depressed appearing.  CNS: CN 2-12 intact, power,  normal throughout.no focal deficits noted.   Assessment & Plan  Hypothyroid Updated lab needed at/ before next visit.   Essential hypertension, benign Controlled, no change in medication DASH diet and commitment to daily physical activity for a minimum of 30 minutes discussed and encouraged, as a part of hypertension management. The importance of attaining a healthy weight is also  discussed.     01/25/2024    2:24 PM 01/08/2024    2:28 PM 12/05/2023    2:27 PM 12/05/2023    4:04 AM 12/05/2023   12:01 AM 12/04/2023    8:27 PM 12/04/2023    8:22 PM  BP/Weight  Systolic BP 128 114 104 111 94  127  Diastolic BP 80 68 66 67 58  66  Wt. (Lbs) 127 126.4    125.22   BMI 21.13 kg/m2 21.03 kg/m2    20.84 kg/m2        Influenza vaccine needed After obtaining informed consent, the vaccine is  administered , with no adverse effect noted at the time of administration.   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

## 2024-02-11 NOTE — Assessment & Plan Note (Signed)
 New dx in 2025 , being managed by Neurology, currently on Aricept 

## 2024-02-11 NOTE — Assessment & Plan Note (Addendum)
 Updated lab needed, had been advised to get lab 1 day prior to visit but has not followed through, review of lab drawn on day of visit , potassium is normal

## 2024-02-11 NOTE — Assessment & Plan Note (Signed)
 Encpouraged more frequent small meals so that weight gain can occur a

## 2024-03-06 ENCOUNTER — Ambulatory Visit: Admitting: Family Medicine

## 2024-03-21 ENCOUNTER — Ambulatory Visit: Payer: Self-pay

## 2024-03-24 ENCOUNTER — Ambulatory Visit (HOSPITAL_COMMUNITY)

## 2024-03-28 ENCOUNTER — Ambulatory Visit

## 2024-03-28 VITALS — BP 123/79 | HR 61 | Resp 14 | Ht 65.0 in | Wt 135.1 lb

## 2024-03-28 DIAGNOSIS — Z780 Asymptomatic menopausal state: Secondary | ICD-10-CM

## 2024-03-28 DIAGNOSIS — Z0000 Encounter for general adult medical examination without abnormal findings: Secondary | ICD-10-CM

## 2024-03-28 NOTE — Progress Notes (Signed)
 "  Chief Complaint  Patient presents with   Medicare Wellness    HM Addressed: DEXA ordered Subjective:   Christina Carroll is a 79 y.o. female who presents for a Medicare Annual Wellness Visit.  Visit info / Clinical Intake: Medicare Wellness Visit Type:: Subsequent Annual Wellness Visit Persons participating in visit and providing information:: patient Medicare Wellness Visit Mode:: In-person (required for WTM) Interpreter Needed?: No Pre-visit prep was completed: yes AWV questionnaire completed by patient prior to visit?: no Living arrangements:: (!) lives alone Patient's Overall Health Status Rating: very good Typical amount of pain: some Does pain affect daily life?: no Are you currently prescribed opioids?: no  Dietary Habits and Nutritional Risks How many meals a day?: 3 Eats fruit and vegetables daily?: yes Most meals are obtained by: preparing own meals In the last 2 weeks, have you had any of the following?: none Diabetic:: no  Functional Status Activities of Daily Living (to include ambulation/medication): Independent Ambulation: Independent Medication Administration: Independent Home Management (perform basic housework or laundry): Independent Manage your own finances?: yes Primary transportation is: driving Concerns about vision?: no *vision screening is required for WTM* Concerns about hearing?: no  Fall Screening Falls in the past year?: 1 Number of falls in past year: 1 Was there an injury with Fall?: 0 Fall Risk Category Calculator: 2 Patient Fall Risk Level: Moderate Fall Risk  Fall Risk Patient at Risk for Falls Due to: History of fall(s); Orthopedic patient Fall risk Follow up: Falls evaluation completed; Education provided; Falls prevention discussed  Home and Transportation Safety: All rugs have non-skid backing?: yes All stairs or steps have railings?: (!) no Grab bars in the bathtub or shower?: (!) no Have non-skid surface in bathtub  or shower?: yes Good home lighting?: yes Regular seat belt use?: yes Hospital stays in the last year:: (!) yes How many hospital stays:: 1 Reason: hip replacement surgery  Cognitive Assessment Difficulty concentrating, remembering, or making decisions? : yes Will 6CIT or Mini Cog be Completed: yes What year is it?: 0 points What month is it?: 0 points Give patient an address phrase to remember (5 components): [address] [facility] [place] About what time is it?: 0 points Count backwards from 20 to 1: 0 points Say the months of the year in reverse: 0 points Repeat the address phrase from earlier: 2 points 6 CIT Score: 2 points  Advance Directives (For Healthcare) Does Patient Have a Medical Advance Directive?: Yes Does patient want to make changes to medical advance directive?: No - Patient declined Type of Advance Directive: Healthcare Power of Attorney Copy of Healthcare Power of Attorney in Chart?: No - copy requested  Reviewed/Updated  Reviewed/Updated: Reviewed All (Medical, Surgical, Family, Medications, Allergies, Care Teams, Patient Goals)    Allergies (verified) Patient has no known allergies.   Current Medications (verified) Outpatient Encounter Medications as of 03/28/2024  Medication Sig   Ascorbic Acid (VITAMIN C PO) Take 1 tablet by mouth daily.   BLACK CURRANT SEED OIL PO Take 1 capsule by mouth daily.   Cholecalciferol (VITAMIN D ) 50 MCG (2000 UT) CAPS Take 1 capsule (2,000 Units total) by mouth daily.   CINNAMON PO Take 1 capsule by mouth daily.   donepezil  (ARICEPT ) 5 MG tablet Take 1 tablet (5 mg total) by mouth at bedtime.   LINIMENTS EX Apply 1 Application topically daily as needed (pain). Horse Liniment   NIFEdipine  (PROCARDIA  XL) 60 MG 24 hr tablet Take 1 tablet (60 mg total) by mouth  daily.   potassium chloride  SA (KLOR-CON  M) 20 MEQ tablet Take 1 tablet (20 mEq total) by mouth daily with lunch for 3 days.   rosuvastatin  (CRESTOR ) 20 MG tablet Take 1 tablet  (20 mg total) by mouth daily.   triamterene -hydrochlorothiazide  (DYAZIDE ) 37.5-25 MG capsule Take 1 capsule by mouth daily.   UNITHROID  75 MCG tablet Take 1 tablet (75 mcg total) by mouth daily before breakfast.   No facility-administered encounter medications on file as of 03/28/2024.    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   TENDINITIS 02/02/2010   Qualifier: Diagnosis of  By: Antonetta MD, Margaret     Thyroid  disease    Phreesia 07/05/2020   Past Surgical History:  Procedure Laterality Date   Bilateral foot surgery bone removed  2000   COLONOSCOPY  11/30/2011   Procedure: COLONOSCOPY;  Surgeon: Claudis RAYMOND Rivet, MD;  Location: [facility];  Service: Endoscopy;  Laterality: N/A;  830   COLONOSCOPY WITH PROPOFOL  N/A 04/04/2022   Procedure: COLONOSCOPY WITH PROPOFOL ;  Surgeon: Eartha Angelia Sieving, MD;  Location: [facility];  Service: Gastroenterology;  Laterality: N/A;  1:45pm, asa 3   Lumpectomy removed from thoracic spine area  1999   POLYPECTOMY  04/04/2022   Procedure: POLYPECTOMY INTESTINAL;  Surgeon: Eartha Angelia Sieving, MD;  Location: [facility];  Service: Gastroenterology;;   TOTAL HIP ARTHROPLASTY Right 10/09/2023   Procedure: ARTHROPLASTY, HIP, TOTAL, ANTERIOR APPROACH;  Surgeon: Ernie Cough, MD;  Location: [facility];  Service: Orthopedics;  Laterality: Right;   Family History  Problem Relation Age of Onset   Stroke Mother    Diabetes Mother    Hypertension Mother    Coronary artery disease Father    Arthritis Other    Cancer Other    Breast  cancer Maternal Aunt    Hypertension Son    Social History   Occupational History   Occupation: Employed   Tobacco Use   Smoking status: Never   Smokeless tobacco: Never  Vaping Use   Vaping status: Never Used  Substance and Sexual Activity   Alcohol use: No    Alcohol/week: 0.0 standard drinks of alcohol   Drug use: No   Sexual activity: Not Currently   Tobacco Counseling Counseling given: Yes  SDOH Screenings   Food Insecurity: No Food Insecurity (03/28/2024)  Housing: Low Risk (03/28/2024)  Transportation Needs: No Transportation Needs (03/28/2024)  Utilities: Not At Risk (03/28/2024)  Depression (PHQ2-9): Low Risk (03/28/2024)  Financial Resource Strain: Low Risk (07/02/2022)  Physical Activity: Patient Declined (03/28/2024)  Social Connections: Moderately Integrated (03/28/2024)  Stress: No Stress Concern Present (03/28/2024)  Tobacco Use: Low Risk (03/28/2024)  Health Literacy: Adequate Health Literacy (03/28/2024)   See flowsheets for full screening details  Depression Screen PHQ 2 & 9 Depression Scale- Over the past 2 weeks, how often have you been bothered by any of the following problems? Little interest or pleasure in doing things: 0 Feeling down, depressed, or hopeless (PHQ Adolescent also includes...irritable): 0 PHQ-2  Total Score: 0 Trouble falling or staying asleep, or sleeping too much: 0 Feeling tired or having little energy: 0 Poor appetite or overeating (PHQ Adolescent also includes...weight loss): 0 Feeling bad about yourself - or that you are a failure or have let yourself or your family down: 0 Trouble concentrating on things, such as reading the newspaper or watching television (PHQ Adolescent also includes...like school work): 0 Moving or speaking so slowly that other people could have noticed. Or the opposite - being so fidgety or restless that you have been moving around a lot more than usual: 0 Thoughts that you would be better off dead, or of hurting yourself in  some way: 0 PHQ-9 Total Score: 0 If you checked off any problems, how difficult have these problems made it for you to do your work, take care of things at home, or get along with other people?: Not difficult at all     Goals Addressed             This Visit's Progress    Exercise 3x per week (30 min per time)   On track    Patient would like to start exercising 3 times a week for at least 30 minutes at a time.      I want to continue to eat right and maintain my weight loss       Patient Stated   On track    Patient states that her goals in to live long enough to watch her grand baby grow up and to be as healthy as she can.             Objective:    Today's Vitals   03/28/24 1350  BP: 123/79  Pulse: 61  Resp: 14  SpO2: 99%  Weight: 135 lb 1.3 oz (61.3 kg)  Height: 5' 5 (1.651 m)   Body mass index is 22.48 kg/m.  Hearing/Vision screen Hearing Screening - Comments:: Patient denies any hearing difficulties.   Vision Screening - Comments:: Patient is not up to date on eye exams. No vision concerns  Immunizations and Health Maintenance Health Maintenance  Topic Date Due   Medicare Annual Wellness (AWV)  07/03/2023   Bone Density Scan  08/17/2023   COVID-19 Vaccine (8 - 2025-26 season) 11/19/2023   Mammogram  03/11/2024   DTaP/Tdap/Td (3 - Td or Tdap) 06/28/2024   Pneumococcal Vaccine: 50+ Years  Completed   Influenza Vaccine  Completed   Hepatitis C Screening  Completed   Zoster Vaccines- Shingrix   Completed   Meningococcal B Vaccine  Aged Out   Colonoscopy  Discontinued   Fecal DNA (Cologuard)  Discontinued        Assessment/Plan:  This is a routine wellness examination for Christina Carroll.  Patient Care Team: Antonetta Rollene BRAVO, MD as PCP - General Morayati, Vannie PARAS, MD as Attending Physician (Endocrinology) Lenis Ethelle ORN, MD as Consulting Physician (Endocrinology) Gregg Lek, MD as Consulting Physician (Neurology) Ernie Cough, MD as  Consulting Physician (Orthopedic Surgery) Librada Lauraine POUR, AUD (Audiology)  I have personally reviewed and noted the following in the patients chart:   Medical and social history Use of alcohol, tobacco or illicit drugs  Current medications and supplements including opioid prescriptions. Functional ability and status Nutritional status Physical activity Advanced directives List of other physicians Hospitalizations, surgeries, and ER visits in previous 12 months Vitals Screenings to include cognitive, depression, and falls Referrals and appointments  Orders Placed This Encounter  Procedures   DG Bone Density  Standing Status:   Future    Expiration Date:   03/28/2025    Reason for Exam (SYMPTOM  OR DIAGNOSIS REQUIRED):   post menopausal estrogen deficient    Preferred imaging location?:   [facility]   In addition, I have reviewed and discussed with patient certain preventive protocols, quality metrics, and best practice recommendations. A written personalized care plan for preventive services as well as general preventive health recommendations were provided to patient.   Lian Pounds, CMA   03/28/2024   Return March 31, 2025 at 9:20 am, for In office Medicare Well Visit w  Wellness Nurse.  After Visit Summary: (In Person-Printed) AVS printed and given to the patient   "

## 2024-03-28 NOTE — Patient Instructions (Signed)
 Christina Carroll,  Thank you for taking the time for your Medicare Wellness Visit. I appreciate your continued commitment to your health goals. Please review the care plan we discussed, and feel free to reach out if I can assist you further.  Please note that Annual Wellness Visits do not include a physical exam. Some assessments may be limited, especially if the visit was conducted virtually. If needed, we may recommend an in-person follow-up with your provider.  Ongoing Care Seeing your primary care provider every 3 to 6 months helps us  monitor your health and provide consistent, personalized care.   1 year follow up for Medicare well visit: March 31, 2025 at 9:20 am with medicare wellness nurse in office  Referrals If a referral was made during today's visit and you haven't received any updates within two weeks, please contact the referred provider directly to check on the status.  Mammogram/Bone Density Screening: Call [facility] @ Phone: [phone]   Recommended Screenings:  Health Maintenance  Topic Date Due   Medicare Annual Wellness Visit  07/03/2023   Osteoporosis screening with Bone Density Scan  08/17/2023   COVID-19 Vaccine (8 - 2025-26 season) 11/19/2023   Breast Cancer Screening  03/11/2024   DTaP/Tdap/Td vaccine (3 - Td or Tdap) 06/28/2024   Pneumococcal Vaccine for age over 36  Completed   Flu Shot  Completed   Hepatitis C Screening  Completed   Zoster (Shingles) Vaccine  Completed   Meningitis B Vaccine  Aged Out   Colon Cancer Screening  Discontinued   Cologuard (Stool DNA test)  Discontinued       12/04/2023    4:19 PM  Advanced Directives  Does Patient Have a Medical Advance Directive? Yes  Type of Advance Directive Healthcare Power of Attorney  Does patient want to make changes to medical advance directive? No - Patient declined  Copy of Healthcare Power of Attorney in Chart? No - copy requested    Vision: Annual vision screenings are  recommended for early detection of glaucoma, cataracts, and diabetic retinopathy. These exams can also reveal signs of chronic conditions such as diabetes and high blood pressure.  Dental: Annual dental screenings help detect early signs of oral cancer, gum disease, and other conditions linked to overall health, including heart disease and diabetes.  Please see the attached documents for additional preventive care recommendations.

## 2024-04-04 ENCOUNTER — Inpatient Hospital Stay (HOSPITAL_COMMUNITY): Admission: RE | Admit: 2024-04-04

## 2024-04-04 ENCOUNTER — Other Ambulatory Visit (HOSPITAL_COMMUNITY)

## 2024-04-04 ENCOUNTER — Ambulatory Visit (HOSPITAL_COMMUNITY): Admission: RE | Admit: 2024-04-04 | Source: Ambulatory Visit

## 2024-04-04 ENCOUNTER — Ambulatory Visit (HOSPITAL_COMMUNITY)
Admission: RE | Admit: 2024-04-04 | Discharge: 2024-04-04 | Disposition: A | Discharge status: Home / Self Care | Source: Ambulatory Visit | Attending: Family Medicine | Admitting: Family Medicine

## 2024-04-04 DIAGNOSIS — Z780 Asymptomatic menopausal state: Secondary | ICD-10-CM | POA: Insufficient documentation

## 2024-04-04 DIAGNOSIS — Z1231 Encounter for screening mammogram for malignant neoplasm of breast: Secondary | ICD-10-CM | POA: Diagnosis present

## 2024-04-07 ENCOUNTER — Ambulatory Visit: Payer: Self-pay | Admitting: Family Medicine

## 2024-04-08 LAB — COMPREHENSIVE METABOLIC PANEL WITH GFR
ALT: 11 IU/L (ref 0–32)
AST: 19 IU/L (ref 0–40)
Albumin: 4.6 g/dL (ref 3.8–4.8)
Alkaline Phosphatase: 106 IU/L (ref 49–135)
BUN/Creatinine Ratio: 23 (ref 12–28)
BUN: 27 mg/dL (ref 8–27)
Bilirubin Total: 0.8 mg/dL (ref 0.0–1.2)
CO2: 25 mmol/L (ref 20–29)
Calcium: 10.7 mg/dL — ABNORMAL HIGH (ref 8.7–10.3)
Chloride: 95 mmol/L — ABNORMAL LOW (ref 96–106)
Creatinine, Ser: 1.18 mg/dL — ABNORMAL HIGH (ref 0.57–1.00)
Globulin, Total: 2.7 g/dL (ref 1.5–4.5)
Glucose: 80 mg/dL (ref 70–99)
Potassium: 4.8 mmol/L (ref 3.5–5.2)
Sodium: 136 mmol/L (ref 134–144)
Total Protein: 7.3 g/dL (ref 6.0–8.5)
eGFR: 48 mL/min/1.73 — ABNORMAL LOW

## 2024-04-08 LAB — T4, FREE: Free T4: 0.89 ng/dL (ref 0.82–1.77)

## 2024-04-09 ENCOUNTER — Ambulatory Visit: Admitting: "Endocrinology

## 2024-04-10 ENCOUNTER — Ambulatory Visit: Admitting: "Endocrinology

## 2024-04-10 ENCOUNTER — Encounter: Payer: Self-pay | Admitting: "Endocrinology

## 2024-04-10 VITALS — BP 118/64 | HR 68 | Ht 65.0 in | Wt 128.0 lb

## 2024-04-10 DIAGNOSIS — E782 Mixed hyperlipidemia: Secondary | ICD-10-CM | POA: Diagnosis not present

## 2024-04-10 DIAGNOSIS — E063 Autoimmune thyroiditis: Secondary | ICD-10-CM | POA: Diagnosis not present

## 2024-04-10 DIAGNOSIS — R7303 Prediabetes: Secondary | ICD-10-CM

## 2024-04-10 DIAGNOSIS — E876 Hypokalemia: Secondary | ICD-10-CM

## 2024-04-10 MED ORDER — POTASSIUM CHLORIDE CRYS ER 20 MEQ PO TBCR: 20.0000 meq | EXTENDED_RELEASE_TABLET | Freq: Every day | ORAL | 1 refills | Status: AC

## 2024-04-10 MED ORDER — ROSUVASTATIN CALCIUM 20 MG PO TABS: 20.0000 mg | ORAL_TABLET | Freq: Every day | ORAL | 3 refills | Status: AC

## 2024-04-10 MED ORDER — UNITHROID 75 MCG PO TABS: 75.0000 ug | ORAL_TABLET | Freq: Every day | ORAL | 1 refills | Status: AC

## 2024-04-10 MED ORDER — VITAMIN D 50 MCG (2000 UT) PO CAPS: 1.0000 | ORAL_CAPSULE | Freq: Every day | ORAL | 3 refills | Status: AC

## 2024-04-10 NOTE — Progress Notes (Signed)
 "                                                 04/10/2024, 2:41 PM   Endocrinology follow-up note  Subjective:    Patient ID: Christina Carroll, female    DOB: 1945/12/17, PCP Antonetta Rollene BRAVO, MD   Past Medical History:  Diagnosis Date   Arthritis    BACK PAIN 02/02/2010   Qualifier: Diagnosis of  By: Antonetta MD, Margaret     Essential hypertension    Hyperlipidemia    Hypertension    Phreesia 07/05/2020   Hypothyroidism    Lymphocytic thyroiditis    Metabolic syndrome X 12/02/2013   NEVUS 05/12/2010   Qualifier: Diagnosis of  By: Antonetta MD, Margaret     Obesity    Prediabetes    Primary osteoarthritis, 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   TENDINITIS 02/02/2010   Qualifier: Diagnosis of  By: Antonetta MD, Margaret     Thyroid  disease    Phreesia 07/05/2020   Past Surgical History:  Procedure Laterality Date   Bilateral foot surgery bone removed  2000   COLONOSCOPY  11/30/2011   Procedure: COLONOSCOPY;  Surgeon: Claudis RAYMOND Rivet, MD;  Location: [facility];  Service: Endoscopy;  Laterality: N/A;  830   COLONOSCOPY WITH PROPOFOL  N/A 04/04/2022   Procedure: COLONOSCOPY WITH PROPOFOL ;  Surgeon: Eartha Angelia Sieving, MD;  Location: [facility];  Service: Gastroenterology;  Laterality: N/A;  1:45pm, asa 3   Lumpectomy removed from thoracic spine area  1999   POLYPECTOMY  04/04/2022   Procedure: POLYPECTOMY INTESTINAL;  Surgeon: Eartha Angelia Sieving, MD;  Location: [facility];  Service: Gastroenterology;;   TOTAL HIP ARTHROPLASTY Right 10/09/2023   Procedure: ARTHROPLASTY, HIP, TOTAL, ANTERIOR APPROACH;  Surgeon: Ernie Cough, MD;  Location: [facility];  Service: Orthopedics;  Laterality: Right;   Social History   Socioeconomic History   Marital status: Divorced    Spouse name: Not on file   Number of children: 1   Years of education: Not on file   Highest education level: Bachelor's degree (e.g., BA, AB, BS)  Occupational History   Occupation: Employed    Tobacco Use   Smoking status: Never   Smokeless tobacco: Never  Vaping Use   Vaping status: Never Used  Substance and Sexual Activity   Alcohol use: No    Alcohol/week: 0.0 standard drinks of alcohol   Drug use: No   Sexual activity: Not Currently  Other Topics Concern   Not on file  Social History Narrative   Right handed   Caffeine- none   Lives alone   Social Drivers of Health   Tobacco Use: Low Risk (04/10/2024)   Patient History    Smoking Tobacco Use: Never    Smokeless Tobacco Use: Never    Passive Exposure: Not on file  Financial Resource Strain: Low Risk (07/02/2022)   Overall Financial Resource Strain (CARDIA)    Difficulty of Paying Living Expenses: Not hard at all  Food Insecurity: No Food Insecurity (03/28/2024)   Epic    Worried About Programme Researcher, Broadcasting/film/video in the Last Year: Never true    Ran Out of Food in the Last Year: Never true  Transportation Needs: No Transportation Needs (03/28/2024)   Epic    Lack of Transportation (Medical): No    Lack of Transportation (Non-Medical): No  Physical Activity: Patient Declined (03/28/2024)   Exercise Vital Sign    Days of Exercise per Week: Patient declined    Minutes of Exercise per Session: Patient declined  Stress: No Stress Concern Present (03/28/2024)   [employer] of Occupational Health - Occupational Stress Questionnaire    Feeling of Stress: Not at all  Social Connections: Moderately Integrated (03/28/2024)   Social Connection and Isolation Panel    Frequency of Communication with Friends and Family: More than three times a week    Frequency of Social Gatherings with Friends and Family: More than three times a week    Attends Religious Services: More than 4 times per year    Active Member of Clubs or Organizations: Yes    Attends Banker Meetings: More than 4 times per year    Marital Status: Divorced  Depression (PHQ2-9): Low Risk (03/28/2024)   Depression (PHQ2-9)    PHQ-2 Score: 0  Alcohol  Screen: Not on file  Housing: Low Risk (03/28/2024)   Epic    Unable to Pay for Housing in the Last Year: No    Number of Times Moved in the Last Year: 0    Homeless in the Last Year: No  Utilities: Not At Risk (03/28/2024)   Epic    Threatened with loss of utilities: No  Health Literacy: Adequate Health Literacy (03/28/2024)   B1300 Health Literacy    Frequency of need for help with medical instructions: Never   Family History  Problem Relation Age of Onset   Stroke Mother    Diabetes Mother    Hypertension Mother    Coronary artery disease Father    Arthritis Other    Cancer Other    Breast cancer Maternal Aunt    Hypertension Son    Outpatient Encounter Medications as of 04/10/2024  Medication Sig   Ascorbic Acid (VITAMIN C PO) Take 1 tablet by mouth daily.   BLACK CURRANT SEED OIL PO Take 1 capsule by mouth daily.   Cholecalciferol (VITAMIN D ) 50 MCG (2000 UT) CAPS Take 1 capsule (2,000 Units total) by mouth daily.   CINNAMON PO Take 1 capsule by mouth daily.   donepezil  (ARICEPT ) 5 MG tablet Take 1 tablet (5 mg total) by mouth at bedtime.   LINIMENTS EX Apply 1 Application topically daily as needed (pain). Horse Liniment   NIFEdipine  (PROCARDIA  XL) 60 MG 24 hr tablet Take 1 tablet (60 mg total) by mouth daily.   potassium chloride  SA (KLOR-CON  M) 20 MEQ tablet Take 1 tablet (20 mEq total) by mouth daily with lunch.   rosuvastatin  (CRESTOR ) 20 MG tablet Take 1 tablet (20 mg total) by mouth daily.   triamterene -hydrochlorothiazide  (DYAZIDE ) 37.5-25 MG capsule Take 1 capsule by mouth daily.   UNITHROID  75 MCG tablet Take 1 tablet (75 mcg total) by mouth daily before breakfast.   [DISCONTINUED] Cholecalciferol (VITAMIN D ) 50 MCG (2000 UT) CAPS Take 1 capsule (2,000 Units total) by mouth daily.   [DISCONTINUED] potassium chloride  SA (KLOR-CON  M) 20 MEQ tablet Take 1 tablet (20 mEq total) by mouth daily with lunch for 3 days.   [DISCONTINUED] rosuvastatin  (CRESTOR ) 20 MG tablet Take 1  tablet (20 mg total) by mouth daily.   [DISCONTINUED] UNITHROID  75 MCG tablet Take 1 tablet (75 mcg total) by mouth daily before breakfast.   No facility-administered encounter medications on file as of 04/10/2024.   ALLERGIES: No Known Allergies  VACCINATION STATUS: Immunization History  Administered Date(s) Administered   Fluad Quad(high  Dose 65+) 11/11/2018, 01/05/2020, 12/09/2020, 11/30/2021, 11/25/2022   INFLUENZA, HIGH DOSE SEASONAL PF 12/21/2017, 01/25/2024   Influenza Split 02/21/2011, 01/31/2012   Influenza Whole 01/23/2007, 12/21/2008, 02/02/2010   Influenza,inj,Quad PF,6+ Mos 12/24/2013, 01/18/2015, 12/01/2015, 01/18/2017   MODERNA COVID-19 SARS-COV-2 PEDS BIVALENT BOOSTER 26yr-3yr 10/14/2021   Moderna Covid-19 Vaccine Bivalent Booster 26yrs & up 11/28/2022   Moderna SARS-COV2 Booster Vaccination 01/24/2020, 07/08/2020, 12/16/2020   Moderna Sars-Covid-2 Vaccination 04/24/2019, 05/23/2019   Pneumococcal Conjugate-13 09/22/2013   Pneumococcal Polysaccharide-23 04/03/2013   Td 04/10/2005   Tdap 06/29/2014   Zoster Recombinant(Shingrix ) 06/05/2017, 09/06/2017   Zoster, Live 04/19/2009    HPI Christina Carroll is 79 y.o. female who returns to clinic for follow-up.  She follows up for hypothyroidism due to Hashimoto's thyroiditis.  She is currently on Unithroid  75 mcg p.o. daily before breakfast.  Her previously documented hypokalemia is corrected. She has no new complaints today.   She did not bring her medications for reconciliation.  She remains on Dyazide  37.5-25 mg p.o. daily for hypertension, Crestor  20 mg p.o. nightly for hypercholesterolemia.  She has seen Dr. Christi in the past.    She denies prior history of thyroidectomy or thyroid  ablation.  She still drives around.   Review of Systems  Constitutional: +mildly fluctuating body weight, no fatigue, no subjective hyperthermia, no subjective hypothermia   Objective:       04/10/2024    2:00 PM 03/28/2024     1:50 PM 01/25/2024    2:24 PM  Vitals with BMI  Height 5' 5 5' 5 5' 5  Weight 128 lbs 135 lbs 1 oz 127 lbs  BMI 21.3 22.48 21.13  Systolic 118 123 871  Diastolic 64 79 80  Pulse 68 61 70    BP 118/64   Pulse 68   Ht 5' 5 (1.651 m)   Wt 128 lb (58.1 kg)   BMI 21.30 kg/m   Wt Readings from Last 3 Encounters:  04/10/24 128 lb (58.1 kg)  03/28/24 135 lb 1.3 oz (61.3 kg)  01/25/24 127 lb (57.6 kg)    Physical Exam  Constitutional:  Body mass index is 21.3 kg/m.,  not in acute distress, patient looks better today. Eyes: PERRLA, EOMI, no exophthalmos    CMP ( most recent) CMP     Component Value Date/Time   NA 136 04/07/2024 1406   K 4.8 04/07/2024 1406   CL 95 (L) 04/07/2024 1406   CO2 25 04/07/2024 1406   GLUCOSE 80 04/07/2024 1406   GLUCOSE 75 12/05/2023 0418   BUN 27 04/07/2024 1406   CREATININE 1.18 (H) 04/07/2024 1406   CREATININE 1.03 (H) 05/07/2019 0834   CALCIUM  10.7 (H) 04/07/2024 1406   PROT 7.3 04/07/2024 1406   ALBUMIN 4.6 04/07/2024 1406   AST 19 04/07/2024 1406   ALT 11 04/07/2024 1406   ALKPHOS 106 04/07/2024 1406   BILITOT 0.8 04/07/2024 1406   EGFR 48 (L) 04/07/2024 1406   GFRNONAA 44 (L) 12/05/2023 0418   GFRNONAA 55 (L) 05/07/2019 0834     Diabetic Labs (most recent): Lab Results  Component Value Date   HGBA1C 5.8 (H) 07/23/2023   HGBA1C 5.7 (H) 01/24/2023   HGBA1C 6.0 (H) 11/07/2022     Lipid Panel ( most recent) Lipid Panel     Component Value Date/Time   CHOL 224 (H) 07/23/2023 0908   TRIG 68 07/23/2023 0908   HDL 83 07/23/2023 0908   CHOLHDL 2.7 07/23/2023 0908   CHOLHDL 2.8 05/07/2019 9165  VLDL 12 08/11/2016 0818   LDLCALC 129 (H) 07/23/2023 0908   LDLCALC 115 (H) 05/07/2019 0834   LABVLDL 12 07/23/2023 0908      Lab Results  Component Value Date   TSH 2.970 01/25/2024   TSH 5.249 (H) 12/04/2023   TSH 2.750 06/04/2023   TSH 1.640 04/03/2023   TSH 2.500 11/07/2022   FREET4 0.89 04/07/2024   FREET4 0.87  01/25/2024   FREET4 1.01 06/04/2023   FREET4 1.33 04/03/2023     Bone density on Aug 16, 2021 [facility] Spine L1-L4 08/16/2021 73.8 Normal 2.3 1.456 g/cm2 13.2% Yes [facility] Spine L1-L4 06/13/2006 58.6 Normal 0.9 1.286 g/cm2 - -   DualFemur Total Left 08/16/2021 73.8 Normal -0.5 0.942 g/cm2 -4.9% Yes DualFemur Total Left 08/21/2018 70.8 Normal -0.1 0.991 g/cm2 1.7% -   DualFemur Total Mean 08/16/2021 73.8 Normal 0.0 1.004 g/cm2 -4.9% Yes DualFemur Total Mean 08/21/2018 70.8 - - 1.056 g/cm2 0.8% -    Left Forearm Radius 33% 08/16/2021 73.8 Normal -0.3 0.689 g/cm2 -3.1% -  ASSESSMENT: The BMD measured at Femur Total Left is 0.942 g/cm2 with a T-score of -0.5. This patient is considered normal according to World Health Organization [facility]) criteria. The scan quality is good. Compared with the prior study on 08/21/2018, the BMD of the total mean shows a statistically significant decrease.  Recent Results (from the past 2160 hours)  BMP8+EGFR     Status: Abnormal   Collection Time: 01/25/24  2:34 PM  Result Value Ref Range   Glucose 93 70 - 99 mg/dL   BUN 16 8 - 27 mg/dL   Creatinine, Ser 8.91 (H) 0.57 - 1.00 mg/dL   eGFR 53 (L) >40 fO/fpw/8.26   BUN/Creatinine Ratio 15 12 - 28   Sodium 140 134 - 144 mmol/L   Potassium 4.2 3.5 - 5.2 mmol/L   Chloride 99 96 - 106 mmol/L   CO2 26 20 - 29 mmol/L   Calcium  10.4 (H) 8.7 - 10.3 mg/dL  CBC     Status: Abnormal   Collection Time: 01/25/24  2:34 PM  Result Value Ref Range   WBC 5.7 3.4 - 10.8 x10E3/uL   RBC 4.68 3.77 - 5.28 x10E6/uL   Hemoglobin 12.4 11.1 - 15.9 g/dL   Hematocrit 60.9 65.9 - 46.6 %   MCV 83 79 - 97 fL   MCH 26.5 (L) 26.6 - 33.0 pg   MCHC 31.8 31.5 - 35.7 g/dL   RDW 84.6 88.2 - 84.5 %   Platelets 359 150 - 450 x10E3/uL  TSH + free T4     Status: None   Collection Time: 01/25/24  2:34 PM  Result Value Ref Range   TSH 2.970 0.450 - 4.500 uIU/mL   Free T4 0.87 0.82 - 1.77 ng/dL  Comprehensive metabolic panel with GFR      Status: Abnormal   Collection Time: 04/07/24  2:06 PM  Result Value Ref Range   Glucose 80 70 - 99 mg/dL   BUN 27 8 - 27 mg/dL   Creatinine, Ser 8.81 (H) 0.57 - 1.00 mg/dL   eGFR 48 (L) >40 fO/fpw/8.26   BUN/Creatinine Ratio 23 12 - 28   Sodium 136 134 - 144 mmol/L   Potassium 4.8 3.5 - 5.2 mmol/L   Chloride 95 (L) 96 - 106 mmol/L   CO2 25 20 - 29 mmol/L   Calcium  10.7 (H) 8.7 - 10.3 mg/dL   Total Protein 7.3 6.0 - 8.5 g/dL   Albumin 4.6 3.8 - 4.8  g/dL   Globulin, Total 2.7 1.5 - 4.5 g/dL   Bilirubin Total 0.8 0.0 - 1.2 mg/dL   Alkaline Phosphatase 106 49 - 135 IU/L   AST 19 0 - 40 IU/L   ALT 11 0 - 32 IU/L  T4, free     Status: None   Collection Time: 04/07/24  2:06 PM  Result Value Ref Range   Free T4 0.89 0.82 - 1.77 ng/dL     Assessment & Plan:   1. Hypothyroidism-Hashimoto's thyroiditis 2.  Hypokalemia   3.  Prediabetes 4.  Hypercalcemia  Her previsit thyroid  function test are consistent with appropriate replacement.  She is advised to continue Unithroid  75 mcg p.o. daily before breakfast.    - We discussed about the correct intake of her thyroid  hormone, on empty stomach at fasting, with water , separated by at least 30 minutes from breakfast and other medications,  and separated by more than 4 hours from calcium , iron, multivitamins, acid reflux medications (PPIs). -Patient is made aware of the fact that thyroid  hormone replacement is needed for life, dose to be adjusted by periodic monitoring of thyroid  function tests.  Her recent labs showed resolution of her previous mild hypokalemia, and continued to have mild hypercalcemia.  She would not need intervention for the mild hypercalcemia at this time. Her previous 24-hour urine calcium  was only 14 mg / 24 hours.  She is not a surgical candidate for hypercalcemia.  FHH is not ruled out.  She will be continued on expectant management.   She is advised to continue potassium chloride  20 mEq p.o. daily at lunch. I advised her  to be consistent with her blood pressure medication triamterene -HCTZ 37.5-25 mg p.o. once a day.  She is encouraged to bring all of her medications during her next visit for reconciliation. She will not need intervention for prediabetes at this time-last A1c was 5.8%. She is advised to maintain close follow-up with her PMD.   she is advised to maintain close follow up with Antonetta Rollene BRAVO, MD for primary care needs.  I spent  25  minutes in the care of the patient today including review of labs from Thyroid  Function, CMP, and other relevant labs ; imaging/biopsy records (current and previous including abstractions from other facilities); face-to-face time discussing  her lab results and symptoms, medications doses, her options of short and long term treatment based on the latest standards of care / guidelines;   and documenting the encounter.  Christina Carroll  participated in the discussions, expressed understanding, and voiced agreement with the above plans.  All questions were answered to her satisfaction. she is encouraged to contact clinic should she have any questions or concerns prior to her return visit. Dear Patient: Feel free to review your progress notes.  If you are reviewing this progress note and have questions about the meaning of /or medical terms being used, please make a note and address it at your next follow-up appointment.  Medical notes are meant to be a communication tool between medical professionals and require medical terms to be used for efficiency and insurance approval.   -  This note was partially dictated with voice recognition software. Similar sounding words can be transcribed inadequately or may not  be corrected upon review.   Follow up plan: Return in about 6 months (around 10/08/2024) for Fasting Labs  in AM B4 8.   Ranny Earl, MD [facility] [address] [place], [place]  [address] Phone:  [phone]  Fax: [phone]     04/10/2024, 2:41 PM  This note was partially dictated with voice recognition software. Similar sounding words can be transcribed inadequately or may not  be corrected upon review.  "

## 2024-04-18 ENCOUNTER — Ambulatory Visit: Admitting: Family Medicine

## 2024-10-09 ENCOUNTER — Ambulatory Visit: Admitting: "Endocrinology

## 2025-03-31 ENCOUNTER — Ambulatory Visit: Payer: Self-pay
